# Patient Record
Sex: Female | Born: 1940 | Race: White | Hispanic: No | Smoking: Former smoker
Health system: Southern US, Community
[De-identification: ages and names within clinical notes are randomized; demographics above are authoritative.]

## PROBLEM LIST (undated history)

## (undated) DIAGNOSIS — S82899A Other fracture of unspecified lower leg, initial encounter for closed fracture: Secondary | ICD-10-CM

## (undated) DIAGNOSIS — M1990 Unspecified osteoarthritis, unspecified site: Secondary | ICD-10-CM

## (undated) DIAGNOSIS — F32A Depression, unspecified: Secondary | ICD-10-CM

## (undated) DIAGNOSIS — E079 Disorder of thyroid, unspecified: Secondary | ICD-10-CM

## (undated) DIAGNOSIS — E669 Obesity, unspecified: Secondary | ICD-10-CM

## (undated) DIAGNOSIS — H269 Unspecified cataract: Secondary | ICD-10-CM

## (undated) DIAGNOSIS — I10 Essential (primary) hypertension: Secondary | ICD-10-CM

## (undated) DIAGNOSIS — D509 Iron deficiency anemia, unspecified: Secondary | ICD-10-CM

## (undated) DIAGNOSIS — C50919 Malignant neoplasm of unspecified site of unspecified female breast: Secondary | ICD-10-CM

## (undated) DIAGNOSIS — R7303 Prediabetes: Principal | ICD-10-CM

## (undated) DIAGNOSIS — G4733 Obstructive sleep apnea (adult) (pediatric): Secondary | ICD-10-CM

## (undated) DIAGNOSIS — M810 Age-related osteoporosis without current pathological fracture: Secondary | ICD-10-CM

## (undated) DIAGNOSIS — R0600 Dyspnea, unspecified: Secondary | ICD-10-CM

## (undated) DIAGNOSIS — F419 Anxiety disorder, unspecified: Secondary | ICD-10-CM

## (undated) DIAGNOSIS — R0609 Other forms of dyspnea: Secondary | ICD-10-CM

## (undated) DIAGNOSIS — M8580 Other specified disorders of bone density and structure, unspecified site: Secondary | ICD-10-CM

## (undated) DIAGNOSIS — T7840XA Allergy, unspecified, initial encounter: Secondary | ICD-10-CM

## (undated) DIAGNOSIS — K219 Gastro-esophageal reflux disease without esophagitis: Secondary | ICD-10-CM

## (undated) HISTORY — PX: COLONOSCOPY W/ POLYPECTOMY: SHX1380

## (undated) HISTORY — DX: Obesity, unspecified: E66.9

## (undated) HISTORY — DX: Age-related osteoporosis without current pathological fracture: M81.0

## (undated) HISTORY — PX: BREAST BIOPSY: SHX20

## (undated) HISTORY — DX: Essential (primary) hypertension: I10

## (undated) HISTORY — DX: Allergy, unspecified, initial encounter: T78.40XA

## (undated) HISTORY — DX: Prediabetes: R73.03

## (undated) HISTORY — DX: Depression, unspecified: F32.A

## (undated) HISTORY — DX: Other specified disorders of bone density and structure, unspecified site: M85.80

## (undated) HISTORY — DX: Unspecified cataract: H26.9

## (undated) HISTORY — DX: Anxiety disorder, unspecified: F41.9

## (undated) HISTORY — PX: JOINT REPLACEMENT: SHX530

## (undated) HISTORY — DX: Obstructive sleep apnea (adult) (pediatric): G47.33

## (undated) HISTORY — PX: FRACTURE SURGERY: SHX138

## (undated) HISTORY — DX: Unspecified osteoarthritis, unspecified site: M19.90

## (undated) HISTORY — PX: MASTECTOMY: SHX3

---
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
## 2005-10-02 ENCOUNTER — Encounter: Payer: Self-pay | Admitting: Internal Medicine

## 2006-04-06 ENCOUNTER — Ambulatory Visit (HOSPITAL_COMMUNITY): Admission: RE | Admit: 2006-04-06 | Discharge: 2006-04-06 | Payer: Self-pay | Admitting: Internal Medicine

## 2006-04-06 ENCOUNTER — Ambulatory Visit: Payer: Self-pay | Admitting: Internal Medicine

## 2006-04-09 HISTORY — PX: TOTAL HIP ARTHROPLASTY: SHX124

## 2006-04-13 ENCOUNTER — Ambulatory Visit: Payer: Self-pay | Admitting: Internal Medicine

## 2006-05-03 ENCOUNTER — Inpatient Hospital Stay (HOSPITAL_COMMUNITY): Admission: RE | Admit: 2006-05-03 | Discharge: 2006-05-07 | Payer: Self-pay | Admitting: Orthopedic Surgery

## 2006-05-03 ENCOUNTER — Encounter (INDEPENDENT_AMBULATORY_CARE_PROVIDER_SITE_OTHER): Payer: Self-pay | Admitting: Specialist

## 2006-12-11 ENCOUNTER — Ambulatory Visit: Payer: Self-pay | Admitting: Internal Medicine

## 2006-12-13 ENCOUNTER — Ambulatory Visit: Payer: Self-pay | Admitting: Internal Medicine

## 2006-12-13 LAB — CONVERTED CEMR LAB
ALT: 20 U/L
AST: 22 U/L
Albumin: 3.5 g/dL
Alkaline Phosphatase: 56 U/L
BUN: 19 mg/dL
Basophils Absolute: 0.1 K/uL
Basophils Relative: 0.9 %
Bilirubin, Direct: 0.1 mg/dL
CO2: 28 meq/L
Calcium: 10.3 mg/dL
Chloride: 109 meq/L
Cholesterol: 203 mg/dL
Creatinine, Ser: 1 mg/dL
Direct LDL: 110.4 mg/dL
Eosinophils Absolute: 0.1 K/uL
Eosinophils Relative: 1.6 %
GFR calc Af Amer: 72 mL/min
GFR calc non Af Amer: 60 mL/min
Glucose, Bld: 114 mg/dL — ABNORMAL HIGH
HCT: 35.4 % — ABNORMAL LOW
HDL: 61 mg/dL
Hemoglobin: 12.4 g/dL
Hgb A1c MFr Bld: 5.9 %
Lymphocytes Relative: 30.5 %
MCHC: 35 g/dL
MCV: 94.7 fL
Monocytes Absolute: 0.6 K/uL
Monocytes Relative: 6.1 %
Neutro Abs: 5.5 K/uL
Neutrophils Relative %: 60.9 %
Platelets: 268 K/uL
Potassium: 4 meq/L
RBC: 3.74 M/uL — ABNORMAL LOW
RDW: 11.8 %
Sodium: 142 meq/L
TSH: 1.41 u[IU]/mL
Total Bilirubin: 0.9 mg/dL
Total CHOL/HDL Ratio: 3.3
Total Protein: 6.8 g/dL
Triglycerides: 97 mg/dL
VLDL: 19 mg/dL
WBC: 9.1 10*3/microliter

## 2007-01-22 ENCOUNTER — Ambulatory Visit: Payer: Self-pay | Admitting: Internal Medicine

## 2007-01-22 LAB — CONVERTED CEMR LAB
CO2: 26 meq/L (ref 19–32)
GFR calc Af Amer: 81 mL/min
GFR calc non Af Amer: 67 mL/min
Glucose, Bld: 103 mg/dL — ABNORMAL HIGH (ref 70–99)
Potassium: 4.3 meq/L (ref 3.5–5.1)

## 2007-12-03 ENCOUNTER — Ambulatory Visit: Payer: Self-pay | Admitting: Internal Medicine

## 2007-12-03 LAB — CONVERTED CEMR LAB
AST: 25 units/L (ref 0–37)
Albumin: 3.4 g/dL — ABNORMAL LOW (ref 3.5–5.2)
Alkaline Phosphatase: 70 units/L (ref 39–117)
BUN: 16 mg/dL (ref 6–23)
Basophils Absolute: 0.1 10*3/uL (ref 0.0–0.1)
Chloride: 109 meq/L (ref 96–112)
Creatinine, Ser: 1 mg/dL (ref 0.4–1.2)
Eosinophils Absolute: 0.1 10*3/uL (ref 0.0–0.7)
GFR calc Af Amer: 72 mL/min
GFR calc non Af Amer: 59 mL/min
HCT: 34.8 % — ABNORMAL LOW (ref 36.0–46.0)
Hemoglobin, Urine: NEGATIVE
MCHC: 32.2 g/dL (ref 30.0–36.0)
MCV: 94.1 fL (ref 78.0–100.0)
Monocytes Absolute: 0.5 10*3/uL (ref 0.1–1.0)
Neutrophils Relative %: 64.5 % (ref 43.0–77.0)
Platelets: 337 10*3/uL (ref 150–400)
Potassium: 4.4 meq/L (ref 3.5–5.1)
Specific Gravity, Urine: 1.01 (ref 1.000–1.03)
TSH: 1.34 microintl units/mL (ref 0.35–5.50)
Total Bilirubin: 0.8 mg/dL (ref 0.3–1.2)
Total CHOL/HDL Ratio: 3.5
Urobilinogen, UA: 0.2 (ref 0.0–1.0)
pH: 6 (ref 5.0–8.0)

## 2007-12-13 ENCOUNTER — Telehealth: Payer: Self-pay | Admitting: Internal Medicine

## 2007-12-13 ENCOUNTER — Ambulatory Visit: Payer: Self-pay | Admitting: Internal Medicine

## 2007-12-13 DIAGNOSIS — I10 Essential (primary) hypertension: Secondary | ICD-10-CM | POA: Insufficient documentation

## 2008-01-14 ENCOUNTER — Ambulatory Visit: Payer: Self-pay | Admitting: Internal Medicine

## 2008-01-14 LAB — CONVERTED CEMR LAB
ALT: 22 units/L (ref 0–35)
AST: 20 units/L (ref 0–37)
Total CK: 58 units/L (ref 7–177)

## 2008-01-24 ENCOUNTER — Ambulatory Visit: Payer: Self-pay | Admitting: Internal Medicine

## 2008-01-24 DIAGNOSIS — E785 Hyperlipidemia, unspecified: Secondary | ICD-10-CM | POA: Insufficient documentation

## 2008-01-24 DIAGNOSIS — D649 Anemia, unspecified: Secondary | ICD-10-CM | POA: Insufficient documentation

## 2008-01-24 LAB — CONVERTED CEMR LAB
Basophils Relative: 1.2 % (ref 0.0–3.0)
Eosinophils Absolute: 0.1 10*3/uL (ref 0.0–0.7)
Eosinophils Relative: 1 % (ref 0.0–5.0)
HCT: 36.3 % (ref 36.0–46.0)
HDL: 57.1 mg/dL (ref >39.0)
Hemoglobin: 12.3 g/dL (ref 12.0–15.0)
MCV: 93.3 fL (ref 78.0–100.0)
Monocytes Absolute: 0.8 10*3/uL (ref 0.1–1.0)
Monocytes Relative: 6.8 % (ref 3.0–12.0)
Neutro Abs: 7.4 10*3/uL (ref 1.4–7.7)
Total CHOL/HDL Ratio: 2.8
WBC: 11.1 10*3/uL — ABNORMAL HIGH (ref 4.5–10.5)

## 2008-05-26 ENCOUNTER — Ambulatory Visit: Payer: Self-pay | Admitting: Internal Medicine

## 2008-09-10 ENCOUNTER — Ambulatory Visit: Payer: Self-pay | Admitting: Internal Medicine

## 2008-09-10 LAB — CONVERTED CEMR LAB
AST: 21 units/L (ref 0–37)
BUN: 21 mg/dL (ref 6–23)
Chloride: 107 meq/L (ref 96–112)
Cholesterol: 147 mg/dL (ref 0–200)
GFR calc Af Amer: 81 mL/min
GFR calc non Af Amer: 67 mL/min
LDL Cholesterol: 76 mg/dL (ref 0–99)
Potassium: 4.3 meq/L (ref 3.5–5.1)
Sodium: 140 meq/L (ref 135–145)
TSH: 0.96 microintl units/mL (ref 0.35–5.50)
Total CHOL/HDL Ratio: 2.5
VLDL: 12 mg/dL (ref 0–40)

## 2008-09-24 ENCOUNTER — Ambulatory Visit: Payer: Self-pay | Admitting: Internal Medicine

## 2008-09-24 DIAGNOSIS — J309 Allergic rhinitis, unspecified: Secondary | ICD-10-CM | POA: Insufficient documentation

## 2008-09-24 DIAGNOSIS — Z853 Personal history of malignant neoplasm of breast: Secondary | ICD-10-CM | POA: Insufficient documentation

## 2008-10-02 ENCOUNTER — Ambulatory Visit: Payer: Self-pay | Admitting: Hematology & Oncology

## 2008-12-29 LAB — CONVERTED CEMR LAB: Pap Smear: NORMAL

## 2009-01-15 ENCOUNTER — Ambulatory Visit: Payer: Self-pay | Admitting: Hematology & Oncology

## 2009-01-18 ENCOUNTER — Encounter: Payer: Self-pay | Admitting: Internal Medicine

## 2009-01-18 LAB — CBC WITH DIFFERENTIAL (CANCER CENTER ONLY)
Eosinophils Absolute: 0.1 10*3/uL (ref 0.0–0.5)
LYMPH%: 34.4 % (ref 14.0–48.0)
MCH: 31.8 pg (ref 26.0–34.0)
MCV: 95 fL (ref 81–101)
MONO%: 6.4 % (ref 0.0–13.0)
Platelets: 317 10*3/uL (ref 145–400)
RBC: 3.41 10*6/uL — ABNORMAL LOW (ref 3.70–5.32)

## 2009-01-19 LAB — COMPREHENSIVE METABOLIC PANEL
Albumin: 4.1 g/dL (ref 3.5–5.2)
BUN: 19 mg/dL (ref 6–23)
CO2: 23 mEq/L (ref 19–32)
Calcium: 10.1 mg/dL (ref 8.4–10.5)
Chloride: 106 mEq/L (ref 96–112)
Glucose, Bld: 111 mg/dL — ABNORMAL HIGH (ref 70–99)
Potassium: 3.6 mEq/L (ref 3.5–5.3)
Total Protein: 6.7 g/dL (ref 6.0–8.3)

## 2009-01-19 LAB — VITAMIN D 25 HYDROXY (VIT D DEFICIENCY, FRACTURES): Vit D, 25-Hydroxy: 47 ng/mL (ref 30–89)

## 2009-01-22 ENCOUNTER — Ambulatory Visit (HOSPITAL_COMMUNITY): Admission: RE | Admit: 2009-01-22 | Discharge: 2009-01-22 | Payer: Self-pay | Admitting: Hematology & Oncology

## 2009-02-05 ENCOUNTER — Ambulatory Visit: Payer: Self-pay | Admitting: Internal Medicine

## 2009-03-16 ENCOUNTER — Telehealth: Payer: Self-pay | Admitting: Internal Medicine

## 2009-03-23 ENCOUNTER — Ambulatory Visit: Payer: Self-pay | Admitting: Hematology & Oncology

## 2009-03-24 ENCOUNTER — Encounter: Payer: Self-pay | Admitting: Internal Medicine

## 2009-03-24 LAB — CBC WITH DIFFERENTIAL (CANCER CENTER ONLY)
BASO%: 0.7 % (ref 0.0–2.0)
EOS%: 1.4 % (ref 0.0–7.0)
HCT: 35.3 % (ref 34.8–46.6)
HGB: 11.9 g/dL (ref 11.6–15.9)
LYMPH#: 2.9 10*3/uL (ref 0.9–3.3)
MCHC: 33.6 g/dL (ref 32.0–36.0)
MONO#: 0.6 10*3/uL (ref 0.1–0.9)
NEUT#: 6.6 10*3/uL — ABNORMAL HIGH (ref 1.5–6.5)
NEUT%: 64.3 % (ref 39.6–80.0)
RDW: 11.3 % (ref 10.5–14.6)
WBC: 10.3 10*3/uL — ABNORMAL HIGH (ref 3.9–10.0)

## 2009-03-24 LAB — CHCC SATELLITE - SMEAR

## 2009-03-26 LAB — PROTEIN ELECTROPHORESIS, SERUM
Alpha-1-Globulin: 4.6 % (ref 2.9–4.9)
Alpha-2-Globulin: 12.2 % — ABNORMAL HIGH (ref 7.1–11.8)
Beta 2: 4.7 % (ref 3.2–6.5)
Beta Globulin: 6 % (ref 4.7–7.2)
Gamma Globulin: 15.9 % (ref 11.1–18.8)

## 2009-03-26 LAB — ERYTHROPOIETIN: Erythropoietin: 8.7 m[IU]/mL (ref 2.6–34.0)

## 2009-03-26 LAB — RETICULOCYTES (CHCC): Retic Ct Pct: 1.2 % (ref 0.4–3.1)

## 2009-05-31 ENCOUNTER — Telehealth: Payer: Self-pay | Admitting: Internal Medicine

## 2009-06-29 ENCOUNTER — Ambulatory Visit: Payer: Self-pay | Admitting: Hematology & Oncology

## 2009-07-01 ENCOUNTER — Encounter: Payer: Self-pay | Admitting: Internal Medicine

## 2009-07-01 LAB — CBC WITH DIFFERENTIAL (CANCER CENTER ONLY)
BASO%: 0.5 % (ref 0.0–2.0)
EOS%: 1.6 % (ref 0.0–7.0)
LYMPH#: 2.9 10*3/uL (ref 0.9–3.3)
LYMPH%: 31.3 % (ref 14.0–48.0)
MCHC: 34.4 g/dL (ref 32.0–36.0)
MONO#: 0.6 10*3/uL (ref 0.1–0.9)
Platelets: 304 10*3/uL (ref 145–400)
RDW: 11.6 % (ref 10.5–14.6)
WBC: 9.4 10*3/uL (ref 3.9–10.0)

## 2009-07-01 LAB — COMPREHENSIVE METABOLIC PANEL
AST: 22 U/L (ref 0–37)
Albumin: 4 g/dL (ref 3.5–5.2)
BUN: 25 mg/dL — ABNORMAL HIGH (ref 6–23)
Calcium: 10.5 mg/dL (ref 8.4–10.5)
Chloride: 106 mEq/L (ref 96–112)
Creatinine, Ser: 1.04 mg/dL (ref 0.40–1.20)
Glucose, Bld: 105 mg/dL — ABNORMAL HIGH (ref 70–99)
Potassium: 4.2 mEq/L (ref 3.5–5.3)

## 2009-07-01 LAB — VITAMIN D 25 HYDROXY (VIT D DEFICIENCY, FRACTURES): Vit D, 25-Hydroxy: 44 ng/mL (ref 30–89)

## 2009-07-07 ENCOUNTER — Telehealth: Payer: Self-pay | Admitting: Internal Medicine

## 2009-07-10 HISTORY — PX: EYE SURGERY: SHX253

## 2009-11-26 ENCOUNTER — Ambulatory Visit: Payer: Self-pay | Admitting: Internal Medicine

## 2009-11-26 DIAGNOSIS — M8580 Other specified disorders of bone density and structure, unspecified site: Secondary | ICD-10-CM

## 2009-11-26 HISTORY — DX: Other specified disorders of bone density and structure, unspecified site: M85.80

## 2009-11-26 LAB — CONVERTED CEMR LAB
AST: 21 units/L (ref 0–37)
Alkaline Phosphatase: 56 units/L (ref 39–117)
BUN: 27 mg/dL — ABNORMAL HIGH (ref 6–23)
Calcium: 10.6 mg/dL — ABNORMAL HIGH (ref 8.4–10.5)
Chloride: 105 meq/L (ref 96–112)
Creatinine, Ser: 1.16 mg/dL (ref 0.40–1.20)
Indirect Bilirubin: 0.6 mg/dL (ref 0.0–0.9)
LDL Cholesterol: 73 mg/dL (ref 0–99)
Total Bilirubin: 0.8 mg/dL (ref 0.3–1.2)
Total Protein: 7.4 g/dL (ref 6.0–8.3)
Triglycerides: 69 mg/dL (ref <150)

## 2009-11-29 ENCOUNTER — Encounter: Payer: Self-pay | Admitting: Internal Medicine

## 2009-11-29 ENCOUNTER — Telehealth: Payer: Self-pay | Admitting: Internal Medicine

## 2009-12-03 ENCOUNTER — Encounter: Payer: Self-pay | Admitting: Internal Medicine

## 2009-12-14 ENCOUNTER — Ambulatory Visit: Payer: Self-pay | Admitting: Hematology & Oncology

## 2009-12-17 ENCOUNTER — Encounter: Payer: Self-pay | Admitting: Internal Medicine

## 2009-12-17 LAB — COMPREHENSIVE METABOLIC PANEL
BUN: 17 mg/dL (ref 6–23)
CO2: 21 mEq/L (ref 19–32)
Creatinine, Ser: 0.95 mg/dL (ref 0.40–1.20)
Glucose, Bld: 102 mg/dL — ABNORMAL HIGH (ref 70–99)
Total Bilirubin: 0.6 mg/dL (ref 0.3–1.2)
Total Protein: 7.1 g/dL (ref 6.0–8.3)

## 2009-12-17 LAB — CBC WITH DIFFERENTIAL (CANCER CENTER ONLY)
BASO%: 0.8 % (ref 0.0–2.0)
EOS%: 2.2 % (ref 0.0–7.0)
LYMPH%: 27.4 % (ref 14.0–48.0)
MCH: 31.8 pg (ref 26.0–34.0)
MCV: 94 fL (ref 81–101)
MONO%: 6.4 % (ref 0.0–13.0)
Platelets: 323 10*3/uL (ref 145–400)
RDW: 11.1 % (ref 10.5–14.6)

## 2009-12-17 LAB — FERRITIN: Ferritin: 154 ng/mL (ref 10–291)

## 2009-12-17 LAB — VITAMIN D 25 HYDROXY (VIT D DEFICIENCY, FRACTURES): Vit D, 25-Hydroxy: 62 ng/mL (ref 30–89)

## 2009-12-17 LAB — CHCC SATELLITE - SMEAR

## 2010-02-02 ENCOUNTER — Encounter: Payer: Self-pay | Admitting: Internal Medicine

## 2010-02-02 LAB — CONVERTED CEMR LAB
BUN: 19 mg/dL (ref 6–23)
Chloride: 104 meq/L (ref 96–112)
Creatinine, Ser: 0.93 mg/dL (ref 0.40–1.20)
Glucose, Bld: 110 mg/dL — ABNORMAL HIGH (ref 70–99)
Potassium: 4.3 meq/L (ref 3.5–5.3)

## 2010-02-07 ENCOUNTER — Encounter: Payer: Self-pay | Admitting: Internal Medicine

## 2010-02-09 ENCOUNTER — Encounter: Payer: Self-pay | Admitting: Internal Medicine

## 2010-02-10 ENCOUNTER — Encounter: Payer: Self-pay | Admitting: Internal Medicine

## 2010-06-16 ENCOUNTER — Ambulatory Visit: Payer: Self-pay | Admitting: Hematology & Oncology

## 2010-06-17 ENCOUNTER — Encounter: Payer: Self-pay | Admitting: Internal Medicine

## 2010-06-17 LAB — CBC WITH DIFFERENTIAL (CANCER CENTER ONLY)
BASO#: 0.1 10*3/uL (ref 0.0–0.2)
BASO%: 0.7 % (ref 0.0–2.0)
EOS%: 1.5 % (ref 0.0–7.0)
HGB: 12.1 g/dL (ref 11.6–15.9)
LYMPH#: 2.6 10*3/uL (ref 0.9–3.3)
MCHC: 33.4 g/dL (ref 32.0–36.0)
NEUT#: 5.5 10*3/uL (ref 1.5–6.5)

## 2010-06-18 LAB — COMPREHENSIVE METABOLIC PANEL
ALT: 17 U/L (ref 0–35)
AST: 22 U/L (ref 0–37)
Albumin: 4.1 g/dL (ref 3.5–5.2)
Alkaline Phosphatase: 62 U/L (ref 39–117)
Glucose, Bld: 95 mg/dL (ref 70–99)
Potassium: 3.9 mEq/L (ref 3.5–5.3)
Sodium: 136 mEq/L (ref 135–145)
Total Protein: 6.9 g/dL (ref 6.0–8.3)

## 2010-07-27 ENCOUNTER — Telehealth: Payer: Self-pay | Admitting: Internal Medicine

## 2010-08-02 ENCOUNTER — Encounter: Payer: Self-pay | Admitting: Internal Medicine

## 2010-08-09 NOTE — Progress Notes (Signed)
Summary: Lab Results  Phone Note Outgoing Call   Summary of Call: call pt - calcium level slightly elevated.  I suggest we repeat bmet in 2 months Initial call taken by: D. [name] DO,  Nov 29, 2009 6:10 PM  Follow-up for Phone Call        attempted to contact patient at [phone], no answer, voice message left for patient to return call Follow-up by: Glendell Docker CMA,  Nov 30, 2009 8:37 AM  Additional Follow-up for Phone Call Additional follow up Details #1::        Also, received refill request from [facility] for Benicar HCT.  This was replaced with Losartan-HCTZ on 11/26/09.  Form faxed to [facility] to D/C Benicar HCT. Verify w/pt that they should not take Benicar HCT.  Left message on machine to return my call.  Mervin Kung CMA  Nov 30, 2009 1:33 PM   New Problems: HYPERCALCEMIA (ICD-275.42)   Additional Follow-up for Phone Call Additional follow up Details #2::    attempted to contact patient at  [phone] , no answer, voice message left for patient to return call regarding lab results. Follow-up by: Glendell Docker CMA,  Dec 01, 2009 9:08 AM  Additional Follow-up for Phone Call Additional follow up Details #3:: Details for Additional Follow-up Action Taken: call placed to patient at [phone], no answer, detaied voice message left for patient informing patient per Dr Artist Pais instructions Additional Follow-up by: Glendell Docker CMA,  Dec 03, 2009 10:10 AM  New Problems: HYPERCALCEMIA (ICD-275.42)  Appended Document: Lab Results Pt returned our call and was notified per Dr. Olegario Messier instructions.  Pt verified that Benicar HCT has been changed. Pt aware to come to [facility] office for blood work in 2 months.  Order sent to lab.

## 2010-08-09 NOTE — Letter (Signed)
[facility]   Imported By: Lanelle Bal 07/28/2009 11:04:40  _____________________________________________________________________  External Attachment:    Type:   Image     Comment:   External Document

## 2010-08-09 NOTE — Medication Information (Signed)
Summary: Simvasatin Change/[facility]  Simvasatin Change/[facility]   Imported By: Lanelle Bal 03/02/2010 09:48:40  _____________________________________________________________________  External Attachment:    Type:   Image     Comment:   External Document

## 2010-08-09 NOTE — Miscellaneous (Signed)
Summary: BMP ORDER  Clinical Lists Changes  Orders: Added new Test order of T-Basic Metabolic Panel [phone]) - Signed

## 2010-08-09 NOTE — Medication Information (Signed)
Summary: Drug Interaction Review-Simvastatin with Amlodipine  Drug Interaction Review-Simvastatin with Amlodipine   Imported By: Maryln Gottron 02/15/2010 12:13:33  _____________________________________________________________________  External Attachment:    Type:   Image     Comment:   External Document

## 2010-08-09 NOTE — Letter (Signed)
[facility]   Imported By: Lanelle Bal 01/07/2010 08:11:09  _____________________________________________________________________  External Attachment:    Type:   Image     Comment:   External Document

## 2010-08-09 NOTE — Miscellaneous (Signed)
Summary: Medication Change  Clinical Lists Changes  Medications: Changed medication from SIMVASTATIN 40 MG TABS (SIMVASTATIN) one by mouth qpm to SIMVASTATIN 10 MG TABS (SIMVASTATIN) Take 1 tablet by mouth once a day - Signed Rx of SIMVASTATIN 10 MG TABS (SIMVASTATIN) Take 1 tablet by mouth once a day;  #90 x 3;  Signed;  Entered by: Glendell Docker CMA;  Authorized by: D. [name] DO;  Method used: Telephoned to [facility]*, , ,   , Ph: [phone], Fax: [phone]    Prescriptions: SIMVASTATIN 10 MG TABS (SIMVASTATIN) Take 1 tablet by mouth once a day  #90 x 3   Entered by:   Glendell Docker CMA   Authorized by:   D. [name] DO   Signed by:   Glendell Docker CMA on 02/09/2010   Method used:   Telephoned to ...       [facility]* (retail)             ,          Ph: [phone]       Fax: [phone]   RxID:   [id number]

## 2010-08-09 NOTE — Assessment & Plan Note (Signed)
Summary: Follow up/DT   Vital Signs:  Patient profile:   70 year old female Height:      67 inches Weight:      235 pounds BMI:     36.94 O2 Sat:      99 % on Room air Temp:     97.6 degrees F oral Pulse rate:   62 / minute Pulse rhythm:   regular Resp:     18 per minute BP sitting:   122 / 76  (left arm) Cuff size:   large  Vitals Entered By: Glendell Docker CMA (Nov 26, 2009 8:44 AM)  O2 Flow:  Room air  Contraindications/Deferment of Procedures/Staging:    Test/Procedure: Mammogram    Reason for deferment: bilateral mastectomy  CC: Rm 3- follow up   Primary Care Provider:  Dondra Spry DO  CC:  Rm 3- follow up.  History of Present Illness: 70 y/o white female with hx of obesity, htn, breast cancer for routine follow up no significant interval hx she is followed by Dr. Myna Hidalgo  some weight gain it has been tough year since her husband was diagnosed with lymphoma  htn - stable.  would like to switch to generics if possible  Preventive Screening-Counseling & Management  Alcohol-Tobacco     Smoking Status: quit  Allergies: 1)  ! 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  Had a history of CHF, diabetes and alcoholism.  Mother deceased at age 31 secondary to breast cancer.       Social History: Retired The patient is married and has one child and two grandchildren that live in the area.   She drinks wine with dinner.  She quit smoking in 1976, smoked for approximately 10 years.     Moved to [place] from [place], [place]  Review of Systems       The patient complains of weight gain.  The patient denies chest pain, syncope, dyspnea on exertion, abdominal pain, melena, hematochezia, and severe indigestion/heartburn.     Physical Exam  General:  alert, obese Eyes:  pupils equal, pupils round, and pupils reactive to light.   Ears:  R ear normal and L ear normal.   Mouth:  pharynx pink and moist.   Neck:  supple and no masses.  no carotid bruits.   Lungs:  normal respiratory effort, normal breath sounds, no crackles, and no wheezes.   Heart:  normal rate, regular rhythm, and no gallop.   Abdomen:  soft, non-tender, normal bowel sounds, no masses, no hepatomegaly, and no splenomegaly.   Extremities:  trace left pedal edema and trace right pedal edema.   Neurologic:  cranial nerves II-XII intact and gait normal.   Psych:  normally interactive, good eye contact, not anxious appearing, and not depressed appearing.     Impression & Recommendations:  Problem # 1:  HYPERTENSION (ICD-401.9) well controlled.  switch benicar to losartan.  pt to monitor BP at home  The following medications were removed from the medication list:    Benicar Hct 20-12.5 Mg Tabs (Olmesartan medoxomil-hctz) ..... One by mouth once daily Her updated medication list for this problem includes:    Bisoprolol Fumarate 10 Mg Tabs (Bisoprolol fumarate) .Marland Kitchen... Take 1 tablet by mouth once a day  Amlodipine Besylate 2.5 Mg Tabs (Amlodipine besylate) .Marland Kitchen... 1 tablet  by mouth once daily    Losartan Potassium-hctz 50-12.5 Mg Tabs (Losartan potassium-hctz) ..... One by mouth once daily  Orders: T-Basic Metabolic Panel [id number])  BP today: 122/76 Prior BP: 112/60 (02/05/2009)  Labs Reviewed: K+: 4.3 (09/10/2008) Creat: : 0.9 (09/10/2008)   Chol: 147 (09/10/2008)   HDL: 59.2 (09/10/2008)   LDL: 76 (09/10/2008)   TG: 59 (09/10/2008)  Problem # 2:  ADENOCARCINOMA, BREAST, HX OF (ICD-V10.3) Assessment: Unchanged followed by Dr. Myna Hidalgo  Problem # 3:  OSTEOPOROSIS (ICD-733.00) tolerating boniva.    Her updated medication list for this problem includes:    Boniva 150 Mg Tabs (Ibandronate sodium) ..... One by mouth once a  month  Complete Medication List: 1)  Bisoprolol Fumarate 10 Mg Tabs (Bisoprolol fumarate) .... Take 1 tablet by mouth once a day 2)  Fish Oil 1000 Mg Caps (Omega-3 fatty acids) .... Take 1 tablet by mouth two times a day 3)  Gelatin Capsule Empty Caps (Gelatin capsules (empty)) .... Take 1 tablet by mouth two times a day 4)  Amlodipine Besylate 2.5 Mg Tabs (Amlodipine besylate) .Marland Kitchen.. 1 tablet  by mouth once daily 5)  Simvastatin 40 Mg Tabs (Simvastatin) .... One by mouth qpm 6)  Multivitamins Tabs (Multiple vitamin) .... Take 1 tablet by mouth once a day 7)  Fexofenadine Hcl 180 Mg Tabs (Fexofenadine hcl) .... Take 1 tablet by mouth once a day as needed 8)  Boniva 150 Mg Tabs (Ibandronate sodium) .... One by mouth once a month 9)  Vitamin D3 1000 Unit Caps (Cholecalciferol) .... Take 1 capsule by mouth once a day 10)  Zostavax 82956 Unt/0.24ml Solr (Zoster vaccine live) .... Administer vaccine x 1 11)  Losartan Potassium-hctz 50-12.5 Mg Tabs (Losartan potassium-hctz) .... One by mouth once daily  Other Orders: T-Hepatic Function [id number]) T-Lipid Profile [id number]) T-TSH [phone])  Patient Instructions: 1)  Please schedule a follow-up appointment in 1 year. 2)  [email or web address] Prescriptions: LOSARTAN POTASSIUM-HCTZ 50-12.5 MG TABS (LOSARTAN POTASSIUM-HCTZ) one by mouth once daily  #90 x 3   Entered and Authorized by:   D. [name] DO   Signed by:   D. [name] DO on 11/26/2009   Method used:   Print then Give to Patient   RxID:   [id number] BONIVA 150 MG TABS (IBANDRONATE SODIUM) one by mouth once a month  #3 x 4   Entered and Authorized by:   D. [name] DO   Signed by:   D. [name] DO on 11/26/2009   Method used:   Print then Give to Patient   RxID:   [id number] FEXOFENADINE HCL 180 MG TABS (FEXOFENADINE HCL) Take 1 tablet by mouth once a day as needed  #90 x 3   Entered and Authorized by:   D. [name] DO   Signed by:   D.  [name] DO on 11/26/2009   Method used:   Print then Give to Patient   RxID:   [id number] SIMVASTATIN 40 MG TABS (SIMVASTATIN) one by mouth qpm  #90 x 3   Entered and Authorized by:   D. [name] DO   Signed by:   D. [name] DO on 11/26/2009   Method used:   Print then Give to Patient   RxID:   [id number] AMLODIPINE BESYLATE 2.5 MG  TABS (AMLODIPINE BESYLATE) 1 tablet  by mouth once daily  #90 x 3   Entered and Authorized by:  Dondra Spry DO   Signed by:   D. [name] DO on 11/26/2009   Method used:   Print then Give to Patient   RxID:   [id number] BISOPROLOL FUMARATE 10 MG  TABS (BISOPROLOL FUMARATE) Take 1 tablet by mouth once a day  #90 x 3   Entered and Authorized by:   D. [name] DO   Signed by:   D. [name] DO on 11/26/2009   Method used:   Print then Give to Patient   RxID:   [id number] BONIVA 150 MG TABS (IBANDRONATE SODIUM) one by mouth once a month  #3 x 4   Entered and Authorized by:   D. [name] DO   Signed by:   D. [name] DO on 11/26/2009   Method used:   Electronically to        [facility]* (mail-order)             ,          Ph: [phone]       Fax: [phone]   RxID:   [id number] LOSARTAN POTASSIUM-HCTZ 50-12.5 MG TABS (LOSARTAN POTASSIUM-HCTZ) one by mouth once daily  #90 x 3   Entered and Authorized by:   D. [name] DO   Signed by:   D. [name] DO on 11/26/2009   Method used:   Electronically to        [facility]* (mail-order)             ,          Ph: [phone]       Fax: [phone]   RxID:   [id number] FEXOFENADINE HCL 180 MG TABS (FEXOFENADINE HCL) Take 1 tablet by mouth once a day as needed  #90 x 3   Entered and Authorized by:   D. [name] DO   Signed by:   D. [name] DO on 11/26/2009   Method used:   Electronically to        [facility]* (mail-order)             ,          Ph: [phone]       Fax: [phone]   RxID:   [id number] SIMVASTATIN 40 MG  TABS (SIMVASTATIN) one by mouth qpm  #90 x 3   Entered and Authorized by:   D. [name] DO   Signed by:   D. [name] DO on 11/26/2009   Method used:   Electronically to        [facility]* (mail-order)             ,          Ph: [phone]       Fax: [phone]   RxID:   [id number] AMLODIPINE BESYLATE 2.5 MG  TABS (AMLODIPINE BESYLATE) 1 tablet  by mouth once daily  #90 x 3   Entered and Authorized by:   D. [name] DO   Signed by:   D. [name] DO on 11/26/2009   Method used:   Electronically to        [facility]* (mail-order)             ,          Ph: [phone]       Fax: [phone]   RxID:   [id number] BISOPROLOL FUMARATE 10 MG  TABS (BISOPROLOL FUMARATE) Take 1 tablet by mouth once a  day  #90 x 3   Entered and Authorized by:   D. [name] DO   Signed by:   D. [name] DO on 11/26/2009   Method used:   Electronically to        [facility]* (mail-order)             ,          Ph: [phone]       Fax: [phone]   RxID:   [id number] UNT/0.65ML SOLR (ZOSTER VACCINE LIVE) administer vaccine x 1  #1 x 0   Entered and Authorized by:   D. [name] DO   Signed by:   D. [name] DO on 11/26/2009   Method used:   Print then Give to Patient   RxID:   [id number] FEXOFENADINE HCL 180 MG TABS (FEXOFENADINE HCL) Take 1 tablet by mouth once a day as needed  #90 x 3   Entered by:   Glendell Docker CMA   Authorized by:   D. [name] DO   Signed by:   D. [name] DO on 11/26/2009   Method used:   Electronically to        [facility]* (mail-order)             ,          Ph: [phone]       Fax: [phone]   RxID:   [id number] AMLODIPINE BESYLATE 2.5 MG  TABS (AMLODIPINE BESYLATE) 1 tablet  by mouth once daily  #90 x 3   Entered by:   Glendell Docker CMA   Authorized by:   D. [name] DO   Signed by:   D. [name] DO on 11/26/2009   Method used:   Electronically to        [facility]*  (mail-order)             ,          Ph: [phone]       Fax: [phone]   RxID:   [id number] BENICAR HCT 20-12.5 MG TABS (OLMESARTAN MEDOXOMIL-HCTZ) one by mouth once daily  #90 x 3   Entered by:   Glendell Docker CMA   Authorized by:   D. [name] DO   Signed by:   D. [name] DO on 11/26/2009   Method used:   Electronically to        [facility]* (mail-order)             ,          Ph: [phone]       Fax: [phone]   RxID:   [id number] BISOPROLOL FUMARATE 10 MG  TABS (BISOPROLOL FUMARATE) Take 1 tablet by mouth once a day  #90 x 3   Entered by:   Glendell Docker CMA   Authorized by:   D. [name] DO   Signed by:   D. [name] DO on 11/26/2009   Method used:   Electronically to        [facility]* (mail-order)             ,          Ph: [phone]       Fax: [phone]   RxID:   [id number]    Preventive Care Screening  Last Flu Shot:    Date:  05/04/2009    Results:  given    Current Allergies (reviewed today): !  SULFA

## 2010-08-09 NOTE — Letter (Signed)
   [place] [facility] [address] [place], [place]  [address]  [place] Phone: [phone]      Nov 29, 2009   Brandon Feazell [address] [place], [place] [address]  RE:  LAB RESULTS  Dear  Ms. OBERLE,  The following is an interpretation of your most recent lab tests.  Please take note of any instructions provided or changes to medications that have resulted from your lab work.  ELECTROLYTES:  Good - no changes needed  KIDNEY FUNCTION TESTS:  Good - no changes needed  LIPID PANEL:  Good - no changes needed Triglyceride: 69   Cholesterol: 157   LDL: 73   HDL: 70   Chol/HDL%:  2.2 Ratio  THYROID STUDIES:  Thyroid studies normal TSH: 0.991       Your calcium level is slightly elevated.   I suggest we repeat your calcium level in 2 months.     Sincerely Yours,    Dr. [name]

## 2010-08-11 NOTE — Progress Notes (Signed)
Summary: [facility] Refills  Phone Note Refill Request Message from:  Fax from Pharmacy on July 27, 2010 2:28 PM  Refills Requested: Medication #1:  LOSARTAN POTASSIUM-HCTZ 50-12.5 MG TABS one by mouth once daily.   Dosage confirmed as above?Dosage Confirmed   Brand Name Necessary? No   Supply Requested: 3 months  Medication #2:  AMLODIPINE BESYLATE 2.5 MG  TABS 1 tablet  by mouth once daily   Dosage confirmed as above?Dosage Confirmed   Brand Name Necessary? No   Supply Requested: 3 months  Medication #3:  SIMVASTATIN 10 MG TABS Take 1 tablet by mouth once a day   Dosage confirmed as above?Dosage Confirmed   Brand Name Necessary? No   Supply Requested: 3 months  Method Requested: Fax to [facility] Next Appointment Scheduled: None Initial call taken by: Glendell Docker CMA,  July 27, 2010 2:29 PM  Follow-up for Phone Call        Rx completed in Dr. Tiajuana Amass Follow-up by: Glendell Docker CMA,  July 27, 2010 2:31 PM    Prescriptions: LOSARTAN POTASSIUM-HCTZ 50-12.5 MG TABS (LOSARTAN POTASSIUM-HCTZ) one by mouth once daily  #90 x 1   Entered by:   Glendell Docker CMA   Authorized by:   D. [name] DO   Signed by:   Glendell Docker CMA on 07/27/2010   Method used:   Faxed to ...       [facility]* (retail)       [address] [place], [place]  [address]       Ph: [phone]       Fax: [phone]   RxID:   [id number] SIMVASTATIN 10 MG TABS (SIMVASTATIN) Take 1 tablet by mouth once a day  #90 x 1   Entered by:   Glendell Docker CMA   Authorized by:   D. [name] DO   Signed by:   Glendell Docker CMA on 07/27/2010   Method used:   Faxed to ...       [facility]* (retail)       [address] [place], [place]  [address]       Ph: [phone]       Fax: [phone]   RxID:   [id number] AMLODIPINE BESYLATE 2.5 MG  TABS (AMLODIPINE BESYLATE) 1 tablet  by mouth once daily  #90 x 1   Entered by:   Glendell Docker CMA   Authorized by:   D.  [name] DO   Signed by:   Glendell Docker CMA on 07/27/2010   Method used:   Faxed to ...       [facility]* (retail)       [address] [place], [place]  [address]       Ph: [phone]       Fax: [phone]   RxID:   [id number]

## 2010-08-11 NOTE — Letter (Signed)
Summary: [facility]  [facility]   Imported By: Lanelle Bal 07/21/2010 11:44:33  _____________________________________________________________________  External Attachment:    Type:   Image     Comment:   External Document

## 2010-08-25 NOTE — Medication Information (Signed)
Summary: Clarification for Losartan/[facility]  Clarification for Losartan/[facility]   Imported By: Lanelle Bal 08/16/2010 12:50:06  _____________________________________________________________________  External Attachment:    Type:   Image     Comment:   External Document

## 2010-11-25 NOTE — Op Note (Signed)
[name], [name]           ACCOUNT NO.:  [id number]   MEDICAL RECORD NO.:  [id number]          PATIENT TYPE:  INP   LOCATION:  5012                         FACILITY:  [facility]   PHYSICIAN:  Nadara Mustard, MD     DATE OF BIRTH:  [birth year]   DATE OF PROCEDURE:  05/03/2006  DATE OF DISCHARGE:                                 OPERATIVE REPORT   PREOPERATIVE DIAGNOSIS:  Avascular necrosis, right femoral head.   POSTOPERATIVE DIAGNOSIS:  Avascular necrosis, right femoral head.   PROCEDURE:  Right total hip arthroplasty with DePuy components, a 60 mm cup,  a 53 mm metal head, a size 13 Corail stem.   SURGEON:  Nadara Mustard, MD   ANESTHESIA:  General.   ESTIMATED BLOOD LOSS:  Minimal.   ANTIBIOTICS:  1 gram of Kefzol.   PATHOLOGY:  Femoral head sent to pathology.   DISPOSITION:  To PACU in stable condition.   INDICATIONS FOR PROCEDURE:  The patient is a 70 year old woman with  avascular necrosis of her femoral head.  The patient has failed conservative  care, she has pain with activities of daily living, and presents at this  time for total hip arthroplasty.  Risks and benefits were discussed  including infection, neurovascular injury, persistent pain, dislocation,  DVT, pulmonary embolus, need for additional surgery.  The patient states he  understands and wishes proceed at this time.   DESCRIPTION OF PROCEDURE:  The patient was brought to OR room 4 and  underwent a general anesthetic.  After adequate level of anesthesia,  obtained the patient was placed in the left lateral decubitus position with  the right side up and the right lower extremity was prepped using DuraPrep  and draped into a sterile field and Ioban was used to cover all exposed  skin.  A posterior lateral incision was made.  This was carried down to the  tensor fascia lata which was split, a Charnley retractor was placed.  The  piriformis and short external rotators were tagged, cut and retracted.  The  capsule was transected longitudinally off the femoral neck and retracted  with to retracting  Ethibond sutures.  The femoral head was dislocated and  the neck cut was made 1 cm proximal to the calcar.  Attention was focused on  the acetabulum.  The acetabulum was sequentially reamed to 59 mm and 60 mm  cup was inserted and this was stable with 45 degrees of abduction and 20  degrees of anteversion.  Attention was then focused to the femur.  The  femoral canal was sequentially broached to a size 13 stem and trial 13 with  the trial 53 mm head was tried.  The patient had full flexion to 110  degrees, full abduction, internal rotation of 70 degrees.  The hip was  stable.  The hip was dislocated.  The trial stem and head were removed.  The  wound was irrigated continuously throughout the case with normal saline.  The final Corail stem was inserted size 13 with a 53-mm head.  This was  again reduced, placed through full range of motion and  the hip was stable.  The capsule was reapproximated using the #1 Ethibond, the piriformis and  short external rotators were reapproximated using #1 Ethibond, the tensor  fascia lata  was closed using running #1 Vicryl.  The deep and superficial fascial layers  were closed using 2-0 Vicryl.  The skin was closed using Proximate staples.  The wound was covered with Adaptic orthopedic sponges, ABD dressing, Hypafix  tape and knee immobilizer was applied.  The patient was extubated and taken  to PACU in stable condition.      Nadara Mustard, MD  Electronically Signed     MVD/MEDQ  D:  05/03/2006  T:  05/04/2006  Job:  [id number]

## 2010-11-25 NOTE — Discharge Summary (Signed)
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   PROCEDURE:  Right total hip arthroplasty.   Discharged to home in stable condition with home health physical therapy  with advanced home care with follow-up in the office in two weeks.  The  patient will take Coumadin for DVT prophylaxis for one month.   HISTORY OF PRESENT ILLNESS:  The patient is a 70 year old woman with  avascular necrosis and collapse of the right hip.  The patient has  failed conservative care and has pain with activities of daily living  and presents at this time for a total hip arthroplasty.  The patient's  hospital course was essentially unremarkable.  She underwent a right  total hip arthroplasty with DePuy components, 60 mm cup, 53 mm head with  a size 13 Corail stem.   The patient progressed well with physical therapy.  She was treated with  a Kefzol for infection prophylaxis and Coumadin for DVT prophylaxis.  She progressed well with physical therapy and was discharged to home in  stable condition on 10/29 with followup in the office in two weeks.      Nadara Mustard, MD  Electronically Signed     MVD/MEDQ  D:  06/01/2006  T:  06/01/2006  Job:  [id number]

## 2010-11-25 NOTE — Assessment & Plan Note (Signed)
[facility]                             PRIMARY CARE OFFICE NOTE   NAME:[name], [name]                    MRN:          [id number]  DATE:04/06/2006                            DOB:          [birth year]    CHIEF COMPLAINT:  New patient to practice.   HISTORY OF PRESENT ILLNESS:  The patient is a  70 year old, white female  here to establish primary care.  She and her husband are seeing me as new  patient's today.  They have recently relocated from [place], [place].   PAST MEDICAL HISTORY:  1. Breast cancer discovered in 1993.  She has a family history of breast      cancer and her mother died at age 15.  She elected to undergo bilateral      mastectomy in 1993.  She did not undergo any adjuvant therapy.  The      patient was node negative.  She has had followup with her doctors in      [place] with no report of recurrent disease.  2. Blood pressure that has been somewhat difficult to control and labile.      She takes a combination of benazepril 10 mg once a day and also      bisoprolol/hydrochlorothiazide 10/6.25 mg.  The patient states she has      experienced some dizziness with benazepril and also has had a chronic      cough for 6 months to 1 year.  She has attributed this cough in the      past to her allergy symptoms and has been taking fexofenadine and also      pseudoephedrine.  3. She has been somewhat debilitated by right hip pain.  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  Last x-      ray was performed in April 2007.  The patient states that over the last      several months, right hip pain has increased substantially and Naproxen      does not give her any relief.  The patient has had decreased mobility      and often needs to lean on her husband with ambulation.  4. She has struggled with her weight also, for most of her life, and      currently is at 255 pounds.  She denies any history of  diabetes and      states that her cholesterol has been fairly well-controlled.   SOCIAL HISTORY:  The patient is married and has one child and two  grandchildren that live in the area.  She is retired.   FAMILY HISTORY:  Father deceased at age 22.  Had a history of CHF, diabetes  and alcoholism.  Mother deceased at age 71 secondary to breast cancer.   HABITS:  She drinks wine with dinner.  She quit smoking in 1976, smoked for  approximately 10 years.   PREVENTATIVE CARE HISTORY:  Her last Pap smear was in April 2007.  Her  colonoscopy was completed this year in 2007.  No report of any polyps.   CURRENT MEDICATIONS:  1. Pseudovent 400/120 one a day.  2. Prempro 0.45/1.5 one a day.  3. Bisoprolol/hydrochlorothiazide 10/6.25 mg a day.  4. Flonase nasal spray two sprays each nostril one a day.  5. Naproxen 500 mg one p.o. b.i.d.  6. Fexofenadine 100 mg one a day.  7. Benazepril 10 mg once a day.  8. Multivitamin one a day.  9. Vitamin E 400 IU one a day.  10.Calcium with Vitamin D 1000 mg a day.   ALLERGIES:  SULFA which causes swelling of the face and eyes.   REVIEW OF SYSTEMS:  CONSTITUTIONAL:  No fevers or chills.  HEENT:  No  symptoms.  CARDIOPULMONARY:  The patient denies any chest pain, but does  have some mild dyspnea one exertion.  GASTROINTESTINAL:  Denies any  heartburn, but does have occasional epigastric discomfort.  No report of  diarrhea, dark stools or blood in her stools.  GENITOURINARY:  She does  report having UTIs in the recent past.   PHYSICAL EXAMINATION:  VITAL SIGNS:  Height 5 feet 8 inches, weight 255  pounds, temperature 97.3, pulse 75 and BP 148/78 in the left arm and seated  position.  GENERAL:  The patient is a very pleasant, somewhat obese, 70 year old, white  female in no apparent distress.  HEENT:  Normocephalic, atraumatic.  Pupils equal round and reactive to light  bilaterally.  Extraocular movements intact.  The patient was anicteric.   Conjunctivae within normal limits.  Auditory canals inspected and ears are  clear bilaterally.  Oropharyngeal exam was unremarkable.  NECK:  Supple with no adenopathy, carotid bruit or thyromegaly.  CHEST:  Normal expiratory effort.  Clear to auscultation bilaterally.  Evidence of bilateral mastectomy.  CARDIAC:  Regular rate and rhythm with no significant murmurs, rubs or  gallops appreciated.  ABDOMEN:  Protuberant and nontender, positive bowel sounds.  No  organomegaly.  MUSCULOSKELETAL:  The patient had some lower extremity edema.  The patient  had significant pain to palpation of her right hip by her trochanter and  pain with flexion/extension of her right leg and had severe pain with  internal and external rotation.  SKIN:  Warm and dry.  NEUROLOGIC:  Cranial nerves 2-12 grossly intact.  Nonfocal.   IMPRESSION:  1. Chronic right hip pain with history of osteoarthritis, questionable hip      bursitis.  2. 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  3. Chronic nonsteroidal anti-inflammatory drug use.  High risk for      nonsteroidal anti-inflammatory drug gastritis.  4. History of allergic rhinitis.  5. History of breast cancer in remission.  6. Morbid obesity.  7. Health maintenance.   RECOMMENDATIONS:  The patient will be sent for repeat x-ray of right hip.  The patient likely has worsening right hip osteoarthritis and will need to  be referred to orthopedic surgeon.  We will discontinue her benazepril and  the patient was given samples of Diovan 80 mg once a day.  Hopefully, this  will help resolve her cough.  The patient is to minimize her NSAID use and  was given a prescription for Vicodin 5/500 to be taken every 6-8 hours as  needed for pain.  In addition, she was started on omeprazole 20 mg once a  day to protect her stomach while taking NSAIDs.  Followup time is in approximately 1 week.       Barbette Hair. Artist Pais,  DO      RDY/MedQ  DD:  04/06/2006  DT:  04/09/2006  Job #:  [id number]

## 2010-12-01 ENCOUNTER — Encounter: Payer: Self-pay | Admitting: Internal Medicine

## 2010-12-09 ENCOUNTER — Encounter: Payer: Self-pay | Admitting: Internal Medicine

## 2010-12-16 ENCOUNTER — Other Ambulatory Visit: Payer: Self-pay | Admitting: Hematology & Oncology

## 2010-12-16 ENCOUNTER — Encounter (HOSPITAL_BASED_OUTPATIENT_CLINIC_OR_DEPARTMENT_OTHER): Payer: Medicare Other | Admitting: Hematology & Oncology

## 2010-12-16 ENCOUNTER — Other Ambulatory Visit: Payer: Self-pay | Admitting: Family

## 2010-12-16 DIAGNOSIS — D0590 Unspecified type of carcinoma in situ of unspecified breast: Secondary | ICD-10-CM

## 2010-12-16 DIAGNOSIS — D649 Anemia, unspecified: Secondary | ICD-10-CM

## 2010-12-16 DIAGNOSIS — Z780 Asymptomatic menopausal state: Secondary | ICD-10-CM

## 2010-12-16 DIAGNOSIS — D638 Anemia in other chronic diseases classified elsewhere: Secondary | ICD-10-CM

## 2010-12-16 DIAGNOSIS — Z853 Personal history of malignant neoplasm of breast: Secondary | ICD-10-CM

## 2010-12-16 LAB — CBC WITH DIFFERENTIAL (CANCER CENTER ONLY)
BASO#: 0.1 10*3/uL (ref 0.0–0.2)
Eosinophils Absolute: 0.6 10*3/uL — ABNORMAL HIGH (ref 0.0–0.5)
HCT: 33 % — ABNORMAL LOW (ref 34.8–46.6)
HGB: 11 g/dL — ABNORMAL LOW (ref 11.6–15.9)
LYMPH%: 21.7 % (ref 14.0–48.0)
MCH: 28.7 pg (ref 26.0–34.0)
MCV: 86 fL (ref 81–101)
MONO#: 1.1 10*3/uL — ABNORMAL HIGH (ref 0.1–0.9)
MONO%: 8.1 % (ref 0.0–13.0)
NEUT%: 65.3 % (ref 39.6–80.0)
Platelets: 362 10*3/uL (ref 145–400)
RBC: 3.83 10*6/uL (ref 3.70–5.32)

## 2010-12-28 ENCOUNTER — Encounter: Payer: Self-pay | Admitting: Family

## 2010-12-28 ENCOUNTER — Encounter: Payer: Medicare Other | Admitting: Internal Medicine

## 2010-12-28 ENCOUNTER — Other Ambulatory Visit: Payer: Self-pay | Admitting: Family

## 2010-12-28 ENCOUNTER — Ambulatory Visit (INDEPENDENT_AMBULATORY_CARE_PROVIDER_SITE_OTHER): Payer: Medicare Other | Admitting: Family

## 2010-12-28 DIAGNOSIS — J309 Allergic rhinitis, unspecified: Secondary | ICD-10-CM

## 2010-12-28 DIAGNOSIS — M810 Age-related osteoporosis without current pathological fracture: Secondary | ICD-10-CM

## 2010-12-28 DIAGNOSIS — I10 Essential (primary) hypertension: Secondary | ICD-10-CM

## 2010-12-28 DIAGNOSIS — E785 Hyperlipidemia, unspecified: Secondary | ICD-10-CM

## 2010-12-28 DIAGNOSIS — Z853 Personal history of malignant neoplasm of breast: Secondary | ICD-10-CM

## 2010-12-28 DIAGNOSIS — Z0000 Encounter for general adult medical examination without abnormal findings: Secondary | ICD-10-CM

## 2010-12-28 LAB — BASIC METABOLIC PANEL
BUN: 17 mg/dL (ref 6–23)
Calcium: 10.4 mg/dL (ref 8.4–10.5)
Creat: 1.06 mg/dL (ref 0.50–1.10)
Glucose, Bld: 101 mg/dL — ABNORMAL HIGH (ref 70–99)

## 2010-12-28 LAB — HEPATIC FUNCTION PANEL
Albumin: 4.2 g/dL (ref 3.5–5.2)
Total Bilirubin: 0.8 mg/dL (ref 0.3–1.2)

## 2010-12-28 MED ORDER — FLUTICASONE PROPIONATE 50 MCG/ACT NA SUSP: 2.0000 | Freq: Every day | NASAL | Status: DC

## 2010-12-28 MED ORDER — SIMVASTATIN 10 MG PO TABS: 10.0000 mg | ORAL_TABLET | Freq: Every day | ORAL | Status: DC

## 2010-12-28 MED ORDER — LOSARTAN POTASSIUM-HCTZ 50-12.5 MG PO TABS: 1.0000 | ORAL_TABLET | Freq: Every day | ORAL | Status: DC

## 2010-12-28 MED ORDER — BISOPROLOL FUMARATE 10 MG PO TABS: 10.0000 mg | ORAL_TABLET | Freq: Every day | ORAL | Status: DC

## 2010-12-28 NOTE — Progress Notes (Addendum)
Subjective:    Patient ID: [name], female    DOB: [birth year], 70 y.o.   MRN: [id number]  HPI  Subjective:   Patient here for Medicare annual wellness visit and management of other chronic and acute problems. HTN- stopped amlondipine due to dizziness.  Allergic rhinitis-  Takes allegra,   Still feels stuffy.  Obesity-She has lost 8 pounds since her visit back in May.  Eating healthy, keeping to a 1500 cal diet.  She is in rehab due to hip injury and fall. Had fal several months ago.  This caused increased back pain- ortho did imaging and she was told no fracture. Recommended rehab.  She feels that this is helping with her balance. Doing home exercises recommended by rehab 30 minutes a day.  Breast Cancer-  Reports that she had early detection, had a bilateral mastectomy and follows every 6 months.  Never required chemo or radiation.    ?osteoporosis- has f/u dexa 7/13 which has been ordered by oncology.  They are being managed by oncology.    Hyperlipidemia- on simvastatin, denies myalgia.  Preventative- dexa up to date. Last colo 2005- reports that this was normal.  She is due for pap- sees GYN.  Had zostavax.  Has also had pneumovax.  Last tetanus was 11/09.  Risk factors:   Roster of Physicians Providing Medical Care to Patient: Dr. Levan Hurst Dr. Lajoyce Corners- ortho Dr. Carin Primrose- GYN- [facility].    Activities of Daily Living  In your present state of health, do you have any difficulty performing the following activities? Preparing food and eating?: No  Bathing yourself: No  Getting dressed: No  Using the toilet:No  Moving around from place to place: No  In the past year have you fallen or had a near fall?: Yes Home Safety: Has smoke detector and wears seat belts. No firearms. No excess sun exposure.  Diet and Exercise  Current exercise habits: see above Dietary issues discussed: healthy diet   Depression Screen  (Note: if answer to either of  the following is "Yes", then a more complete depression screening is indicated)  Q1: Over the past two weeks, have you felt down, depressed or hopeless?no  Q2: Over the past two weeks, have you felt little interest or pleasure in doing things? no   The following portions of the patient's history were reviewed and updated as appropriate: allergies, current medications, past family history, past medical history, past social history, past surgical history and problem list.    Review of Systems  Objective:   Vision:see nursing Hearing: able to hear forced whisper at 6 feet.   Body mass index:see vitals Cognitive Impairment Assessment: cognition, memory and judgment appear normal.  Assessment:   Medicare wellness utd on preventive parameters   Hypertension: mildly elevated. Up dosage of meds and request patient return in 2 weeks for Bp check. Diabetes: Type II DM, controlled. A1C reviewed or ordered. Encourage patient to continue exercise and healthy diet.  Plan:    During the course of the visit the patient was educated and counseled about appropriate screening and preventive services including:       Fall prevention   Screening mammography  Bone densitometry screening  Diabetes screening  Nutrition counseling   Vaccines / LABS  Patient Instructions (the written plan) was given to the patient.      Review of Systems  Constitutional: Negative for fever.  HENT: Positive for rhinorrhea.   Eyes: Negative for visual disturbance.  Respiratory: Positive  for cough.        Mild cough- dry, usually associated with allergies.  Cardiovascular: Positive for leg swelling. Negative for chest pain.  Gastrointestinal: Negative for nausea, vomiting and diarrhea.  Genitourinary: Negative for dysuria and frequency.  Musculoskeletal: Positive for back pain. Negative for myalgias.  Skin: Negative for rash.  Neurological: Negative for dizziness and headaches.  Hematological: Negative for  adenopathy.  Psychiatric/Behavioral:       Denies current issues with depression or anxiety       Objective:   Physical Exam  Constitutional: She is oriented to person, place, and time. She appears well-developed and well-nourished.  HENT:  Head: Normocephalic and atraumatic.  Eyes: Conjunctivae are normal. Pupils are equal, round, and reactive to light.  Cardiovascular: Normal rate and regular rhythm.   Pulmonary/Chest: Effort normal and breath sounds normal.  Abdominal: Soft. Bowel sounds are normal.  Musculoskeletal: Normal range of motion. She exhibits no edema and no tenderness.  Neurological: She is alert and oriented to person, place, and time.  Skin: Skin is warm and dry.  Psychiatric: She has a normal mood and affect. Her behavior is normal. Judgment and thought content normal.          Assessment & Plan:

## 2010-12-28 NOTE — Assessment & Plan Note (Signed)
She is followed at the [facility].

## 2010-12-28 NOTE — Patient Instructions (Addendum)
Please follow up in 1 yr- sooner if problems or concerns. Complete your blood work on the first floor.

## 2010-12-28 NOTE — Assessment & Plan Note (Signed)
She is not getting enough relief with Allegra alone, we'll add Flonase.

## 2010-12-28 NOTE — Assessment & Plan Note (Signed)
She is on Fosamax, she has a followup DEXA scan scheduled through the cancer Center. She discontinued her calcium supplement due to hypercalcemia, however she continues her vitamin D supplement.

## 2010-12-28 NOTE — Assessment & Plan Note (Signed)
BP Readings from Last 3 Encounters:  12/28/10 118/76  11/26/09 122/76  02/05/09 112/60  pressure stable continue current regimen.

## 2010-12-28 NOTE — Assessment & Plan Note (Signed)
Continue statin.Denies myalgias.

## 2011-01-02 ENCOUNTER — Encounter: Payer: Self-pay | Admitting: Family

## 2011-01-02 LAB — LIPID PANEL
Cholesterol: 149 mg/dL (ref 0–200)
HDL: 62 mg/dL (ref >39)
Total CHOL/HDL Ratio: 2.4 Ratio
Triglycerides: 61 mg/dL (ref <150)

## 2011-01-09 ENCOUNTER — Telehealth: Payer: Self-pay | Admitting: *Deleted

## 2011-01-09 NOTE — Telephone Encounter (Signed)
Her sugar has been slightly elevated for the past few years the not in a diabetic range.she should try to avoid large servings of carbohydrates, substitute whole-grain whole-wheat versions for the white versions of bread and pasta. Avoid concentrated sweets and things containing high fructose corn syrup. We will continue to keep her sugars and her upcoming appointments

## 2011-01-09 NOTE — Telephone Encounter (Signed)
Pt left message stating she had received her lab results and her glucose was elevated. Pt is wanting to know if she should be following a specific diet or what should she do for this? Please advise.

## 2011-01-10 NOTE — Telephone Encounter (Signed)
Pt.notified

## 2011-01-18 ENCOUNTER — Ambulatory Visit (HOSPITAL_COMMUNITY): Payer: PRIVATE HEALTH INSURANCE

## 2011-01-20 ENCOUNTER — Ambulatory Visit (HOSPITAL_COMMUNITY)
Admission: RE | Admit: 2011-01-20 | Discharge: 2011-01-20 | Disposition: A | Discharge status: Home / Self Care | Payer: Medicare Other | Source: Ambulatory Visit | Attending: Hematology & Oncology | Admitting: Hematology & Oncology

## 2011-01-20 DIAGNOSIS — Z780 Asymptomatic menopausal state: Secondary | ICD-10-CM

## 2011-01-20 DIAGNOSIS — Z13820 Encounter for screening for osteoporosis: Secondary | ICD-10-CM | POA: Insufficient documentation

## 2011-03-03 ENCOUNTER — Ambulatory Visit (INDEPENDENT_AMBULATORY_CARE_PROVIDER_SITE_OTHER): Payer: Medicare Other | Admitting: Family

## 2011-03-03 ENCOUNTER — Encounter: Payer: Self-pay | Admitting: Family

## 2011-03-03 VITALS — BP 110/70 | HR 66 | Temp 97.8°F | Resp 16 | Ht 67.0 in | Wt 215.1 lb

## 2011-03-03 DIAGNOSIS — I951 Orthostatic hypotension: Secondary | ICD-10-CM

## 2011-03-03 DIAGNOSIS — R739 Hyperglycemia, unspecified: Secondary | ICD-10-CM

## 2011-03-03 DIAGNOSIS — R42 Dizziness and giddiness: Secondary | ICD-10-CM

## 2011-03-03 DIAGNOSIS — R7309 Other abnormal glucose: Secondary | ICD-10-CM

## 2011-03-03 LAB — HEMOGLOBIN A1C: Mean Plasma Glucose: 128 mg/dL — ABNORMAL HIGH (ref <117)

## 2011-03-03 NOTE — Patient Instructions (Signed)
Please follow up in 2 weeks, sooner if problems or concerns.

## 2011-03-03 NOTE — Assessment & Plan Note (Addendum)
She is noted to have a 20 point drop in her systolic pressure from laying to standing.  She has purposefully lost weight recently and it seems that her BP therapy needs are changing.  Will have her cut her hyzaar in half and bring her back in 2 weeks.   EKG performed today notes NSR.

## 2011-03-03 NOTE — Progress Notes (Signed)
Subjective:    Patient ID: [name], female    DOB: [birth year], 70 y.o.   MRN: [id number]  HPI  Ms.  [name] is a 70 yr old female who presents today with chief complaint of feeling "lightheaded."  She reports that this has been going on for some time, but has worsened the last few weeks.  Symptoms are described as moderate. Reports that her BP readings at hom 116/70, HR in the 70's. Room does not spin.  She is undergoing rehab for her hip.  Denies associated chest pain except for some infrequent GERD symptoms.  Denies associated SOB.  She does report some intermittent ears "closing." Takes allegra daily.  Denies history of sinus pain/pressure.  She tries to drink 8 glasses of water a day.  Weight loss- exercising and diet.      Review of Systems See HPI  Past Medical History  Diagnosis Date  . Hypertension   . Obesity   . History of breast cancer   . Allergic rhinitis   . Osteoarthritis     severe right hip osteoarthritis-s/p hip replacement    History   Social History  . Marital Status: Married    Spouse Name: N/A    Number of Children: N/A  . Years of Education: N/A   Occupational History  . Not on file.   Social History Main Topics  . Smoking status: Former Games developer  . Smokeless tobacco: Not on file   Comment: quit in 1976 smoked for approx. 10 years  . Alcohol Use: Yes     drinks wine with dinner  . Drug Use: Not on file  . Sexually Active: Not on file   Other Topics Concern  . Not on file   Social History Narrative   RetiredThe patient is married and has one child and two grandchildren that live in the area.  She drinks wine with dinner.  She quit smoking in 1976, smoked forapproximately 10 years.   Moved to [place] from [place]    Past Surgical History  Procedure Date  . Total hip arthroplasty     right hip   . Mastectomy     bilateral mastectomy  . Tonsillectomy 1949  . Eye surgery 2011    cataract removal both eyes    Family History    Problem Relation Age of Onset  . Heart failure Father     deceased age 53  . Diabetes Father   . Alcohol abuse Father   . Breast cancer Mother     deceased at age 56 secondary to breast cancer    Allergies  Allergen Reactions  . Sulfonamide Derivatives     REACTION: Swelling of the face    Current Outpatient Prescriptions on File Prior to Visit  Medication Sig Dispense Refill  . alendronate (FOSAMAX) 70 MG tablet Take 70 mg by mouth every 7 (seven) days. Take with a full glass of water on an empty stomach.       . bisoprolol (ZEBETA) 10 MG tablet Take 1 tablet (10 mg total) by mouth daily.  90 tablet  3  . Ergocalciferol (VITAMIN D2) 2000 UNITS TABS Take 1 tablet by mouth daily.        Marland Kitchen estradiol (VAGIFEM) 25 MCG vaginal tablet Place 25 mcg vaginally 2 (two) times a week.        . fexofenadine (ALLEGRA) 180 MG tablet Take 180 mg by mouth daily as needed.       . fluticasone (FLONASE) 50  MCG/ACT nasal spray Place 2 sprays into the nose daily.  16 g  2  . Gelatin Capsules, Empty, CAPS 2 (two) times daily.        . Omega-3 Fatty Acids (FISH OIL) 1000 MG CAPS Take 1,000 mg by mouth daily.       . simvastatin (ZOCOR) 10 MG tablet Take 1 tablet (10 mg total) by mouth at bedtime.  90 tablet  3    BP 110/70  Pulse 66  Temp(Src) 97.8 F (36.6 C) (Oral)  Resp 16  Ht 5\' 7"  (1.702 m)  Wt 215 lb 1.3 oz (97.56 kg)  BMI 33.69 kg/m2  SpO2 99%  LMP 07/11/1991       Objective:   Physical Exam  Constitutional: She appears well-developed and well-nourished.  HENT:  Right Ear: Tympanic membrane and ear canal normal.  Left Ear: Tympanic membrane and ear canal normal.  Mouth/Throat: Uvula is midline, oropharynx is clear and moist and mucous membranes are normal.  Cardiovascular: Normal rate and regular rhythm.   No murmur heard. Pulmonary/Chest: Effort normal and breath sounds normal. No respiratory distress. She has no wheezes. She has no rales. She exhibits no tenderness.   Musculoskeletal: She exhibits no edema.  Skin: Skin is warm and dry.  Psychiatric: She has a normal mood and affect. Her behavior is normal. Judgment and thought content normal.          Assessment & Plan:

## 2011-03-06 ENCOUNTER — Encounter: Payer: Self-pay | Admitting: Family

## 2011-03-06 ENCOUNTER — Telehealth: Payer: Self-pay | Admitting: Family

## 2011-03-06 DIAGNOSIS — R7303 Prediabetes: Secondary | ICD-10-CM | POA: Insufficient documentation

## 2011-03-06 HISTORY — DX: Prediabetes: R73.03

## 2011-03-06 NOTE — Assessment & Plan Note (Signed)
Pt was counseled today by phone on a diabetic diet, exercise and weight loss.  We discussed new diagnosis of borderline diabetes.

## 2011-03-06 NOTE — Telephone Encounter (Signed)
See problem (borderline diabetes).

## 2011-03-17 ENCOUNTER — Ambulatory Visit (INDEPENDENT_AMBULATORY_CARE_PROVIDER_SITE_OTHER): Payer: Medicare Other | Admitting: Family

## 2011-03-17 VITALS — BP 118/75 | HR 66 | Temp 98.0°F | Resp 16 | Ht 67.0 in

## 2011-03-17 DIAGNOSIS — I951 Orthostatic hypotension: Secondary | ICD-10-CM

## 2011-03-17 NOTE — Patient Instructions (Signed)
Please follow up in 3 months.  

## 2011-03-17 NOTE — Assessment & Plan Note (Signed)
While her SBP drop is still 20 points, her symptoms have nearly resolved.  Will plan to monitor her on her current dose of Hyzaar.

## 2011-03-17 NOTE — Progress Notes (Signed)
Subjective:    Patient ID: [name], female    DOB: 09/23/1940, 70 y.o.   MRN: [id number]  HPI  [name] is a 70 yr old female who presents today for follow up of her orthostatic hypotension.  She notes improvement in her dizziness since cutting her Hyzaar dose in half.  Denies any falls or near falls since last visit.  Has tried to change her diet and has started the [employer].    Review of Systems See HPI  Past Medical History  Diagnosis Date  . Hypertension   . Obesity   . History of breast cancer   . Allergic rhinitis   . Osteoarthritis     severe right hip osteoarthritis-s/p hip replacement  . 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 Marital Status: Married    Spouse Name: N/A    Number of Children: N/A  . Years of Education: N/A   Occupational History  . Not on file.   Social History Main Topics  . Smoking status: Former Games developer  . Smokeless tobacco: Not on file   Comment: quit in 1976 smoked for approx. 10 years  . Alcohol Use: Yes     drinks wine with dinner  . Drug Use: Not on file  . Sexually Active: Not on file   Other Topics Concern  . Not on file   Social History Narrative   RetiredThe patient is married and has one child and two grandchildren that live in the area.  She drinks wine with dinner.  She quit smoking in 1976, smoked forapproximately 10 years.   Moved to [place] from [place]    Past Surgical History  Procedure Date  . Total hip arthroplasty     right hip   . Mastectomy     bilateral mastectomy  . Tonsillectomy 1949  . Eye surgery 2011    cataract removal both eyes    Family History  Problem Relation Age of Onset  . Heart failure Father     deceased age 67  . Diabetes Father   . Alcohol abuse Father   . Breast cancer Mother     deceased at age 75 secondary to breast cancer    Allergies  Allergen Reactions  . Sulfonamide Derivatives     REACTION: Swelling of the  face    Current Outpatient Prescriptions on File Prior to Visit  Medication Sig Dispense Refill  . alendronate (FOSAMAX) 70 MG tablet Take 70 mg by mouth every 7 (seven) days. Take with a full glass of water on an empty stomach.       . bisoprolol (ZEBETA) 10 MG tablet Take 1 tablet (10 mg total) by mouth daily.  90 tablet  3  . Ergocalciferol (VITAMIN D2) 2000 UNITS TABS Take 1 tablet by mouth daily.        Marland Kitchen estradiol (VAGIFEM) 25 MCG vaginal tablet Place 25 mcg vaginally 2 (two) times a week.        . fexofenadine (ALLEGRA) 180 MG tablet Take 180 mg by mouth daily as needed.       . fluticasone (FLONASE) 50 MCG/ACT nasal spray Place 2 sprays into the nose daily.  16 g  2  . Gelatin Capsules, Empty, CAPS 2 (two) times daily.        Marland Kitchen losartan-hydrochlorothiazide (HYZAAR) 50-12.5 MG per tablet Take 0.5 tablets by mouth daily.        . Omega-3 Fatty Acids (  FISH OIL) 1000 MG CAPS Take 1,000 mg by mouth daily.       . simvastatin (ZOCOR) 10 MG tablet Take 1 tablet (10 mg total) by mouth at bedtime.  90 tablet  3    BP 118/75  Pulse 66  Temp 98 F (36.7 C)  Resp 16  Ht 5\' 7"  (1.702 m)  LMP 07/11/1991       Objective:   Physical Exam  Constitutional: She appears well-developed and well-nourished.  Cardiovascular: Normal rate and regular rhythm.   Pulmonary/Chest: Effort normal and breath sounds normal. No respiratory distress. She has no wheezes. She has no rales. She exhibits no tenderness.  Musculoskeletal:       Bilateral LE edema 2-3+ (at baseline)          Assessment & Plan:

## 2011-04-15 ENCOUNTER — Other Ambulatory Visit: Payer: Self-pay | Admitting: Family

## 2011-06-05 ENCOUNTER — Encounter: Payer: Self-pay | Admitting: Family

## 2011-06-05 ENCOUNTER — Ambulatory Visit (INDEPENDENT_AMBULATORY_CARE_PROVIDER_SITE_OTHER): Payer: Medicare Other | Admitting: Family

## 2011-06-05 VITALS — BP 114/84 | HR 66 | Temp 97.8°F | Resp 16 | Ht 67.0 in | Wt 209.1 lb

## 2011-06-05 DIAGNOSIS — M25551 Pain in right hip: Secondary | ICD-10-CM | POA: Insufficient documentation

## 2011-06-05 DIAGNOSIS — M25559 Pain in unspecified hip: Secondary | ICD-10-CM

## 2011-06-05 MED ORDER — TRAMADOL HCL 50 MG PO TABS: 50.0000 mg | ORAL_TABLET | Freq: Three times a day (TID) | ORAL | Status: DC | PRN

## 2011-06-05 NOTE — Patient Instructions (Addendum)
You will be contacted about your referral to Orthopedics. Follow up in 3 months. Happy Holidays!

## 2011-06-05 NOTE — Assessment & Plan Note (Signed)
Pt with right THR and continued pain despite PT. She desires a second opinion re: need to redo her hip.  Will rever to Dr. Wyline Mood at [facility].

## 2011-06-05 NOTE — Progress Notes (Signed)
Subjective:    Patient ID: [name], female    DOB: Jan 22, 1941, 70 y.o.   MRN: [id number]  HPI  Ms.  Nodal is a 70 yr old female with chief complaint of right hip pain.  She is taking chromium and cobalt.  She is following with Dr. Lajoyce Corners. She reports that she has a hard time waling straight.  Notes improved balance with pain.  She reports that her chairs must be a certain height, straight back.   She is only able to sleep on the left side.   She has some pain with weight bearing.  Notes that her hip is stiff.  She had a THR in 2007.  She tells me that her hip replacement has been recalled.  She has been doing therapy with some improvement in her symptoms. She completed PT in the end of august.  As soon as she stopped PT pain worsened.  Overall pain is worsening.  She is requesting a second opinion.        Review of Systems See HPI  Past Medical History  Diagnosis Date  . Hypertension   . Obesity   . History of breast cancer   . Allergic rhinitis   . Osteoarthritis     severe right hip osteoarthritis-s/p hip replacement  . 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 Marital Status: Married    Spouse Name: N/A    Number of Children: N/A  . Years of Education: N/A   Occupational History  . Not on file.   Social History Main Topics  . Smoking status: Former Games developer  . Smokeless tobacco: Not on file   Comment: quit in 1976 smoked for approx. 10 years  . Alcohol Use: Yes     drinks wine with dinner  . Drug Use: Not on file  . Sexually Active: Not on file   Other Topics Concern  . Not on file   Social History Narrative   RetiredThe patient is married and has one child and two grandchildren that live in the area.  She drinks wine with dinner.  She quit smoking in 1976, smoked forapproximately 10 years.   Moved to [place] from [place]    Past Surgical History  Procedure Date  . Total hip arthroplasty     right hip   . Mastectomy       bilateral mastectomy  . Tonsillectomy 1949  . Eye surgery 2011    cataract removal both eyes    Family History  Problem Relation Age of Onset  . Heart failure Father     deceased age 69  . Diabetes Father   . Alcohol abuse Father   . Breast cancer Mother     deceased at age 47 secondary to breast cancer    Allergies  Allergen Reactions  . Sulfonamide Derivatives     REACTION: Swelling of the face    Current Outpatient Prescriptions on File Prior to Visit  Medication Sig Dispense Refill  . alendronate (FOSAMAX) 70 MG tablet Take 70 mg by mouth every 7 (seven) days. Take with a full glass of water on an empty stomach.       . bisoprolol (ZEBETA) 10 MG tablet Take 1 tablet (10 mg total) by mouth daily.  90 tablet  3  . Ergocalciferol (VITAMIN D2) 2000 UNITS TABS Take 1 tablet by mouth daily.        Marland Kitchen estradiol (VAGIFEM) 25 MCG vaginal tablet Place 25  mcg vaginally 2 (two) times a week.        . fexofenadine (ALLEGRA) 180 MG tablet Take 180 mg by mouth daily as needed.       . fluticasone (FLONASE) 50 MCG/ACT nasal spray PLACE 2 SPRAYS INTO THE NOSE DAILY.  16 g  2  . Gelatin Capsules, Empty, CAPS 2 (two) times daily.        Marland Kitchen losartan-hydrochlorothiazide (HYZAAR) 50-12.5 MG per tablet Take 0.5 tablets by mouth daily.        . Omega-3 Fatty Acids (FISH OIL) 1000 MG CAPS Take 1,000 mg by mouth daily.       . simvastatin (ZOCOR) 10 MG tablet Take 1 tablet (10 mg total) by mouth at bedtime.  90 tablet  3    BP 114/84  Pulse 66  Temp(Src) 97.8 F (36.6 C) (Oral)  Resp 16  Ht 5\' 7"  (1.702 m)  Wt 209 lb 1.9 oz (94.856 kg)  BMI 32.75 kg/m2  LMP 07/11/1991       Objective:   Physical Exam  Constitutional: She appears well-developed and well-nourished.  Cardiovascular: Normal rate and regular rhythm.   No murmur heard. Pulmonary/Chest: Effort normal and breath sounds normal. No respiratory distress.  Musculoskeletal:       Increased pain with flexion of right hip.  No  tenderness to palpation overlying right hip.            Assessment & Plan:

## 2011-06-14 ENCOUNTER — Other Ambulatory Visit: Payer: Self-pay | Admitting: Family

## 2011-06-14 ENCOUNTER — Ambulatory Visit (HOSPITAL_BASED_OUTPATIENT_CLINIC_OR_DEPARTMENT_OTHER): Payer: Medicare Other | Admitting: Family

## 2011-06-14 ENCOUNTER — Telehealth: Payer: Self-pay | Admitting: *Deleted

## 2011-06-14 ENCOUNTER — Other Ambulatory Visit (HOSPITAL_BASED_OUTPATIENT_CLINIC_OR_DEPARTMENT_OTHER): Payer: Medicare Other | Admitting: Lab

## 2011-06-14 ENCOUNTER — Encounter: Payer: Self-pay | Admitting: Family

## 2011-06-14 VITALS — BP 139/82 | HR 75 | Temp 97.7°F | Ht 67.0 in | Wt 212.0 lb

## 2011-06-14 DIAGNOSIS — D509 Iron deficiency anemia, unspecified: Secondary | ICD-10-CM

## 2011-06-14 DIAGNOSIS — M899 Disorder of bone, unspecified: Secondary | ICD-10-CM

## 2011-06-14 DIAGNOSIS — Z87898 Personal history of other specified conditions: Secondary | ICD-10-CM

## 2011-06-14 DIAGNOSIS — Z862 Personal history of diseases of the blood and blood-forming organs and certain disorders involving the immune mechanism: Secondary | ICD-10-CM

## 2011-06-14 DIAGNOSIS — D649 Anemia, unspecified: Secondary | ICD-10-CM

## 2011-06-14 DIAGNOSIS — M25559 Pain in unspecified hip: Secondary | ICD-10-CM

## 2011-06-14 DIAGNOSIS — F329 Major depressive disorder, single episode, unspecified: Secondary | ICD-10-CM

## 2011-06-14 DIAGNOSIS — M8580 Other specified disorders of bone density and structure, unspecified site: Secondary | ICD-10-CM

## 2011-06-14 DIAGNOSIS — M949 Disorder of cartilage, unspecified: Secondary | ICD-10-CM

## 2011-06-14 DIAGNOSIS — F32A Depression, unspecified: Secondary | ICD-10-CM

## 2011-06-14 LAB — CBC WITH DIFFERENTIAL (CANCER CENTER ONLY)
BASO%: 0.7 % (ref 0.0–2.0)
EOS%: 4 % (ref 0.0–7.0)
LYMPH%: 25 % (ref 14.0–48.0)
MCH: 27.3 pg (ref 26.0–34.0)
MCHC: 32 g/dL (ref 32.0–36.0)
MCV: 85 fL (ref 81–101)
MONO#: 0.9 10*3/uL (ref 0.1–0.9)
MONO%: 7.2 % (ref 0.0–13.0)
Platelets: 455 10*3/uL — ABNORMAL HIGH (ref 145–400)
RDW: 14.2 % (ref 11.1–15.7)
WBC: 11.8 10*3/uL — ABNORMAL HIGH (ref 3.9–10.0)

## 2011-06-14 LAB — RETICULOCYTES (CHCC)
RBC.: 3.57 MIL/uL — ABNORMAL LOW (ref 3.87–5.11)
Retic Ct Pct: 1.2 % (ref 0.4–2.3)

## 2011-06-14 LAB — IRON AND TIBC: TIBC: 278 ug/dL (ref 250–470)

## 2011-06-14 MED ORDER — ALENDRONATE SODIUM 70 MG PO TABS: 70.0000 mg | ORAL_TABLET | ORAL | Status: DC

## 2011-06-14 MED ORDER — ESCITALOPRAM OXALATE 10 MG PO TABS: 10.0000 mg | ORAL_TABLET | Freq: Every day | ORAL | Status: DC

## 2011-06-14 NOTE — Progress Notes (Signed)
DIAGNOSIS: Patient Active Problem List  Diagnoses Date Noted  . Hip pain, right 06/05/2011  . 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 HYPERCALCEMIA 11/29/2009  . Osteopenia 11/26/2009  . ALLERGIC RHINITIS 09/24/2008  . ADENOCARCINOMA, BREAST, HX OF 09/24/2008  . HYPERLIPIDEMIA 01/24/2008  . ANEMIA 01/24/2008  . HYPERTENSION 12/13/2007     Encounter Diagnoses  Name Primary?  . History of anemia Yes  . Iron deficiency anemia   . Depression   . Osteopenia     CURRENT THERAPY: Surveillance  INTERIM HISTORY: Patient comes in for regularly scheduled visit, followup for anemia. 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 Chief complaint today continues to be right hip pain. Has a right prosthetic hip which has recently been recalled. Has appointment with orthopedics tomorrow for second opinion regarding replacement of this recalled hip joint.  Reports increased fatigue with dyspnea on exertion. Had been on a calorie restricted diet, less restrictive over the last month but has lost 5 pounds. Also reports depression, manifested by loss of interest in things she enjoyed in the past. This also affects her sleep, states she is unable to get to sleep until the early hours of the morning.  No headache or blurred vision. No new bone pain, and continues to have chronic right hip pain. No hormonal pain. Bowel and bladder function are normal, and no blood in the stool. No cough, mild to moderate dyspnea on exertion.   Tearful at times during our discussion. Admits to uncertainty for the future and pending possible hip replacement. Is concerned over her and her husband's health. Is also bothered by chronic hip pain, uses tramadol for pain control.  PHYSICAL EXAM: BP 139/82  Pulse 75  Temp(Src) 97.7 F (36.5 C) (Oral)  Ht 5\' 7"  (1.702 m)  Wt 212 lb (96.163 kg)  BMI 33.20 kg/m2  LMP  07/11/1991 General: Well developed, well nourished, in no acute distress.  EENT: No ocular or oral lesions. No stomatitis.  Respiratory: Lungs are clear to auscultation bilaterally with normal respiratory movement and no accessory muscle use. Cardiac: No murmur, rub or tachycardia. No upper extremity edema, 2+ pitting edema, right lower extremitiy.  GI: Abdomen is soft, no palpable hepatosplenomegaly. No fluid wave. No tenderness. Musculoskeletal: No kyphosis, no tenderness over the spine, ribs or hips. Lymph: No cervical, infraclavicular, axillary or inguinal adenopathy. Neuro: No focal neurological deficits. Psych: Alert and oriented X 3, subdued in mood and affect.   SOCIAL HISTORY:   . Marital Status: Married    Spouse Name: Richard    Number of Children: 1    Social History Main Topics  . Smoking status: Former Smoker -- 10 years    Types: Cigarettes    Quit date: 07/14/1974  . Smokeless tobacco: Not on file   Comment: quit in 1976 smoked for approx. 10 years  . Alcohol Use: Yes     drinks wine with dinner  . Drug Use: No    Social History Narrative   Retired The patient is married and has one child and two grandchildren that live in the area.  She drinks wine with dinner.  She quit smoking in 1976, smoked for approximately 10 years.   Moved to [place] from [place]    LABORATORY STUDIES:   Results for orders placed in visit on 06/14/11  CBC WITH DIFFERENTIAL ([facility])      Component Value Range  WBC 11.8 (*) 3.9 - 10.0 (10e3/uL)   RBC 3.52 (*) 3.70 - 5.32 (10e6/uL)   HGB 9.6 (*) 11.6 - 15.9 (g/dL)   HCT 16.1 (*) 09.6 - 46.6 (%)   MCV 85  81 - 101 (fL)   MCH 27.3  26.0 - 34.0 (pg)   MCHC 32.0  32.0 - 36.0 (g/dL)   RDW 04.5  40.9 - 81.1 (%)   Platelets 455 (*) 145 - 400 (10e3/uL)   NEUT# 7.4 (*) 1.5 - 6.5 (10e3/uL)   LYMPH# 2.9  0.9 - 3.3 (10e3/uL)   MONO# 0.9  0.1 - 0.9 (10e3/uL)   Eosinophils Absolute 0.5  0.0 - 0.5 (10e3/uL)   BASO# 0.1  0.0 -  0.2 (10e3/uL)   NEUT% 63.1  39.6 - 80.0 (%)   LYMPH% 25.0  14.0 - 48.0 (%)   MONO% 7.2  0.0 - 13.0 (%)   EOS% 4.0  0.0 - 7.0 (%)   BASO% 0.7  0.0 - 2.0 (%)    IMPRESSION:  70 year old white female with: #1. 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 Underwent bilateral mastectomy with no reconstruction. #2. Anemic, hemoglobin 9.6, MCV 85. #3. Osteopenia on bone density exam done July, 2012. #4. Chronic right hip pain, and currently has recalled right hip prosthetic. Undergoing evaluation by orthopedics for possible replacement. #5. New onset depression, possibly owing to chronic pain. #6. Iron studies pending.   PLAN:   #1. Refill for 90 day supply of Fosamax per her request. #2. Referral to [name] for workup of possible blood loss anemia. Last colonoscopy 7 years ago. #3. Prescription for Lexapro. #4. Check pending iron studies.If these show iron deficiency, we will call her back for iron infusion. #5. Hemoccult slides x3. She will return these by mail. #6. Return to clinic in 6 months with lab and appointment with Dr. Myna Hidalgo.  DISCUSSION:

## 2011-06-14 NOTE — Telephone Encounter (Signed)
Talked to Robin at [facility] they will call Pt with appointment

## 2011-06-15 LAB — RETICULOCYTES (CHCC)
ABS Retic: 42.8 10*3/uL (ref 19.0–186.0)
ABS Retic: 42.8 10*3/uL (ref 19.0–186.0)
Retic Ct Pct: 1.2 % (ref 0.4–2.3)

## 2011-06-15 LAB — IRON AND TIBC: %SAT: 9 % — ABNORMAL LOW (ref 20–55)

## 2011-06-16 ENCOUNTER — Other Ambulatory Visit (HOSPITAL_COMMUNITY): Payer: Self-pay | Admitting: Orthopedic Surgery

## 2011-06-16 ENCOUNTER — Ambulatory Visit: Payer: Medicare Other | Admitting: Family

## 2011-06-16 ENCOUNTER — Other Ambulatory Visit: Payer: Medicare Other | Admitting: Lab

## 2011-06-16 DIAGNOSIS — M25551 Pain in right hip: Secondary | ICD-10-CM

## 2011-06-19 ENCOUNTER — Encounter: Payer: Self-pay | Admitting: Internal Medicine

## 2011-06-21 ENCOUNTER — Other Ambulatory Visit: Payer: Self-pay | Admitting: Family

## 2011-06-21 ENCOUNTER — Ambulatory Visit (HOSPITAL_BASED_OUTPATIENT_CLINIC_OR_DEPARTMENT_OTHER): Payer: Medicare Other | Admitting: Lab

## 2011-06-21 DIAGNOSIS — D649 Anemia, unspecified: Secondary | ICD-10-CM

## 2011-06-23 ENCOUNTER — Ambulatory Visit (AMBULATORY_SURGERY_CENTER): Payer: Medicare Other

## 2011-06-23 VITALS — Ht 67.0 in | Wt 205.0 lb

## 2011-06-23 DIAGNOSIS — D649 Anemia, unspecified: Secondary | ICD-10-CM

## 2011-06-23 MED ORDER — PEG-KCL-NACL-NASULF-NA ASC-C 100 G PO SOLR: 1.0000 | Freq: Once | ORAL | Status: DC

## 2011-06-24 ENCOUNTER — Other Ambulatory Visit: Payer: Self-pay | Admitting: Family

## 2011-06-24 DIAGNOSIS — M8580 Other specified disorders of bone density and structure, unspecified site: Secondary | ICD-10-CM

## 2011-06-28 ENCOUNTER — Ambulatory Visit (HOSPITAL_COMMUNITY)
Admission: RE | Admit: 2011-06-28 | Discharge: 2011-06-28 | Disposition: A | Discharge status: Home / Self Care | Payer: Medicare Other | Source: Ambulatory Visit | Attending: Orthopedic Surgery | Admitting: Orthopedic Surgery

## 2011-06-28 DIAGNOSIS — M25559 Pain in unspecified hip: Secondary | ICD-10-CM | POA: Insufficient documentation

## 2011-06-28 DIAGNOSIS — M76899 Other specified enthesopathies of unspecified lower limb, excluding foot: Secondary | ICD-10-CM | POA: Insufficient documentation

## 2011-06-28 DIAGNOSIS — R937 Abnormal findings on diagnostic imaging of other parts of musculoskeletal system: Secondary | ICD-10-CM | POA: Insufficient documentation

## 2011-06-28 DIAGNOSIS — M25551 Pain in right hip: Secondary | ICD-10-CM

## 2011-07-07 ENCOUNTER — Encounter: Payer: Self-pay | Admitting: Internal Medicine

## 2011-07-07 ENCOUNTER — Ambulatory Visit (AMBULATORY_SURGERY_CENTER): Payer: Medicare Other | Admitting: Internal Medicine

## 2011-07-07 DIAGNOSIS — K635 Polyp of colon: Secondary | ICD-10-CM

## 2011-07-07 DIAGNOSIS — D649 Anemia, unspecified: Secondary | ICD-10-CM

## 2011-07-07 DIAGNOSIS — Z1211 Encounter for screening for malignant neoplasm of colon: Secondary | ICD-10-CM

## 2011-07-07 DIAGNOSIS — D126 Benign neoplasm of colon, unspecified: Secondary | ICD-10-CM

## 2011-07-07 MED ORDER — SODIUM CHLORIDE 0.9 % IV SOLN: 500.0000 mL | INTRAVENOUS | Status: DC

## 2011-07-07 NOTE — Progress Notes (Signed)
Patient did not experience any of the following events: a burn prior to discharge; a fall within the facility; wrong site/side/patient/procedure/implant event; or a hospital transfer or hospital admission upon discharge from the facility. (G8907) Patient did not have preoperative order for IV antibiotic SSI prophylaxis. (G8918)  

## 2011-07-07 NOTE — Op Note (Signed)
[facility] [address] [place], [place]  [address]  COLONOSCOPY PROCEDURE REPORT  PATIENT:  Courtney, Owens  MR#:  [id number] BIRTHDATE:  [birth year], 70 yrs. old  GENDER:  female ENDOSCOPIST:  Carie Caddy. Niel Peretti, MD REF. BY:  Alma DownsLendell Caprice, N.P., Colman Cater PROCEDURE DATE:  07/07/2011 PROCEDURE:  Colonoscopy with snare polypectomy, Colon with cold biopsy polypectomy ASA CLASS:  Class II INDICATIONS:  Anemia, Screening MEDICATIONS:   These medications were titrated to patient response per physician's verbal order, Fentanyl 100 mcg IV, Versed 10 mg IV  DESCRIPTION OF PROCEDURE:   After the risks benefits and alternatives of the procedure were thoroughly explained, informed consent was obtained.  Digital rectal exam was performed and revealed small external hemorrhoids.   The LB [id number] endoscope was introduced through the anus and advanced to the cecum, which was identified by both the appendix and ileocecal valve, without limitations.  The quality of the prep was good, using MoviPrep.  The instrument was then slowly withdrawn as the colon was fully examined. <<PROCEDUREIMAGES>>  FINDINGS:  Two sessile polyps measuring 4 - 5 mm were found cecum and  sigmoid colon. Polyps were snared without cautery. Retrieval was successful.  A 2 mm sessile polyp was found in the ascending colon. The polyp was removed using cold biopsy forceps.  Mild diverticulosis was found in the sigmoid colon.  Small internal hemorrhoids were found.   Retroflexed views in the rectum revealed no other findings other than those already described.    The scope was then withdrawn from the cecum and the procedure completed.  COMPLICATIONS:  None ENDOSCOPIC IMPRESSION: 1) Two polyps in cecum and  sigmoid colon.  Removed and sent to pathology. 2) Sessile polyp in the ascending colon.  Removed and sent to pathology. 3) Mild diverticulosis in the sigmoid colon 4) Internal  hemorrhoids  RECOMMENDATIONS: 1) Await pathology results 2) High fiber diet. 3) If the polyp(s) removed today are proven to be adenomatous (pre-cancerous) polyps, you will need a colonoscopy in 3 years. Otherwise you should continue to follow colorectal cancer screening guidelines for "routine risk" patients with a colonoscopy in 10 years. 4) You will receive a letter within 1-2 weeks with the results of your biopsy as well as final recommendations. Please call my office if you have not received a letter after 3 weeks.  [name], MD  CC:  [name] [name] [name], DO  n. eSIGNED:   [name] at 07/07/2011 09:53 AM  [name], [name], [phone]

## 2011-07-07 NOTE — Patient Instructions (Signed)
Please refer to blue and green discharge instuction sheets.

## 2011-07-10 ENCOUNTER — Telehealth: Payer: Self-pay | Admitting: *Deleted

## 2011-07-10 NOTE — Telephone Encounter (Signed)

## 2011-07-14 ENCOUNTER — Encounter: Payer: Self-pay | Admitting: Internal Medicine

## 2011-08-15 DIAGNOSIS — M25559 Pain in unspecified hip: Secondary | ICD-10-CM | POA: Diagnosis not present

## 2011-08-15 DIAGNOSIS — T8489XA Other specified complication of internal orthopedic prosthetic devices, implants and grafts, initial encounter: Secondary | ICD-10-CM | POA: Diagnosis not present

## 2011-09-06 ENCOUNTER — Encounter: Payer: Self-pay | Admitting: Family

## 2011-09-06 ENCOUNTER — Ambulatory Visit (INDEPENDENT_AMBULATORY_CARE_PROVIDER_SITE_OTHER): Payer: Medicare Other | Admitting: Family

## 2011-09-06 DIAGNOSIS — N318 Other neuromuscular dysfunction of bladder: Secondary | ICD-10-CM

## 2011-09-06 DIAGNOSIS — R7303 Prediabetes: Secondary | ICD-10-CM

## 2011-09-06 DIAGNOSIS — N3281 Overactive bladder: Secondary | ICD-10-CM

## 2011-09-06 DIAGNOSIS — E119 Type 2 diabetes mellitus without complications: Secondary | ICD-10-CM

## 2011-09-06 DIAGNOSIS — M25559 Pain in unspecified hip: Secondary | ICD-10-CM

## 2011-09-06 DIAGNOSIS — I10 Essential (primary) hypertension: Secondary | ICD-10-CM

## 2011-09-06 DIAGNOSIS — R7309 Other abnormal glucose: Secondary | ICD-10-CM

## 2011-09-06 DIAGNOSIS — D649 Anemia, unspecified: Secondary | ICD-10-CM

## 2011-09-06 DIAGNOSIS — E785 Hyperlipidemia, unspecified: Secondary | ICD-10-CM

## 2011-09-06 DIAGNOSIS — M25551 Pain in right hip: Secondary | ICD-10-CM

## 2011-09-06 LAB — HEPATIC FUNCTION PANEL
ALT: 11 U/L (ref 0–35)
AST: 23 U/L (ref 0–37)
Albumin: 3.9 g/dL (ref 3.5–5.2)
Alkaline Phosphatase: 76 U/L (ref 39–117)
Indirect Bilirubin: 0.4 mg/dL (ref 0.0–0.9)
Total Protein: 6.8 g/dL (ref 6.0–8.3)

## 2011-09-06 LAB — BASIC METABOLIC PANEL WITH GFR
BUN: 16 mg/dL (ref 6–23)
CO2: 24 mEq/L (ref 19–32)
Calcium: 10.3 mg/dL (ref 8.4–10.5)
Creat: 0.9 mg/dL (ref 0.50–1.10)
GFR, Est African American: 76 mL/min
Glucose, Bld: 94 mg/dL (ref 70–99)

## 2011-09-06 MED ORDER — FLUTICASONE PROPIONATE 50 MCG/ACT NA SUSP: 2.0000 | Freq: Every day | NASAL | Status: DC

## 2011-09-06 MED ORDER — OXYBUTYNIN CHLORIDE 5 MG PO TABS: 5.0000 mg | ORAL_TABLET | Freq: Two times a day (BID) | ORAL | Status: DC

## 2011-09-06 NOTE — Assessment & Plan Note (Signed)
Obtain CBC 

## 2011-09-06 NOTE — Progress Notes (Signed)
Subjective:    Patient ID: [name], female    DOB: 1941-02-17, 71 y.o.   MRN: [id number]  HPI  Ms. Raisanen is a 71 yr old female who presents today for follow up.   HTN- She continues to take 1/2 tab of hyzaar and remains off of norvasc.   Hip pain- She reports that she saw Dr. Turner Daniels (ortho).  She is scheduled for follow up this summer and will likely have her Dupuy hip replaced.  She noted that her hip pain was worse when she was on the fosamax so she stopped this.  Pain is now improved.   Urinary frequency- notes that she used to have problems with urinary frequency prior to starting the vaginal estrogen which was prescribed by Colman Cater (gyn).  This has improved.    Review of Systems    see HPI  Past Medical History  Diagnosis Date  . Hypertension   . Obesity   . History of breast cancer   . Allergic rhinitis   . Osteoarthritis     severe right hip osteoarthritis-s/p hip replacement  . 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 Osteopenia 11/26/2009    History   Social History  . Marital Status: Married    Spouse Name: N/A    Number of Children: N/A  . Years of Education: N/A   Occupational History  . Not on file.   Social History Main Topics  . Smoking status: Former Smoker -- 10 years    Types: Cigarettes    Quit date: 07/14/1974  . Smokeless tobacco: Never Used   Comment: quit in 1976 smoked for approx. 10 years  . Alcohol Use: Yes     drinks wine with dinner  . Drug Use: No  . Sexually Active: Not on file   Other Topics Concern  . Not on file   Social History Narrative   RetiredThe patient is married and has one child and two grandchildren that live in the area.  She drinks wine with dinner.  She quit smoking in 1976, smoked forapproximately 10 years.   Moved to [place] from [place]    Past Surgical History  Procedure Date  . Total hip arthroplasty     right hip   . Mastectomy     bilateral mastectomy  . Tonsillectomy 1949  .  Eye surgery 2011    cataract removal both eyes    Family History  Problem Relation Age of Onset  . Heart failure Father     deceased age 69  . Diabetes Father   . Alcohol abuse Father   . Breast cancer Mother     deceased at age 72 secondary to breast cancer    Allergies  Allergen Reactions  . Sulfonamide Derivatives     REACTION: Swelling of the face    Current Outpatient Prescriptions on File Prior to Visit  Medication Sig Dispense Refill  . bisoprolol (ZEBETA) 10 MG tablet Take 1 tablet (10 mg total) by mouth daily.  90 tablet  3  . Cholecalciferol (VITAMIN D3) 2000 UNITS TABS Take 2 capsules by mouth.        . estradiol (VAGIFEM) 25 MCG vaginal tablet Place 25 mcg vaginally 2 (two) times a week.        . fexofenadine (ALLEGRA) 180 MG tablet Take 180 mg by mouth daily as needed.       . Gelatin Capsules, Empty, CAPS 2 (two) times daily.        Marland Kitchen losartan-hydrochlorothiazide (  HYZAAR) 50-12.5 MG per tablet Take 0.5 tablets by mouth daily. One tablet by mouth daily      . Omega-3 Fatty Acids (FISH OIL) 1000 MG CAPS Take 1,000 mg by mouth daily.       . simvastatin (ZOCOR) 10 MG tablet Take 1 tablet (10 mg total) by mouth at bedtime.  90 tablet  3   Current Facility-Administered Medications on File Prior to Visit  Medication Dose Route Frequency Provider Last Rate Last Dose  . 0.9 %  sodium chloride infusion  500 mL Intravenous Continuous Erick Blinks, MD        BP 150/96  Pulse 68  Temp(Src) 97.7 F (36.5 C) (Oral)  Resp 18  Wt 211 lb 1.3 oz (95.745 kg)  SpO2 99%  LMP 07/11/1991    Objective:   Physical Exam  Constitutional: She appears well-developed and well-nourished. No distress.  Cardiovascular: Normal rate and regular rhythm.   No murmur heard. Pulmonary/Chest: Effort normal and breath sounds normal. No respiratory distress. She has no wheezes. She has no rales.  Musculoskeletal:       2+ bilateral LE edema.   Psychiatric: She has a normal mood and affect.  Her behavior is normal. Judgment and thought content normal.          Assessment & Plan:   BP Readings from Last 3 Encounters:  09/06/11 150/96  07/07/11 141/77  06/14/11 139/82

## 2011-09-06 NOTE — Assessment & Plan Note (Signed)
With her hx of Breast cancer, I am uncomfortable with her being on an estrogen product.  Will stop estrogen and add trial of ditropan.

## 2011-09-06 NOTE — Assessment & Plan Note (Addendum)
Obtain A1C.  Currently diet controlled.

## 2011-09-06 NOTE — Assessment & Plan Note (Signed)
Defer management to orthopedics.  

## 2011-09-06 NOTE — Assessment & Plan Note (Signed)
Deteriorated.  Increase Hyzaar to a full tab once daily, continue bisoprolol.

## 2011-09-06 NOTE — Patient Instructions (Signed)
Please complete your blood work prior to leaving today. Follow up in 1 month. 

## 2011-09-07 LAB — CBC WITH DIFFERENTIAL/PLATELET
Basophils Relative: 1 % (ref 0–1)
Eosinophils Absolute: 0.7 10*3/uL (ref 0.0–0.7)
Eosinophils Relative: 6 % — ABNORMAL HIGH (ref 0–5)
HCT: 33.6 % — ABNORMAL LOW (ref 36.0–46.0)
Lymphs Abs: 2.6 10*3/uL (ref 0.7–4.0)
Monocytes Relative: 8 % (ref 3–12)
Neutrophils Relative %: 65 % (ref 43–77)
RDW: 15.7 % — ABNORMAL HIGH (ref 11.5–15.5)
WBC: 12.3 10*3/uL — ABNORMAL HIGH (ref 4.0–10.5)

## 2011-09-11 ENCOUNTER — Telehealth: Payer: Self-pay | Admitting: Family

## 2011-09-11 NOTE — Telephone Encounter (Signed)
Message copied by [name] on Mon Sep 11, 2011  1:30 PM ------      Message from: [name]      Created: Fri Sep 08, 2011  6:00 PM      Regarding: hip      Contact: [phone]       [name] has a recalled metal on metal ASR hip from [employer]. This hip was implanted in 2007 by Dr. Jason Nest referred her to me because of right hip pain with an elevated cobalt level of 23 and August of 2012. Her artifact reduction MRI scan from December of 2012 shows a fluid collection consistent with a metal ion reaction from her total hip. 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 On her last visit 15 August 2011, her pain had diminished and she deferred on any revision surgery.  If her pain increasesor she runs a fever she's been instructed to see me at any time, otherwise her next scheduled appointment is in August of 2012. By itself her white count of 12.5 is probably not associated with her recalled hip, unless she also has markedly increasing pain, fever, and/or difficulty ambulating.      ----- Message -----         From: Courtney Blazing. [name], NP         Sent: 09/08/2011  10:57 AM           To: [name], MD            Dr. Turner Daniels,            I believe that you saw [name] in consultation for her R hip pain (She is s/p R THR).  MRI noted fluid collection on hip.  I performed routine lab work yesterday and note a mild elevation in her white count- 12.3.  Her white count was also elevated over the summer for unknown reason. Do you think that her hip may be infected?  I can be reached at [phone].              Thanks,            Courtney Craze, NP

## 2011-09-12 ENCOUNTER — Telehealth: Payer: Self-pay | Admitting: *Deleted

## 2011-09-12 DIAGNOSIS — D72829 Elevated white blood cell count, unspecified: Secondary | ICD-10-CM

## 2011-09-12 DIAGNOSIS — D509 Iron deficiency anemia, unspecified: Secondary | ICD-10-CM

## 2011-09-12 NOTE — Telephone Encounter (Signed)
Message copied by Kathi Simpers on Tue Sep 12, 2011  3:04 PM ------      Message from: O'SULLIVAN, [name]      Created: Mon Sep 11, 2011 12:58 PM       Pls call pt and let her know that her anemia looks improved. Sugar stable, liver function tests normal.  Her white blood cell count is up slightly.  I would like to repeat CBC with diff (diagnosis leukocytosis) please in 2 weeks. She should let us know if she has fever in the meantime.

## 2011-09-12 NOTE — Telephone Encounter (Signed)
Pt notified and will return to the lab the week of 09/26/11 (future lab order placed) and f/u with [name] on 10/03/11 as scheduled.

## 2011-09-27 DIAGNOSIS — D72829 Elevated white blood cell count, unspecified: Secondary | ICD-10-CM | POA: Diagnosis not present

## 2011-09-27 LAB — CBC WITH DIFFERENTIAL/PLATELET
Basophils Relative: 1 % (ref 0–1)
Eosinophils Absolute: 0.6 10*3/uL (ref 0.0–0.7)
Lymphs Abs: 2.9 10*3/uL (ref 0.7–4.0)
MCH: 28 pg (ref 26.0–34.0)
Neutro Abs: 6.6 10*3/uL (ref 1.7–7.7)
Neutrophils Relative %: 60 % (ref 43–77)
Platelets: 382 10*3/uL (ref 150–400)
RBC: 4.03 MIL/uL (ref 3.87–5.11)

## 2011-09-27 NOTE — Telephone Encounter (Signed)
Addended by: Mervin Kung A on: 09/27/2011 12:09 PM   Modules accepted: Orders

## 2011-09-27 NOTE — Telephone Encounter (Signed)
Pt presented to the lab, order released and forwarded to the lab.

## 2011-09-29 ENCOUNTER — Encounter: Payer: Self-pay | Admitting: Family

## 2011-10-04 ENCOUNTER — Ambulatory Visit (INDEPENDENT_AMBULATORY_CARE_PROVIDER_SITE_OTHER): Payer: Medicare Other | Admitting: Family

## 2011-10-04 ENCOUNTER — Encounter: Payer: Self-pay | Admitting: Family

## 2011-10-04 VITALS — BP 144/82 | HR 61 | Temp 97.8°F | Resp 18 | Ht 67.0 in | Wt 207.0 lb

## 2011-10-04 DIAGNOSIS — N318 Other neuromuscular dysfunction of bladder: Secondary | ICD-10-CM

## 2011-10-04 DIAGNOSIS — R3129 Other microscopic hematuria: Secondary | ICD-10-CM | POA: Diagnosis not present

## 2011-10-04 DIAGNOSIS — N3281 Overactive bladder: Secondary | ICD-10-CM

## 2011-10-04 DIAGNOSIS — I10 Essential (primary) hypertension: Secondary | ICD-10-CM

## 2011-10-04 LAB — POCT URINALYSIS DIPSTICK
Bilirubin, UA: NEGATIVE
Glucose, UA: NEGATIVE
Nitrite, UA: NEGATIVE

## 2011-10-04 LAB — BASIC METABOLIC PANEL
CO2: 24 mEq/L (ref 19–32)
Calcium: 10.8 mg/dL — ABNORMAL HIGH (ref 8.4–10.5)
Sodium: 136 mEq/L (ref 135–145)

## 2011-10-04 MED ORDER — CIPROFLOXACIN HCL 250 MG PO TABS: 250.0000 mg | ORAL_TABLET | Freq: Two times a day (BID) | ORAL | Status: AC

## 2011-10-04 NOTE — Patient Instructions (Signed)
Please follow up in 3 months.  

## 2011-10-04 NOTE — Progress Notes (Signed)
Subjective:    Patient ID: [name], female    DOB: 1941-06-25, 71 y.o.   MRN: [id number]  HPI  [name] is a 71 yr old female who presents today for follow up.   1) Overactive bladder- had gi side effects with ditropan.  She stopped due to the GI side effects.  Still having urinary frequency, some lower abdominal cramping and occasional dysuria. Reports + stress incontinence. She is now off of the vaginal estrogen.  She tells me that that she is not sexually active recently due to her hip.  BP Readings from Last 3 Encounters:  10/04/11 144/82  09/06/11 150/96  07/07/11 141/77   2) HTN- Pt reports tolerating the increased dose of Hyzaar.  Feels better.   Review of Systems See HPI  Past Medical History  Diagnosis Date  . Hypertension   . Obesity   . History of breast cancer   . Allergic rhinitis   . Osteoarthritis     severe right hip osteoarthritis-s/p hip replacement  . 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 Osteopenia 11/26/2009    History   Social History  . Marital Status: Married    Spouse Name: N/A    Number of Children: N/A  . Years of Education: N/A   Occupational History  . Not on file.   Social History Main Topics  . Smoking status: Former Smoker -- 10 years    Types: Cigarettes    Quit date: 07/14/1974  . Smokeless tobacco: Never Used   Comment: quit in 1976 smoked for approx. 10 years  . Alcohol Use: Yes     drinks wine with dinner  . Drug Use: No  . Sexually Active: Not on file   Other Topics Concern  . Not on file   Social History Narrative   RetiredThe patient is married and has one child and two grandchildren that live in the area.  She drinks wine with dinner.  She quit smoking in 1976, smoked forapproximately 10 years.   Moved to [place] from [place]    Past Surgical History  Procedure Date  . Total hip arthroplasty     right hip   . Mastectomy     bilateral mastectomy  . Tonsillectomy 1949  . Eye surgery 2011      cataract removal both eyes    Family History  Problem Relation Age of Onset  . Heart failure Father     deceased age 75  . Diabetes Father   . Alcohol abuse Father   . Breast cancer Mother     deceased at age 13 secondary to breast cancer    Allergies  Allergen Reactions  . Sulfonamide Derivatives     REACTION: Swelling of the face    Current Outpatient Prescriptions on File Prior to Visit  Medication Sig Dispense Refill  . bisoprolol (ZEBETA) 10 MG tablet Take 1 tablet (10 mg total) by mouth daily.  90 tablet  3  . Cholecalciferol (VITAMIN D3) 2000 UNITS TABS Take 2 capsules by mouth.        . fexofenadine (ALLEGRA) 180 MG tablet Take 180 mg by mouth daily as needed.       . fluticasone (FLONASE) 50 MCG/ACT nasal spray Place 2 sprays into the nose daily.  48 g  1  . Gelatin Capsules, Empty, CAPS 2 (two) times daily.        Marland Kitchen losartan-hydrochlorothiazide (HYZAAR) 50-12.5 MG per tablet Take 0.5 tablets by mouth daily. One tablet by mouth  daily      . Omega-3 Fatty Acids (FISH OIL) 1000 MG CAPS Take 1,000 mg by mouth daily.       . simvastatin (ZOCOR) 10 MG tablet Take 1 tablet (10 mg total) by mouth at bedtime.  90 tablet  3   Current Facility-Administered Medications on File Prior to Visit  Medication Dose Route Frequency Provider Last Rate Last Dose  . 0.9 %  sodium chloride infusion  500 mL Intravenous Continuous Beverley Fiedler, MD        BP 144/82  Pulse 61  Temp(Src) 97.8 F (36.6 C) (Oral)  Resp 18  Ht 5\' 7"  (1.702 m)  Wt 207 lb 0.6 oz (93.913 kg)  BMI 32.43 kg/m2  SpO2 99%  LMP 07/11/1991       Objective:   Physical Exam  Constitutional: She appears well-developed and well-nourished. No distress.  Cardiovascular: Normal rate and regular rhythm.   No murmur heard. Pulmonary/Chest: Effort normal and breath sounds normal. No respiratory distress. She has no wheezes. She has no rales. She exhibits no tenderness.  Psychiatric: She has a normal mood and  affect. Her behavior is normal. Judgment and thought content normal.          Assessment & Plan:

## 2011-10-04 NOTE — Assessment & Plan Note (Addendum)
She is now on a full dose of Hyzaar and reports that she is tolerating this dose with out difficulty. BP is improved.  Continue same. Obtain BMET.

## 2011-10-04 NOTE — Assessment & Plan Note (Signed)
Not tolerating ditropan.  Will refer to Urology for further evaluation.

## 2011-10-05 LAB — URINE CULTURE: Organism ID, Bacteria: NO GROWTH

## 2011-10-10 ENCOUNTER — Encounter: Payer: Self-pay | Admitting: Family

## 2011-10-10 DIAGNOSIS — E213 Hyperparathyroidism, unspecified: Secondary | ICD-10-CM | POA: Insufficient documentation

## 2011-10-11 ENCOUNTER — Telehealth: Payer: Self-pay | Admitting: *Deleted

## 2011-10-11 NOTE — Telephone Encounter (Signed)
Pt presented to the lab for PTH level. Order released and forwarded to the lab.

## 2011-10-11 NOTE — Telephone Encounter (Signed)
Pt notified and will return to the lab one day this week. Future lab order placed and forwarded to the lab.

## 2011-10-11 NOTE — Telephone Encounter (Signed)
Message copied by Kathi Simpers on Wed Oct 11, 2011 10:12 AM ------      Message from: O'SULLIVAN, [name]      Created: Tue Oct 10, 2011 10:34 AM       Please call pt and let her know that her calcium is slightly elevated.  I would like for her to return to the lab for PTH (hypercalcium) to further evalute.  Urine culture is negative.

## 2011-10-11 NOTE — Telephone Encounter (Signed)
Addended by: Mervin Kung A on: 10/11/2011 01:27 PM   Modules accepted: Orders

## 2011-10-12 LAB — PTH, INTACT AND CALCIUM: PTH: 91.8 pg/mL — ABNORMAL HIGH (ref 14.0–72.0)

## 2011-10-18 ENCOUNTER — Telehealth: Payer: Self-pay | Admitting: Family

## 2011-10-18 NOTE — Telephone Encounter (Signed)
Pt returned call and left message to call her back. Attempted to reach pt and left message to return my call.

## 2011-10-18 NOTE — Assessment & Plan Note (Signed)
PTH is elevated.  Plan referral to Endo for further evaluation/treatment.

## 2011-10-18 NOTE — Telephone Encounter (Signed)
Pt notified and agreeable to proceed with referral.

## 2011-10-18 NOTE — Telephone Encounter (Signed)
Left message requesting that pt return our call.  When pt calls back, pls let her know that parathyroid hormone was elevated slightly.  Could indicated that her parathyroid glands are overactive which could be causing the mild elevation of her calcium.  I would like to refer her to endocrinology.  Referral pended below.

## 2011-10-25 ENCOUNTER — Encounter: Payer: Self-pay | Admitting: Family

## 2011-10-31 ENCOUNTER — Other Ambulatory Visit: Payer: Self-pay | Admitting: Family

## 2011-10-31 DIAGNOSIS — N318 Other neuromuscular dysfunction of bladder: Secondary | ICD-10-CM | POA: Diagnosis not present

## 2011-10-31 DIAGNOSIS — R300 Dysuria: Secondary | ICD-10-CM | POA: Diagnosis not present

## 2011-10-31 NOTE — Telephone Encounter (Signed)
OK to send #45 with no refills.

## 2011-10-31 NOTE — Telephone Encounter (Signed)
Please advise if ok to refill Tramadol and # of refills. Pt last got rx in November.

## 2011-10-31 NOTE — Telephone Encounter (Signed)
Refill sent to pharmacy as below. Pt had called and left voice message that she has been taking these for her "bladder" pain that she has been having and it seems to be helping.

## 2011-11-07 ENCOUNTER — Other Ambulatory Visit: Payer: Self-pay | Admitting: Family

## 2011-11-15 DIAGNOSIS — I10 Essential (primary) hypertension: Secondary | ICD-10-CM | POA: Diagnosis not present

## 2011-11-15 DIAGNOSIS — E210 Primary hyperparathyroidism: Secondary | ICD-10-CM | POA: Diagnosis not present

## 2011-12-05 ENCOUNTER — Ambulatory Visit: Payer: Medicare Other | Admitting: Family

## 2011-12-06 ENCOUNTER — Encounter: Payer: Self-pay | Admitting: Family

## 2011-12-06 ENCOUNTER — Ambulatory Visit (INDEPENDENT_AMBULATORY_CARE_PROVIDER_SITE_OTHER): Payer: Medicare Other | Admitting: Family

## 2011-12-06 VITALS — BP 150/90 | HR 69 | Temp 98.1°F | Resp 16 | Wt 211.0 lb

## 2011-12-06 DIAGNOSIS — I10 Essential (primary) hypertension: Secondary | ICD-10-CM | POA: Diagnosis not present

## 2011-12-06 DIAGNOSIS — G8929 Other chronic pain: Secondary | ICD-10-CM

## 2011-12-06 DIAGNOSIS — R109 Unspecified abdominal pain: Secondary | ICD-10-CM

## 2011-12-06 LAB — BASIC METABOLIC PANEL
BUN: 14 mg/dL (ref 6–23)
Creat: 0.81 mg/dL (ref 0.50–1.10)

## 2011-12-06 MED ORDER — LOSARTAN POTASSIUM 100 MG PO TABS: 100.0000 mg | ORAL_TABLET | Freq: Every day | ORAL | Status: DC

## 2011-12-06 NOTE — Assessment & Plan Note (Signed)
Will obtain CT abd/pelvis to further evaluate.  Given hx of breast CA, I would like to exclude underlying malignancy. Obtain baseline creatinine for contrast purposes.

## 2011-12-06 NOTE — Assessment & Plan Note (Signed)
Deteriorated.  Continue off of low dose Hctz duet to improvement urinary symptoms.  Increase losartan from 50 to 100mg .

## 2011-12-06 NOTE — Patient Instructions (Signed)
Complete your blood work prior to leaving.  CT will be scheduled on the first floor.  Follow up in 2 weeks.

## 2011-12-06 NOTE — Progress Notes (Signed)
Subjective:    Patient ID: [name], female    DOB: Mar 23, 1941, 71 y.o.   MRN: [id number]  HPI  [name] is a 71 yr old female with hxof breast cancer who presents today with chief complaint of lower abdominal cramping- intermittent. "like menstrual cramps."  She reports that if she gets too hungry, symptoms are worse.  She has not had a hysterectomy.  Last pap was 3 yrs ago.  BM's intermittent   Sometimes loose.  Denies black/bloody stools.  Had colo this year which noted tubular adenoma.    Overactive bladder- She tried enablex which helped some, but was too expensive.    HTN- she tells me that Dr. Allena Katz stopped her HCTZ 1 month ago.  BP is up today.  She reports no significant worsening of her LE edema with this change but does reports that her urinary frequency has improved.  Review of Systems See HPI  Past Medical History  Diagnosis Date  . Hypertension   . Obesity   . History of breast cancer   . Allergic rhinitis   . Osteoarthritis     severe right hip osteoarthritis-s/p hip replacement  . 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 Osteopenia 11/26/2009    History   Social History  . Marital Status: Married    Spouse Name: N/A    Number of Children: N/A  . Years of Education: N/A   Occupational History  . Not on file.   Social History Main Topics  . Smoking status: Former Smoker -- 10 years    Types: Cigarettes    Quit date: 07/14/1974  . Smokeless tobacco: Never Used   Comment: quit in 1976 smoked for approx. 10 years  . Alcohol Use: Yes     drinks wine with dinner  . Drug Use: No  . Sexually Active: Not on file   Other Topics Concern  . Not on file   Social History Narrative   RetiredThe patient is married and has one child and two grandchildren that live in the area.  She drinks wine with dinner.  She quit smoking in 1976, smoked forapproximately 10 years.   Moved to [place] from [place]    Past Surgical History  Procedure Date  .  Total hip arthroplasty     right hip   . Mastectomy     bilateral mastectomy  . Tonsillectomy 1949  . Eye surgery 2011    cataract removal both eyes    Family History  Problem Relation Age of Onset  . Heart failure Father     deceased age 46  . Diabetes Father   . Alcohol abuse Father   . Breast cancer Mother     deceased at age 65 secondary to breast cancer    Allergies  Allergen Reactions  . Sulfonamide Derivatives     REACTION: Swelling of the face    Current Outpatient Prescriptions on File Prior to Visit  Medication Sig Dispense Refill  . bisoprolol (ZEBETA) 10 MG tablet TAKE 1 TABLET DAILY  90 tablet  1  . Cholecalciferol (VITAMIN D3) 2000 UNITS TABS Take 2 capsules by mouth.        . fexofenadine (ALLEGRA) 180 MG tablet Take 180 mg by mouth daily as needed.       . fluticasone (FLONASE) 50 MCG/ACT nasal spray Place 2 sprays into the nose daily.  48 g  1  . Gelatin Capsules, Empty, CAPS 2 (two) times daily.        Marland Kitchen  Omega-3 Fatty Acids (FISH OIL) 1000 MG CAPS Take 1,000 mg by mouth daily.       . simvastatin (ZOCOR) 10 MG tablet Take 1 tablet (10 mg total) by mouth at bedtime.  90 tablet  3  . traMADol (ULTRAM) 50 MG tablet TAKE 1 TABLET BY MOUTH EVERY 8 HOURS AS NEEDED FOR PAIN. MAXIMUM DOSE=8 TABLETS PER DAY  45 tablet  0  . losartan (COZAAR) 100 MG tablet Take 1 tablet (100 mg total) by mouth daily.  30 tablet  2  . DISCONTD: oxybutynin (DITROPAN) 5 MG tablet Take 1 tablet (5 mg total) by mouth 2 (two) times daily.  60 tablet  2   Current Facility-Administered Medications on File Prior to Visit  Medication Dose Route Frequency Provider Last Rate Last Dose  . 0.9 %  sodium chloride infusion  500 mL Intravenous Continuous Beverley Fiedler, MD        BP 150/90  Pulse 69  Temp(Src) 98.1 F (36.7 C) (Oral)  Resp 16  Wt 211 lb 0.6 oz (95.727 kg)  SpO2 99%  LMP 07/11/1991       Objective:   Physical Exam  Constitutional: She appears well-developed and  well-nourished. No distress.  Cardiovascular: Normal rate and regular rhythm.   No murmur heard. Pulmonary/Chest: Effort normal and breath sounds normal. No respiratory distress. She has no wheezes. She has no rales. She exhibits no tenderness.  Musculoskeletal:       2-3+ bilateral LE edema.   Psychiatric: She has a normal mood and affect. Her speech is normal and behavior is normal. Judgment and thought content normal. Cognition and memory are normal.          Assessment & Plan:

## 2011-12-08 ENCOUNTER — Encounter: Payer: Self-pay | Admitting: Family

## 2011-12-08 ENCOUNTER — Telehealth: Payer: Self-pay | Admitting: Family

## 2011-12-08 ENCOUNTER — Ambulatory Visit (HOSPITAL_BASED_OUTPATIENT_CLINIC_OR_DEPARTMENT_OTHER)
Admission: RE | Admit: 2011-12-08 | Discharge: 2011-12-08 | Disposition: A | Discharge status: Home / Self Care | Payer: Medicare Other | Source: Ambulatory Visit | Attending: Family | Admitting: Family

## 2011-12-08 DIAGNOSIS — G8929 Other chronic pain: Secondary | ICD-10-CM | POA: Diagnosis not present

## 2011-12-08 DIAGNOSIS — M4310 Spondylolisthesis, site unspecified: Secondary | ICD-10-CM | POA: Insufficient documentation

## 2011-12-08 DIAGNOSIS — R1031 Right lower quadrant pain: Secondary | ICD-10-CM | POA: Diagnosis not present

## 2011-12-08 MED ORDER — IOHEXOL 300 MG/ML  SOLN: 100.0000 mL | Freq: Once | INTRAMUSCULAR | Status: AC | PRN

## 2011-12-08 NOTE — Telephone Encounter (Signed)
Spoke with pt. She reports that her abdominal pain is feeling better last few days. She will hold off on GI referral and let me know if her symptoms worsen and she decides to proceed with referral.

## 2011-12-08 NOTE — Telephone Encounter (Signed)
Left message for pt to return call. Reviewed abdominal CT- no abnormalities to explain her abdominal pain.  Could be irritable bowel causing discomfort.   If symptoms persist or worsen, we can consider referral to GI.  She can let me know if she wishes to proceed with referral.

## 2011-12-08 NOTE — Telephone Encounter (Signed)
Left message on home # to return my call. 

## 2011-12-11 DIAGNOSIS — E210 Primary hyperparathyroidism: Secondary | ICD-10-CM | POA: Diagnosis not present

## 2011-12-13 ENCOUNTER — Ambulatory Visit: Payer: PRIVATE HEALTH INSURANCE | Admitting: Internal Medicine

## 2011-12-13 ENCOUNTER — Other Ambulatory Visit (HOSPITAL_BASED_OUTPATIENT_CLINIC_OR_DEPARTMENT_OTHER): Payer: Medicare Other | Admitting: Lab

## 2011-12-13 ENCOUNTER — Encounter: Payer: Medicare Other | Admitting: Internal Medicine

## 2011-12-13 ENCOUNTER — Ambulatory Visit (HOSPITAL_BASED_OUTPATIENT_CLINIC_OR_DEPARTMENT_OTHER): Payer: Medicare Other | Admitting: Hematology & Oncology

## 2011-12-13 VITALS — BP 148/81 | HR 66 | Temp 97.1°F | Ht 67.0 in | Wt 208.0 lb

## 2011-12-13 DIAGNOSIS — Z853 Personal history of malignant neoplasm of breast: Secondary | ICD-10-CM

## 2011-12-13 DIAGNOSIS — D638 Anemia in other chronic diseases classified elsewhere: Secondary | ICD-10-CM

## 2011-12-13 DIAGNOSIS — D509 Iron deficiency anemia, unspecified: Secondary | ICD-10-CM

## 2011-12-13 DIAGNOSIS — D0511 Intraductal carcinoma in situ of right breast: Secondary | ICD-10-CM

## 2011-12-13 LAB — IRON AND TIBC: Iron: 53 ug/dL (ref 42–145)

## 2011-12-13 LAB — CBC WITH DIFFERENTIAL (CANCER CENTER ONLY)
BASO#: 0.1 10*3/uL (ref 0.0–0.2)
BASO%: 0.6 % (ref 0.0–2.0)
EOS%: 3.7 % (ref 0.0–7.0)
HGB: 11.3 g/dL — ABNORMAL LOW (ref 11.6–15.9)
MCH: 29.7 pg (ref 26.0–34.0)
MCHC: 33 g/dL (ref 32.0–36.0)
MONO%: 7.7 % (ref 0.0–13.0)
NEUT#: 6.8 10*3/uL — ABNORMAL HIGH (ref 1.5–6.5)
Platelets: 340 10*3/uL (ref 145–400)
RDW: 13.7 % (ref 11.1–15.7)

## 2011-12-13 LAB — FERRITIN: Ferritin: 91 ng/mL (ref 10–291)

## 2011-12-13 LAB — RETICULOCYTES (CHCC): Retic Ct Pct: 1.2 % (ref 0.4–2.3)

## 2011-12-13 NOTE — Progress Notes (Signed)
This office note has been dictated.

## 2011-12-14 ENCOUNTER — Telehealth: Payer: Self-pay | Admitting: Hematology & Oncology

## 2011-12-14 NOTE — Telephone Encounter (Signed)
Mailed 06-14-12 schedule

## 2011-12-14 NOTE — Progress Notes (Signed)
CC:   [name]. Artist Pais, DO  DIAGNOSES: 1. Anemia of chronic disease. 2. Remote history of ductal carcinoma in situ of the right breast.  CURRENT THERAPY:  Observation.  INTERVAL HISTORY:  [name] comes in for followup.  She is doing okay. She has had no specific problems since we last saw her.  She has had no problems with any kind of bleeding or bruising.  She has had no cough. She has had no nausea or vomiting.  She is still watching her weight.  She was seen over at the [facility] by the nutritionist.  She has had no fevers, sweats or chills.  She was having some problems with abdominal pain.  She actually did have a CT scan done back in I think May.  CT scan really was unremarkable. She did have some inflammatory changes associated with the right hip prosthetic.  Currently, she has no abdominal discomfort.  PHYSICAL EXAM:  This is a well-developed, well-nourished, white female, somewhat obese.  Vital signs:  Temperature 97.7, pulse 66, respiratory rate 20, blood pressure 148/81, weight is 208.  Head/Neck:  A normocephalic, atraumatic skull.  There are no ocular or oral lesions. There are no palpable cervical or supraclavicular lymph nodes.  Lungs: Clear to percussion and auscultation bilaterally.  Cardiac:  Regular rate and rhythm with a normal S1, S2.  There are no murmurs, rubs or bruits.  Abdomen:  Soft with good bowel sounds.  There is no palpable abdominal mass.  There is no palpable hepatosplenomegaly.  Back:  No tenderness over the spine, ribs, or hips.  Extremities:  No clubbing, cyanosis or edema.  LABORATORY STUDIES:  White cell count is 10.8, hemoglobin 11.3, hematocrit 34.2, platelet count 340.  Ferritin is 91.  Iron saturation 17%.  IMPRESSION:  [name] is a 71 year old white female with a history of anemia of chronic disease.  Everything looks fine from my point of view right now.  She certainly has mild anemia.  Her iron studies might  be borderline, but I do not think we really need to do anything with this. She is asymptomatic.  We will plan to get her back in another 6 months for followup.  I do not see that we need any blood work in between visits.    ______________________________ [name], M.D. PRE/MEDQ  D:  12/13/2011  T:  12/14/2011  Job:  [id number]

## 2011-12-20 ENCOUNTER — Ambulatory Visit: Payer: Medicare Other | Admitting: Family

## 2011-12-27 ENCOUNTER — Ambulatory Visit (INDEPENDENT_AMBULATORY_CARE_PROVIDER_SITE_OTHER): Payer: Medicare Other | Admitting: Family

## 2011-12-27 ENCOUNTER — Ambulatory Visit: Payer: Medicare Other | Admitting: Family

## 2011-12-27 ENCOUNTER — Encounter: Payer: Self-pay | Admitting: Family

## 2011-12-27 VITALS — BP 130/80 | HR 64 | Temp 97.9°F | Resp 16 | Ht 67.0 in | Wt 207.0 lb

## 2011-12-27 DIAGNOSIS — I10 Essential (primary) hypertension: Secondary | ICD-10-CM

## 2011-12-27 LAB — BASIC METABOLIC PANEL
Glucose, Bld: 103 mg/dL — ABNORMAL HIGH (ref 70–99)
Potassium: 4.3 mEq/L (ref 3.5–5.3)
Sodium: 138 mEq/L (ref 135–145)

## 2011-12-27 NOTE — Assessment & Plan Note (Signed)
BP Readings from Last 3 Encounters:  12/27/11 130/80  12/13/11 148/81  12/06/11 150/90   BP looks great today.  Continue bisoprolol and increased dose of losartan. Obtain BMET.

## 2011-12-27 NOTE — Progress Notes (Signed)
  Subjective:    Patient ID: [name], female    DOB: [birth year], 71 y.o.   MRN: [id number]  HPI  [name] is a 71 yr old female who presents today for follow up of her blood pressure.  Last visit her losartan was increased from 50mg  to 100mg  and HCTZ was discontinued. Since that time, she reports feeling much better. She denies chest pain, or shortness of breath.  She does have some mild edema since discontinuing the HCTZ, but she is appreciating the decrease in her overactive bladder symptoms since this medication was discontinued and has been elevating her legs prn with good relief of the swelling.    Review of Systems    see HPI  Objective:   Physical Exam  Constitutional: She appears well-developed and well-nourished. No distress.  Cardiovascular: Normal rate and regular rhythm.   No murmur heard. Pulmonary/Chest: Effort normal and breath sounds normal. No respiratory distress. She has no wheezes. She has no rales. She exhibits no tenderness.  Musculoskeletal:       2+ bilateral LE edema.           Assessment & Plan:

## 2011-12-27 NOTE — Patient Instructions (Addendum)
Please schedule a follow up appointment in 3 months.

## 2011-12-28 ENCOUNTER — Encounter: Payer: Self-pay | Admitting: Family

## 2011-12-28 DIAGNOSIS — R739 Hyperglycemia, unspecified: Secondary | ICD-10-CM | POA: Insufficient documentation

## 2012-01-19 DIAGNOSIS — Z961 Presence of intraocular lens: Secondary | ICD-10-CM | POA: Diagnosis not present

## 2012-01-19 DIAGNOSIS — H43819 Vitreous degeneration, unspecified eye: Secondary | ICD-10-CM | POA: Diagnosis not present

## 2012-01-19 DIAGNOSIS — H04129 Dry eye syndrome of unspecified lacrimal gland: Secondary | ICD-10-CM | POA: Diagnosis not present

## 2012-01-19 DIAGNOSIS — H52209 Unspecified astigmatism, unspecified eye: Secondary | ICD-10-CM | POA: Diagnosis not present

## 2012-02-15 ENCOUNTER — Other Ambulatory Visit: Payer: Self-pay | Admitting: *Deleted

## 2012-02-15 MED ORDER — LOSARTAN POTASSIUM 100 MG PO TABS: 100.0000 mg | ORAL_TABLET | Freq: Every day | ORAL | Status: DC

## 2012-02-15 NOTE — Telephone Encounter (Signed)
Received fax from [facility] for refill of pt's losartan. Form completed (#90 x 1 refill) and faxed to [phone].

## 2012-03-05 ENCOUNTER — Other Ambulatory Visit: Payer: Self-pay | Admitting: Family

## 2012-03-05 NOTE — Telephone Encounter (Signed)
Tramadol refill sent to [facility] #[facility] x no refills. Rx last filled 10/31/11. Notified pt of completion.

## 2012-03-14 DIAGNOSIS — M25559 Pain in unspecified hip: Secondary | ICD-10-CM | POA: Diagnosis not present

## 2012-03-19 ENCOUNTER — Other Ambulatory Visit (HOSPITAL_COMMUNITY): Payer: Self-pay | Admitting: Orthopedic Surgery

## 2012-03-19 DIAGNOSIS — M25551 Pain in right hip: Secondary | ICD-10-CM

## 2012-03-20 ENCOUNTER — Ambulatory Visit: Payer: Medicare Other | Admitting: Family

## 2012-03-22 ENCOUNTER — Ambulatory Visit (INDEPENDENT_AMBULATORY_CARE_PROVIDER_SITE_OTHER): Payer: Medicare Other | Admitting: Family

## 2012-03-22 ENCOUNTER — Telehealth: Payer: Self-pay | Admitting: Family

## 2012-03-22 ENCOUNTER — Encounter: Payer: Self-pay | Admitting: Family

## 2012-03-22 VITALS — BP 126/78 | HR 63 | Temp 98.3°F | Resp 16 | Ht 67.0 in | Wt 210.0 lb

## 2012-03-22 DIAGNOSIS — N318 Other neuromuscular dysfunction of bladder: Secondary | ICD-10-CM | POA: Diagnosis not present

## 2012-03-22 DIAGNOSIS — M25551 Pain in right hip: Secondary | ICD-10-CM

## 2012-03-22 DIAGNOSIS — N3281 Overactive bladder: Secondary | ICD-10-CM

## 2012-03-22 DIAGNOSIS — M25559 Pain in unspecified hip: Secondary | ICD-10-CM

## 2012-03-22 DIAGNOSIS — R7303 Prediabetes: Secondary | ICD-10-CM

## 2012-03-22 DIAGNOSIS — Z23 Encounter for immunization: Secondary | ICD-10-CM | POA: Diagnosis not present

## 2012-03-22 DIAGNOSIS — R7309 Other abnormal glucose: Secondary | ICD-10-CM

## 2012-03-22 NOTE — Assessment & Plan Note (Signed)
Stable, she is following with orthopedics and has mri scheduled.

## 2012-03-22 NOTE — Assessment & Plan Note (Signed)
Obtain a1c.  

## 2012-03-22 NOTE — Assessment & Plan Note (Signed)
Follow up calcium was normal.

## 2012-03-22 NOTE — Progress Notes (Signed)
Subjective:    Patient ID: [name], female    DOB: Feb 11, 1941, 71 y.o.   MRN: [id number]  HPI  [name] is a 71 yr old female who presents for follow up.  1)HTN-  Pt is maintained on losartan. Using support hose to knee.  Legs feel better, swelling is improved.    2) Borderline DM-urinary frequency is at baseline.   3) Hyperlipidemia- on simvastatin- tolerating without difficulty.   4) Hip pain- following with Dr. Turner Owens re: fluid in the right hip.  No pain.  She reports that she uses tramadol prn which helps a lot.     Review of Systems See HPI  Past Medical History  Diagnosis Date  . Hypertension   . Obesity   . History of breast cancer   . Allergic rhinitis   . Osteoarthritis     severe right hip osteoarthritis-s/p hip replacement  . 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 Osteopenia 11/26/2009    History   Social History  . Marital Status: Married    Spouse Name: N/A    Number of Children: N/A  . Years of Education: N/A   Occupational History  . Not on file.   Social History Main Topics  . Smoking status: Former Smoker -- 10 years    Types: Cigarettes    Quit date: 07/14/1974  . Smokeless tobacco: Never Used   Comment: quit in 1976 smoked for approx. 10 years  . Alcohol Use: Yes     drinks wine with dinner  . Drug Use: No  . Sexually Active: Not on file   Other Topics Concern  . Not on file   Social History Narrative   RetiredThe patient is married and has one child and two grandchildren that live in the area.  She drinks wine with dinner.  She quit smoking in 1976, smoked forapproximately 10 years.   Moved to [place] from [place]    Past Surgical History  Procedure Date  . Total hip arthroplasty     right hip   . Mastectomy     bilateral mastectomy  . Tonsillectomy 1949  . Eye surgery 2011    cataract removal both eyes    Family History  Problem Relation Age of Onset  . Heart failure Father     deceased age 62  .  Diabetes Father   . Alcohol abuse Father   . Breast cancer Mother     deceased at age 38 secondary to breast cancer    Allergies  Allergen Reactions  . Sulfonamide Derivatives     REACTION: Swelling of the face    Current Outpatient Prescriptions on File Prior to Visit  Medication Sig Dispense Refill  . amoxicillin (AMOXIL) 500 MG capsule 500 mg. Pre dental work 4 tabs po before dental work.      . bisoprolol (ZEBETA) 10 MG tablet TAKE 1 TABLET DAILY  90 tablet  1  . Cholecalciferol (VITAMIN D3) 2000 UNITS TABS Take 2 capsules by mouth.        . fexofenadine (ALLEGRA) 180 MG tablet Take 180 mg by mouth daily as needed.       . fluticasone (FLONASE) 50 MCG/ACT nasal spray Place 2 sprays into the nose daily.  48 g  1  . Gelatin Capsules, Empty, CAPS 2 (two) times daily.        Marland Kitchen losartan (COZAAR) 100 MG tablet Take 1 tablet (100 mg total) by mouth daily.  90 tablet  1  .  Omega-3 Fatty Acids (FISH OIL) 1000 MG CAPS Take 1,000 mg by mouth daily.       . simvastatin (ZOCOR) 10 MG tablet Take 1 tablet (10 mg total) by mouth at bedtime.  90 tablet  3  . traMADol (ULTRAM) 50 MG tablet TAKE 1 TABLET BY MOUTH EVERY 8 HOURS AS NEEDED FOR PAIN. MAXIMUM DOSE=8 TABLETS PER DAY  45 tablet  0  . DISCONTD: oxybutynin (DITROPAN) 5 MG tablet Take 1 tablet (5 mg total) by mouth 2 (two) times daily.  60 tablet  2    BP 126/78  Pulse 63  Temp 98.3 F (36.8 C) (Oral)  Resp 16  Ht 5\' 7"  (1.702 m)  Wt 210 lb (95.255 kg)  BMI 32.89 kg/m2  SpO2 99%  LMP 07/11/1991       Objective:   Physical Exam  Constitutional: She is oriented to person, place, and time. She appears well-developed and well-nourished. No distress.  Cardiovascular: Normal rate and regular rhythm.   No murmur heard. Pulmonary/Chest: Effort normal and breath sounds normal. No respiratory distress. She has no wheezes. She has no rales. She exhibits no tenderness.  Musculoskeletal:       2+ bilateral LE edema  Neurological: She is  alert and oriented to person, place, and time.  Skin: Skin is warm and dry.  Psychiatric: She has a normal mood and affect. Her behavior is normal. Judgment and thought content normal.          Assessment & Plan:

## 2012-03-22 NOTE — Patient Instructions (Addendum)
Please return to the lab fasting next week for blood work. Please schedule a follow up appointment in 3 months.

## 2012-03-22 NOTE — Assessment & Plan Note (Signed)
Stable and unchanged

## 2012-03-26 ENCOUNTER — Ambulatory Visit (HOSPITAL_COMMUNITY)
Admission: RE | Admit: 2012-03-26 | Discharge: 2012-03-26 | Disposition: A | Discharge status: Home / Self Care | Payer: Medicare Other | Source: Ambulatory Visit | Attending: Orthopedic Surgery | Admitting: Orthopedic Surgery

## 2012-03-26 ENCOUNTER — Telehealth: Payer: Self-pay | Admitting: *Deleted

## 2012-03-26 ENCOUNTER — Encounter: Payer: Self-pay | Admitting: Family

## 2012-03-26 ENCOUNTER — Other Ambulatory Visit (INDEPENDENT_AMBULATORY_CARE_PROVIDER_SITE_OTHER): Payer: Medicare Other

## 2012-03-26 DIAGNOSIS — R7303 Prediabetes: Secondary | ICD-10-CM

## 2012-03-26 DIAGNOSIS — R937 Abnormal findings on diagnostic imaging of other parts of musculoskeletal system: Secondary | ICD-10-CM | POA: Insufficient documentation

## 2012-03-26 DIAGNOSIS — R7309 Other abnormal glucose: Secondary | ICD-10-CM

## 2012-03-26 DIAGNOSIS — M25459 Effusion, unspecified hip: Secondary | ICD-10-CM | POA: Diagnosis not present

## 2012-03-26 DIAGNOSIS — M25559 Pain in unspecified hip: Secondary | ICD-10-CM | POA: Diagnosis not present

## 2012-03-26 DIAGNOSIS — E785 Hyperlipidemia, unspecified: Secondary | ICD-10-CM | POA: Diagnosis not present

## 2012-03-26 DIAGNOSIS — IMO0002 Reserved for concepts with insufficient information to code with codable children: Secondary | ICD-10-CM | POA: Diagnosis not present

## 2012-03-26 DIAGNOSIS — Z96649 Presence of unspecified artificial hip joint: Secondary | ICD-10-CM | POA: Diagnosis not present

## 2012-03-26 DIAGNOSIS — M25551 Pain in right hip: Secondary | ICD-10-CM

## 2012-03-26 LAB — HEPATIC FUNCTION PANEL
Albumin: 3.9 g/dL (ref 3.5–5.2)
Alkaline Phosphatase: 66 U/L (ref 39–117)
Bilirubin, Direct: 0.2 mg/dL (ref 0.0–0.3)
Total Protein: 7.1 g/dL (ref 6.0–8.3)

## 2012-03-26 LAB — LIPID PANEL
HDL: 66 mg/dL (ref >39.00)
Total CHOL/HDL Ratio: 2
Triglycerides: 41 mg/dL (ref 0.0–149.0)
VLDL: 8.2 mg/dL (ref 0.0–40.0)

## 2012-03-26 LAB — HEMOGLOBIN A1C: Hgb A1c MFr Bld: 5.6 % (ref 4.6–6.5)

## 2012-03-26 NOTE — Telephone Encounter (Signed)
See orders encounter for 03/26/12.

## 2012-03-26 NOTE — Telephone Encounter (Signed)
Message copied by Kathi Simpers on Tue Mar 26, 2012 10:46 AM ------      Message from: O'SULLIVAN, [name]      Created: Fri Mar 22, 2012  2:52 PM       Pt will return for A1C (borderline dm), LFT/FLP (hyperlipidemia) she will come next week

## 2012-03-26 NOTE — Telephone Encounter (Signed)
Lab orders entered

## 2012-03-27 ENCOUNTER — Ambulatory Visit: Payer: Medicare Other | Admitting: Family

## 2012-03-28 DIAGNOSIS — M25559 Pain in unspecified hip: Secondary | ICD-10-CM | POA: Diagnosis not present

## 2012-04-09 ENCOUNTER — Telehealth: Payer: Self-pay | Admitting: Family

## 2012-04-09 ENCOUNTER — Other Ambulatory Visit: Payer: Self-pay | Admitting: Family

## 2012-04-09 MED ORDER — TRAMADOL HCL 50 MG PO TABS: 50.0000 mg | ORAL_TABLET | Freq: Three times a day (TID) | ORAL | Status: DC | PRN

## 2012-04-09 NOTE — Telephone Encounter (Signed)
Rx sent 

## 2012-04-09 NOTE — Telephone Encounter (Signed)
New prescription request  tramadol

## 2012-04-11 ENCOUNTER — Encounter: Payer: Self-pay | Admitting: Family

## 2012-04-11 ENCOUNTER — Telehealth: Payer: Self-pay | Admitting: Family

## 2012-04-11 NOTE — Telephone Encounter (Signed)
Opened in error

## 2012-05-06 ENCOUNTER — Telehealth: Payer: Self-pay | Admitting: Family

## 2012-05-06 MED ORDER — FLUTICASONE PROPIONATE 50 MCG/ACT NA SUSP: 2.0000 | Freq: Every day | NASAL | Status: DC

## 2012-05-06 NOTE — Telephone Encounter (Signed)
Refill- fluticasone spr. Use two sprays nasally daily. Qty 48

## 2012-05-06 NOTE — Telephone Encounter (Signed)
Refill sent to pharmacy, #48g x 1 refill.

## 2012-06-03 ENCOUNTER — Other Ambulatory Visit: Payer: Self-pay | Admitting: Orthopedic Surgery

## 2012-06-04 ENCOUNTER — Encounter (HOSPITAL_COMMUNITY): Payer: Self-pay | Admitting: Pharmacy Technician

## 2012-06-10 ENCOUNTER — Encounter (HOSPITAL_COMMUNITY): Payer: Self-pay

## 2012-06-10 ENCOUNTER — Encounter (HOSPITAL_COMMUNITY)
Admission: RE | Admit: 2012-06-10 | Discharge: 2012-06-10 | Disposition: A | Discharge status: Home / Self Care | Payer: Medicare Other | Source: Ambulatory Visit | Attending: Anesthesiology | Admitting: Anesthesiology

## 2012-06-10 ENCOUNTER — Encounter (HOSPITAL_COMMUNITY)
Admission: RE | Admit: 2012-06-10 | Discharge: 2012-06-10 | Disposition: A | Discharge status: Home / Self Care | Payer: Medicare Other | Source: Ambulatory Visit | Attending: Orthopedic Surgery | Admitting: Orthopedic Surgery

## 2012-06-10 DIAGNOSIS — Z01818 Encounter for other preprocedural examination: Secondary | ICD-10-CM | POA: Diagnosis not present

## 2012-06-10 LAB — TYPE AND SCREEN
ABO/RH(D): A POS
Antibody Screen: NEGATIVE

## 2012-06-10 LAB — BASIC METABOLIC PANEL
CO2: 25 mEq/L (ref 19–32)
Chloride: 104 mEq/L (ref 96–112)
Creatinine, Ser: 0.81 mg/dL (ref 0.50–1.10)
GFR calc Af Amer: 84 mL/min — ABNORMAL LOW (ref >90)
Sodium: 139 mEq/L (ref 135–145)

## 2012-06-10 LAB — CBC WITH DIFFERENTIAL/PLATELET
Basophils Absolute: 0.1 10*3/uL (ref 0.0–0.1)
Eosinophils Relative: 1 % (ref 0–5)
HCT: 37.7 % (ref 36.0–46.0)
Lymphocytes Relative: 24 % (ref 12–46)
Lymphs Abs: 2.6 10*3/uL (ref 0.7–4.0)
MCV: 91.5 fL (ref 78.0–100.0)
Monocytes Absolute: 0.6 10*3/uL (ref 0.1–1.0)
Neutro Abs: 7.3 10*3/uL (ref 1.7–7.7)
Platelets: 311 10*3/uL (ref 150–400)
RBC: 4.12 MIL/uL (ref 3.87–5.11)
RDW: 13.7 % (ref 11.5–15.5)
WBC: 10.6 10*3/uL — ABNORMAL HIGH (ref 4.0–10.5)

## 2012-06-10 LAB — URINALYSIS, ROUTINE W REFLEX MICROSCOPIC
Bilirubin Urine: NEGATIVE
Hgb urine dipstick: NEGATIVE
Nitrite: NEGATIVE
Protein, ur: NEGATIVE mg/dL
Urobilinogen, UA: 0.2 mg/dL (ref 0.0–1.0)

## 2012-06-10 LAB — PROTIME-INR: Prothrombin Time: 13 seconds (ref 11.6–15.2)

## 2012-06-10 LAB — APTT: aPTT: 35 seconds (ref 24–37)

## 2012-06-10 NOTE — Pre-Procedure Instructions (Addendum)
[name]  06/10/2012   Your procedure is scheduled on:  06/17/12  Report to [facility] at 530 AM.  Call this number if you have problems the morning of surgery: [phone]   Remember:   Do not eat foodor drink:After Midnight.    Take these medicines the morning of surgery with A SIP OF WATER:bisoprolol,pain med  STOP omega 3   Do not wear jewelry, make-up or nail polish.  Do not wear lotions, powders, or perfumes. You may wear deodorant.  Do not shave 48 hours prior to surgery. Men may shave face and neck.  Do not bring valuables to the hospital.  Contacts, dentures or bridgework may not be worn into surgery.  Leave suitcase in the car. After surgery it may be brought to your room.  For patients admitted to the hospital, checkout time is 11:00 AM the day of discharge.   Patients discharged the day of surgery will not be allowed to drive home.  Name and phone number of your driver:richard spouse [phone]  Special Instructions: Incentive Spirometry - Practice and bring it with you on the day of surgery. Shower using CHG 2 nights before surgery and the night before surgery.  If you shower the day of surgery use CHG.  Use special wash - you have one bottle of CHG for all showers.  You should use approximately 1/3 of the bottle for each shower.   Please read over the following fact sheets that you were given: Pain Booklet, Coughing and Deep Breathing, Blood Transfusion Information and MRSA Information

## 2012-06-13 NOTE — H&P (Signed)
TOTAL HIP REVISION ADMISSION H&P  Patient is admitted for right revision total hip arthroplasty.  Subjective:  Chief Complaint: right hip pain  HPI: [name], 71 y.o. female, has a history of pain and functional disability in the right hip due to arthritis and patient has failed non-surgical conservative treatments for greater than 12 weeks to include NSAID's and/or analgesics, use of assistive devices and activity modification. The indications for the revision total hip arthroplasty are fluid collection on [facility] MRI scan, plasma cobalt level of 23..  Onset of symptoms was gradual starting 2 years ago with gradually worsening course since that time.  Prior procedures on the right hip include arthroplasty.  Patient currently rates pain in the right hip at 7 out of 10 with activity.  There is night pain, worsening of pain with activity and weight bearing and pain that interfers with activities of daily living. Patient has evidence of fluid collection by imaging studies.  This condition presents safety issues increasing the risk of falls. There is no current active infection.  Patient Active Problem List   Diagnosis Date Noted  . Hyperglycemia 12/28/2011  . Abdominal pain 12/06/2011  . Hypercalcemia 10/10/2011  . Overactive bladder 09/06/2011  . Hip pain, right 06/05/2011  . 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 HYPERCALCEMIA 11/29/2009  . Osteopenia 11/26/2009  . ALLERGIC RHINITIS 09/24/2008  . ADENOCARCINOMA, BREAST, HX OF 09/24/2008  . HYPERLIPIDEMIA 01/24/2008  . ANEMIA 01/24/2008  . HYPERTENSION 12/13/2007   Past Medical History  Diagnosis Date  . Hypertension   . Obesity   . History of breast cancer   . Allergic rhinitis   . Osteoarthritis     severe right hip osteoarthritis-s/p hip replacement  . 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 Osteopenia 11/26/2009  . Shortness of breath     with exersion  . Cancer     bil breast ca  . Anemia     hx    Past  Surgical History  Procedure Date  . Total hip arthroplasty     right hip   . Mastectomy     bilateral mastectomy  . Tonsillectomy 1949  . Eye surgery 2011    cataract removal both eyes    No prescriptions prior to admission   Allergies  Allergen Reactions  . Sulfonamide Derivatives Swelling    REACTION: Swelling of the face  . Chocolate Other (See Comments)    Sinus problems    History  Substance Use Topics  . Smoking status: Former Smoker -- 0.5 packs/day for 10 years    Types: Cigarettes    Quit date: 07/14/1974  . Smokeless tobacco: Never Used     Comment: quit in 1976 smoked for approx. 10 years  . Alcohol Use: Yes     Comment: drinks wine with dinner    Family History  Problem Relation Age of Onset  . Heart failure Father     deceased age 53  . Diabetes Father   . Alcohol abuse Father   . Breast cancer Mother     deceased at age 95 secondary to breast cancer      Review of Systems  Constitutional: Negative.   HENT: Negative.   Eyes: Negative.   Respiratory: Negative.   Cardiovascular: Negative.   Gastrointestinal: Negative.   Genitourinary: Negative.   Musculoskeletal: Positive for joint pain.  Skin: Negative.   Neurological: Negative.   Endo/Heme/Allergies: Negative.   Psychiatric/Behavioral: Negative.     Objective:  Physical Exam  Constitutional: She is  oriented to person, place, and time. She appears well-developed and well-nourished.  HENT:  Head: Normocephalic.  Eyes: Pupils are equal, round, and reactive to light.  Cardiovascular: Normal heart sounds.   Respiratory: Breath sounds normal.  GI: Bowel sounds are normal.  Musculoskeletal:       Right hip: She exhibits tenderness.  Neurological: She is alert and oriented to person, place, and time.  Psychiatric: She has a normal mood and affect.    Vital signs in last 24 hours:     Labs:   Estimated Body mass index is 32.89 kg/(m^2) as calculated from the following:   Height as of  03/22/12: 5\' 7" (1.702 m).   Weight as of 03/22/12: 210 lb(95.255 kg).  Imaging Review:  Plain radiographs demonstrate  degenerative joint disease of the right hip(s). There is evidence of loosening of the acetabular cup.The bone quality appears to be adequate for age and reported activity level. There is debris in the fluid collection on MRI consistent with a metal reaction.  Assessment/Plan:  End stage arthritis, right hip(s) with failed previous arthroplasty.  The patient history, physical examination, clinical judgement of the provider and imaging studies are consistent with end stage degenerative joint disease of the right hip(s), previous total hip arthroplasty. Revision total hip arthroplasty is deemed medically necessary. The treatment options including medical management, injection therapy, arthroscopy and arthroplasty were discussed at length. The risks and benefits of total hip arthroplasty were presented and reviewed. The risks due to aseptic loosening, infection, stiffness, dislocation/subluxation,  thromboembolic complications and other imponderables were discussed.  The patient acknowledged the explanation, agreed to proceed with the plan and consent was signed. Patient is being admitted for inpatient treatment for surgery, pain control, PT, OT, prophylactic antibiotics, VTE prophylaxis, progressive ambulation and ADL's and discharge planning. The patient is planning to be discharged home with home health services

## 2012-06-14 ENCOUNTER — Other Ambulatory Visit: Payer: Medicare Other | Admitting: Lab

## 2012-06-14 ENCOUNTER — Ambulatory Visit: Payer: Medicare Other | Admitting: Hematology & Oncology

## 2012-06-16 MED ORDER — CEFAZOLIN SODIUM-DEXTROSE 2-3 GM-% IV SOLR
2.0000 g | INTRAVENOUS | Status: AC
  Administered 2012-06-17: 2 g via INTRAVENOUS
  Filled 2012-06-16: qty 50

## 2012-06-17 ENCOUNTER — Inpatient Hospital Stay (HOSPITAL_COMMUNITY)
Admission: RE | Admit: 2012-06-17 | Discharge: 2012-06-20 | DRG: 468 | Disposition: A | Discharge status: Skilled Nursing Facility | Payer: Medicare Other | Source: Ambulatory Visit | Attending: Orthopedic Surgery | Admitting: Orthopedic Surgery

## 2012-06-17 ENCOUNTER — Inpatient Hospital Stay (HOSPITAL_COMMUNITY): Payer: Medicare Other

## 2012-06-17 ENCOUNTER — Encounter (HOSPITAL_COMMUNITY): Payer: Self-pay | Admitting: General Practice

## 2012-06-17 ENCOUNTER — Encounter (HOSPITAL_COMMUNITY)
Admission: RE | Disposition: A | Discharge status: Skilled Nursing Facility | Payer: Self-pay | Source: Ambulatory Visit | Attending: Orthopedic Surgery

## 2012-06-17 ENCOUNTER — Encounter (HOSPITAL_COMMUNITY): Payer: Self-pay | Admitting: Certified Registered"

## 2012-06-17 ENCOUNTER — Inpatient Hospital Stay (HOSPITAL_COMMUNITY): Payer: Medicare Other | Admitting: Certified Registered"

## 2012-06-17 DIAGNOSIS — Z01818 Encounter for other preprocedural examination: Secondary | ICD-10-CM

## 2012-06-17 DIAGNOSIS — E785 Hyperlipidemia, unspecified: Secondary | ICD-10-CM | POA: Diagnosis not present

## 2012-06-17 DIAGNOSIS — Z96649 Presence of unspecified artificial hip joint: Secondary | ICD-10-CM

## 2012-06-17 DIAGNOSIS — M25551 Pain in right hip: Secondary | ICD-10-CM

## 2012-06-17 DIAGNOSIS — Z01810 Encounter for preprocedural cardiovascular examination: Secondary | ICD-10-CM | POA: Diagnosis not present

## 2012-06-17 DIAGNOSIS — T84099A Other mechanical complication of unspecified internal joint prosthesis, initial encounter: Secondary | ICD-10-CM | POA: Diagnosis not present

## 2012-06-17 DIAGNOSIS — R279 Unspecified lack of coordination: Secondary | ICD-10-CM | POA: Diagnosis not present

## 2012-06-17 DIAGNOSIS — M899 Disorder of bone, unspecified: Secondary | ICD-10-CM | POA: Diagnosis not present

## 2012-06-17 DIAGNOSIS — Y831 Surgical operation with implant of artificial internal device as the cause of abnormal reaction of the patient, or of later complication, without mention of misadventure at the time of the procedure: Secondary | ICD-10-CM | POA: Diagnosis present

## 2012-06-17 DIAGNOSIS — T8489XA Other specified complication of internal orthopedic prosthetic devices, implants and grafts, initial encounter: Secondary | ICD-10-CM | POA: Diagnosis not present

## 2012-06-17 DIAGNOSIS — M6281 Muscle weakness (generalized): Secondary | ICD-10-CM | POA: Diagnosis not present

## 2012-06-17 DIAGNOSIS — C50919 Malignant neoplasm of unspecified site of unspecified female breast: Secondary | ICD-10-CM | POA: Diagnosis not present

## 2012-06-17 DIAGNOSIS — M25559 Pain in unspecified hip: Secondary | ICD-10-CM | POA: Diagnosis present

## 2012-06-17 DIAGNOSIS — Z01812 Encounter for preprocedural laboratory examination: Secondary | ICD-10-CM

## 2012-06-17 DIAGNOSIS — M949 Disorder of cartilage, unspecified: Secondary | ICD-10-CM | POA: Diagnosis not present

## 2012-06-17 DIAGNOSIS — E669 Obesity, unspecified: Secondary | ICD-10-CM | POA: Diagnosis present

## 2012-06-17 DIAGNOSIS — Z471 Aftercare following joint replacement surgery: Secondary | ICD-10-CM | POA: Diagnosis not present

## 2012-06-17 DIAGNOSIS — T84018A Broken internal joint prosthesis, other site, initial encounter: Secondary | ICD-10-CM

## 2012-06-17 DIAGNOSIS — I10 Essential (primary) hypertension: Secondary | ICD-10-CM | POA: Diagnosis not present

## 2012-06-17 DIAGNOSIS — Z87891 Personal history of nicotine dependence: Secondary | ICD-10-CM

## 2012-06-17 DIAGNOSIS — Z853 Personal history of malignant neoplasm of breast: Secondary | ICD-10-CM

## 2012-06-17 DIAGNOSIS — Z4801 Encounter for change or removal of surgical wound dressing: Secondary | ICD-10-CM | POA: Diagnosis not present

## 2012-06-17 DIAGNOSIS — Z5189 Encounter for other specified aftercare: Secondary | ICD-10-CM | POA: Diagnosis not present

## 2012-06-17 DIAGNOSIS — T84498A Other mechanical complication of other internal orthopedic devices, implants and grafts, initial encounter: Secondary | ICD-10-CM | POA: Diagnosis not present

## 2012-06-17 DIAGNOSIS — D649 Anemia, unspecified: Secondary | ICD-10-CM | POA: Diagnosis present

## 2012-06-17 DIAGNOSIS — R7309 Other abnormal glucose: Secondary | ICD-10-CM | POA: Diagnosis not present

## 2012-06-17 HISTORY — DX: Iron deficiency anemia, unspecified: D50.9

## 2012-06-17 HISTORY — DX: Dyspnea, unspecified: R06.00

## 2012-06-17 HISTORY — DX: Disorder of thyroid, unspecified: E07.9

## 2012-06-17 HISTORY — DX: Other forms of dyspnea: R06.09

## 2012-06-17 HISTORY — PX: TOTAL HIP REVISION: SHX763

## 2012-06-17 HISTORY — DX: Unspecified osteoarthritis, unspecified site: M19.90

## 2012-06-17 HISTORY — DX: Malignant neoplasm of unspecified site of unspecified female breast: C50.919

## 2012-06-17 LAB — GLUCOSE, CAPILLARY: Glucose-Capillary: 116 mg/dL — ABNORMAL HIGH (ref 70–99)

## 2012-06-17 SURGERY — TOTAL HIP REVISION
Anesthesia: General | Site: Hip | Laterality: Right | Wound class: Clean

## 2012-06-17 MED ORDER — BISACODYL 5 MG PO TBEC
5.0000 mg | DELAYED_RELEASE_TABLET | Freq: Every day | ORAL | Status: DC | PRN
  Administered 2012-06-18 – 2012-06-19 (×2): 5 mg via ORAL
  Filled 2012-06-17 (×2): qty 1

## 2012-06-17 MED ORDER — PROPOFOL 10 MG/ML IV BOLUS
INTRAVENOUS | Status: DC | PRN
  Administered 2012-06-17: 150 mg via INTRAVENOUS

## 2012-06-17 MED ORDER — LIDOCAINE HCL (CARDIAC) 20 MG/ML IV SOLN
INTRAVENOUS | Status: DC | PRN
  Administered 2012-06-17: 50 mg via INTRAVENOUS

## 2012-06-17 MED ORDER — BISOPROLOL FUMARATE 10 MG PO TABS
10.0000 mg | ORAL_TABLET | Freq: Every day | ORAL | Status: DC
  Administered 2012-06-18 – 2012-06-20 (×3): 10 mg via ORAL
  Filled 2012-06-17 (×4): qty 1

## 2012-06-17 MED ORDER — VITAMIN D3 25 MCG (1000 UNIT) PO TABS
4000.0000 [IU] | ORAL_TABLET | Freq: Every day | ORAL | Status: DC
  Administered 2012-06-18 – 2012-06-20 (×3): 4000 [IU] via ORAL
  Filled 2012-06-17 (×4): qty 4

## 2012-06-17 MED ORDER — ACETAMINOPHEN 10 MG/ML IV SOLN
INTRAVENOUS | Status: DC | PRN
  Administered 2012-06-17: 1000 mg via INTRAVENOUS

## 2012-06-17 MED ORDER — CHLORHEXIDINE GLUCONATE 4 % EX LIQD: 60.0000 mL | Freq: Once | CUTANEOUS | Status: DC

## 2012-06-17 MED ORDER — ACETAMINOPHEN 10 MG/ML IV SOLN
1000.0000 mg | Freq: Four times a day (QID) | INTRAVENOUS | Status: AC
  Administered 2012-06-17 (×3): 1000 mg via INTRAVENOUS
  Filled 2012-06-17 (×4): qty 100

## 2012-06-17 MED ORDER — ARTIFICIAL TEARS OP OINT
TOPICAL_OINTMENT | OPHTHALMIC | Status: DC | PRN
  Administered 2012-06-17: 1 via OPHTHALMIC

## 2012-06-17 MED ORDER — MAGNESIUM HYDROXIDE 400 MG/5ML PO SUSP
30.0000 mL | Freq: Every day | ORAL | Status: DC | PRN
  Administered 2012-06-19: 30 mL via ORAL
  Filled 2012-06-17: qty 30

## 2012-06-17 MED ORDER — HYDROMORPHONE HCL PF 1 MG/ML IJ SOLN
0.2500 mg | INTRAMUSCULAR | Status: DC | PRN
  Administered 2012-06-17 (×2): 0.5 mg via INTRAVENOUS

## 2012-06-17 MED ORDER — PHENOL 1.4 % MT LIQD: 1.0000 | OROMUCOSAL | Status: DC | PRN

## 2012-06-17 MED ORDER — ONDANSETRON HCL 4 MG/2ML IJ SOLN: 4.0000 mg | Freq: Four times a day (QID) | INTRAMUSCULAR | Status: DC | PRN

## 2012-06-17 MED ORDER — GELATIN 600 MG PO CAPS: 600.0000 mg | ORAL_CAPSULE | Freq: Two times a day (BID) | ORAL | Status: DC

## 2012-06-17 MED ORDER — BUPIVACAINE-EPINEPHRINE 0.5% -1:200000 IJ SOLN
INTRAMUSCULAR | Status: DC | PRN
  Administered 2012-06-17: 20 mL

## 2012-06-17 MED ORDER — OXYCODONE HCL 5 MG PO TABS
5.0000 mg | ORAL_TABLET | ORAL | Status: DC | PRN
  Administered 2012-06-17 – 2012-06-18 (×4): 10 mg via ORAL
  Administered 2012-06-18: 5 mg via ORAL
  Administered 2012-06-18 – 2012-06-19 (×2): 10 mg via ORAL
  Administered 2012-06-20: 5 mg via ORAL
  Filled 2012-06-17 (×3): qty 2
  Filled 2012-06-17: qty 1
  Filled 2012-06-17 (×3): qty 2
  Filled 2012-06-17: qty 1
  Filled 2012-06-17 (×2): qty 2
  Filled 2012-06-17: qty 1

## 2012-06-17 MED ORDER — ZOLPIDEM TARTRATE 5 MG PO TABS: 5.0000 mg | ORAL_TABLET | Freq: Every evening | ORAL | Status: DC | PRN

## 2012-06-17 MED ORDER — FENTANYL CITRATE 0.05 MG/ML IJ SOLN
INTRAMUSCULAR | Status: DC | PRN
  Administered 2012-06-17: 25 ug via INTRAVENOUS
  Administered 2012-06-17: 100 ug via INTRAVENOUS

## 2012-06-17 MED ORDER — ACETAMINOPHEN 10 MG/ML IV SOLN
INTRAVENOUS | Status: AC
  Filled 2012-06-17: qty 100

## 2012-06-17 MED ORDER — MIDAZOLAM HCL 5 MG/5ML IJ SOLN
INTRAMUSCULAR | Status: DC | PRN
  Administered 2012-06-17: 2 mg via INTRAVENOUS

## 2012-06-17 MED ORDER — MENTHOL 3 MG MT LOZG: 1.0000 | LOZENGE | OROMUCOSAL | Status: DC | PRN

## 2012-06-17 MED ORDER — METOCLOPRAMIDE HCL 10 MG PO TABS: 5.0000 mg | ORAL_TABLET | Freq: Three times a day (TID) | ORAL | Status: DC | PRN

## 2012-06-17 MED ORDER — SIMVASTATIN 10 MG PO TABS
10.0000 mg | ORAL_TABLET | Freq: Every day | ORAL | Status: DC
  Administered 2012-06-17 – 2012-06-19 (×3): 10 mg via ORAL
  Filled 2012-06-17 (×4): qty 1

## 2012-06-17 MED ORDER — ACETAMINOPHEN 650 MG RE SUPP: 650.0000 mg | Freq: Four times a day (QID) | RECTAL | Status: DC | PRN

## 2012-06-17 MED ORDER — METHOCARBAMOL 100 MG/ML IJ SOLN
500.0000 mg | Freq: Four times a day (QID) | INTRAVENOUS | Status: DC | PRN
  Administered 2012-06-17: 500 mg via INTRAVENOUS
  Filled 2012-06-17: qty 5

## 2012-06-17 MED ORDER — LORATADINE 10 MG PO TABS
10.0000 mg | ORAL_TABLET | Freq: Every day | ORAL | Status: DC
  Administered 2012-06-18 – 2012-06-20 (×3): 10 mg via ORAL
  Filled 2012-06-17 (×4): qty 1

## 2012-06-17 MED ORDER — ACETAMINOPHEN 325 MG PO TABS
650.0000 mg | ORAL_TABLET | Freq: Four times a day (QID) | ORAL | Status: DC | PRN
  Administered 2012-06-18: 650 mg via ORAL
  Filled 2012-06-17: qty 2

## 2012-06-17 MED ORDER — METHOCARBAMOL 500 MG PO TABS
500.0000 mg | ORAL_TABLET | Freq: Four times a day (QID) | ORAL | Status: DC | PRN
  Administered 2012-06-17 – 2012-06-20 (×5): 500 mg via ORAL
  Filled 2012-06-17 (×6): qty 1

## 2012-06-17 MED ORDER — ONDANSETRON HCL 4 MG PO TABS: 4.0000 mg | ORAL_TABLET | Freq: Four times a day (QID) | ORAL | Status: DC | PRN

## 2012-06-17 MED ORDER — LOSARTAN POTASSIUM 50 MG PO TABS
100.0000 mg | ORAL_TABLET | Freq: Every day | ORAL | Status: DC
  Administered 2012-06-18 – 2012-06-20 (×3): 100 mg via ORAL
  Filled 2012-06-17 (×4): qty 2

## 2012-06-17 MED ORDER — VECURONIUM BROMIDE 10 MG IV SOLR
INTRAVENOUS | Status: DC | PRN
  Administered 2012-06-17: 6 mg via INTRAVENOUS

## 2012-06-17 MED ORDER — PNEUMOCOCCAL VAC POLYVALENT 25 MCG/0.5ML IJ INJ
0.5000 mL | INJECTION | INTRAMUSCULAR | Status: AC
  Administered 2012-06-18: 0.5 mL via INTRAMUSCULAR
  Filled 2012-06-17: qty 0.5

## 2012-06-17 MED ORDER — ASPIRIN EC 325 MG PO TBEC
325.0000 mg | DELAYED_RELEASE_TABLET | Freq: Two times a day (BID) | ORAL | Status: DC
  Administered 2012-06-17 – 2012-06-20 (×7): 325 mg via ORAL
  Filled 2012-06-17 (×8): qty 1

## 2012-06-17 MED ORDER — HYDROMORPHONE HCL PF 1 MG/ML IJ SOLN
INTRAMUSCULAR | Status: AC
Start: 2012-06-17 — End: 2012-06-17
  Filled 2012-06-17: qty 1

## 2012-06-17 MED ORDER — METOCLOPRAMIDE HCL 5 MG/ML IJ SOLN: 5.0000 mg | Freq: Three times a day (TID) | INTRAMUSCULAR | Status: DC | PRN

## 2012-06-17 MED ORDER — OXYCODONE HCL 5 MG/5ML PO SOLN: 5.0000 mg | Freq: Once | ORAL | Status: DC | PRN

## 2012-06-17 MED ORDER — ONDANSETRON HCL 4 MG/2ML IJ SOLN
INTRAMUSCULAR | Status: DC | PRN
  Administered 2012-06-17: 4 mg via INTRAVENOUS

## 2012-06-17 MED ORDER — DEXTROSE-NACL 5-0.45 % IV SOLN
INTRAVENOUS | Status: DC
Start: 2012-06-17 — End: 2012-06-17

## 2012-06-17 MED ORDER — LACTATED RINGERS IV SOLN
INTRAVENOUS | Status: DC | PRN
  Administered 2012-06-17 (×2): via INTRAVENOUS

## 2012-06-17 MED ORDER — EPHEDRINE SULFATE 50 MG/ML IJ SOLN
INTRAMUSCULAR | Status: DC | PRN
  Administered 2012-06-17: 10 mg via INTRAVENOUS
  Administered 2012-06-17: 15 mg via INTRAVENOUS

## 2012-06-17 MED ORDER — BUPIVACAINE-EPINEPHRINE PF 0.25-1:200000 % IJ SOLN
INTRAMUSCULAR | Status: AC
  Filled 2012-06-17: qty 30

## 2012-06-17 MED ORDER — FLUTICASONE PROPIONATE 50 MCG/ACT NA SUSP
2.0000 | Freq: Every day | NASAL | Status: DC
  Administered 2012-06-18 – 2012-06-20 (×3): 2 via NASAL
  Filled 2012-06-17: qty 16

## 2012-06-17 MED ORDER — HYDROMORPHONE HCL PF 1 MG/ML IJ SOLN
1.0000 mg | INTRAMUSCULAR | Status: DC | PRN
  Administered 2012-06-17 (×2): 1 mg via INTRAVENOUS
  Filled 2012-06-17 (×2): qty 1

## 2012-06-17 MED ORDER — FLEET ENEMA 7-19 GM/118ML RE ENEM: 1.0000 | ENEMA | Freq: Once | RECTAL | Status: AC | PRN

## 2012-06-17 MED ORDER — NEOSTIGMINE METHYLSULFATE 1 MG/ML IJ SOLN
INTRAMUSCULAR | Status: DC | PRN
  Administered 2012-06-17: 4 mg via INTRAVENOUS

## 2012-06-17 MED ORDER — ALUM & MAG HYDROXIDE-SIMETH 200-200-20 MG/5ML PO SUSP: 30.0000 mL | ORAL | Status: DC | PRN

## 2012-06-17 MED ORDER — OXYCODONE HCL 5 MG PO TABS: 5.0000 mg | ORAL_TABLET | Freq: Once | ORAL | Status: DC | PRN

## 2012-06-17 MED ORDER — SODIUM CHLORIDE 0.9 % IR SOLN
Status: DC | PRN
  Administered 2012-06-17: 1000 mL

## 2012-06-17 MED ORDER — KCL IN DEXTROSE-NACL 20-5-0.45 MEQ/L-%-% IV SOLN
INTRAVENOUS | Status: DC
  Administered 2012-06-17: 125 mL/h via INTRAVENOUS
  Administered 2012-06-17 – 2012-06-18 (×2): 125 mL via INTRAVENOUS
  Filled 2012-06-17 (×8): qty 1000

## 2012-06-17 MED ORDER — GLYCOPYRROLATE 0.2 MG/ML IJ SOLN
INTRAMUSCULAR | Status: DC | PRN
  Administered 2012-06-17: .8 mg via INTRAVENOUS

## 2012-06-17 MED ORDER — DIPHENHYDRAMINE HCL 12.5 MG/5ML PO ELIX: 12.5000 mg | ORAL_SOLUTION | ORAL | Status: DC | PRN

## 2012-06-17 SURGICAL SUPPLY — 69 items
ACETABULAR CUP ×1 IMPLANT
BOWL SMART MIX CTS (DISPOSABLE) IMPLANT
BRUSH FEMORAL CANAL (MISCELLANEOUS) IMPLANT
CLOTH BEACON ORANGE TIMEOUT ST (SAFETY) ×2 IMPLANT
COVER SURGICAL LIGHT HANDLE (MISCELLANEOUS) ×2 IMPLANT
DRAPE C-ARM 42X72 X-RAY (DRAPES) IMPLANT
DRAPE ORTHO SPLIT 77X108 STRL (DRAPES) ×2
DRAPE PROXIMA HALF (DRAPES) ×3 IMPLANT
DRAPE SURG ORHT 6 SPLT 77X108 (DRAPES) ×1 IMPLANT
DRAPE U-SHAPE 47X51 STRL (DRAPES) ×2 IMPLANT
DRILL BIT 7/64X5 (BIT) ×2 IMPLANT
DRSG MEPILEX BORDER 4X12 (GAUZE/BANDAGES/DRESSINGS) ×1 IMPLANT
DRSG MEPILEX BORDER 4X8 (GAUZE/BANDAGES/DRESSINGS) ×2 IMPLANT
DURAPREP 26ML APPLICATOR (WOUND CARE) ×3 IMPLANT
ELECT BLADE 4.0 EZ CLEAN MEGAD (MISCELLANEOUS)
ELECT BLADE 6.5 EXT (BLADE) IMPLANT
ELECT REM PT RETURN 9FT ADLT (ELECTROSURGICAL) ×2
ELECTRODE BLDE 4.0 EZ CLN MEGD (MISCELLANEOUS) IMPLANT
ELECTRODE REM PT RTRN 9FT ADLT (ELECTROSURGICAL) ×1 IMPLANT
ELIMINATOR HOLE APEX DEPUY (Hips) ×1 IMPLANT
EVACUATOR 1/8 PVC DRAIN (DRAIN) IMPLANT
GAUZE XEROFORM 5X9 LF (GAUZE/BANDAGES/DRESSINGS) ×2 IMPLANT
GLOVE BIO SURGEON STRL SZ7 (GLOVE) ×2 IMPLANT
GLOVE BIO SURGEON STRL SZ7.5 (GLOVE) ×2 IMPLANT
GLOVE BIOGEL PI IND STRL 7.0 (GLOVE) ×1 IMPLANT
GLOVE BIOGEL PI IND STRL 8 (GLOVE) ×1 IMPLANT
GLOVE BIOGEL PI INDICATOR 7.0 (GLOVE) ×1
GLOVE BIOGEL PI INDICATOR 8 (GLOVE) ×1
GLOVE ECLIPSE 7.0 STRL STRAW (GLOVE) ×1 IMPLANT
GOWN PREVENTION PLUS XLARGE (GOWN DISPOSABLE) ×6 IMPLANT
GOWN STRL NON-REIN LRG LVL3 (GOWN DISPOSABLE) ×4 IMPLANT
HANDPIECE INTERPULSE COAX TIP (DISPOSABLE)
HEAD CERAMIC 36 PLUS5 (Hips) ×1 IMPLANT
HEEL PROTECTOR  874200 (MISCELLANEOUS)
HEEL PROTECTOR 874200 (MISCELLANEOUS) IMPLANT
HOOD PEEL AWAY FACE SHEILD DIS (HOOD) ×4 IMPLANT
KIT BASIN OR (CUSTOM PROCEDURE TRAY) ×2 IMPLANT
KIT ROOM TURNOVER OR (KITS) ×2 IMPLANT
LINER MARA 4 10D 36X64 PL4 IMPLANT
LINER MARA 4MM 10D 36X64MM PL4 ×1 IMPLANT
MANIFOLD NEPTUNE II (INSTRUMENTS) ×2 IMPLANT
NEEDLE 22X1 1/2 (OR ONLY) (NEEDLE) ×2 IMPLANT
NOZZLE PRISM 8.5MM (MISCELLANEOUS) IMPLANT
NS IRRIG 1000ML POUR BTL (IV SOLUTION) ×3 IMPLANT
PACK TOTAL JOINT (CUSTOM PROCEDURE TRAY) ×2 IMPLANT
PAD ARMBOARD 7.5X6 YLW CONV (MISCELLANEOUS) ×4 IMPLANT
PASSER SUT SWANSON 36MM LOOP (INSTRUMENTS) IMPLANT
PRESSURIZER FEMORAL UNIV (MISCELLANEOUS) IMPLANT
SCREW PINN CAN BONE 6.5X50MM (Screw) ×1 IMPLANT
SCREW PINNACLE CAN 6.5X40MM (Screw) ×1 IMPLANT
SET HNDPC FAN SPRY TIP SCT (DISPOSABLE) IMPLANT
SLEEVE SURGEON STRL (DRAPES) IMPLANT
SPONGE GAUZE 4X4 12PLY (GAUZE/BANDAGES/DRESSINGS) ×2 IMPLANT
SPONGE LAP 18X18 X RAY DECT (DISPOSABLE) IMPLANT
STAPLER VISISTAT 35W (STAPLE) ×2 IMPLANT
SUT ETHIBOND 2 V 37 (SUTURE) ×2 IMPLANT
SUT ETHILON 3 0 FSL (SUTURE) ×2 IMPLANT
SUT VIC AB 0 CTB1 27 (SUTURE) ×2 IMPLANT
SUT VIC AB 1 CTX 36 (SUTURE) ×2
SUT VIC AB 1 CTX36XBRD ANBCTR (SUTURE) ×1 IMPLANT
SUT VIC AB 2-0 CTB1 (SUTURE) ×2 IMPLANT
SYR 20ML ECCENTRIC (SYRINGE) ×2 IMPLANT
SYR CONTROL 10ML LL (SYRINGE) ×2 IMPLANT
TOWEL OR 17X24 6PK STRL BLUE (TOWEL DISPOSABLE) ×2 IMPLANT
TOWEL OR 17X26 10 PK STRL BLUE (TOWEL DISPOSABLE) ×2 IMPLANT
TOWER CARTRIDGE SMART MIX (DISPOSABLE) IMPLANT
TRAY FOLEY CATH 14FR (SET/KITS/TRAYS/PACK) IMPLANT
TUBE ANAEROBIC SPECIMEN COL (MISCELLANEOUS) ×2 IMPLANT
WATER STERILE IRR 1000ML POUR (IV SOLUTION) ×4 IMPLANT

## 2012-06-17 NOTE — Anesthesia Preprocedure Evaluation (Addendum)
Anesthesia Evaluation  Patient identified by MRN, date of birth, ID band Patient awake    Reviewed: Allergy & Precautions, H&P , NPO status , Patient's Chart, lab work & pertinent test results  Airway Mallampati: II      Dental  (+) Dental Advidsory Given   Pulmonary shortness of breath and with exertion, former smoker,          Cardiovascular hypertension, On Medications and On Home Beta Blockers     Neuro/Psych    GI/Hepatic   Endo/Other  obese  Renal/GU      Musculoskeletal  (+) Arthritis -,   Abdominal   Peds  Hematology  (+) anemia ,   Anesthesia Other Findings   Reproductive/Obstetrics                          Anesthesia Physical Anesthesia Plan  ASA: II  Anesthesia Plan: General   Post-op Pain Management:    Induction: Intravenous  Airway Management Planned: Oral ETT  Additional Equipment:   Intra-op Plan:   Post-operative Plan: Extubation in OR  Informed Consent: I have reviewed the patients History and Physical, chart, labs and discussed the procedure including the risks, benefits and alternatives for the proposed anesthesia with the patient or authorized representative who has indicated his/her understanding and acceptance.   Dental Advisory Given  Plan Discussed with: CRNA, Anesthesiologist and Surgeon  Anesthesia Plan Comments:        Anesthesia Quick Evaluation

## 2012-06-17 NOTE — Anesthesia Procedure Notes (Signed)
Procedure Name: Intubation Date/Time: 06/17/2012 7:36 AM Performed by: Carmela Rima Pre-anesthesia Checklist: Patient identified, Timeout performed, Emergency Drugs available, Suction available and Patient being monitored Patient Re-evaluated:Patient Re-evaluated prior to inductionOxygen Delivery Method: Circle system utilized Preoxygenation: Pre-oxygenation with 100% oxygen Intubation Type: IV induction Ventilation: Mask ventilation without difficulty LMA: LMA inserted Laryngoscope Size: Mac and 3 Grade View: Grade I Tube type: Oral Tube size: 7.5 mm Number of attempts: 1 Placement Confirmation: ETT inserted through vocal cords under direct vision,  breath sounds checked- equal and bilateral and positive ETCO2 Secured at: 22 cm Tube secured with: Tape Dental Injury: Teeth and Oropharynx as per pre-operative assessment

## 2012-06-17 NOTE — Evaluation (Signed)
Physical Therapy Evaluation Patient Details Name: [name] MRN: [id number] DOB: [birth year] Today's Date: 06/17/2012 Time: 4098-1191 PT Time Calculation (min): 49 min  PT Assessment / Plan / Recommendation Clinical Impression  pt presents with R THRevision.  pt with posterior precautions AND no active Abduction.  pt ntoes plan is to D/C to SNF prior to return home with husband.  pt very motivated and follows all precautions well.      PT Assessment  Patient needs continued PT services    Follow Up Recommendations  SNF    Does the patient have the potential to tolerate intense rehabilitation      Barriers to Discharge None      Equipment Recommendations  Rolling walker with 5" wheels (3-in-1)    Recommendations for Other Services OT consult   Frequency Min 6X/week    Precautions / Restrictions Precautions Precautions: Posterior Hip;Fall Precaution Booklet Issued: Yes (comment) Precaution Comments: *No Active Abduction. Restrictions Weight Bearing Restrictions: Yes RLE Weight Bearing: Weight bearing as tolerated   Pertinent Vitals/Pain Pt indicates soreness, but pre medicated prior to PT.        Mobility  Bed Mobility Bed Mobility: Supine to Sit;Sitting - Scoot to Edge of Bed Supine to Sit: 3: Mod assist;With rails;HOB elevated Sitting - Scoot to Edge of Bed: 4: Min assist Details for Bed Mobility Assistance: cues for safe technique, hip precautions.  pt got OOB towards L side to avoid active abduction.   Transfers Transfers: Sit to Stand;Stand to Sit Sit to Stand: 4: Min assist;With upper extremity assist;From bed Stand to Sit: 4: Min assist;With upper extremity assist;To chair/3-in-1;With armrests Details for Transfer Assistance: cues for use of UEs, positioning LEs and controlling descent to chair.   Ambulation/Gait Ambulation/Gait Assistance: 4: Min guard Ambulation Distance (Feet): 150 Feet Assistive device: Rolling walker Ambulation/Gait Assistance  Details: cues for gait sequencing, upright posture, use of RW.   Gait Pattern: Step-through pattern;Decreased step length - left;Decreased stance time - right;Trunk flexed Stairs: No Wheelchair Mobility Wheelchair Mobility: No    Shoulder Instructions     Exercises Total Joint Exercises Ankle Circles/Pumps: AROM;Both;10 reps Quad Sets: AROM;Both;10 reps Gluteal Sets: AROM;Both;10 reps   PT Diagnosis: Difficulty walking;Acute pain  PT Problem List: Decreased strength;Decreased activity tolerance;Decreased balance;Decreased mobility;Decreased knowledge of use of DME;Decreased knowledge of precautions;Pain PT Treatment Interventions: DME instruction;Gait training;Stair training;Functional mobility training;Therapeutic activities;Therapeutic exercise;Balance training;Patient/family education   PT Goals Acute Rehab PT Goals PT Goal Formulation: With patient Time For Goal Achievement: 06/24/12 Potential to Achieve Goals: Good Pt will go Supine/Side to Sit: with modified independence PT Goal: Supine/Side to Sit - Progress: Goal set today Pt will go Sit to Supine/Side: with modified independence PT Goal: Sit to Supine/Side - Progress: Goal set today Pt will go Sit to Stand: with modified independence PT Goal: Sit to Stand - Progress: Goal set today Pt will go Stand to Sit: with modified independence PT Goal: Stand to Sit - Progress: Goal set today Pt will Ambulate: >150 feet;with modified independence;with rolling walker PT Goal: Ambulate - Progress: Goal set today Pt will Go Up / Down Stairs: 3-5 stairs;with min assist;with least restrictive assistive device PT Goal: Up/Down Stairs - Progress: Goal set today  Visit Information  Last PT Received On: 06/17/12 Assistance Needed: +1    Subjective Data  Subjective: I want to do really good with this hip.   Patient Stated Goal: Walk.     Prior Functioning  Home Living Lives With: Spouse Available Help at Discharge:  Family;Available  PRN/intermittently Type of Home: House Home Access: Stairs to enter [employer] of Steps: 2 Entrance Stairs-Rails: Right Home Layout: One level Additional Comments: pt plans on D/C to SNF for rehab prior to home with husband.   Prior Function Level of Independence: Needs assistance Needs Assistance: Light Housekeeping Light Housekeeping: Moderate Able to Take Stairs?: Yes Vocation: Retired Musician: No difficulties    Cognition  Overall Cognitive Status: Appears within functional limits for tasks assessed/performed Arousal/Alertness: Awake/alert Orientation Level: Appears intact for tasks assessed Behavior During Session: [facility] for tasks performed    Extremity/Trunk Assessment Right Lower Extremity Assessment RLE ROM/Strength/Tone: Deficits RLE ROM/Strength/Tone Deficits: AROM limited by pain and weakness post-op.   RLE Sensation: WFL - Light Touch Left Lower Extremity Assessment LLE ROM/Strength/Tone: WFL for tasks assessed LLE Sensation: WFL - Light Touch Trunk Assessment Trunk Assessment: Normal   Balance Balance Balance Assessed: No  End of Session PT - End of Session Equipment Utilized During Treatment: Gait belt Activity Tolerance: Patient tolerated treatment well Patient left: in chair;with call bell/phone within reach;with family/visitor present Nurse Communication: Mobility status;Precautions  GP     Sunny Schlein, [place] [phone] 06/17/2012, 2:43 PM

## 2012-06-17 NOTE — Preoperative (Signed)
Beta Blockers   Reason not to administer Beta Blockers:Not Applicable 

## 2012-06-17 NOTE — Progress Notes (Signed)
PHARMACIST - PHYSICIAN ORDER COMMUNICATION  CONCERNING: P&T Medication Policy on Herbal Medications  DESCRIPTION:  This patient's order for:  Gelatin 600mg   has been noted.  This product(s) is classified as an "herbal" or natural product. Due to a lack of definitive safety studies or FDA approval, nonstandard manufacturing practices, plus the potential risk of unknown drug-drug interactions while on inpatient medications, the Pharmacy and Therapeutics Committee does not permit the use of "herbal" or natural products of this type within [facility].   ACTION TAKEN: The pharmacy department is unable to verify this order at this time and your patient has been informed of this safety policy. Please reevaluate patient's clinical condition at discharge and address if the herbal or natural product(s) should be resumed at that time.

## 2012-06-17 NOTE — Transfer of Care (Signed)
Immediate Anesthesia Transfer of Care Note  Patient: [name]  Procedure(s) Performed: Procedure(s) (LRB) with comments: TOTAL HIP REVISION (Right)  Patient Location: PACU  Anesthesia Type:General  Level of Consciousness: awake, alert  and oriented  Airway & Oxygen Therapy: Patient Spontanous Breathing and Patient connected to nasal cannula oxygen  Post-op Assessment: Report given to PACU RN, Post -op Vital signs reviewed and stable and Patient moving all extremities X 4  Post vital signs: Reviewed and stable  Complications: No apparent anesthesia complications

## 2012-06-17 NOTE — Anesthesia Postprocedure Evaluation (Signed)
  Anesthesia Post-op Note  Patient: [name]  Procedure(s) Performed: Procedure(s) (LRB) with comments: TOTAL HIP REVISION (Right)  Patient Location: PACU  Anesthesia Type:General  Level of Consciousness: awake, alert  and oriented  Airway and Oxygen Therapy: Patient Spontanous Breathing and Patient connected to nasal cannula oxygen  Post-op Pain: mild  Post-op Assessment: Post-op Vital signs reviewed, Patient's Cardiovascular Status Stable, Respiratory Function Stable and Pain level controlled  Post-op Vital Signs: stable  Complications: No apparent anesthesia complications

## 2012-06-17 NOTE — Interval H&P Note (Signed)
History and Physical Interval Note:  06/17/2012 7:16 AM  [name]  has presented today for surgery, with the diagnosis of PAINFUL RIGHT ASR HIP  The various methods of treatment have been discussed with the patient and family. After consideration of risks, benefits and other options for treatment, the patient has consented to  Procedure(s) (LRB) with comments: TOTAL HIP REVISION (Right) as a surgical intervention .  The patient's history has been reviewed, patient examined, no change in status, stable for surgery.  I have reviewed the patient's chart and labs.  Questions were answered to the patient's satisfaction.     [name]

## 2012-06-17 NOTE — Op Note (Signed)
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  Surgeon: Feliberto Gottron. Turner Daniels M.D.  Assistant: Shirl Harris PA-C  Estimated blood loss: 400 cc  Fluid replacement: 1800 cc of crystalloid  Complications: None  Indications: Patient with an ASR on S-ROM total hip that did very well until a 6months ago when he had increasing groin pain. The pain wakes her up at night and recently got to the point where he could no longer go walking on a regular basis. MRI scan showed fluid collection, but no bony destruction, cobalt level 32. Plain x-rays show no change in the position of the components and the stem appears to be well ingrown. There is a small halo round acetabular component indicative of poor and growth Risks and benefits of revision surgery have been discussed and questions answered.  Procedure: Patient was identified by arm band receive preoperative IV antibiotics in the holding area at, and hospital. She was then taken to the operating room where the appropriate anesthetic monitors were attached and general endotracheal anesthesia induced with the patient in the supine position. He was then rolled into the left lateral decubitus position and fixed there with a mark 2 pelvic clamp. A Foley catheter was inserted and the limb prepped and draped in usual sterile fashion from the ankle to the hemipelvis. Time out procedure performed. Skin along the lateral hip and thigh infiltrated with 20 cc of 1/2% Marcaine and epinephrine solution. We began the operation by recreating the old posterior lateral incision 20 cm in line through the skin and subcutaneous tissue down to the level of the  IT band which was cut in line with the skin incision. This exposed the sac of fluid, and the pseudotumor beginning at the anterior aspect of the greater trochanter and are around posteriorly. The sac was excised, revealing that the posterior one half of the abductor insertion have been damaged by the pseudotumor this was later repaired. We then remove scar tissue from around to the ASR cup and femoral stem trunnion, dislocated a total hip and removed the ASR head with a mallet and metal cylinder. The trunnion was then tucked anterior and superior to the acetabulum and we continued to remove scar tissue from around the acetabular component. A posterior inferior wing retractor was hammered into place. And we began loosening the cup by placing a 1/4 inch osteotome between the edge of the acetabular component and the bone. We then used the short Innomed curved osteotome around the edge of the cup, and followed up with a longer curved 60 mm osteotome. and at that point it came out with mild to moderate ingrowth of bone into the cup. Fibrous tissue is then stripped from the acetabulum, revealing a mild to moderate damage to the anterior and superior edge of the acetabulum.. We reamed up to a 63 mm basket reamer obtaining good coverage in all quadrants irrigated with normal saline solution. We then hammered into place a 64 mm Gryption cup in 45 of abduction and 20 of anteversion. Because of the mild to moderate damage to the edge of the acetabulum to superior dome screws were placed. A central occluder was screwed into place  followed by a 10 polyethylene liner index posterior and superior. A trial reduction was then performed with a +0 and a +5 36 mm femoral head. instability was noted to 90 of flexion 70 of internal rotation with the +5. In full extension the hip could not be dislocated with external rotation. At this point a real + 5 36 mm ceramic head was hammered into place, the hip reduced and irrigated with  normal saline solution. The capsular flap, and posterior abductor tendon was repaired back to the intertrochanteric crest through drill holes with #2 Ethibond suture. We then closed the IT band with running #1 Vicryl suture, using large bites tight knot the IT band. The subcutaneous tissue with 0 and 2-0 undyed Vicryl suture, the skin was closed with running interlocking 3-0 nylon suture. A dressing of Mepilex was then applied, the patient was unclamped a rolled supine awakened extubated and taken to the recovery without difficulty.

## 2012-06-17 NOTE — Progress Notes (Signed)
UR COMPLETED  

## 2012-06-17 NOTE — Plan of Care (Signed)
Problem: Consults Goal: Diagnosis- Total Joint Replacement Revision Total Hip Right     

## 2012-06-18 ENCOUNTER — Encounter (HOSPITAL_COMMUNITY): Payer: Self-pay | Admitting: Orthopedic Surgery

## 2012-06-18 LAB — BASIC METABOLIC PANEL
Chloride: 103 mEq/L (ref 96–112)
Creatinine, Ser: 0.78 mg/dL (ref 0.50–1.10)
GFR calc Af Amer: >90 mL/min (ref >90)
Sodium: 137 mEq/L (ref 135–145)

## 2012-06-18 LAB — CBC
MCV: 92.7 fL (ref 78.0–100.0)
Platelets: 214 10*3/uL (ref 150–400)
RDW: 13.9 % (ref 11.5–15.5)
WBC: 8.9 10*3/uL (ref 4.0–10.5)

## 2012-06-18 NOTE — Progress Notes (Signed)
Physical Therapy Treatment Patient Details Name: [name] MRN: [id number] DOB: 07/13/1940 Today's Date: 06/18/2012 Time: 1109-1140 PT Time Calculation (min): 31 min  PT Assessment / Plan / Recommendation Comments on Treatment Session  pt rpesents with R THRevision.  pt making good progress and still would benefit from ST-SNF at D/C secondary to pt's husband being unable to provide much A for pt at D/C.      Follow Up Recommendations  SNF     Does the patient have the potential to tolerate intense rehabilitation     Barriers to Discharge        Equipment Recommendations  Rolling walker with 5" wheels (3-in-1)    Recommendations for Other Services    Frequency Min 6X/week   Plan Discharge plan remains appropriate;Frequency remains appropriate    Precautions / Restrictions Precautions Precautions: Posterior Hip;Fall Precaution Booklet Issued: Yes (comment) Precaution Comments: *No Active Abduction. Restrictions Weight Bearing Restrictions: Yes RLE Weight Bearing: Weight bearing as tolerated   Pertinent Vitals/Pain Indicates stiffness and a little more sore today than yesterday.      Mobility  Bed Mobility Bed Mobility: Not assessed Supine to Sit: 3: Mod assist Sit to Supine: 3: Mod assist Details for Bed Mobility Assistance: cues for hand placement and hip precautions. pt attempts to cross bil LE to (A) operated Rt LE. Transfers Transfers: Sit to Stand;Stand to Sit Sit to Stand: 4: Min guard;With upper extremity assist;From chair/3-in-1;With armrests Stand to Sit: 4: Min guard;With upper extremity assist;To chair/3-in-1;With armrests Details for Transfer Assistance: good carry over of hand placement this session Ambulation/Gait Ambulation/Gait Assistance: 4: Min guard Ambulation Distance (Feet): 250 Feet Assistive device: Rolling walker Ambulation/Gait Assistance Details: cues for gait sequencing, upright posture, increased heel strike.   Gait Pattern:  Step-through pattern;Decreased step length - left;Decreased stance time - right;Trunk flexed Stairs: No Wheelchair Mobility Wheelchair Mobility: No    Exercises Total Joint Exercises Ankle Circles/Pumps: AROM;Both;10 reps Quad Sets: AROM;Both;10 reps Long Arc Quad: AROM;Right;10 reps   PT Diagnosis:    PT Problem List:   PT Treatment Interventions:     PT Goals Acute Rehab PT Goals PT Goal Formulation: With patient Time For Goal Achievement: 06/24/12 Potential to Achieve Goals: Good PT Goal: Sit to Stand - Progress: Progressing toward goal PT Goal: Stand to Sit - Progress: Progressing toward goal PT Goal: Ambulate - Progress: Progressing toward goal  Visit Information  Last PT Received On: 06/18/12 Assistance Needed: +1    Subjective Data  Subjective: Oh, good.  I'm starting to get stiff sitting here.     Cognition  Overall Cognitive Status: Appears within functional limits for tasks assessed/performed Arousal/Alertness: Awake/alert Orientation Level: Appears intact for tasks assessed Behavior During Session: [facility] for tasks performed    Balance  Balance Balance Assessed: No  End of Session PT - End of Session Equipment Utilized During Treatment: Gait belt Activity Tolerance: Patient tolerated treatment well Patient left: in chair;with call bell/phone within reach;with family/visitor present Nurse Communication: Mobility status;Precautions   GP     Sunny Schlein, [employer] [phone] 06/18/2012, 12:41 PM

## 2012-06-18 NOTE — Progress Notes (Signed)
CARE MANAGEMENT NOTE 06/18/2012  Patient:  [facility]   Account Number:  [id number]  Date Initiated:  06/18/2012  Documentation initiated by:  Vance Peper  Subjective/Objective Assessment:   71 yr old female s/p right hip revision     Action/Plan:   Patient is for shortterm rehab at [facility].Social worker aware.   Anticipated DC Date:  06/19/2012   Anticipated DC Plan:  HOME W HOME HEALTH SERVICES      DC Planning Services  CM consult      Choice offered to / List presented to:             Status of service:  Completed, signed off Medicare Important Message given?   (If response is "NO", the following Medicare IM given date fields will be blank) Date Medicare IM given:   Date Additional Medicare IM given:    Discharge Disposition:  SKILLED NURSING FACILITY  Per UR Regulation:    If discussed at Long Length of Stay Meetings, dates discussed:    Comments:

## 2012-06-18 NOTE — Progress Notes (Signed)
Patient ID: [name], female   DOB: [birth year], 71 y.o.   MRN: [id number] PATIENT ID: [name]  MRN: [id number]  DOB/AGE:  1941-01-14 / 71 y.o.  1 Day Post-Op Procedure(s) (LRB): TOTAL HIP REVISION (Right)    PROGRESS NOTE Subjective: Patient is alert, oriented,no Nausea, no Vomiting, yes passing gas, no Bowel Movement. Taking PO well. Denies SOB, Chest or Calf Pain. Using Incentive Spirometer, PAS in place. Ambulate WBAT yesterday Patient reports pain as 2 on 0-10 scale  .    Objective: Vital signs in last 24 hours: Filed Vitals:   06/17/12 2334 06/18/12 0200 06/18/12 0400 06/18/12 0600  BP:  123/47  146/61  Pulse:  64  66  Temp:  97.3 F (36.3 C)  97.6 F (36.4 C)  TempSrc:      Resp: 16 18 16 18   SpO2: 98% 100% 97% 100%      Intake/Output from previous day: I/O last 3 completed shifts: In: 4700 [I.V.:2800; Other:1600; IV Piggyback:300] Out: 400 [Urine:200; Blood:200]   Intake/Output this shift:     LABORATORY DATA:  Basename 06/18/12 0640 06/17/12 1013  WBC 8.9 --  HGB 11.5* --  HCT 34.5* --  PLT 214 --  NA -- --  K -- --  CL -- --  CO2 -- --  BUN -- --  CREATININE -- --  GLUCOSE -- --  GLUCAP -- 116*  INR -- --  CALCIUM -- --    Examination: Neurologically intact ABD soft Neurovascular intact Sensation intact distally Intact pulses distally Dorsiflexion/Plantar flexion intact Incision: moderate drainage No cellulitis present Compartment soft} XR AP&Lat of hip shows well placed\fixed THA  Assessment:   1 Day Post-Op Procedure(s) (LRB): TOTAL HIP REVISION (Right) ADDITIONAL DIAGNOSIS:    Plan: PT/OT WBAT, THA  posterior precautions  DVT Prophylaxis: SCDx72 hrs, ASA 325 mg BID x 2 weeks  DISCHARGE PLAN: Skilled Nursing Facility/Rehab, [facility] tomorrow  DISCHARGE NEEDS: HHPT, HHRN, CPM, Walker and 3-in-1 comode seat

## 2012-06-18 NOTE — Evaluation (Signed)
Occupational Therapy Evaluation Patient Details Name: [name] MRN: [id number] DOB: November 02, 1940 Today's Date: 06/18/2012 Time: 4696-2952 OT Time Calculation (min): 17 min  OT Assessment / Plan / Recommendation Clinical Impression  71 yo female s/p Rt THA revision with posterior hip precautions and no abduction that could benefit from skilled OT acute. Recommend SNf for d/c planning    OT Assessment  Patient needs continued OT Services    Follow Up Recommendations  SNF    Barriers to Discharge      Equipment Recommendations  3 in 1 bedside comode (RW)    Recommendations for Other Services    Frequency  Min 2X/week    Precautions / Restrictions Precautions Precautions: Posterior Hip;Fall Precaution Booklet Issued: Yes (comment) Precaution Comments: *No Active Abduction. Restrictions RLE Weight Bearing: Weight bearing as tolerated   Pertinent Vitals/Pain Pt currently with discomfort s/p mobility. Pt declined pain medication from RN ~1 hour prior. Pt educated on pain management and edema management. RN Bev notified of patient >7 out 10 pain.     ADL  Grooming: Wash/dry hands;Wash/dry face;Set up Where Assessed - Grooming: Unsupported sitting Lower Body Bathing: +1 Total assistance (needs AE education will defer SNF) Toilet Transfer: Supervision/safety Toilet Transfer Method: Sit to stand Toilet Transfer Equipment: Raised toilet seat with arms (or 3-in-1 over toilet) Equipment Used: Rolling walker Transfers/Ambulation Related to ADLs: stand pivot to chair min guard (A) ADL Comments: pt will completed bed mobility with cues to provide crossing BIL LE and abduction. Pt states "oh we need to practice this more"- Pt requires (A) for bed mobilityl    OT Diagnosis: Generalized weakness;Acute pain  OT Problem List: Decreased strength;Decreased range of motion;Decreased activity tolerance;Impaired balance (sitting and/or standing);Decreased safety awareness;Decreased knowledge  of use of DME or AE;Decreased knowledge of precautions;Pain OT Treatment Interventions: Self-care/ADL training;DME and/or AE instruction;Therapeutic activities;Patient/family education;Balance training   OT Goals Acute Rehab OT Goals OT Goal Formulation: With patient Time For Goal Achievement: 07/02/12 Potential to Achieve Goals: Good ADL Goals Pt Will Perform Lower Body Dressing: with supervision;Sit to stand from chair;with adaptive equipment ADL Goal: Lower Body Dressing - Progress: Goal set today Miscellaneous OT Goals Miscellaneous OT Goal #1: Pt will complete bed mobility Min (A) as precursor to adls OT Goal: Miscellaneous Goal #1 - Progress: Goal set today  Visit Information  Last OT Received On: 06/18/12 Assistance Needed: +1    Subjective Data  Subjective: "My husband just had cardiac surgery Patient Stated Goal: to go to [facility]   Prior Functioning     Home Living Lives With: Spouse Available Help at Discharge: Family;Available PRN/intermittently Type of Home: House Home Access: Stairs to enter [employer] of Steps: 2 Entrance Stairs-Rails: Right Home Layout: One level Additional Comments: pt plans on D/C to SNF for rehab prior to home with husband.   Prior Function Level of Independence: Needs assistance Needs Assistance: Light Housekeeping Light Housekeeping: Moderate Able to Take Stairs?: Yes Vocation: Retired Musician: No difficulties Dominant Hand: Right         Vision/Perception     Cognition  Overall Cognitive Status: Appears within functional limits for tasks assessed/performed Arousal/Alertness: Awake/alert Orientation Level: Appears intact for tasks assessed Behavior During Session: [facility] for tasks performed    Extremity/Trunk Assessment Right Upper Extremity Assessment RUE ROM/Strength/Tone: Within functional levels Left Upper Extremity Assessment LUE ROM/Strength/Tone: Within functional levels      Mobility Bed Mobility Supine to Sit: 3: Mod assist Sit to Supine: 3: Mod assist Details for Bed  Mobility Assistance: cues for hand placement and hip precautions. pt attempts to cross bil LE to (A) operated Rt LE. Transfers Transfers: Sit to Stand;Stand to Sit Sit to Stand: 4: Min guard;With upper extremity assist;From bed Stand to Sit: 4: Min guard;With upper extremity assist;To chair/3-in-1 Details for Transfer Assistance: good carry over of hand placement this session     Shoulder Instructions     Exercise     Balance     End of Session OT - End of Session Activity Tolerance: Patient tolerated treatment well Patient left: in chair;with call bell/phone within reach;with family/visitor present Nurse Communication: Mobility status;Precautions  GO     Lucile Shutters 06/18/2012, 12:13 PM Pager: [phone]

## 2012-06-19 DIAGNOSIS — T84018A Broken internal joint prosthesis, other site, initial encounter: Secondary | ICD-10-CM

## 2012-06-19 DIAGNOSIS — Z96649 Presence of unspecified artificial hip joint: Secondary | ICD-10-CM

## 2012-06-19 LAB — CBC
HCT: 30.7 % — ABNORMAL LOW (ref 36.0–46.0)
MCHC: 33.9 g/dL (ref 30.0–36.0)
WBC: 10.4 10*3/uL (ref 4.0–10.5)

## 2012-06-19 MED ORDER — BISACODYL 10 MG RE SUPP
10.0000 mg | Freq: Every day | RECTAL | Status: DC | PRN
  Administered 2012-06-19: 10 mg via RECTAL
  Filled 2012-06-19: qty 1

## 2012-06-19 MED ORDER — METHOCARBAMOL 500 MG PO TABS: 500.0000 mg | ORAL_TABLET | Freq: Four times a day (QID) | ORAL | Status: DC | PRN

## 2012-06-19 MED ORDER — BISACODYL 5 MG PO TBEC: 5.0000 mg | DELAYED_RELEASE_TABLET | Freq: Every day | ORAL | Status: DC | PRN

## 2012-06-19 MED ORDER — OXYCODONE-ACETAMINOPHEN 5-325 MG PO TABS: 1.0000 | ORAL_TABLET | ORAL | Status: DC | PRN

## 2012-06-19 MED ORDER — ASPIRIN 325 MG PO TBEC: 325.0000 mg | DELAYED_RELEASE_TABLET | Freq: Two times a day (BID) | ORAL | Status: DC

## 2012-06-19 NOTE — Progress Notes (Signed)
PATIENT ID: [name]  MRN: [id number]  DOB/AGE:  [birth year] / 69 y.o.  2 Days Post-Op Procedure(s) (LRB): TOTAL HIP REVISION (Right)    PROGRESS NOTE Subjective: Patient is alert, oriented,no Nausea, no Vomiting, yes passing gas, no Bowel Movement. Taking PO well. Denies SOB, Chest or Calf Pain. Using Incentive Spirometer, PAS in place. Ambulating slowly with PT. Patient reports pain as moderate  .    Objective: Vital signs in last 24 hours: Filed Vitals:   06/18/12 1337 06/18/12 1920 06/18/12 2134 06/19/12 0546  BP: 150/62  155/51 168/79  Pulse: 71  76 86  Temp: 98 F (36.7 C)  98.5 F (36.9 C) 97.8 F (36.6 C)  TempSrc:      Resp: 18  18 18   Height:  5\' 7"  (1.702 m)    Weight:  94.4 kg (208 lb 1.8 oz)    SpO2: 100%  100% 98%      Intake/Output from previous day: I/O last 3 completed shifts: In: 3795 [P.O.:720; I.V.:1375; Other:1600; IV Piggyback:100] Out: -    Intake/Output this shift:     LABORATORY DATA:  Basename 06/19/12 0533 06/18/12 0640 06/17/12 1013  WBC 10.4 8.9 --  HGB 10.4* 11.5* --  HCT 30.7* 34.5* --  PLT 227 214 --  NA -- 137 --  K -- 5.0 --  CL -- 103 --  CO2 -- 26 --  BUN -- 12 --  CREATININE -- 0.78 --  GLUCOSE -- 98 --  GLUCAP -- -- 116*  INR -- -- --  CALCIUM -- 10.4 --    Examination: Neurologically intact ABD soft Neurovascular intact Sensation intact distally Intact pulses distally Dorsiflexion/Plantar flexion intact Incision: dressing C/D/I} XR AP&Lat of hip shows well placed\fixed THA  Assessment:   2 Days Post-Op Procedure(s) (LRB): TOTAL HIP REVISION (Right) ADDITIONAL DIAGNOSIS:  none  Plan: PT/OT WBAT, THA  posterior precautions  DVT Prophylaxis: SCDx72 hrs, ASA 325 mg BID x 2 weeks  DISCHARGE PLAN: Skilled Nursing Facility/Rehab Thursday  DISCHARGE NEEDS: HHPT, HHRN, Walker and 3-in-1 comode seat

## 2012-06-19 NOTE — Progress Notes (Signed)
Physical Therapy Treatment Patient Details Name: [name] MRN: [id number] DOB: [birth year] Today's Date: 06/19/2012 Time: 7829-5621 PT Time Calculation (min): 23 min  PT Assessment / Plan / Recommendation Comments on Treatment Session  pt rpesents with R THRevision and No active Abduction.  pt and husband concerned about bed mobility with hip precautions and no active abduction.  Anticipate D/C to SNF tomorrow.      Follow Up Recommendations  SNF     Does the patient have the potential to tolerate intense rehabilitation     Barriers to Discharge        Equipment Recommendations  Rolling walker with 5" wheels (3-in-1)    Recommendations for Other Services    Frequency Min 6X/week   Plan Discharge plan remains appropriate;Frequency remains appropriate    Precautions / Restrictions Precautions Precautions: Posterior Hip;Fall Precaution Booklet Issued: Yes (comment) Precaution Comments: *No Active Abduction. Restrictions Weight Bearing Restrictions: Yes RLE Weight Bearing: Weight bearing as tolerated   Pertinent Vitals/Pain Indicates stiffness, but premedicated.    Mobility  Bed Mobility Bed Mobility: Sit to Sidelying Left Sit to Sidelying Left: 3: Mod assist;HOB flat Details for Bed Mobility Assistance: Cues for log roll technique to avoid R hip abduction.   Transfers Transfers: Sit to Stand;Stand to Sit Sit to Stand: 4: Min guard;With upper extremity assist;From chair/3-in-1;With armrests Stand to Sit: 4: Min guard;With upper extremity assist;To bed Details for Transfer Assistance: good carry over of hand placement this session Ambulation/Gait Ambulation/Gait Assistance: 4: Min guard Ambulation Distance (Feet): 250 Feet Assistive device: Rolling walker Ambulation/Gait Assistance Details: cues for more fluid gait pattern, upright posture, decreasing WBing on UEs.   Gait Pattern: Step-through pattern;Decreased step length - left;Decreased stance time -  right;Trunk flexed Stairs: No Wheelchair Mobility Wheelchair Mobility: No    Exercises     PT Diagnosis:    PT Problem List:   PT Treatment Interventions:     PT Goals Acute Rehab PT Goals Time For Goal Achievement: 06/24/12 PT Goal: Sit to Supine/Side - Progress: Progressing toward goal PT Goal: Sit to Stand - Progress: Progressing toward goal PT Goal: Stand to Sit - Progress: Progressing toward goal PT Goal: Ambulate - Progress: Progressing toward goal  Visit Information  Last PT Received On: 06/19/12 Assistance Needed: +1    Subjective Data  Subjective: I walked earlier this morning.     Cognition  Overall Cognitive Status: Appears within functional limits for tasks assessed/performed Arousal/Alertness: Awake/alert Orientation Level: Appears intact for tasks assessed Behavior During Session: [facility] for tasks performed    Balance  Balance Balance Assessed: No  End of Session PT - End of Session Equipment Utilized During Treatment: Gait belt Activity Tolerance: Patient tolerated treatment well Patient left: in bed;with call bell/phone within reach;with family/visitor present Nurse Communication: Mobility status;Precautions   GP     Sunny Schlein, [place] [phone] 06/19/2012, 11:52 AM

## 2012-06-19 NOTE — Clinical Social Work Placement (Signed)
Clinical Social Work Department  CLINICAL SOCIAL WORK PLACEMENT NOTE  06/16/2012  Patient: [name]  Account Number: [id number] Admit date:  06/17/12 Clinical Social Worker: Sabino Niemann MSW Date/time: 06/19/2012 1:30 AM  Clinical Social Work is seeking post-discharge placement for this patient at the following level of care: SKILLED NURSING (*CSW will update this form in Epic as items are completed)  06/19/2012 Patient/family provided with [facility] Department of Clinical Social Work's list of facilities offering this level of care within the geographic area requested by the patient (or if unable, by the patient's family).  06/19/2012 Patient/family informed of their freedom to choose among providers that offer the needed level of care, that participate in Medicare, Medicaid or managed care program needed by the patient, have an available bed and are willing to accept the patient.  06/19/2012 Patient/family informed of [facility]' ownership interest in [facility], as well as of the fact that they are under no obligation to receive care at this facility.  PASARR submitted to EDS on  PASARR number received from EDS on  FL2 transmitted to all facilities in geographic area requested by pt/family on 06/19/2012  FL2 transmitted to all facilities within larger geographic area on  Patient informed that his/her managed care company has contracts with or will negotiate with certain facilities, including the following:  Patient/family informed of bed offers received:  Patient chooses bed at  Physician recommends and patient chooses bed at  Patient to be transferred to on  Patient to be transferred to facility by  The following physician request were entered in Epic:  Additional Comments:  Sabino Niemann, MSW  [phone]

## 2012-06-19 NOTE — Discharge Summary (Addendum)
Patient ID: [name] MRN: [id number] DOB/AGE: 02-12-1941 71 y.o.  Admit date: 06/17/2012 Discharge date: 06/20/12  Admission Diagnoses:  Principal Problem:  *Failed total hip arthroplasty - right   Discharge Diagnoses:  Same  Past Medical History  Diagnosis Date  . Hypertension   . Obesity   . Allergic rhinitis   . 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 Osteopenia 11/26/2009  . Breast cancer     "right" (06/17/2012)  . Exertional dyspnea   . Thyroid disorder     "sees endocrinologist yearly" (06/17/2012)  . Iron deficiency anemia     "hematologist watches it" (06/17/2012)  . Osteoarthritis     severe right hip osteoarthritis-s/p hip replacement  . Arthritis     "knuckles" (06/17/2012)    Surgeries: Procedure(s): TOTAL HIP REVISION on 06/17/2012   Consultants:    Discharged Condition: Improved  Hospital Course: [name] is an 71 y.o. female who was admitted 06/17/2012 for operative treatment ofFailed total hip arthroplasty. Patient has severe unremitting pain that affects sleep, daily activities, and work/hobbies. After pre-op clearance the patient was taken to the operating room on 06/17/2012 and underwent  Procedure(s): TOTAL HIP REVISION.    Patient was given perioperative antibiotics: Anti-infectives     Start     Dose/Rate Route Frequency Ordered Stop   06/16/12 0901   ceFAZolin (ANCEF) IVPB 2 g/50 mL premix        2 g 100 mL/hr over 30 Minutes Intravenous 60 min pre-op 06/16/12 0901 06/17/12 0739           Patient was given sequential compression devices, early ambulation, and chemoprophylaxis to prevent DVT.  Patient benefited maximally from hospital stay and there were no complications.    Recent vital signs: Patient Vitals for the past 24 hrs:  BP Temp Pulse Resp SpO2 Height Weight  06/19/12 1345 129/54 mmHg 98.1 F (36.7 C) 74  18  100 % - -  06/19/12 0546 168/79 mmHg 97.8 F (36.6 C) 86  18  98 % - -  07-13-2012 2134 155/51 mmHg  98.5 F (36.9 C) 76  18  100 % - -  07-13-2012 1920 - - - - - 5\' 7"  (1.702 m) 94.4 kg (208 lb 1.8 oz)     Recent laboratory studies:  [facility] 06/19/12 0533 07-13-2012 0640  WBC 10.4 8.9  HGB 10.4* 11.5*  HCT 30.7* 34.5*  PLT 227 214  NA -- 137  K -- 5.0  CL -- 103  CO2 -- 26  BUN -- 12  CREATININE -- 0.78  GLUCOSE -- 98  INR -- --  CALCIUM -- 10.4     Discharge Medications:     Medication List     As of 06/19/2012  3:26 PM    STOP taking these medications         traMADol 50 MG tablet   Commonly known as: ULTRAM      TAKE these medications         amoxicillin 500 MG capsule   Commonly known as: AMOXIL   Take 2,000 mg by mouth as needed. before dental work.      aspirin 325 MG EC tablet   Take 1 tablet (325 mg total) by mouth 2 (two) times daily.      bisacodyl 5 MG EC tablet   Commonly known as: DULCOLAX   Take 1 tablet (5 mg total) by mouth daily as needed.      bisoprolol 10 MG tablet   Commonly known  as: ZEBETA   Take 10 mg by mouth daily.      cholecalciferol 1000 UNITS tablet   Commonly known as: VITAMIN D   Take 4,000 Units by mouth daily.      fexofenadine 180 MG tablet   Commonly known as: ALLEGRA   Take 180 mg by mouth daily as needed. For seasonal allergies      Fish Oil 1000 MG Caps   Take 1,000 mg by mouth daily.      fluticasone 50 MCG/ACT nasal spray   Commonly known as: FLONASE   Place 2 sprays into the nose daily.      Gelatin 600 MG Caps   Take 600 mg by mouth 2 (two) times daily.      losartan 100 MG tablet   Commonly known as: COZAAR   Take 100 mg by mouth daily.      methocarbamol 500 MG tablet   Commonly known as: ROBAXIN   Take 1 tablet (500 mg total) by mouth every 6 (six) hours as needed.      oxyCODONE-acetaminophen 5-325 MG per tablet   Commonly known as: PERCOCET/ROXICET   Take 1-2 tablets by mouth every 4 (four) hours as needed for pain.      simvastatin 10 MG tablet   Commonly known as: ZOCOR   Take 1 tablet  (10 mg total) by mouth at bedtime.        Diagnostic Studies: Dg Chest 2 View  06/10/2012  *RADIOLOGY REPORT*  Clinical Data: Presurgical evaluation prior to total hip arthroplasty  CHEST - 2 VIEW  Comparison: Prior chest x-ray 12/11/2006  Findings: The lungs are well-aerated and free from pulmonary edema, focal airspace consolidation or pulmonary nodule.  Cardiac and mediastinal contours are within normal limits.  No pneumothorax, or pleural effusion. No acute osseous findings. Surgical clips in the right axilla, unchanged.    IMPRESSION:  No acute cardiopulmonary disease.   Original Report Authenticated By: Malachy Moan, M.D.    Dg Pelvis Portable  06/17/2012  *RADIOLOGY REPORT*  Clinical Data: Status post right hip arthroplasty.  PORTABLE PELVIS  Comparison: None.  Findings: Status post right total hip arthroplasty.  The femoral and acetabular components appear to be well situated.  No fracture or dislocation is noted.  Left hip joint appears normal.  IMPRESSION: Status post right total hip arthroplasty.  No acute abnormality seen.   Original Report Authenticated By: Lupita Raider.,  M.D.    Dg Hip Portable 1 View Right  06/17/2012  *RADIOLOGY REPORT*  Clinical Data: Postop from right hip arthroplasty.  PORTABLE RIGHT HIP - 1 VIEW  Comparison: None.  Findings: Cross-table lateral view of the right hip shows a bipolar right hip prosthesis in expected position.  No evidence of prosthetic dislocation.  No definite fracture identified although bony acetabulum not well visualized on this radiograph.  IMPRESSION: Bipolar right hip prosthesis in appropriate location.  No evidence of dislocation.   Original Report Authenticated By: Myles Rosenthal, M.D.     Disposition:       Discharge Orders    Future Appointments: Provider: Department: Dept Phone: Center:   06/26/2012 10:30 AM Sandford Craze, NP [facility] [phone] [facility]     Future Orders Please Complete By  Expires   Increase activity slowly      [facility]       May shower / Bathe      Driving Restrictions      Comments:   No driving  for 2 weeks.   Change dressing (specify)      Comments:   Dressing change as needed.   Call MD for:  temperature >100.4      Call MD for:  severe uncontrolled pain      Call MD for:  redness, tenderness, or signs of infection (pain, swelling, redness, odor or green/yellow discharge around incision site)      Discharge instructions      Comments:   F/U with Dr. Turner Daniels as scheduled (POD #14)         Signed: Hazle Nordmann. 06/19/2012, 3:26 PM    ADDENDUM: Strict hip precautions with physical therapy, limit active abduction.

## 2012-06-19 NOTE — Progress Notes (Addendum)
Clinical Social Work Department  BRIEF PSYCHOSOCIAL ASSESSMENT  Patient: [name] Account Number: [id number]  Admit date:  Clinical Social Worker Date/Time: 06/19/12  Referred by: Physician Date Referred: 06/18/12  Referred for   SNF Placement   Other Referral:  Interview type: Patient  Other interview type:  PSYCHOSOCIAL DATA  Living Status: Lives with husband Admitted from facility:  Level of care:  Primary support name: Richard Dyke Primary support relationship to patient: husband Degree of support available:  Strong and Vested   CURRENT CONCERNS  Current Concerns   Post-Acute Placement   Other Concerns:  SOCIAL WORK ASSESSMENT / PLAN  CSW met with pt re: PT recommendation for SNF.   Pt lives with husband in [place]  CSW explained placement process and answered questions.   Pt reports that she would like a bed at [facility] in [place]. [facility] in [place] is her second choice  CSW completed FL2 and initiated [facility] search.   CSW to f/u with offers.   Assessment/plan status: Information/Referral to [facility]  Other assessment/ plan:  Information/referral to community resources:  SNF Choice list   PTAR   PATIENT'S/FAMILY'S RESPONSE TO PLAN OF CARE:  Pt reports agreeable to ST SNF in order to increase strength and independence with mobility prior to return home with her grandson. Pt verbalized understanding of placement process and appreciation for CSW assist.   Sabino Niemann MSW  [phone]

## 2012-06-20 DIAGNOSIS — D649 Anemia, unspecified: Secondary | ICD-10-CM | POA: Diagnosis not present

## 2012-06-20 DIAGNOSIS — Z5189 Encounter for other specified aftercare: Secondary | ICD-10-CM | POA: Diagnosis not present

## 2012-06-20 DIAGNOSIS — E785 Hyperlipidemia, unspecified: Secondary | ICD-10-CM | POA: Diagnosis not present

## 2012-06-20 DIAGNOSIS — D509 Iron deficiency anemia, unspecified: Secondary | ICD-10-CM | POA: Diagnosis not present

## 2012-06-20 DIAGNOSIS — Z471 Aftercare following joint replacement surgery: Secondary | ICD-10-CM | POA: Diagnosis not present

## 2012-06-20 DIAGNOSIS — M1610 Unilateral primary osteoarthritis, unspecified hip: Secondary | ICD-10-CM | POA: Diagnosis not present

## 2012-06-20 DIAGNOSIS — M25559 Pain in unspecified hip: Secondary | ICD-10-CM | POA: Diagnosis not present

## 2012-06-20 DIAGNOSIS — Z96649 Presence of unspecified artificial hip joint: Secondary | ICD-10-CM | POA: Diagnosis not present

## 2012-06-20 DIAGNOSIS — R279 Unspecified lack of coordination: Secondary | ICD-10-CM | POA: Diagnosis not present

## 2012-06-20 DIAGNOSIS — Z4801 Encounter for change or removal of surgical wound dressing: Secondary | ICD-10-CM | POA: Diagnosis not present

## 2012-06-20 DIAGNOSIS — M6281 Muscle weakness (generalized): Secondary | ICD-10-CM | POA: Diagnosis not present

## 2012-06-20 DIAGNOSIS — J301 Allergic rhinitis due to pollen: Secondary | ICD-10-CM | POA: Diagnosis not present

## 2012-06-20 DIAGNOSIS — I10 Essential (primary) hypertension: Secondary | ICD-10-CM | POA: Diagnosis not present

## 2012-06-20 DIAGNOSIS — R7309 Other abnormal glucose: Secondary | ICD-10-CM | POA: Diagnosis not present

## 2012-06-20 LAB — CBC
HCT: 31.6 % — ABNORMAL LOW (ref 36.0–46.0)
MCHC: 32.9 g/dL (ref 30.0–36.0)
MCV: 92.1 fL (ref 78.0–100.0)
Platelets: 246 10*3/uL (ref 150–400)
RDW: 13.9 % (ref 11.5–15.5)
WBC: 10.7 10*3/uL — ABNORMAL HIGH (ref 4.0–10.5)

## 2012-06-20 LAB — TISSUE CULTURE

## 2012-06-20 NOTE — Progress Notes (Signed)
Physical Therapy Treatment Patient Details Name: [name] MRN: [id number] DOB: 1941-04-27 Today's Date: 06/20/2012 Time: 0272-5366 PT Time Calculation (min): 16 min  PT Assessment / Plan / Recommendation Comments on Treatment Session  pt presents with R THRevision.  pt moving well today, though notes she had a rough start to the morning and had increased pain.      Follow Up Recommendations  SNF     Does the patient have the potential to tolerate intense rehabilitation     Barriers to Discharge        Equipment Recommendations  Rolling walker with 5" wheels (3-in-1)    Recommendations for Other Services    Frequency Min 6X/week   Plan Discharge plan remains appropriate;Frequency remains appropriate    Precautions / Restrictions Precautions Precautions: Posterior Hip;Fall Precaution Booklet Issued: Yes (comment) Precaution Comments: *No Active Abduction. Restrictions Weight Bearing Restrictions: Yes RLE Weight Bearing: Weight bearing as tolerated   Pertinent Vitals/Pain Indicates increased stiffness and muscle tightness.    Mobility  Bed Mobility Bed Mobility: Not assessed Transfers Transfers: Sit to Stand;Stand to Sit Sit to Stand: 5: Supervision;With upper extremity assist;From chair/3-in-1;With armrests Stand to Sit: 5: Supervision;With upper extremity assist;To chair/3-in-1;With armrests Details for Transfer Assistance: demos good technique.   Ambulation/Gait Ambulation/Gait Assistance: 4: Min guard Ambulation Distance (Feet): 300 Feet Assistive device: Rolling walker Ambulation/Gait Assistance Details: cues for more fluid gait pattern, upright posture Gait Pattern: Step-through pattern;Decreased step length - left;Decreased stance time - right;Trunk flexed Stairs: No Wheelchair Mobility Wheelchair Mobility: No    Exercises     PT Diagnosis:    PT Problem List:   PT Treatment Interventions:     PT Goals Acute Rehab PT Goals Time For Goal  Achievement: 06/24/12 Potential to Achieve Goals: Good PT Goal: Sit to Stand - Progress: Progressing toward goal PT Goal: Stand to Sit - Progress: Progressing toward goal PT Goal: Ambulate - Progress: Progressing toward goal  Visit Information  Last PT Received On: 06/20/12 Assistance Needed: +1    Subjective Data  Subjective: I'm ready to go to rehab today.     Cognition  Overall Cognitive Status: Appears within functional limits for tasks assessed/performed Arousal/Alertness: Awake/alert Orientation Level: Appears intact for tasks assessed Behavior During Session: [facility] for tasks performed    Balance  Balance Balance Assessed: No  End of Session PT - End of Session Equipment Utilized During Treatment: Gait belt Activity Tolerance: Patient tolerated treatment well Patient left: in chair;with call bell/phone within reach Nurse Communication: Mobility status;Precautions   GP     Sunny Schlein, [place] [phone] 06/20/2012, 12:33 PM

## 2012-06-20 NOTE — Progress Notes (Signed)
PATIENT ID: [name]  MRN: [id number]  DOB/AGE:  08-04-1940 / 71 y.o.  3 Days Post-Op Procedure(s) (LRB): TOTAL HIP REVISION (Right)    PROGRESS NOTE Subjective: Patient is alert, oriented,no Nausea, no Vomiting, yes passing gas, no Bowel Movement. Taking PO well. Denies SOB, Chest or Calf Pain. Using Incentive Spirometer, PAS in place. Ambulating slowly with PT. Patient reports pain as moderate  .    Objective: Vital signs in last 24 hours: Filed Vitals:   06/19/12 0546 06/19/12 1345 06/19/12 2132 06/20/12 0521  BP: 168/79 129/54 131/60 142/60  Pulse: 86 74 83 76  Temp: 97.8 F (36.6 C) 98.1 F (36.7 C) 98.9 F (37.2 C) 98 F (36.7 C)  TempSrc:      Resp: 18 18 18 18   Height:      Weight:      SpO2: 98% 100% 100% 100%      Intake/Output from previous day: I/O last 3 completed shifts: In: 1320 [P.O.:1320] Out: -    Intake/Output this shift: Total I/O In: 360 [P.O.:360] Out: -    LABORATORY DATA:  Basename 06/20/12 0655 06/19/12 0533 06/18/12 0640 06/17/12 1013  WBC 10.7* 10.4 -- --  HGB 10.4* 10.4* -- --  HCT 31.6* 30.7* -- --  PLT 246 227 -- --  NA -- -- 137 --  K -- -- 5.0 --  CL -- -- 103 --  CO2 -- -- 26 --  BUN -- -- 12 --  CREATININE -- -- 0.78 --  GLUCOSE -- -- 98 --  GLUCAP -- -- -- 116*  INR -- -- -- --  CALCIUM -- -- 10.4 --    Examination: Neurologically intact ABD soft Neurovascular intact Sensation intact distally Intact pulses distally Dorsiflexion/Plantar flexion intact Incision: scant drainage} XR AP&Lat of hip shows well placed\fixed THA  Assessment:   3 Days Post-Op Procedure(s) (LRB): TOTAL HIP REVISION (Right) ADDITIONAL DIAGNOSIS:  none  Plan: PT/OT WBAT, THA  posterior precautions  DVT Prophylaxis: SCDx72 hrs, ASA 325 mg BID x 2 weeks  DISCHARGE PLAN: Skilled Nursing Facility/Rehab today if bed available.  Needs to be transported by ambulance.  Patient is at high risk for dislocation secondary to metal reaction  and its effect on abductor muscle integrity.    DISCHARGE NEEDS: HHPT, HHRN, Walker and 3-in-1 comode seat

## 2012-06-20 NOTE — Progress Notes (Signed)
Discharged to home with family office visits in place teaching done  

## 2012-06-22 LAB — ANAEROBIC CULTURE

## 2012-06-25 DIAGNOSIS — M1610 Unilateral primary osteoarthritis, unspecified hip: Secondary | ICD-10-CM | POA: Diagnosis not present

## 2012-06-25 DIAGNOSIS — M25559 Pain in unspecified hip: Secondary | ICD-10-CM | POA: Diagnosis not present

## 2012-06-25 DIAGNOSIS — J301 Allergic rhinitis due to pollen: Secondary | ICD-10-CM | POA: Diagnosis not present

## 2012-06-25 DIAGNOSIS — I10 Essential (primary) hypertension: Secondary | ICD-10-CM | POA: Diagnosis not present

## 2012-06-25 DIAGNOSIS — D509 Iron deficiency anemia, unspecified: Secondary | ICD-10-CM | POA: Diagnosis not present

## 2012-06-26 ENCOUNTER — Ambulatory Visit: Payer: Medicare Other | Admitting: Family

## 2012-06-27 DIAGNOSIS — M6281 Muscle weakness (generalized): Secondary | ICD-10-CM | POA: Diagnosis not present

## 2012-06-27 DIAGNOSIS — R279 Unspecified lack of coordination: Secondary | ICD-10-CM | POA: Diagnosis not present

## 2012-06-27 DIAGNOSIS — J301 Allergic rhinitis due to pollen: Secondary | ICD-10-CM | POA: Diagnosis not present

## 2012-06-27 DIAGNOSIS — Z471 Aftercare following joint replacement surgery: Secondary | ICD-10-CM | POA: Diagnosis not present

## 2012-06-27 DIAGNOSIS — E785 Hyperlipidemia, unspecified: Secondary | ICD-10-CM | POA: Diagnosis not present

## 2012-06-27 DIAGNOSIS — I10 Essential (primary) hypertension: Secondary | ICD-10-CM | POA: Diagnosis not present

## 2012-07-04 DIAGNOSIS — T84099A Other mechanical complication of unspecified internal joint prosthesis, initial encounter: Secondary | ICD-10-CM | POA: Diagnosis not present

## 2012-07-04 DIAGNOSIS — I10 Essential (primary) hypertension: Secondary | ICD-10-CM | POA: Diagnosis not present

## 2012-07-04 DIAGNOSIS — M6281 Muscle weakness (generalized): Secondary | ICD-10-CM | POA: Diagnosis not present

## 2012-07-04 DIAGNOSIS — Z96649 Presence of unspecified artificial hip joint: Secondary | ICD-10-CM | POA: Diagnosis not present

## 2012-07-04 DIAGNOSIS — R269 Unspecified abnormalities of gait and mobility: Secondary | ICD-10-CM | POA: Diagnosis not present

## 2012-07-04 DIAGNOSIS — Z471 Aftercare following joint replacement surgery: Secondary | ICD-10-CM | POA: Diagnosis not present

## 2012-07-05 DIAGNOSIS — I10 Essential (primary) hypertension: Secondary | ICD-10-CM | POA: Diagnosis not present

## 2012-07-05 DIAGNOSIS — M6281 Muscle weakness (generalized): Secondary | ICD-10-CM | POA: Diagnosis not present

## 2012-07-05 DIAGNOSIS — Z96649 Presence of unspecified artificial hip joint: Secondary | ICD-10-CM | POA: Diagnosis not present

## 2012-07-05 DIAGNOSIS — Z471 Aftercare following joint replacement surgery: Secondary | ICD-10-CM | POA: Diagnosis not present

## 2012-07-05 DIAGNOSIS — R269 Unspecified abnormalities of gait and mobility: Secondary | ICD-10-CM | POA: Diagnosis not present

## 2012-07-08 ENCOUNTER — Telehealth: Payer: Self-pay | Admitting: Family

## 2012-07-08 DIAGNOSIS — I10 Essential (primary) hypertension: Secondary | ICD-10-CM | POA: Diagnosis not present

## 2012-07-08 DIAGNOSIS — Z471 Aftercare following joint replacement surgery: Secondary | ICD-10-CM | POA: Diagnosis not present

## 2012-07-08 DIAGNOSIS — M6281 Muscle weakness (generalized): Secondary | ICD-10-CM | POA: Diagnosis not present

## 2012-07-08 DIAGNOSIS — Z96649 Presence of unspecified artificial hip joint: Secondary | ICD-10-CM | POA: Diagnosis not present

## 2012-07-08 DIAGNOSIS — R269 Unspecified abnormalities of gait and mobility: Secondary | ICD-10-CM | POA: Diagnosis not present

## 2012-07-08 MED ORDER — LOSARTAN POTASSIUM 100 MG PO TABS: 100.0000 mg | ORAL_TABLET | Freq: Every day | ORAL | Status: DC

## 2012-07-08 NOTE — Telephone Encounter (Signed)
Refill sent; #90 x 1 refill.

## 2012-07-08 NOTE — Telephone Encounter (Signed)
Refill- losartan 100mg  tab. Take one tablet every day. Qty 90

## 2012-07-09 DIAGNOSIS — I10 Essential (primary) hypertension: Secondary | ICD-10-CM | POA: Diagnosis not present

## 2012-07-09 DIAGNOSIS — Z471 Aftercare following joint replacement surgery: Secondary | ICD-10-CM | POA: Diagnosis not present

## 2012-07-09 DIAGNOSIS — Z96649 Presence of unspecified artificial hip joint: Secondary | ICD-10-CM | POA: Diagnosis not present

## 2012-07-09 DIAGNOSIS — R269 Unspecified abnormalities of gait and mobility: Secondary | ICD-10-CM | POA: Diagnosis not present

## 2012-07-09 DIAGNOSIS — M6281 Muscle weakness (generalized): Secondary | ICD-10-CM | POA: Diagnosis not present

## 2012-07-11 DIAGNOSIS — Z96649 Presence of unspecified artificial hip joint: Secondary | ICD-10-CM | POA: Diagnosis not present

## 2012-07-11 DIAGNOSIS — Z471 Aftercare following joint replacement surgery: Secondary | ICD-10-CM | POA: Diagnosis not present

## 2012-07-11 DIAGNOSIS — R269 Unspecified abnormalities of gait and mobility: Secondary | ICD-10-CM | POA: Diagnosis not present

## 2012-07-11 DIAGNOSIS — I10 Essential (primary) hypertension: Secondary | ICD-10-CM | POA: Diagnosis not present

## 2012-07-11 DIAGNOSIS — M6281 Muscle weakness (generalized): Secondary | ICD-10-CM | POA: Diagnosis not present

## 2012-07-15 DIAGNOSIS — R269 Unspecified abnormalities of gait and mobility: Secondary | ICD-10-CM | POA: Diagnosis not present

## 2012-07-15 DIAGNOSIS — I10 Essential (primary) hypertension: Secondary | ICD-10-CM | POA: Diagnosis not present

## 2012-07-15 DIAGNOSIS — Z96649 Presence of unspecified artificial hip joint: Secondary | ICD-10-CM | POA: Diagnosis not present

## 2012-07-15 DIAGNOSIS — M6281 Muscle weakness (generalized): Secondary | ICD-10-CM | POA: Diagnosis not present

## 2012-07-15 DIAGNOSIS — Z471 Aftercare following joint replacement surgery: Secondary | ICD-10-CM | POA: Diagnosis not present

## 2012-07-16 ENCOUNTER — Encounter: Payer: Self-pay | Admitting: Family

## 2012-07-16 ENCOUNTER — Ambulatory Visit: Payer: Medicare Other | Admitting: Family

## 2012-07-16 ENCOUNTER — Ambulatory Visit (INDEPENDENT_AMBULATORY_CARE_PROVIDER_SITE_OTHER): Payer: Medicare Other | Admitting: Family

## 2012-07-16 VITALS — BP 120/88 | HR 86 | Temp 97.6°F | Resp 16 | Ht 67.0 in | Wt 203.1 lb

## 2012-07-16 DIAGNOSIS — I10 Essential (primary) hypertension: Secondary | ICD-10-CM | POA: Diagnosis not present

## 2012-07-16 DIAGNOSIS — Z96649 Presence of unspecified artificial hip joint: Secondary | ICD-10-CM

## 2012-07-16 DIAGNOSIS — E785 Hyperlipidemia, unspecified: Secondary | ICD-10-CM

## 2012-07-16 DIAGNOSIS — T84018A Broken internal joint prosthesis, other site, initial encounter: Secondary | ICD-10-CM

## 2012-07-16 DIAGNOSIS — R739 Hyperglycemia, unspecified: Secondary | ICD-10-CM

## 2012-07-16 DIAGNOSIS — R7309 Other abnormal glucose: Secondary | ICD-10-CM

## 2012-07-16 DIAGNOSIS — T84099A Other mechanical complication of unspecified internal joint prosthesis, initial encounter: Secondary | ICD-10-CM

## 2012-07-16 LAB — HEMOGLOBIN A1C
Hgb A1c MFr Bld: 5.9 % — ABNORMAL HIGH (ref <5.7)
Mean Plasma Glucose: 123 mg/dL — ABNORMAL HIGH (ref <117)

## 2012-07-16 NOTE — Progress Notes (Signed)
Subjective:    Patient ID: [name], female    DOB: Nov 29, 1940, 72 y.o.   MRN: [id number]  HPI  [name] is a 72 yr old female who presents today for follow up.  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  Now using [facility]. She sees Dr. Turner Daniels on 1/30. [facility] nurse thought incision was warm on Wednesday.  2) HTN-She continues losartan, bisoprolol.    3) Borderline DM-Last A1C on 9/17 was 6.0.    Review of Systems See HPI  Past Medical History  Diagnosis Date  . Hypertension   . Obesity   . Allergic rhinitis   . 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 Osteopenia 11/26/2009  . Breast cancer     "right" (06/17/2012)  . Exertional dyspnea   . Thyroid disorder     "sees endocrinologist yearly" (06/17/2012)  . Iron deficiency anemia     "hematologist watches it" (06/17/2012)  . Osteoarthritis     severe right hip osteoarthritis-s/p hip replacement  . Arthritis     "knuckles" (06/17/2012)    History   Social History  . Marital Status: Married    Spouse Name: N/A    Number of Children: N/A  . Years of Education: N/A   Occupational History  . Not on file.   Social History Main Topics  . Smoking status: Former Smoker -- 0.5 packs/day for 10 years    Types: Cigarettes    Quit date: 07/14/1974  . Smokeless tobacco: Never Used     Comment: quit in 1976 smoked for approx. 10 years  . Alcohol Use: 4.2 oz/week    7 Glasses of wine per week     Comment: drinks wine with dinner  . Drug Use: No  . Sexually Active: Not Currently   Other Topics Concern  . Not on file   Social History Narrative   RetiredThe patient is married and has one child and two grandchildren that live in the area.  She drinks wine with dinner.  She quit smoking in 1976, smoked forapproximately 10 years.   Moved to [place] from [place]    Past Surgical History  Procedure Date  . Total hip arthroplasty 04/2006    right hip   . Mastectomy 1993?      bilateral mastectomy  . Tonsillectomy 1949  . Eye surgery 2011    cataract removal both eyes  . Total hip revision 06/17/2012    "right" (06/17/2012)  . Breast biopsy ` 1992    "bilaterlly" (06/17/2012)  . Total hip revision 06/17/2012    Procedure: TOTAL HIP REVISION;  Surgeon: [name], MD;  Location: [facility] OR;  Service: Orthopedics;  Laterality: Right;    Family History  Problem Relation Age of Onset  . Heart failure Father     deceased age 70  . Diabetes Father   . Alcohol abuse Father   . Breast cancer Mother     deceased at age 30 secondary to breast cancer    Allergies  Allergen Reactions  . Sulfonamide Derivatives Swelling    REACTION: Swelling of the face; "eyes swelled shut"  . Chocolate Other (See Comments)    Sinus problems    Current Outpatient Prescriptions on File Prior to Visit  Medication Sig Dispense Refill  . bisoprolol (ZEBETA) 10 MG tablet Take 10 mg by mouth daily.      . cholecalciferol (VITAMIN D) 1000 UNITS tablet Take 4,000 Units by mouth  daily.      . fexofenadine (ALLEGRA) 180 MG tablet Take 180 mg by mouth daily as needed. For seasonal allergies      . fluticasone (FLONASE) 50 MCG/ACT nasal spray Place 2 sprays into the nose daily.      . Gelatin 600 MG CAPS Take 600 mg by mouth 2 (two) times daily.      Marland Kitchen losartan (COZAAR) 100 MG tablet Take 1 tablet (100 mg total) by mouth daily.  90 tablet  1  . methocarbamol (ROBAXIN) 500 MG tablet Take 1 tablet (500 mg total) by mouth every 6 (six) hours as needed.  60 tablet  0  . Omega-3 Fatty Acids (FISH OIL) 1000 MG CAPS Take 1,000 mg by mouth daily.       Marland Kitchen oxyCODONE-acetaminophen (ROXICET) 5-325 MG per tablet Take 1-2 tablets by mouth every 4 (four) hours as needed for pain.  60 tablet  0  . simvastatin (ZOCOR) 10 MG tablet Take 1 tablet (10 mg total) by mouth at bedtime.  90 tablet  3  . amoxicillin (AMOXIL) 500 MG capsule Take 2,000 mg by mouth as needed. before dental work.      Marland Kitchen aspirin EC 325  MG EC tablet Take 1 tablet (325 mg total) by mouth 2 (two) times daily.  30 tablet  0  . [DISCONTINUED] oxybutynin (DITROPAN) 5 MG tablet Take 1 tablet (5 mg total) by mouth 2 (two) times daily.  60 tablet  2    BP 120/88  Pulse 86  Temp 97.6 F (36.4 C) (Oral)  Resp 16  Ht 5\' 7"  (1.702 m)  Wt 203 lb 1.3 oz (92.116 kg)  BMI 31.81 kg/m2  SpO2 98%  LMP 07/11/1991       Objective:   Physical Exam  Constitutional: She is oriented to person, place, and time. She appears well-developed and well-nourished. No distress.  Cardiovascular: Normal rate and regular rhythm.   No murmur heard. Pulmonary/Chest: Effort normal and breath sounds normal. No respiratory distress. She has no wheezes. She has no rales. She exhibits no tenderness.  Musculoskeletal:       2+ bilateral LE edema is noted.  Neurological: She is alert and oriented to person, place, and time.  Skin: Skin is warm and dry.       Right hip incision is clean, dry, intact, no redness or oozing.    Psychiatric: She has a normal mood and affect. Her behavior is normal. Judgment and thought content normal.          Assessment & Plan:

## 2012-07-16 NOTE — Patient Instructions (Addendum)
Please schedule a follow up appointment in 3 months.

## 2012-07-16 NOTE — Assessment & Plan Note (Signed)
Will obtain A1C.   

## 2012-07-16 NOTE — Assessment & Plan Note (Signed)
BP stable on current meds.  Continue same.   BP Readings from Last 3 Encounters:  07/16/12 120/88  06/20/12 142/60  06/20/12 142/60

## 2012-07-16 NOTE — Assessment & Plan Note (Signed)
LDL 69.  Continue simvastatin.  She asked me about need to take aspirin for blood clot prevention.  She was taking 325mg  bid at the facility, now not taking.  I advised her to contact orthopedics for further recommendations on this.  Long term, I recommended a baby aspirin 81mg  for heart attack and stroke prevention.

## 2012-07-16 NOTE — Assessment & Plan Note (Signed)
S/p revision, no clinical sign of infection at incision site.  Rehab is going well.  Pt to follow up with Dr. Turner Daniels as scheduled.

## 2012-07-17 ENCOUNTER — Encounter: Payer: Self-pay | Admitting: Family

## 2012-07-17 DIAGNOSIS — I10 Essential (primary) hypertension: Secondary | ICD-10-CM | POA: Diagnosis not present

## 2012-07-17 DIAGNOSIS — Z471 Aftercare following joint replacement surgery: Secondary | ICD-10-CM | POA: Diagnosis not present

## 2012-07-17 DIAGNOSIS — R269 Unspecified abnormalities of gait and mobility: Secondary | ICD-10-CM | POA: Diagnosis not present

## 2012-07-17 DIAGNOSIS — M6281 Muscle weakness (generalized): Secondary | ICD-10-CM | POA: Diagnosis not present

## 2012-07-17 DIAGNOSIS — Z96649 Presence of unspecified artificial hip joint: Secondary | ICD-10-CM | POA: Diagnosis not present

## 2012-07-22 DIAGNOSIS — Z96649 Presence of unspecified artificial hip joint: Secondary | ICD-10-CM | POA: Diagnosis not present

## 2012-07-22 DIAGNOSIS — Z471 Aftercare following joint replacement surgery: Secondary | ICD-10-CM | POA: Diagnosis not present

## 2012-07-22 DIAGNOSIS — I10 Essential (primary) hypertension: Secondary | ICD-10-CM | POA: Diagnosis not present

## 2012-07-22 DIAGNOSIS — M6281 Muscle weakness (generalized): Secondary | ICD-10-CM | POA: Diagnosis not present

## 2012-07-22 DIAGNOSIS — R269 Unspecified abnormalities of gait and mobility: Secondary | ICD-10-CM | POA: Diagnosis not present

## 2012-07-23 ENCOUNTER — Encounter (HOSPITAL_COMMUNITY): Payer: Self-pay | Admitting: *Deleted

## 2012-07-23 ENCOUNTER — Emergency Department (HOSPITAL_COMMUNITY): Payer: Medicare Other

## 2012-07-23 ENCOUNTER — Emergency Department (HOSPITAL_COMMUNITY)
Admission: EM | Admit: 2012-07-23 | Discharge: 2012-07-24 | Disposition: A | Discharge status: Home / Self Care | Payer: Medicare Other | Attending: Emergency Medicine | Admitting: Emergency Medicine

## 2012-07-23 DIAGNOSIS — Z853 Personal history of malignant neoplasm of breast: Secondary | ICD-10-CM | POA: Diagnosis not present

## 2012-07-23 DIAGNOSIS — Y929 Unspecified place or not applicable: Secondary | ICD-10-CM | POA: Insufficient documentation

## 2012-07-23 DIAGNOSIS — E119 Type 2 diabetes mellitus without complications: Secondary | ICD-10-CM | POA: Diagnosis not present

## 2012-07-23 DIAGNOSIS — Z87891 Personal history of nicotine dependence: Secondary | ICD-10-CM | POA: Diagnosis not present

## 2012-07-23 DIAGNOSIS — Z8739 Personal history of other diseases of the musculoskeletal system and connective tissue: Secondary | ICD-10-CM | POA: Diagnosis not present

## 2012-07-23 DIAGNOSIS — Z862 Personal history of diseases of the blood and blood-forming organs and certain disorders involving the immune mechanism: Secondary | ICD-10-CM | POA: Insufficient documentation

## 2012-07-23 DIAGNOSIS — Z79899 Other long term (current) drug therapy: Secondary | ICD-10-CM | POA: Diagnosis not present

## 2012-07-23 DIAGNOSIS — T84029A Dislocation of unspecified internal joint prosthesis, initial encounter: Secondary | ICD-10-CM

## 2012-07-23 DIAGNOSIS — Z471 Aftercare following joint replacement surgery: Secondary | ICD-10-CM | POA: Diagnosis not present

## 2012-07-23 DIAGNOSIS — E669 Obesity, unspecified: Secondary | ICD-10-CM | POA: Insufficient documentation

## 2012-07-23 DIAGNOSIS — Y939 Activity, unspecified: Secondary | ICD-10-CM | POA: Insufficient documentation

## 2012-07-23 DIAGNOSIS — M25559 Pain in unspecified hip: Secondary | ICD-10-CM | POA: Diagnosis not present

## 2012-07-23 DIAGNOSIS — Z7982 Long term (current) use of aspirin: Secondary | ICD-10-CM | POA: Insufficient documentation

## 2012-07-23 DIAGNOSIS — X58XXXA Exposure to other specified factors, initial encounter: Secondary | ICD-10-CM | POA: Insufficient documentation

## 2012-07-23 DIAGNOSIS — I10 Essential (primary) hypertension: Secondary | ICD-10-CM | POA: Insufficient documentation

## 2012-07-23 DIAGNOSIS — R6889 Other general symptoms and signs: Secondary | ICD-10-CM | POA: Diagnosis not present

## 2012-07-23 DIAGNOSIS — Z96649 Presence of unspecified artificial hip joint: Secondary | ICD-10-CM | POA: Diagnosis not present

## 2012-07-23 LAB — BASIC METABOLIC PANEL
CO2: 23 mEq/L (ref 19–32)
GFR calc non Af Amer: 69 mL/min — ABNORMAL LOW (ref >90)
Glucose, Bld: 125 mg/dL — ABNORMAL HIGH (ref 70–99)
Potassium: 3.2 mEq/L — ABNORMAL LOW (ref 3.5–5.1)
Sodium: 135 mEq/L (ref 135–145)

## 2012-07-23 LAB — CBC WITH DIFFERENTIAL/PLATELET
Eosinophils Absolute: 0.1 10*3/uL (ref 0.0–0.7)
Lymphocytes Relative: 17 % (ref 12–46)
Lymphs Abs: 2.4 10*3/uL (ref 0.7–4.0)
Neutrophils Relative %: 75 % (ref 43–77)
Platelets: 278 10*3/uL (ref 150–400)
RBC: 3.99 MIL/uL (ref 3.87–5.11)
WBC: 13.8 10*3/uL — ABNORMAL HIGH (ref 4.0–10.5)

## 2012-07-23 MED ORDER — FENTANYL CITRATE 0.05 MG/ML IJ SOLN
50.0000 ug | Freq: Once | INTRAMUSCULAR | Status: AC
  Administered 2012-07-23: 50 ug via INTRAVENOUS
  Filled 2012-07-23: qty 2

## 2012-07-23 MED ORDER — PROPOFOL 10 MG/ML IV BOLUS
0.5000 mg/kg | Freq: Once | INTRAVENOUS | Status: AC
  Administered 2012-07-24: 46.1 mg via INTRAVENOUS
  Filled 2012-07-23: qty 20

## 2012-07-23 NOTE — ED Notes (Signed)
Pulses equal bilaterally in the lower ext.  Patient rates pain 4/10 to right hip

## 2012-07-23 NOTE — ED Provider Notes (Signed)
History     CSN: [id number]  Arrival date & time 07/23/12  2201   First MD Initiated Contact with Patient 07/23/12 2202      Chief Complaint  Patient presents with  . Hip Pain    (Consider location/radiation/quality/duration/timing/severity/associated sxs/prior treatment) Patient is a 72 y.o. female presenting with hip pain. The history is provided by the patient.  Hip Pain  She had a revision of a right total hip replacement done 1 month ago. She was doing exercises at home when she felt it pop out of place. She was unable to stand following that. She did take a pain medicine at home and as she is resting reasonably comfortably at this time. She denies any numbness or tingling. She last ate about 4 hours ago.  Past Medical History  Diagnosis Date  . Hypertension   . Obesity   . Allergic rhinitis   . 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 Osteopenia 11/26/2009  . Breast cancer     "right" (06/17/2012)  . Exertional dyspnea   . Thyroid disorder     "sees endocrinologist yearly" (06/17/2012)  . Iron deficiency anemia     "hematologist watches it" (06/17/2012)  . Osteoarthritis     severe right hip osteoarthritis-s/p hip replacement  . Arthritis     "knuckles" (06/17/2012)    Past Surgical History  Procedure Date  . Total hip arthroplasty 04/2006    right hip   . Mastectomy 1993?    bilateral mastectomy  . Tonsillectomy 1949  . Eye surgery 2011    cataract removal both eyes  . Total hip revision 06/17/2012    "right" (06/17/2012)  . Breast biopsy ` 1992    "bilaterlly" (06/17/2012)  . Total hip revision 06/17/2012    Procedure: TOTAL HIP REVISION;  Surgeon: [name], MD;  Location: [facility] OR;  Service: Orthopedics;  Laterality: Right;    Family History  Problem Relation Age of Onset  . Heart failure Father     deceased age 46  . Diabetes Father   . Alcohol abuse Father   . Breast cancer Mother     deceased at age 71 secondary to breast cancer    History    Substance Use Topics  . Smoking status: Former Smoker -- 0.5 packs/day for 10 years    Types: Cigarettes    Quit date: 07/14/1974  . Smokeless tobacco: Never Used     Comment: quit in 1976 smoked for approx. 10 years  . Alcohol Use: 4.2 oz/week    7 Glasses of wine per week     Comment: drinks wine with dinner    OB History    Grav Para Term Preterm Abortions TAB SAB Ect Mult Living                  Review of Systems  All other systems reviewed and are negative.    Allergies  Sulfonamide derivatives and Chocolate  Home Medications   Current Outpatient Rx  Name  Route  Sig  Dispense  Refill  . AMOXICILLIN 500 MG PO CAPS   Oral   Take 2,000 mg by mouth as needed. before dental work.         . ASPIRIN 325 MG PO TBEC   Oral   Take 1 tablet (325 mg total) by mouth 2 (two) times daily.   30 tablet   0   . BISOPROLOL FUMARATE 10 MG PO TABS   Oral   Take 10 mg  by mouth daily.         Marland Kitchen VITAMIN D 1000 UNITS PO TABS   Oral   Take 4,000 Units by mouth daily.         Marland Kitchen FEXOFENADINE HCL 180 MG PO TABS   Oral   Take 180 mg by mouth daily as needed. For seasonal allergies         . FLUTICASONE PROPIONATE 50 MCG/ACT NA SUSP   Nasal   Place 2 sprays into the nose daily.         Marland Kitchen GELATIN 600 MG PO CAPS   Oral   Take 600 mg by mouth 2 (two) times daily.         Marland Kitchen LOSARTAN POTASSIUM 100 MG PO TABS   Oral   Take 1 tablet (100 mg total) by mouth daily.   90 tablet   1   . METHOCARBAMOL 500 MG PO TABS   Oral   Take 1 tablet (500 mg total) by mouth every 6 (six) hours as needed.   60 tablet   0   . FISH OIL 1000 MG PO CAPS   Oral   Take 1,000 mg by mouth daily.          . OXYCODONE-ACETAMINOPHEN 5-325 MG PO TABS   Oral   Take 1-2 tablets by mouth every 4 (four) hours as needed for pain.   60 tablet   0   . SIMVASTATIN 10 MG PO TABS   Oral   Take 1 tablet (10 mg total) by mouth at bedtime.   90 tablet   3     BP 171/74  Pulse 71  Temp  98.5 F (36.9 C) (Oral)  Resp 19  Ht 5\' 7"  (1.702 m)  Wt 203 lb (92.08 kg)  BMI 31.79 kg/m2  SpO2 100%  LMP 07/11/1991  Physical Exam  Nursing note and vitals reviewed.  72 year old female, resting comfortably and in no acute distress. Vital signs are significant for hypertension with blood pressure 171/74. Oxygen saturation is 100%, which is normal. Head is normocephalic and atraumatic. PERRLA, EOMI. Oropharynx is clear. Neck is nontender and supple without adenopathy or JVD. Back is nontender and there is no CVA tenderness. Lungs are clear without rales, wheezes, or rhonchi. Chest is nontender. Heart has regular rate and rhythm without murmur. Abdomen is soft, flat, nontender without masses or hepatosplenomegaly and peristalsis is normoactive. Extremities: Right leg is shortened but not rotated. There is pain with passive range of motion of the right leg. Neurovascular exam is intact. There is 1+ edema bilaterally. Skin is warm and dry without rash. Neurologic: Mental status is normal, cranial nerves are intact, there are no motor or sensory deficits.  ED Course  Reduction of dislocation Date/Time: 07/24/2012 12:37 AM Performed by: Dione Booze Authorized by: Preston Fleeting, Josha Weekley Consent: Verbal consent obtained. Written consent obtained. Risks and benefits: risks, benefits and alternatives were discussed Consent given by: patient Patient understanding: patient states understanding of the procedure being performed Patient consent: the patient's understanding of the procedure matches consent given Procedure consent: procedure consent matches procedure scheduled Relevant documents: relevant documents present and verified Test results: test results available and properly labeled Site marked: the operative site was marked Imaging studies: imaging studies available Required items: required blood products, implants, devices, and special equipment available Patient identity confirmed:  verbally with patient and arm band Time out: Immediately prior to procedure a "time out" was called to verify the correct patient, procedure, equipment,  support staff and site/side marked as required. Local anesthesia used: no Patient sedated: yes Sedation type: moderate (conscious) sedation Sedatives: propofol Analgesia: fentanyl Sedation start date/time: 07/24/2012 12:15 AM Sedation end date/time: 07/24/2012 12:38 AM Vitals: Vital signs were monitored during sedation. Patient tolerance: Patient tolerated the procedure well with no immediate complications. Comments: Successful reduction by manipulation. Post reduction x-ray has been ordered.  Procedural sedation Performed by: JYNWG,NFAOZ Consent: Verbal consent obtained. Risks and benefits: risks, benefits and alternatives were discussed Required items: required blood products, implants, devices, and special equipment available Patient identity confirmed: arm band and provided demographic data Time out: Immediately prior to procedure a "time out" was called to verify the correct patient, procedure, equipment, support staff and site/side marked as required.  Sedation type: moderate (conscious) sedation NPO time confirmed and considedered  Sedatives: PROPOFOL  Physician Time at Bedside: 25 minutes  Vitals: Vital signs were monitored during sedation. Cardiac Monitor, pulse oximeter Patient tolerance: Patient tolerated the procedure well with no immediate complications. Comments: Pt with uneventful recovered. Returned to pre-procedural sedation baseline  Results for orders placed during the hospital encounter of 07/23/12  CBC WITH DIFFERENTIAL      Component Value Range   WBC 13.8 (*) 4.0 - 10.5 K/uL   RBC 3.99  3.87 - 5.11 MIL/uL   Hemoglobin 12.3  12.0 - 15.0 g/dL   HCT 30.8  65.7 - 84.6 %   MCV 91.5  78.0 - 100.0 fL   MCH 30.8  26.0 - 34.0 pg   MCHC 33.7  30.0 - 36.0 g/dL   RDW 96.2  95.2 - 84.1 %   Platelets 278  150 - 400  K/uL   Neutrophils Relative 75  43 - 77 %   Neutro Abs 10.3 (*) 1.7 - 7.7 K/uL   Lymphocytes Relative 17  12 - 46 %   Lymphs Abs 2.4  0.7 - 4.0 K/uL   Monocytes Relative 7  3 - 12 %   Monocytes Absolute 0.9  0.1 - 1.0 K/uL   Eosinophils Relative 1  0 - 5 %   Eosinophils Absolute 0.1  0.0 - 0.7 K/uL   Basophils Relative 0  0 - 1 %   Basophils Absolute 0.0  0.0 - 0.1 K/uL  BASIC METABOLIC PANEL      Component Value Range   Sodium 135  135 - 145 mEq/L   Potassium 3.2 (*) 3.5 - 5.1 mEq/L   Chloride 101  96 - 112 mEq/L   CO2 23  19 - 32 mEq/L   Glucose, Bld 125 (*) 70 - 99 mg/dL   BUN 15  6 - 23 mg/dL   Creatinine, Ser 3.24  0.50 - 1.10 mg/dL   Calcium 40.1  8.4 - 02.7 mg/dL   GFR calc non Af Amer 69 (*) >90 mL/min   GFR calc Af Amer 80 (*) >90 mL/min   Dg Hip Complete Right  07/23/2012  *RADIOLOGY REPORT*  Clinical Data: Pain  RIGHT HIP - COMPLETE 2+ VIEW  Comparison: 06/17/2012  Findings: Posterolateral dislocation of the femoral head prosthesis from the acetabular prosthesis.  Negative for fracture.  IMPRESSION:  1.  Posterolateral dislocation right hip prosthesis   Original Report Authenticated By: D. Andria Rhein, MD    Dg Hip Portable 1 View Right  07/24/2012  *RADIOLOGY REPORT*  Clinical Data: Postreduction  PORTABLE RIGHT HIP - 1 VIEW  Comparison: Earlier films of the same day  Findings: The femoral head component of hip arthroplasty projects in  the acetabular component on this single projection.  No fracture or other complication.  IMPRESSION:  1.  Reduction of the right hip arthroplasty.   Original Report Authenticated By: D. Andria Rhein, MD       1. Dislocation of hip prosthesis       MDM  Probable dislocation of the year right hip prosthesis. X-ray be obtained to confirm and she will need procedural sedation to try and reduce the dislocation.  X-ray confirms dislocation. Dislocated hip was reduced with the patient under moderate sedation.  Postreduction x-ray shows  successful reduction. Case is discussed with Dr. Yevette Edwards  Who requests that she be placed in knee immobilizer and followup in the office in the next several days.     Dione Booze, MD 07/24/12 [phone]

## 2012-07-23 NOTE — ED Notes (Signed)
Patient was doing her prescribed exercises for her hip replacement.  She was doing her sitting in the chair and marching and felt her right hip pop

## 2012-07-24 ENCOUNTER — Emergency Department (HOSPITAL_COMMUNITY): Payer: Medicare Other

## 2012-07-24 DIAGNOSIS — Z96649 Presence of unspecified artificial hip joint: Secondary | ICD-10-CM | POA: Diagnosis not present

## 2012-07-24 DIAGNOSIS — Z471 Aftercare following joint replacement surgery: Secondary | ICD-10-CM | POA: Diagnosis not present

## 2012-07-24 MED ORDER — PROPOFOL 10 MG/ML IV BOLUS
INTRAVENOUS | Status: AC | PRN
  Administered 2012-07-24: 46.05 mg via INTRAVENOUS

## 2012-07-24 NOTE — ED Notes (Signed)
MD Preston Fleeting reduce hip at this time. Pt tolerated procedure well. Plan of care is updated with verbal understanding. Pt has no more shortening in right leg with palpable pedal pulse palpated. Pt has iv infusing with no s/s of any infiltration. Will continue to closely monitor pt.

## 2012-07-24 NOTE — ED Notes (Signed)
Pt is alert and oriented x 4, vital signs are stable. MD remains at bedside for pt evaluation. Spouse at bedside, plan of care is updated and pt continues to verbalize understanding and will continue to monitor pt. Pt is still receiving fluids via IV at this time.

## 2012-07-24 NOTE — ED Notes (Signed)
Ortho at bedside for immobilizer placement.

## 2012-07-24 NOTE — Progress Notes (Signed)
Orthopedic Tech Progress Note Patient Details:  [name] October 13, 1940 [id number]  Ortho Devices Type of Ortho Device: Knee Immobilizer   Haskell Flirt 07/24/2012, 12:44 AM

## 2012-07-24 NOTE — ED Notes (Signed)
Radiology at bedside for post procedure testing. Ortho paged for immobilizer

## 2012-07-24 NOTE — ED Notes (Signed)
Pt denies any questions and reduced pain upon discharge

## 2012-07-25 ENCOUNTER — Emergency Department (HOSPITAL_COMMUNITY): Payer: Medicare Other

## 2012-07-25 ENCOUNTER — Observation Stay (HOSPITAL_COMMUNITY)
Admission: EM | Admit: 2012-07-25 | Discharge: 2012-07-26 | Disposition: A | Discharge status: Home / Self Care | Payer: Medicare Other | Attending: Orthopedic Surgery | Admitting: Orthopedic Surgery

## 2012-07-25 ENCOUNTER — Encounter (HOSPITAL_COMMUNITY)
Admission: EM | Disposition: A | Discharge status: Home / Self Care | Payer: Self-pay | Source: Home / Self Care | Attending: Orthopedic Surgery

## 2012-07-25 DIAGNOSIS — T84029A Dislocation of unspecified internal joint prosthesis, initial encounter: Principal | ICD-10-CM | POA: Insufficient documentation

## 2012-07-25 DIAGNOSIS — I10 Essential (primary) hypertension: Secondary | ICD-10-CM | POA: Insufficient documentation

## 2012-07-25 DIAGNOSIS — R269 Unspecified abnormalities of gait and mobility: Secondary | ICD-10-CM | POA: Diagnosis not present

## 2012-07-25 DIAGNOSIS — M25559 Pain in unspecified hip: Secondary | ICD-10-CM | POA: Diagnosis not present

## 2012-07-25 DIAGNOSIS — E669 Obesity, unspecified: Secondary | ICD-10-CM | POA: Insufficient documentation

## 2012-07-25 DIAGNOSIS — M25869 Other specified joint disorders, unspecified knee: Secondary | ICD-10-CM | POA: Diagnosis not present

## 2012-07-25 DIAGNOSIS — X500XXA Overexertion from strenuous movement or load, initial encounter: Secondary | ICD-10-CM | POA: Insufficient documentation

## 2012-07-25 DIAGNOSIS — R6889 Other general symptoms and signs: Secondary | ICD-10-CM | POA: Diagnosis not present

## 2012-07-25 DIAGNOSIS — M949 Disorder of cartilage, unspecified: Secondary | ICD-10-CM | POA: Diagnosis not present

## 2012-07-25 DIAGNOSIS — M899 Disorder of bone, unspecified: Secondary | ICD-10-CM | POA: Diagnosis not present

## 2012-07-25 DIAGNOSIS — M6281 Muscle weakness (generalized): Secondary | ICD-10-CM | POA: Diagnosis not present

## 2012-07-25 DIAGNOSIS — Z9010 Acquired absence of unspecified breast and nipple: Secondary | ICD-10-CM | POA: Diagnosis not present

## 2012-07-25 DIAGNOSIS — Z471 Aftercare following joint replacement surgery: Secondary | ICD-10-CM | POA: Diagnosis not present

## 2012-07-25 DIAGNOSIS — Z96649 Presence of unspecified artificial hip joint: Secondary | ICD-10-CM | POA: Diagnosis not present

## 2012-07-25 DIAGNOSIS — S73006A Unspecified dislocation of unspecified hip, initial encounter: Secondary | ICD-10-CM | POA: Diagnosis not present

## 2012-07-25 DIAGNOSIS — Z853 Personal history of malignant neoplasm of breast: Secondary | ICD-10-CM | POA: Diagnosis not present

## 2012-07-25 SURGERY — CLOSED MANIPULATION, JOINT, HIP
Anesthesia: General | Wound class: Clean

## 2012-07-25 MED ORDER — FENTANYL CITRATE 0.05 MG/ML IJ SOLN
INTRAMUSCULAR | Status: AC
  Filled 2012-07-25: qty 2

## 2012-07-25 MED ORDER — PROPOFOL 10 MG/ML IV BOLUS
INTRAVENOUS | Status: AC | PRN
  Administered 2012-07-25: 25 mg via INTRAVENOUS

## 2012-07-25 MED ORDER — FENTANYL CITRATE 0.05 MG/ML IJ SOLN
50.0000 ug | Freq: Once | INTRAMUSCULAR | Status: AC
  Administered 2012-07-25: 50 ug via INTRAVENOUS
  Filled 2012-07-25: qty 2

## 2012-07-25 MED ORDER — FENTANYL CITRATE 0.05 MG/ML IJ SOLN
INTRAMUSCULAR | Status: AC | PRN
  Administered 2012-07-25: 50 ug via INTRAVENOUS

## 2012-07-25 MED ORDER — PROPOFOL 10 MG/ML IV BOLUS
0.5000 mg/kg | Freq: Once | INTRAVENOUS | Status: AC
  Administered 2012-07-25: 46 mg via INTRAVENOUS
  Filled 2012-07-25: qty 1

## 2012-07-25 NOTE — ED Notes (Signed)
Spoke with Dr Ave Filter, informed him OR will not accept patient until he has seen her to sign consent, Dr Ave Filter states he will come see her upon his arrival. Updated Beth, RN in the OR.

## 2012-07-25 NOTE — ED Notes (Signed)
Pt BIB EMS. Pt from home. Pt told EMS she was sitting in her chair and turned wrong and possibly dislocated R hip. Pt had R hip replaced in Dec. 2013. Pt states she dislocated the same hip last week. Pt has swelling to R hip per EMS. No deformity noted. Pt was given Fentanyl per EMS PTA.

## 2012-07-25 NOTE — ED Notes (Signed)
MD at bedside to discuss disposition with the patient and family

## 2012-07-25 NOTE — ED Notes (Signed)
Pt preparing for R hip reduction with conscious sedation. Consent signed by pt.

## 2012-07-25 NOTE — ED Provider Notes (Signed)
History     CSN: [id number]  Arrival date & time 07/25/12  2059   First MD Initiated Contact with Patient 07/25/12 2135      Chief Complaint  Patient presents with  . Hip Injury    (Consider location/radiation/quality/duration/timing/severity/associated sxs/prior treatment) The history is provided by the patient.   72 year old female with a right hip prosthesis had dislocated her hip 2 days ago and had been sent home in a knee immobilizer after reduction. She saw her orthopedic Dr. today who had talked with her about getting a molded brace to help keep the prosthesis in place. Tonight, and tender, she was reaching for something at the dinner table and when the right hip popped out of place again. She is complaining of moderate pain which she rates at 6/10.  Past Medical History  Diagnosis Date  . Hypertension   . Obesity   . Allergic rhinitis   . 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 Osteopenia 11/26/2009  . Breast cancer     "right" (06/17/2012)  . Exertional dyspnea   . Thyroid disorder     "sees endocrinologist yearly" (06/17/2012)  . Iron deficiency anemia     "hematologist watches it" (06/17/2012)  . Osteoarthritis     severe right hip osteoarthritis-s/p hip replacement  . Arthritis     "knuckles" (06/17/2012)    Past Surgical History  Procedure Date  . Total hip arthroplasty 04/2006    right hip   . Mastectomy 1993?    bilateral mastectomy  . Tonsillectomy 1949  . Eye surgery 2011    cataract removal both eyes  . Total hip revision 06/17/2012    "right" (06/17/2012)  . Breast biopsy ` 1992    "bilaterlly" (06/17/2012)  . Total hip revision 06/17/2012    Procedure: TOTAL HIP REVISION;  Surgeon: [name], MD;  Location: [facility] OR;  Service: Orthopedics;  Laterality: Right;    Family History  Problem Relation Age of Onset  . Heart failure Father     deceased age 77  . Diabetes Father   . Alcohol abuse Father   . Breast cancer Mother     deceased at age 69  secondary to breast cancer    History  Substance Use Topics  . Smoking status: Former Smoker -- 0.5 packs/day for 10 years    Types: Cigarettes    Quit date: 07/14/1974  . Smokeless tobacco: Never Used     Comment: quit in 1976 smoked for approx. 10 years  . Alcohol Use: 4.2 oz/week    7 Glasses of wine per week     Comment: drinks wine with dinner    OB History    Grav Para Term Preterm Abortions TAB SAB Ect Mult Living                  Review of Systems  All other systems reviewed and are negative.    Allergies  Sulfonamide derivatives and Chocolate  Home Medications   Current Outpatient Rx  Name  Route  Sig  Dispense  Refill  . BISOPROLOL FUMARATE 10 MG PO TABS   Oral   Take 10 mg by mouth daily.         Marland Kitchen VITAMIN D 2000 UNITS PO CAPS   Oral   Take 4,000 Units by mouth daily.         Marland Kitchen FEXOFENADINE HCL 180 MG PO TABS   Oral   Take 180 mg by mouth daily as needed. For seasonal  allergies         . FLUTICASONE PROPIONATE 50 MCG/ACT NA SUSP   Nasal   Place 1 spray into the nose daily.          Marland Kitchen LOSARTAN POTASSIUM 100 MG PO TABS   Oral   Take 1 tablet (100 mg total) by mouth daily.   90 tablet   1   . METHOCARBAMOL 500 MG PO TABS   Oral   Take 500 mg by mouth every 6 (six) hours as needed. For muscle spasms         . FISH OIL 1000 MG PO CAPS   Oral   Take 1,000 mg by mouth daily.         . OXYCODONE-ACETAMINOPHEN 5-325 MG PO TABS   Oral   Take 1-2 tablets by mouth daily as needed. For pain         . SIMVASTATIN 10 MG PO TABS   Oral   Take 1 tablet (10 mg total) by mouth at bedtime.   90 tablet   3   . AMOXICILLIN 500 MG PO CAPS   Oral   Take 2,000 mg by mouth as needed. before dental work.         Marland Kitchen GELATIN PO   Oral   Take 2,160 mg by mouth daily.           BP 124/83  Pulse 72  Temp 98.6 F (37 C) (Oral)  Resp 18  SpO2 100%  LMP 07/11/1991  Physical Exam  Nursing note and vitals reviewed.  72 year old  female, resting comfortably and in no acute distress. Vital signs are normal. Oxygen saturation is 100%, which is normal. Head is normocephalic and atraumatic. PERRLA, EOMI. Oropharynx is clear. Neck is nontender and supple without adenopathy or JVD. Back is nontender and there is no CVA tenderness. Lungs are clear without rales, wheezes, or rhonchi. Chest is nontender. Heart has regular rate and rhythm without murmur. Abdomen is soft, flat, nontender without masses or hepatosplenomegaly and peristalsis is normoactive. Extremities: Right leg is slightly shortened and externally rotated. There is pain with passive range of motion. Knee immobilizer is in place. No other extremity injuries seen. Skin is warm and dry without rash. Neurologic: Mental status is normal, cranial nerves are intact, there are no motor or sensory deficits.  ED Course  Reduction of dislocation Date/Time: 07/25/2012 11:13 PM Performed by: Dione Booze Authorized by: Preston Fleeting, Curry Seefeldt Consent: Verbal consent obtained. Written consent obtained. Risks and benefits: risks, benefits and alternatives were discussed Consent given by: patient Patient understanding: patient states understanding of the procedure being performed Patient consent: the patient's understanding of the procedure matches consent given Procedure consent: procedure consent matches procedure scheduled Relevant documents: relevant documents present and verified Test results: test results available and properly labeled Site marked: the operative site was marked Imaging studies: imaging studies available Required items: required blood products, implants, devices, and special equipment available Patient identity confirmed: verbally with patient and arm band Time out: Immediately prior to procedure a "time out" was called to verify the correct patient, procedure, equipment, support staff and site/side marked as required. Local anesthesia used: no Patient sedated:  yes Sedatives: propofol Analgesia: fentanyl Sedation start date/time: 07/25/2012 11:45 PM Sedation end date/time: 07/25/2012 11:13 PM Vitals: Vital signs were monitored during sedation. Patient tolerance: Patient tolerated the procedure well with no immediate complications. Comments: Attempt to reduce right hip prosthesis dislocation by manipulation was unsuccessful.  Procedural sedation Performed by:  Raynisha Avilla Consent: Verbal consent obtained. Risks and benefits: risks, benefits and alternatives were discussed Required items: required blood products, implants, devices, and special equipment available Patient identity confirmed: arm band and provided demographic data Time out: Immediately prior to procedure a "time out" was called to verify the correct patient, procedure, equipment, support staff and site/side marked as required.  Sedation type: moderate (conscious) sedation NPO time confirmed and considedered  Sedatives: PROPOFOL  Physician Time at Bedside: 25 minutes  Vitals: Vital signs were monitored during sedation. Cardiac Monitor, pulse oximeter Patient tolerance: Patient tolerated the procedure well with no immediate complications. Comments: Pt with uneventful recovered. Returned to pre-procedural sedation baseline     (including critical care time) Dg Hip Complete Right  07/25/2012  *RADIOLOGY REPORT*  Clinical Data: Recent hip revision, popped out.  RIGHT HIP - COMPLETE 2+ VIEW  Comparison:   the previous day's study  Findings: New   superior dislocation of the femoral component from the acetabular component.  Negative for fracture or other acute bony abnormality.  IMPRESSION:  1.  Dislocation of the right hip arthroplasty   Original Report Authenticated By: D. Andria Rhein, MD    Images viewed by me.   Date: 07/25/2012  Rate: 72  Rhythm: normal sinus rhythm  QRS Axis: normal  Intervals: normal  ST/T Wave abnormalities: nonspecific ST changes  Conduction  Disutrbances:none  Narrative Interpretation: Minor nonspecific ST-T changes. When compared with ECG of 06/10/2012, no significant changes are seen.  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 Attempts to reduce in the ED were unsuccessful. Case was discussed with Dr. Ave Filter who is on call for her orthopedic surgeon Dr. Turner Daniels And she will be taken to the operating room for reduction. Laboratory workup had been done at her previous ED visit 2 days ago so is not repeated. ECG is obtained in preparation for her OR.        Dione Booze, MD 07/26/12 [phone]

## 2012-07-25 NOTE — ED Notes (Signed)
[id number]<ZH> Expected date:<BR> Expected time:<BR> Means of arrival:<BR> Comments:<BR> [facility]-recent hip surgery-possible dislocation-Fentanyl IV

## 2012-07-25 NOTE — ED Notes (Signed)
Patient transported to X-ray 

## 2012-07-26 ENCOUNTER — Encounter (HOSPITAL_COMMUNITY): Payer: Self-pay | Admitting: *Deleted

## 2012-07-26 ENCOUNTER — Encounter (HOSPITAL_COMMUNITY): Payer: Self-pay | Admitting: Anesthesiology

## 2012-07-26 ENCOUNTER — Emergency Department (HOSPITAL_COMMUNITY): Payer: Medicare Other

## 2012-07-26 ENCOUNTER — Encounter (HOSPITAL_COMMUNITY)
Admission: EM | Disposition: A | Discharge status: Home / Self Care | Payer: Self-pay | Source: Home / Self Care | Attending: Orthopedic Surgery

## 2012-07-26 ENCOUNTER — Emergency Department (HOSPITAL_COMMUNITY): Payer: Medicare Other | Admitting: Anesthesiology

## 2012-07-26 DIAGNOSIS — E669 Obesity, unspecified: Secondary | ICD-10-CM | POA: Diagnosis not present

## 2012-07-26 DIAGNOSIS — I10 Essential (primary) hypertension: Secondary | ICD-10-CM | POA: Diagnosis not present

## 2012-07-26 DIAGNOSIS — Z96649 Presence of unspecified artificial hip joint: Secondary | ICD-10-CM | POA: Diagnosis not present

## 2012-07-26 DIAGNOSIS — M25869 Other specified joint disorders, unspecified knee: Secondary | ICD-10-CM | POA: Diagnosis not present

## 2012-07-26 DIAGNOSIS — T84029A Dislocation of unspecified internal joint prosthesis, initial encounter: Secondary | ICD-10-CM | POA: Diagnosis not present

## 2012-07-26 DIAGNOSIS — C50919 Malignant neoplasm of unspecified site of unspecified female breast: Secondary | ICD-10-CM | POA: Diagnosis not present

## 2012-07-26 HISTORY — PX: HIP CLOSED REDUCTION: SHX983

## 2012-07-26 SURGERY — CLOSED MANIPULATION, JOINT, HIP
Anesthesia: General | Site: Hip | Laterality: Right | Wound class: Clean

## 2012-07-26 MED ORDER — LACTATED RINGERS IV SOLN
INTRAVENOUS | Status: DC | PRN
  Administered 2012-07-26: 01:00:00 via INTRAVENOUS

## 2012-07-26 MED ORDER — LACTATED RINGERS IV SOLN: INTRAVENOUS | Status: DC

## 2012-07-26 MED ORDER — FENTANYL CITRATE 0.05 MG/ML IJ SOLN
INTRAMUSCULAR | Status: DC | PRN
  Administered 2012-07-26 (×2): 50 ug via INTRAVENOUS

## 2012-07-26 MED ORDER — PROPOFOL 10 MG/ML IV BOLUS
INTRAVENOUS | Status: DC | PRN
  Administered 2012-07-26: 180 mg via INTRAVENOUS

## 2012-07-26 MED ORDER — ONDANSETRON HCL 4 MG PO TABS: 4.0000 mg | ORAL_TABLET | Freq: Four times a day (QID) | ORAL | Status: DC | PRN

## 2012-07-26 MED ORDER — METOCLOPRAMIDE HCL 10 MG PO TABS: 5.0000 mg | ORAL_TABLET | Freq: Three times a day (TID) | ORAL | Status: DC | PRN

## 2012-07-26 MED ORDER — HYDROCODONE-ACETAMINOPHEN 5-325 MG PO TABS
1.0000 | ORAL_TABLET | ORAL | Status: DC | PRN
  Administered 2012-07-26: 1 via ORAL
  Filled 2012-07-26: qty 1

## 2012-07-26 MED ORDER — FENTANYL CITRATE 0.05 MG/ML IJ SOLN: 25.0000 ug | INTRAMUSCULAR | Status: DC | PRN

## 2012-07-26 MED ORDER — DEXTROSE 5 % IV SOLN
500.0000 mg | Freq: Four times a day (QID) | INTRAVENOUS | Status: DC | PRN
  Filled 2012-07-26: qty 5

## 2012-07-26 MED ORDER — METHOCARBAMOL 500 MG PO TABS
500.0000 mg | ORAL_TABLET | Freq: Four times a day (QID) | ORAL | Status: DC | PRN
  Administered 2012-07-26: 500 mg via ORAL

## 2012-07-26 MED ORDER — DIPHENHYDRAMINE HCL 12.5 MG/5ML PO ELIX: 12.5000 mg | ORAL_SOLUTION | ORAL | Status: DC | PRN

## 2012-07-26 MED ORDER — ONDANSETRON HCL 4 MG/2ML IJ SOLN: 4.0000 mg | Freq: Four times a day (QID) | INTRAMUSCULAR | Status: DC | PRN

## 2012-07-26 MED ORDER — MIDAZOLAM HCL 5 MG/5ML IJ SOLN
INTRAMUSCULAR | Status: DC | PRN
  Administered 2012-07-26: 2 mg via INTRAVENOUS

## 2012-07-26 MED ORDER — METOCLOPRAMIDE HCL 5 MG/ML IJ SOLN: 5.0000 mg | Freq: Three times a day (TID) | INTRAMUSCULAR | Status: DC | PRN

## 2012-07-26 SURGICAL SUPPLY — 1 items: PILLOW ABDUCTION HIP (SOFTGOODS) ×1 IMPLANT

## 2012-07-26 NOTE — Evaluation (Signed)
Physical Therapy Evaluation Patient Details Name: [name] MRN: [id number] DOB: 08/09/1940 Today's Date: 07/26/2012 Time: 4696-2952 PT Time Calculation (min): 22 min  PT Assessment / Plan / Recommendation Clinical Impression  pt s/p closed reduction right hip and will benefit from HHPT at D/C    PT Assessment  All further PT needs can be met in the next venue of care    Follow Up Recommendations  Home health PT, assist/supervision prn     Does the patient have the potential to tolerate intense rehabilitation      Barriers to Discharge        Equipment Recommendations  None recommended by PT    Recommendations for Other Services     Frequency      Precautions / Restrictions Precautions Precautions: Posterior Hip Precaution Comments: reviewed THP with husb and pt Required Braces or Orthoses: Other Brace/Splint (at all times) Other Brace/Splint: hip abduction brace   Pertinent Vitals/Pain       Mobility  Bed Mobility Bed Mobility: Supine to Sit Supine to Sit: 5: Supervision Details for Bed Mobility Assistance: cues for technique and hip precautions, also reviewed bed ht, safety etc Transfers Transfers: Sit to Stand;Stand to Sit Sit to Stand: 5: Supervision;6: Modified independent (Device/Increase time) Stand to Sit: 5: Supervision;6: Modified independent (Device/Increase time) Details for Transfer Assistance: occasional cues for RLE positon, husband I with cues for pt Ambulation/Gait Ambulation/Gait Assistance: 6: Modified independent (Device/Increase time) Ambulation Distance (Feet): 120 Feet Assistive device: Rolling walker Ambulation/Gait Assistance Details: pt feels comfortable amb with brace,  Gait Pattern: Step-through pattern    Shoulder Instructions     Exercises     PT Diagnosis:    PT Problem List:   PT Treatment Interventions:     PT Goals    Visit Information  Last PT Received On: 07/26/12 Assistance Needed: +1    Subjective  Data  Subjective: i want towork with you Patient Stated Goal: home   Prior Functioning  Home Living Lives With: Spouse Available Help at Discharge: Family Type of Home: House Home Access: Stairs to enter Secretary/administrator of Steps: 2 Entrance Stairs-Rails: Right Home Layout: One level Home Adaptive Equipment: Walker - rolling;Bedside commode/3-in-1 Prior Function Level of Independence: Independent with assistive device(s) Comments: was onlast HHPT visit prior to dislocation Communication Communication: No difficulties    Cognition  Overall Cognitive Status: Appears within functional limits for tasks assessed/performed Arousal/Alertness: Awake/alert Orientation Level: Appears intact for tasks assessed Behavior During Session: [facility] for tasks performed    Extremity/Trunk Assessment Right Upper Extremity Assessment RUE ROM/Strength/Tone: [employer] for tasks assessed Left Upper Extremity Assessment LUE ROM/Strength/Tone: WFL for tasks assessed Right Lower Extremity Assessment RLE ROM/Strength/Tone: Deficits;Due to precautions RLE ROM/Strength/Tone Deficits: hip abduction brace Left Lower Extremity Assessment LLE ROM/Strength/Tone: WFL for tasks assessed   Balance Static Standing Balance Static Standing - Balance Support: No upper extremity supported;During functional activity (dofing and donning brace in standing, husb educated) Static Standing - Level of Assistance: 5: Stand by assistance  End of Session PT - End of Session Activity Tolerance: Patient tolerated treatment well Patient left: in chair;with call bell/phone within reach;with nursing in room;with family/visitor present  GP Functional Assessment Tool Used: clinical judgement Functional Limitation: Mobility: Walking and moving around Mobility: Walking and Moving Around Current Status ([id number]): At least 1 percent but less than 20 percent impaired, limited or restricted Mobility: Walking and Moving Around Goal Status  [phone]): At least 1 percent but less than 20 percent impaired, limited or  restricted Mobility: Walking and Moving Around Discharge Status [phone]): At least 1 percent but less than 20 percent impaired, limited or restricted   [facility] 07/26/2012, 1:06 PM

## 2012-07-26 NOTE — Transfer of Care (Signed)
Immediate Anesthesia Transfer of Care Note  Patient: [name]  Procedure(s) Performed: Procedure(s) (LRB) with comments: CLOSED MANIPULATION HIP (Right)  Patient Location: PACU  Anesthesia Type:General  Level of Consciousness: awake, alert  and oriented  Airway & Oxygen Therapy: Patient Spontanous Breathing and Patient connected to nasal cannula oxygen  Post-op Assessment: Report given to PACU RN and Post -op Vital signs reviewed and stable  Post vital signs: Reviewed and stable  Complications: No apparent anesthesia complications

## 2012-07-26 NOTE — Anesthesia Postprocedure Evaluation (Signed)
  Anesthesia Post-op Note  Patient: [name]  Procedure(s) Performed: Procedure(s) (LRB): CLOSED MANIPULATION HIP (Right)  Patient Location: PACU  Anesthesia Type: General  Level of Consciousness: awake and alert   Airway and Oxygen Therapy: Patient Spontanous Breathing  Post-op Pain: mild  Post-op Assessment: Post-op Vital signs reviewed, Patient's Cardiovascular Status Stable, Respiratory Function Stable, Patent Airway and No signs of Nausea or vomiting  Last Vitals:  Filed Vitals:   07/26/12 0025  BP: 177/80  Pulse: 80  Temp:   Resp: 18    Post-op Vital Signs: stable   Complications: No apparent anesthesia complications

## 2012-07-26 NOTE — Progress Notes (Signed)
PATIENT ID: [name]  MRN: [id number]  DOB/AGE:  Sep 06, 1940 / 72 y.o.  Day of Surgery Procedure(s) (LRB): CLOSED MANIPULATION HIP (Right)    PROGRESS NOTE Subjective: Patient is alert, oriented,no Nausea, no Vomiting.  She dislocated her hip last night at the dinner table.  Dr. Ave Filter did a closed reduction last night.  Patient reports pain as mild  .    Objective: Vital signs in last 24 hours: Filed Vitals:   07/26/12 0210 07/26/12 0310 07/26/12 0410 07/26/12 0534  BP: 159/79 136/75 126/70 123/69  Pulse: 69 66 64 63  Temp: 97.8 F (36.6 C) 97.9 F (36.6 C) 97.9 F (36.6 C) 97.8 F (36.6 C)  TempSrc:  Oral Oral Oral  Resp: 16 16 16 16   Height:      Weight:      SpO2: 100% 100% 100% 99%      Intake/Output from previous day: I/O last 3 completed shifts: In: 810 [P.O.:360; I.V.:450] Out: 350 [Urine:350]   Intake/Output this shift:     LABORATORY DATA:  Basename 07/23/12 2303  WBC 13.8*  HGB 12.3  HCT 36.5  PLT 278  NA 135  K 3.2*  CL 101  CO2 23  BUN 15  CREATININE 0.84  GLUCOSE 125*  GLUCAP --  INR --  CALCIUM 10.4    Examination: Neurologically intact ABD soft Neurovascular intact Sensation intact distally Intact pulses distally Dorsiflexion/Plantar flexion intact Incision: dressing C/D/I and no drainage} XR AP&Lat of hip shows well placed\fixed THA  Assessment:   Day of Surgery Procedure(s) (LRB): CLOSED MANIPULATION HIP (Right) ADDITIONAL DIAGNOSIS:  none  Plan: PT/OT WBAT, strict THA  posterior precautions  DISCHARGE PLAN: Home today after 1 visit with PT.  DISCHARGE NEEDS: [employer] abduction brace

## 2012-07-26 NOTE — Discharge Summary (Signed)
Patient ID: [name] MRN: [id number] DOB/AGE: [birth year] 72 y.o.  Admit date: 07/25/2012 Discharge date: 07/26/2012  Admission Diagnoses:  Principal Problem:  *Dislocation of hip prosthesis (right hip)  Discharge Diagnoses:  Same  Past Medical History  Diagnosis Date  . Hypertension   . Obesity   . Allergic rhinitis   . 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 Osteopenia 11/26/2009  . Breast cancer     "right" (06/17/2012)  . Exertional dyspnea   . Thyroid disorder     "sees endocrinologist yearly" (06/17/2012)  . Iron deficiency anemia     "hematologist watches it" (06/17/2012)  . Osteoarthritis     severe right hip osteoarthritis-s/p hip replacement  . Arthritis     "knuckles" (06/17/2012)    Surgeries: Procedure(s): CLOSED MANIPULATION HIP on 07/25/2012 - 07/26/2012   Consultants:    Discharged Condition: Improved  Hospital Course: [name] is an 72 y.o. female who was admitted 07/25/2012 for operative treatment ofDislocation of hip prosthesis. Patient has severe unremitting pain that affects sleep, daily activities, and work/hobbies. After pre-op clearance the patient was taken to the operating room on 07/25/2012 - 07/26/2012 and underwent  Procedure(s): CLOSED MANIPULATION HIP.    Patient was given perioperative antibiotics: Anti-infectives    None       Patient was given sequential compression devices, early ambulation, and chemoprophylaxis to prevent DVT.  Patient benefited maximally from hospital stay and there were no complications.    Recent vital signs: Patient Vitals for the past 24 hrs:  BP Temp Temp src Pulse Resp SpO2 Height Weight  07/26/12 0534 123/69 mmHg 97.8 F (36.6 C) Oral 63  16  99 % - -  07/26/12 0410 126/70 mmHg 97.9 F (36.6 C) Oral 64  16  100 % - -  07/26/12 0310 136/75 mmHg 97.9 F (36.6 C) Oral 66  16  100 % - -  07/26/12 0210 159/79 mmHg 97.8 F (36.6 C) - 69  16  100 % - -  07/26/12 0145 147/81 mmHg 97.9 F (36.6  C) - 67  14  100 % - -  07/26/12 0130 154/74 mmHg - - 72  15  100 % - -  07/26/12 0117 - 98 F (36.7 C) - - - - - -  07/26/12 0025 177/80 mmHg - - 80  18  100 % - -  07/26/12 0020 - - - 78  20  100 % - -  07/25/12 2345 144/65 mmHg - - 67  16  100 % - -  07/25/12 2330 152/69 mmHg - - 70  17  100 % - -  07/25/12 2315 158/78 mmHg - - 71  17  100 % - -  07/25/12 2310 157/76 mmHg - - 72  20  100 % - -  07/25/12 2306 154/67 mmHg - - 74  20  100 % - -  07/25/12 2305 154/67 mmHg - - 71  17  98 % - -  07/25/12 2301 152/68 mmHg - - 73  28  100 % - -  07/25/12 2300 152/68 mmHg - - 75  21  100 % - -  07/25/12 2258 158/99 mmHg - - 76  24  100 % - -  07/25/12 2251 165/70 mmHg - - 74  20  100 % - -  07/25/12 2247 143/74 mmHg - - 71  16  100 % - -  07/25/12 2212 - - - - - - 5\' 7"  (1.702 m) 92.08 kg (203  lb)  07/25/12 2106 124/83 mmHg 98.6 F (37 C) Oral 72  18  100 % - -     Recent laboratory studies:  [facility] 07/23/12 2303  WBC 13.8*  HGB 12.3  HCT 36.5  PLT 278  NA 135  K 3.2*  CL 101  CO2 23  BUN 15  CREATININE 0.84  GLUCOSE 125*  INR --  CALCIUM 10.4     Discharge Medications:     Medication List     As of 07/26/2012  7:38 AM    STOP taking these medications         methocarbamol 500 MG tablet   Commonly known as: ROBAXIN      TAKE these medications         amoxicillin 500 MG capsule   Commonly known as: AMOXIL   Take 2,000 mg by mouth as needed. before dental work.      bisoprolol 10 MG tablet   Commonly known as: ZEBETA   Take 10 mg by mouth daily.      fexofenadine 180 MG tablet   Commonly known as: ALLEGRA   Take 180 mg by mouth daily as needed. For seasonal allergies      Fish Oil 1000 MG Caps   Take 1,000 mg by mouth daily.      fluticasone 50 MCG/ACT nasal spray   Commonly known as: FLONASE   Place 1 spray into the nose daily.      GELATIN PO   Take 2,160 mg by mouth daily.      losartan 100 MG tablet   Commonly known as: COZAAR   Take 1  tablet (100 mg total) by mouth daily.      oxyCODONE-acetaminophen 5-325 MG per tablet   Commonly known as: PERCOCET/ROXICET   Take 1-2 tablets by mouth daily as needed. For pain      simvastatin 10 MG tablet   Commonly known as: ZOCOR   Take 1 tablet (10 mg total) by mouth at bedtime.      Vitamin D 2000 UNITS Caps   Take 4,000 Units by mouth daily.        Diagnostic Studies: Dg Hip 1 View Right  07/26/2012  *RADIOLOGY REPORT*  Clinical Data: Intraoperative closed reduction of right hip.  RIGHT HIP - 1 VIEW,DG C-ARM 1-60 MIN - NRPT [facility]  Comparison: Right hip 07/25/2012.  Findings: A single spot fluoroscopic view of the right hip is obtained intraoperatively for surgical control purposes.  The image demonstrates relocation of the right hip prosthesis since the previous study.  No fractures identified within the field of view.  IMPRESSION: Intraoperative fluoroscopy of the right hip is obtained for surgical control purposes demonstrating reduction of previous dislocation of the right hip prosthesis.   Original Report Authenticated By: Burman Nieves, M.D.    Dg Hip Complete Right  07/25/2012  *RADIOLOGY REPORT*  Clinical Data: Recent hip revision, popped out.  RIGHT HIP - COMPLETE 2+ VIEW  Comparison:   the previous day's study  Findings: New   superior dislocation of the femoral component from the acetabular component.  Negative for fracture or other acute bony abnormality.  IMPRESSION:  1.  Dislocation of the right hip arthroplasty   Original Report Authenticated By: D. Andria Rhein, MD    Dg Hip Complete Right  07/23/2012  *RADIOLOGY REPORT*  Clinical Data: Pain  RIGHT HIP - COMPLETE 2+ VIEW  Comparison: 06/17/2012  Findings: Posterolateral dislocation of the femoral head prosthesis from the acetabular  prosthesis.  Negative for fracture.  IMPRESSION:  1.  Posterolateral dislocation right hip prosthesis   Original Report Authenticated By: D. Andria Rhein, MD    Dg Hip Portable 1 View  Right  07/24/2012  *RADIOLOGY REPORT*  Clinical Data: Postreduction  PORTABLE RIGHT HIP - 1 VIEW  Comparison: Earlier films of the same day  Findings: The femoral head component of hip arthroplasty projects in the acetabular component on this single projection.  No fracture or other complication.  IMPRESSION:  1.  Reduction of the right hip arthroplasty.   Original Report Authenticated By: D. Andria Rhein, MD    Dg C-arm 1-60 Min-no Report  07/26/2012  *RADIOLOGY REPORT*  Clinical Data: Intraoperative closed reduction of right hip.  RIGHT HIP - 1 VIEW,DG C-ARM 1-60 MIN - NRPT [facility]  Comparison: Right hip 07/25/2012.  Findings: A single spot fluoroscopic view of the right hip is obtained intraoperatively for surgical control purposes.  The image demonstrates relocation of the right hip prosthesis since the previous study.  No fractures identified within the field of view.  IMPRESSION: Intraoperative fluoroscopy of the right hip is obtained for surgical control purposes demonstrating reduction of previous dislocation of the right hip prosthesis.   Original Report Authenticated By: Burman Nieves, M.D.     Disposition: 01-Home or Self Care      Discharge Orders    Future Appointments: Provider: Department: Dept Phone: Center:   10/14/2012 10:30 AM Sandford Craze, NP [facility] at  [facility] [phone] [facility]     Future Orders Please Complete By Expires   Increase activity slowly      [facility]       May shower / Bathe      Change dressing (specify)      Comments:   Dressing change as needed.   Call MD for:  temperature >100.4      Call MD for:  severe uncontrolled pain      Call MD for:  redness, tenderness, or signs of infection (pain, swelling, redness, odor or green/yellow discharge around incision site)      Discharge instructions      Comments:   Abduction brace at all times.         SignedHazle Nordmann. 07/26/2012, 7:38 AM

## 2012-07-26 NOTE — H&P (Signed)
[name] is an 72 y.o. female.   Chief Complaint: Right hip dislocation HPI: The patient is a 72 year old female who had a revision right total hip replacement approximately one month ago. 2 nights ago she had a dislocation that was reduced in the emergency department. She was placed in the immobilizer. Unfortunately this evening she was sitting at the dinner table and turned and felt her hip dislocation. She was wearing a knee immobilizer at the time. An attempt was made a reduction in the emergency department with conscious sedation. It was unsuccessful.  Past Medical History  Diagnosis Date  . Hypertension   . Obesity   . Allergic rhinitis   . 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 Osteopenia 11/26/2009  . Breast cancer     "right" (06/17/2012)  . Exertional dyspnea   . Thyroid disorder     "sees endocrinologist yearly" (06/17/2012)  . Iron deficiency anemia     "hematologist watches it" (06/17/2012)  . Osteoarthritis     severe right hip osteoarthritis-s/p hip replacement  . Arthritis     "knuckles" (06/17/2012)    Past Surgical History  Procedure Date  . Total hip arthroplasty 04/2006    right hip   . Mastectomy 1993?    bilateral mastectomy  . Tonsillectomy 1949  . Eye surgery 2011    cataract removal both eyes  . Total hip revision 06/17/2012    "right" (06/17/2012)  . Breast biopsy ` 1992    "bilaterlly" (06/17/2012)  . Total hip revision 06/17/2012    Procedure: TOTAL HIP REVISION;  Surgeon: [name], MD;  Location: [facility];  Service: Orthopedics;  Laterality: Right;    Family History  Problem Relation Age of Onset  . Heart failure Father     deceased age 4  . Diabetes Father   . Alcohol abuse Father   . Breast cancer Mother     deceased at age 55 secondary to breast cancer   Social History:  reports that she quit smoking about 38 years ago. Her smoking use included Cigarettes. She has a 5 pack-year smoking history. She has never used smokeless  tobacco. She reports that she drinks about 4.2 ounces of alcohol per week. She reports that she does not use illicit drugs.  Allergies:  Allergies  Allergen Reactions  . Sulfonamide Derivatives Swelling    REACTION: Swelling of the face; "eyes swelled shut"  . Chocolate Other (See Comments)    Sinus problems     (Not in a hospital admission)  No results found for this or any previous visit (from the past 48 hour(s)). Dg Hip Complete Right  07/25/2012  *RADIOLOGY REPORT*  Clinical Data: Recent hip revision, popped out.  RIGHT HIP - COMPLETE 2+ VIEW  Comparison:   the previous day's study  Findings: New   superior dislocation of the femoral component from the acetabular component.  Negative for fracture or other acute bony abnormality.  IMPRESSION:  1.  Dislocation of the right hip arthroplasty   Original Report Authenticated By: D. Andria Rhein, MD    Dg Hip Portable 1 View Right  07/24/2012  *RADIOLOGY REPORT*  Clinical Data: Postreduction  PORTABLE RIGHT HIP - 1 VIEW  Comparison: Earlier films of the same day  Findings: The femoral head component of hip arthroplasty projects in the acetabular component on this single projection.  No fracture or other complication.  IMPRESSION:  1.  Reduction of the right hip arthroplasty.   Original Report Authenticated By: D. Deanne Coffer  III, MD     Review of Systems  All other systems reviewed and are negative.    Blood pressure 144/65, pulse 67, temperature 98.6 F (37 C), temperature source Oral, resp. rate 16, height 5\' 7"  (1.702 m), weight 92.08 kg (203 lb), last menstrual period 07/11/1991, SpO2 100.00%. Physical Exam  Constitutional: She is oriented to person, place, and time. She appears well-developed and well-nourished.  HENT:  Head: Atraumatic.  Eyes: EOM are normal.  Cardiovascular: Intact distal pulses.   Respiratory: Effort normal.  Musculoskeletal:       Right lower extremity shortened. Distally neurovascularly intact Right hip  incision well healed, no erythema or warmth.  Neurological: She is alert and oriented to person, place, and time.  Skin: Skin is warm and dry.  Psychiatric: She has a normal mood and affect.     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 Risks discussed. She will go ahead with that. We will then get her admitted to the hospital tonight and fit for an abduction brace tomorrow.  Mable Paris 07/26/2012, 12:18 AM

## 2012-07-26 NOTE — Op Note (Signed)
Preoperative diagnosis: Right hip prosthetic hip dislocation Postoperative diagnosis: Same Procedure performed right hip closed reduction under anesthesia Surgeon: Berline Lopes M.D. Anesthesia: LMA Complications: None EBL: None  Indication for procedure: The patient is a 72 year old female who underwent a revision right hip total hip replacement approximately one month ago and had a subsequent dislocation 2 days ago. She was reduced in the emergency department and placed in a knee immobilizer. She had a second dislocation tonight at the dinner table. She had an attempted closed reduction in the emergency department which was not successful. She was indicated for a trip to the operating room for closed reduction under anesthesia.  Procedure: The patient was taken to the operating room where general anesthesia was induced without complication. She was kept in supine position. With longitudinal traction on the leg was easily able to reduce the dislocation. Fluoroscopy was used to show reduction of the joint. I then brought through a range of motion. There is no tendency towards dislocation with external rotation. With flexion of about 45 and internal rotation to about 50 she dislocated. I was easily able to rereduce her. She was placed in a abduction pillow allowed to awaken from general anesthesia, transferred to stretcher, and taken to the recovery room in stable condition.  Postoperative plan: She will be kept overnight for observation and in the morning will have a custom abduction brace fit.

## 2012-07-26 NOTE — Anesthesia Preprocedure Evaluation (Signed)
Anesthesia Evaluation  Patient identified by MRN, date of birth, ID band Patient awake    Reviewed: Allergy & Precautions, H&P , NPO status , Patient's Chart, lab work & pertinent test results  Airway Mallampati: II TM Distance: >3 FB Neck ROM: full    Dental No notable dental hx. (+) Teeth Intact and Dental Advisory Given   Pulmonary neg pulmonary ROS, shortness of breath and with exertion,  breath sounds clear to auscultation  Pulmonary exam normal       Cardiovascular Exercise Tolerance: Good hypertension, Pt. on medications negative cardio ROS  Rhythm:regular Rate:Normal     Neuro/Psych negative neurological ROS  negative psych ROS   GI/Hepatic negative GI ROS, Neg liver ROS,   Endo/Other  negative endocrine ROSBorderline DM  Renal/GU negative Renal ROS  negative genitourinary   Musculoskeletal   Abdominal   Peds  Hematology negative hematology ROS (+)   Anesthesia Other Findings   Reproductive/Obstetrics negative OB ROS                           Anesthesia Physical Anesthesia Plan  ASA: II  Anesthesia Plan: General   Post-op Pain Management:    Induction: Intravenous  Airway Management Planned: LMA  Additional Equipment:   Intra-op Plan:   Post-operative Plan:   Informed Consent: I have reviewed the patients History and Physical, chart, labs and discussed the procedure including the risks, benefits and alternatives for the proposed anesthesia with the patient or authorized representative who has indicated his/her understanding and acceptance.   Dental Advisory Given  Plan Discussed with: CRNA and Surgeon  Anesthesia Plan Comments:         Anesthesia Quick Evaluation

## 2012-07-27 NOTE — Care Management Note (Signed)
    Page 1 of 1   07/27/2012     4:09:10 PM   CARE MANAGEMENT NOTE 07/27/2012  Patient:  [facility]   Account Number:  [id number]  Date Initiated:  07/27/2015  Documentation initiated by:  Colleen Can  Subjective/Objective Assessment:   dx dislocation of hip; closed reduction of hip under anesthesia     Action/Plan:   CM spoke with patient. Plans are for her to return to her home in [place] where spouse will be caregiver. Seh already has RW and is active with [facility] with hhpt services.   Anticipated DC Date:  07/26/2012   Anticipated DC Plan:  HOME W HOME HEALTH SERVICES      DC Planning Services  CM consult      [facility] Choice  HOME HEALTH   Choice offered to / List presented to:          [facility] arranged  HH-2 PT      [facility] agency  [facility] Care   Status of service:  Completed, signed off Medicare Important Message given?  NA - LOS <3 / Initial given by admissions (If response is "NO", the following Medicare IM given date fields will be blank) Date Medicare IM given:   Date Additional Medicare IM given:    Discharge Disposition:  HOME W HOME HEALTH SERVICES  Per UR Regulation:    If discussed at Long Length of Stay Meetings, dates discussed:    Comments:  07/26/2012 Colleen Can BSN RN CCM [phone] [facility] intake-they will resum HHpt services Monday 07/29/2012. Resumption of HHpt order faxed to [facility] as requested, confirmation received. Pt advised of the above and voices understanding.

## 2012-07-29 ENCOUNTER — Encounter (HOSPITAL_COMMUNITY): Payer: Self-pay | Admitting: Orthopedic Surgery

## 2012-07-29 DIAGNOSIS — I10 Essential (primary) hypertension: Secondary | ICD-10-CM | POA: Diagnosis not present

## 2012-07-29 DIAGNOSIS — M6281 Muscle weakness (generalized): Secondary | ICD-10-CM | POA: Diagnosis not present

## 2012-07-29 DIAGNOSIS — R269 Unspecified abnormalities of gait and mobility: Secondary | ICD-10-CM | POA: Diagnosis not present

## 2012-07-29 DIAGNOSIS — Z96649 Presence of unspecified artificial hip joint: Secondary | ICD-10-CM | POA: Diagnosis not present

## 2012-07-29 DIAGNOSIS — Z471 Aftercare following joint replacement surgery: Secondary | ICD-10-CM | POA: Diagnosis not present

## 2012-08-02 DIAGNOSIS — Z471 Aftercare following joint replacement surgery: Secondary | ICD-10-CM | POA: Diagnosis not present

## 2012-08-02 DIAGNOSIS — M6281 Muscle weakness (generalized): Secondary | ICD-10-CM | POA: Diagnosis not present

## 2012-08-02 DIAGNOSIS — R269 Unspecified abnormalities of gait and mobility: Secondary | ICD-10-CM | POA: Diagnosis not present

## 2012-08-02 DIAGNOSIS — Z96649 Presence of unspecified artificial hip joint: Secondary | ICD-10-CM | POA: Diagnosis not present

## 2012-08-02 DIAGNOSIS — I10 Essential (primary) hypertension: Secondary | ICD-10-CM | POA: Diagnosis not present

## 2012-08-06 DIAGNOSIS — M6281 Muscle weakness (generalized): Secondary | ICD-10-CM | POA: Diagnosis not present

## 2012-08-06 DIAGNOSIS — Z96649 Presence of unspecified artificial hip joint: Secondary | ICD-10-CM | POA: Diagnosis not present

## 2012-08-06 DIAGNOSIS — R269 Unspecified abnormalities of gait and mobility: Secondary | ICD-10-CM | POA: Diagnosis not present

## 2012-08-06 DIAGNOSIS — I10 Essential (primary) hypertension: Secondary | ICD-10-CM | POA: Diagnosis not present

## 2012-08-06 DIAGNOSIS — Z471 Aftercare following joint replacement surgery: Secondary | ICD-10-CM | POA: Diagnosis not present

## 2012-08-14 DIAGNOSIS — Z96649 Presence of unspecified artificial hip joint: Secondary | ICD-10-CM | POA: Diagnosis not present

## 2012-08-14 DIAGNOSIS — I10 Essential (primary) hypertension: Secondary | ICD-10-CM | POA: Diagnosis not present

## 2012-08-14 DIAGNOSIS — Z471 Aftercare following joint replacement surgery: Secondary | ICD-10-CM | POA: Diagnosis not present

## 2012-08-14 DIAGNOSIS — M6281 Muscle weakness (generalized): Secondary | ICD-10-CM | POA: Diagnosis not present

## 2012-08-14 DIAGNOSIS — R269 Unspecified abnormalities of gait and mobility: Secondary | ICD-10-CM | POA: Diagnosis not present

## 2012-08-26 ENCOUNTER — Telehealth: Payer: Self-pay | Admitting: Family

## 2012-08-26 MED ORDER — BISOPROLOL FUMARATE 10 MG PO TABS: 10.0000 mg | ORAL_TABLET | Freq: Every day | ORAL | Status: DC

## 2012-08-26 NOTE — Telephone Encounter (Signed)
Refill- bisoprolol 10mg  tab. Take one tablet daily. Qty 90

## 2012-08-26 NOTE — Telephone Encounter (Signed)
Refill sent.

## 2012-09-04 DIAGNOSIS — M25559 Pain in unspecified hip: Secondary | ICD-10-CM | POA: Diagnosis not present

## 2012-10-07 ENCOUNTER — Telehealth: Payer: Self-pay | Admitting: *Deleted

## 2012-10-07 NOTE — Telephone Encounter (Signed)
Received fax from [facility] requesting refill on simvastatin. Form forwarded to Provider for signature.

## 2012-10-08 NOTE — Telephone Encounter (Signed)
Form faxed to [facility] at [phone].

## 2012-10-14 ENCOUNTER — Ambulatory Visit: Payer: Medicare Other | Admitting: Family

## 2012-10-16 ENCOUNTER — Ambulatory Visit: Payer: Medicare Other | Admitting: Family

## 2012-10-18 ENCOUNTER — Ambulatory Visit: Payer: Medicare Other | Admitting: Family

## 2012-10-22 DIAGNOSIS — E785 Hyperlipidemia, unspecified: Secondary | ICD-10-CM | POA: Diagnosis not present

## 2012-10-22 DIAGNOSIS — R7309 Other abnormal glucose: Secondary | ICD-10-CM | POA: Diagnosis not present

## 2012-10-22 DIAGNOSIS — I10 Essential (primary) hypertension: Secondary | ICD-10-CM | POA: Diagnosis not present

## 2012-10-23 ENCOUNTER — Ambulatory Visit (INDEPENDENT_AMBULATORY_CARE_PROVIDER_SITE_OTHER): Payer: Medicare Other | Admitting: Family

## 2012-10-23 ENCOUNTER — Telehealth: Payer: Self-pay | Admitting: *Deleted

## 2012-10-23 ENCOUNTER — Encounter: Payer: Self-pay | Admitting: Family

## 2012-10-23 VITALS — BP 150/90 | HR 58 | Temp 97.4°F | Resp 14 | Ht 67.0 in | Wt 204.0 lb

## 2012-10-23 DIAGNOSIS — R7309 Other abnormal glucose: Secondary | ICD-10-CM

## 2012-10-23 DIAGNOSIS — R7303 Prediabetes: Secondary | ICD-10-CM

## 2012-10-23 DIAGNOSIS — E785 Hyperlipidemia, unspecified: Secondary | ICD-10-CM

## 2012-10-23 DIAGNOSIS — R739 Hyperglycemia, unspecified: Secondary | ICD-10-CM

## 2012-10-23 DIAGNOSIS — I10 Essential (primary) hypertension: Secondary | ICD-10-CM | POA: Diagnosis not present

## 2012-10-23 LAB — HEMOGLOBIN A1C: Mean Plasma Glucose: 111 mg/dL (ref <117)

## 2012-10-23 MED ORDER — LOSARTAN POTASSIUM 100 MG PO TABS: 100.0000 mg | ORAL_TABLET | Freq: Every day | ORAL | Status: DC

## 2012-10-23 MED ORDER — BISOPROLOL FUMARATE 10 MG PO TABS: 10.0000 mg | ORAL_TABLET | Freq: Every day | ORAL | Status: DC

## 2012-10-23 MED ORDER — SIMVASTATIN 10 MG PO TABS: 10.0000 mg | ORAL_TABLET | Freq: Every day | ORAL | Status: DC

## 2012-10-23 MED ORDER — HYDROCHLOROTHIAZIDE 25 MG PO TABS: 25.0000 mg | ORAL_TABLET | Freq: Every day | ORAL | Status: DC

## 2012-10-23 NOTE — Progress Notes (Signed)
Subjective:    Patient ID: [name], female    DOB: 12-Aug-1940, 72 y.o.   MRN: [id number]  HPI  [name] is a 72 yr old female who presents today for follow up.  1) HTN- current meds include losartan 100mg  and bisoprolol.  Denies CP or SOB.  2) Hyperlipidemia-currently on simvastatin and fish oil. Denies myalgias.  3) borderline DM-last A1C in January was 5.9.  Review of Systems See HPI  Past Medical History  Diagnosis Date  . Hypertension   . Obesity   . Allergic rhinitis   . 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 Osteopenia 11/26/2009  . Breast cancer     "right" (06/17/2012)  . Exertional dyspnea   . Thyroid disorder     "sees endocrinologist yearly" (06/17/2012)  . Iron deficiency anemia     "hematologist watches it" (06/17/2012)  . Osteoarthritis     severe right hip osteoarthritis-s/p hip replacement  . Arthritis     "knuckles" (06/17/2012)    History   Social History  . Marital Status: Married    Spouse Name: N/A    Number of Children: N/A  . Years of Education: N/A   Occupational History  . Not on file.   Social History Main Topics  . Smoking status: Former Smoker -- 0.50 packs/day for 10 years    Types: Cigarettes    Quit date: 07/14/1974  . Smokeless tobacco: Never Used     Comment: quit in 1976 smoked for approx. 10 years  . Alcohol Use: 4.2 oz/week    7 Glasses of wine per week     Comment: drinks wine with dinner  . Drug Use: No  . Sexually Active: Not Currently   Other Topics Concern  . Not on file   Social History Narrative   Retired   The patient is married and has one child and two grandchildren that live in the area.     She drinks wine with dinner.  She quit smoking in 1976, smoked for   approximately 10 years.      Moved to [place] from [place]          Past Surgical History  Procedure Laterality Date  . Total hip arthroplasty  04/2006    right hip   . Mastectomy  1993?    bilateral mastectomy  .  Tonsillectomy  1949  . Eye surgery  2011    cataract removal both eyes  . Total hip revision  06/17/2012    "right" (06/17/2012)  . Breast biopsy  ` 1992    "bilaterlly" (06/17/2012)  . Total hip revision  06/17/2012    Procedure: TOTAL HIP REVISION;  Surgeon: [name], MD;  Location: [facility] OR;  Service: Orthopedics;  Laterality: Right;  . Hip closed reduction  07/26/2012    Procedure: CLOSED MANIPULATION HIP;  Surgeon: Mable Paris, MD;  Location: [facility] ORS;  Service: Orthopedics;  Laterality: Right;    Family History  Problem Relation Age of Onset  . Heart failure Father     deceased age 22  . Diabetes Father   . Alcohol abuse Father   . Breast cancer Mother     deceased at age 53 secondary to breast cancer    Allergies  Allergen Reactions  . Sulfonamide Derivatives Swelling    REACTION: Swelling of the face; "eyes swelled shut"  . Chocolate Other (See Comments)    Sinus problems    Current Outpatient Prescriptions on File Prior to Visit  Medication Sig Dispense Refill  . bisoprolol (ZEBETA) 10 MG tablet Take 1 tablet (10 mg total) by mouth daily.  90 tablet  1  . Cholecalciferol (VITAMIN D) 2000 UNITS CAPS Take 4,000 Units by mouth daily.      . fexofenadine (ALLEGRA) 180 MG tablet Take 180 mg by mouth daily as needed. For seasonal allergies      . fluticasone (FLONASE) 50 MCG/ACT nasal spray Place 1 spray into the nose daily.       Marland Kitchen GELATIN PO Take 2,160 mg by mouth daily.      Marland Kitchen losartan (COZAAR) 100 MG tablet Take 1 tablet (100 mg total) by mouth daily.  90 tablet  1  . Omega-3 Fatty Acids (FISH OIL) 1000 MG CAPS Take 1,000 mg by mouth daily.      . simvastatin (ZOCOR) 10 MG tablet Take 1 tablet (10 mg total) by mouth at bedtime.  90 tablet  3  . [DISCONTINUED] oxybutynin (DITROPAN) 5 MG tablet Take 1 tablet (5 mg total) by mouth 2 (two) times daily.  60 tablet  2   No current facility-administered medications on file prior to visit.    BP 150/90  Pulse 58   Temp(Src) 97.4 F (36.3 C) (Oral)  Resp 14  Ht 5\' 7"  (1.702 m)  Wt 204 lb (92.534 kg)  BMI 31.94 kg/m2  SpO2 98%  LMP 07/11/1991       Objective:   Physical Exam  Constitutional: She is oriented to person, place, and time. She appears well-developed and well-nourished. No distress.  HENT:  Head: Normocephalic and atraumatic.  Cardiovascular: Normal rate and regular rhythm.   No murmur heard. Pulmonary/Chest: Effort normal and breath sounds normal. No respiratory distress. She has no wheezes. She has no rales. She exhibits no tenderness.  Musculoskeletal: She exhibits no edema.  Neurological: She is alert and oriented to person, place, and time.  Psychiatric: She has a normal mood and affect. Her behavior is normal. Judgment and thought content normal.          Assessment & Plan:

## 2012-10-23 NOTE — Telephone Encounter (Signed)
Per verbal from Provider, cancelled 90 day scripts at [facility] for losartan, bisoprolol and simvastatin and re-sent them to mail order. Pt is aware to pick up HCTZ from [facility].

## 2012-10-23 NOTE — Patient Instructions (Addendum)
Please complete lab work prior to leaving. Follow up in 2 weeks so we can repeat your blood pressure and potassium level.

## 2012-10-23 NOTE — Assessment & Plan Note (Signed)
Tolerating statin.  Check FLP/LFT 

## 2012-10-23 NOTE — Assessment & Plan Note (Signed)
Check A1C 

## 2012-10-23 NOTE — Assessment & Plan Note (Addendum)
Pt reports- BP 145/70 generally at home.  Will add hctz, plan follow up in 2 weeks for bmet and BP recheck.  BP Readings from Last 3 Encounters:  10/23/12 150/90  07/26/12 123/69  07/26/12 123/69

## 2012-10-24 LAB — BASIC METABOLIC PANEL
Chloride: 106 mEq/L (ref 96–112)
Potassium: 4.5 mEq/L (ref 3.5–5.3)
Sodium: 140 mEq/L (ref 135–145)

## 2012-10-24 LAB — HEPATIC FUNCTION PANEL
Alkaline Phosphatase: 64 U/L (ref 39–117)
Indirect Bilirubin: 0.8 mg/dL (ref 0.0–0.9)
Total Bilirubin: 1 mg/dL (ref 0.3–1.2)

## 2012-10-27 ENCOUNTER — Telehealth: Payer: Self-pay | Admitting: Family

## 2012-10-27 NOTE — Telephone Encounter (Signed)
Please call pt and let her know that her calcium is mildly elevated.  I would like her to return to the lab in 2 weeks for ionized calcium level. Pended below.

## 2012-10-28 NOTE — Telephone Encounter (Signed)
Left message on home # to return my call. 

## 2012-10-30 NOTE — Telephone Encounter (Signed)
Notified pt. She has appointment on 11/06/12 with [place] and will do labs at that time.

## 2012-10-30 NOTE — Telephone Encounter (Signed)
Left message on home # to return my call. 

## 2012-11-06 ENCOUNTER — Encounter: Payer: Self-pay | Admitting: Family

## 2012-11-06 ENCOUNTER — Ambulatory Visit (INDEPENDENT_AMBULATORY_CARE_PROVIDER_SITE_OTHER): Payer: Medicare Other | Admitting: Family

## 2012-11-06 DIAGNOSIS — I10 Essential (primary) hypertension: Secondary | ICD-10-CM

## 2012-11-06 LAB — BASIC METABOLIC PANEL WITH GFR
BUN: 23 mg/dL (ref 6–23)
CO2: 25 meq/L (ref 19–32)
Calcium: 10.3 mg/dL (ref 8.4–10.5)
Chloride: 103 meq/L (ref 96–112)
Creat: 0.87 mg/dL (ref 0.50–1.10)
Glucose, Bld: 91 mg/dL (ref 70–99)
Potassium: 4.1 meq/L (ref 3.5–5.3)
Sodium: 135 meq/L (ref 135–145)

## 2012-11-06 NOTE — Progress Notes (Signed)
Subjective:    Patient ID: [name], female    DOB: 1940/08/16, 72 y.o.   MRN: [id number]  HPI  [name] is a 72 yr old female who presents today for follow up.  1) HTN- last visit BP was elevated. She was started hctz in additionh to her bisoprolol ans losartan. Reports that one night she had a BP 70/50's another night BP was 93/60. Reports improvement in her LE edema since starting hctz.  2) Hypercalciemia- last visit calcium level was noted to be mildly elevated.     Review of Systems See HPI  Past Medical History  Diagnosis Date  . Hypertension   . Obesity   . Allergic rhinitis   . 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 Osteopenia 11/26/2009  . Breast cancer     "right" (06/17/2012)  . Exertional dyspnea   . Thyroid disorder     "sees endocrinologist yearly" (06/17/2012)  . Iron deficiency anemia     "hematologist watches it" (06/17/2012)  . Osteoarthritis     severe right hip osteoarthritis-s/p hip replacement  . Arthritis     "knuckles" (06/17/2012)    History   Social History  . Marital Status: Married    Spouse Name: N/A    Number of Children: N/A  . Years of Education: N/A   Occupational History  . Not on file.   Social History Main Topics  . Smoking status: Former Smoker -- 0.50 packs/day for 10 years    Types: Cigarettes    Quit date: 07/14/1974  . Smokeless tobacco: Never Used     Comment: quit in 1976 smoked for approx. 10 years  . Alcohol Use: 4.2 oz/week    7 Glasses of wine per week     Comment: drinks wine with dinner  . Drug Use: No  . Sexually Active: Not Currently   Other Topics Concern  . Not on file   Social History Narrative   Retired   The patient is married and has one child and two grandchildren that live in the area.     She drinks wine with dinner.  She quit smoking in 1976, smoked for   approximately 10 years.      Moved to [place] from [place]          Past Surgical History  Procedure Laterality  Date  . Total hip arthroplasty  04/2006    right hip   . Mastectomy  1993?    bilateral mastectomy  . Tonsillectomy  1949  . Eye surgery  2011    cataract removal both eyes  . Total hip revision  06/17/2012    "right" (06/17/2012)  . Breast biopsy  ` 1992    "bilaterlly" (06/17/2012)  . Total hip revision  06/17/2012    Procedure: TOTAL HIP REVISION;  Surgeon: [name], MD;  Location: [facility] OR;  Service: Orthopedics;  Laterality: Right;  . Hip closed reduction  07/26/2012    Procedure: CLOSED MANIPULATION HIP;  Surgeon: Mable Paris, MD;  Location: [facility] OR[facility];  Service: Orthopedics;  Laterality: Right;    Family History  Problem Relation Age of Onset  . Heart failure Father     deceased age 44  . Diabetes Father   . Alcohol abuse Father   . Breast cancer Mother     deceased at age 8 secondary to breast cancer    Allergies  Allergen Reactions  . Sulfonamide Derivatives Swelling    REACTION: Swelling of the face; "eyes  swelled shut"  . Chocolate Other (See Comments)    Sinus problems    Current Outpatient Prescriptions on File Prior to Visit  Medication Sig Dispense Refill  . Cholecalciferol (VITAMIN D) 2000 UNITS CAPS Take 4,000 Units by mouth daily.      . fexofenadine (ALLEGRA) 180 MG tablet Take 180 mg by mouth daily as needed. For seasonal allergies      . fluticasone (FLONASE) 50 MCG/ACT nasal spray Place 1 spray into the nose daily.       Marland Kitchen GELATIN PO Take 2,160 mg by mouth daily.      . hydrochlorothiazide (HYDRODIURIL) 25 MG tablet Take 1 tablet (25 mg total) by mouth daily.  30 tablet  0  . losartan (COZAAR) 100 MG tablet Take 1 tablet (100 mg total) by mouth daily.  90 tablet  1  . Omega-3 Fatty Acids (FISH OIL) 1000 MG CAPS Take 1,000 mg by mouth daily.      . simvastatin (ZOCOR) 10 MG tablet Take 1 tablet (10 mg total) by mouth at bedtime.  90 tablet  1  . [DISCONTINUED] oxybutynin (DITROPAN) 5 MG tablet Take 1 tablet (5 mg total) by mouth 2 (two) times  daily.  60 tablet  2   No current facility-administered medications on file prior to visit.    BP 120/80  Pulse 66  Temp(Src) 97.8 F (36.6 C) (Oral)  Resp 16  Wt 203 lb 0.6 oz (92.098 kg)  BMI 31.79 kg/m2  SpO2 99%  LMP 07/11/1991       Objective:   Physical Exam  Constitutional: She appears well-developed and well-nourished. No distress.  Cardiovascular: Normal rate and regular rhythm.   No murmur heard. Pulmonary/Chest: Effort normal and breath sounds normal. No respiratory distress. She has no wheezes. She has no rales. She exhibits no tenderness.          Assessment & Plan:

## 2012-11-06 NOTE — Assessment & Plan Note (Signed)
Now seems overtreated.  She is enjoying the improvement in her LE edema with the hctz.  Will plan to continue, but will have her cut back on bisoprolol dose from 10mg  to 5mg .  Check BMET.

## 2012-11-06 NOTE — Assessment & Plan Note (Signed)
Check ionized calcium and PTH

## 2012-11-06 NOTE — Patient Instructions (Addendum)
Please complete lab work prior to leaving. Follow up in 1 month.  

## 2012-11-11 ENCOUNTER — Encounter: Payer: Self-pay | Admitting: Family

## 2012-11-11 ENCOUNTER — Telehealth: Payer: Self-pay | Admitting: *Deleted

## 2012-11-11 NOTE — Telephone Encounter (Signed)
Received message from pt stating she has been having anal itching and notes itching at the back of her vagina. She has been applying preparation H with little relief. Pt wonders if she should try something for yeast? Or different anal cream.  Please advise.

## 2012-11-12 DIAGNOSIS — M25559 Pain in unspecified hip: Secondary | ICD-10-CM | POA: Diagnosis not present

## 2012-11-12 MED ORDER — HYDROCHLOROTHIAZIDE 25 MG PO TABS: 25.0000 mg | ORAL_TABLET | Freq: Every day | ORAL | Status: DC

## 2012-11-12 NOTE — Telephone Encounter (Signed)
Left message on home # to return my call. 

## 2012-11-12 NOTE — Telephone Encounter (Signed)
I would recommend monistat 7. She can apply some cream externally to affected areas as well. Follow up in office if not improving.

## 2012-11-12 NOTE — Telephone Encounter (Signed)
Notified pt and she voices understanding. She reports that she did not follow up with Dr Allena Katz because of her hip problem but will contact him for follow up and further work up.

## 2012-11-12 NOTE — Telephone Encounter (Signed)
Also see lab note:  Pls call pt and see when she last saw Dr. Allena Katz. I see initial consult 5/13 for hypercalcemia/hyperparathyroid. He wanted her back in 6 months. Calcium looks ok, but lab work still showing overactive parathyroid. I would recommend that she arrange follow up with Dr. Allena Katz please.

## 2012-12-04 ENCOUNTER — Encounter: Payer: Self-pay | Admitting: Family

## 2012-12-04 ENCOUNTER — Ambulatory Visit (INDEPENDENT_AMBULATORY_CARE_PROVIDER_SITE_OTHER): Payer: Medicare Other | Admitting: Family

## 2012-12-04 VITALS — BP 110/70 | HR 60 | Temp 97.8°F | Resp 16 | Ht 67.0 in | Wt 206.1 lb

## 2012-12-04 DIAGNOSIS — I10 Essential (primary) hypertension: Secondary | ICD-10-CM

## 2012-12-04 NOTE — Progress Notes (Signed)
Subjective:    Patient ID: [name], female    DOB: 08/07/1940, 72 y.o.   MRN: [id number]  HPI  [name] is a 72 yr old female who presents today for follow up.  1) HTN- last visit bisoprolol was decreased from 10mg  to 5mg . She reports that her DBP was in the 90's.  She increased the bisoprolol back up to 10mg .  This AM she has reading 153/78.  She denies CP/SOB. She reports that she did have some pedal edema after sitting and watching soccer tournament. Elevated feet and swelling resolved.   2) Hypercalcemia- she sees endo tomorrow.      Review of Systems See HPI  Past Medical History  Diagnosis Date  . Hypertension   . Obesity   . Allergic rhinitis   . 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 Osteopenia 11/26/2009  . Breast cancer     "right" (06/17/2012)  . Exertional dyspnea   . Thyroid disorder     "sees endocrinologist yearly" (06/17/2012)  . Iron deficiency anemia     "hematologist watches it" (06/17/2012)  . Osteoarthritis     severe right hip osteoarthritis-s/p hip replacement  . Arthritis     "knuckles" (06/17/2012)    History   Social History  . Marital Status: Married    Spouse Name: N/A    Number of Children: N/A  . Years of Education: N/A   Occupational History  . Not on file.   Social History Main Topics  . Smoking status: Former Smoker -- 0.50 packs/day for 10 years    Types: Cigarettes    Quit date: 07/14/1974  . Smokeless tobacco: Never Used     Comment: quit in 1976 smoked for approx. 10 years  . Alcohol Use: 4.2 oz/week    7 Glasses of wine per week     Comment: drinks wine with dinner  . Drug Use: No  . Sexually Active: Not Currently   Other Topics Concern  . Not on file   Social History Narrative   Retired   The patient is married and has one child and two grandchildren that live in the area.     She drinks wine with dinner.  She quit smoking in 1976, smoked for   approximately 10 years.      Moved to [place] from [place]          Past Surgical History  Procedure Laterality Date  . Total hip arthroplasty  04/2006    right hip   . Mastectomy  1993?    bilateral mastectomy  . Tonsillectomy  1949  . Eye surgery  2011    cataract removal both eyes  . Total hip revision  06/17/2012    "right" (06/17/2012)  . Breast biopsy  ` 1992    "bilaterlly" (06/17/2012)  . Total hip revision  06/17/2012    Procedure: TOTAL HIP REVISION;  Surgeon: [name], MD;  Location: [facility];  Service: Orthopedics;  Laterality: Right;  . Hip closed reduction  07/26/2012    Procedure: CLOSED MANIPULATION HIP;  Surgeon: Mable Paris, MD;  Location: [facility];  Service: Orthopedics;  Laterality: Right;    Family History  Problem Relation Age of Onset  . Heart failure Father     deceased age 100  . Diabetes Father   . Alcohol abuse Father   . Breast cancer Mother     deceased at age 81 secondary to breast cancer    Allergies  Allergen  Reactions  . Sulfonamide Derivatives Swelling    REACTION: Swelling of the face; "eyes swelled shut"  . Chocolate Other (See Comments)    Sinus problems    Current Outpatient Prescriptions on File Prior to Visit  Medication Sig Dispense Refill  . bisoprolol (ZEBETA) 10 MG tablet Take 5 mg by mouth daily.      . Cholecalciferol (VITAMIN D) 2000 UNITS CAPS Take 4,000 Units by mouth daily.      . fexofenadine (ALLEGRA) 180 MG tablet Take 180 mg by mouth daily as needed. For seasonal allergies      . fluticasone (FLONASE) 50 MCG/ACT nasal spray Place 1 spray into the nose daily.       Marland Kitchen GELATIN PO Take 2,160 mg by mouth daily.      . hydrochlorothiazide (HYDRODIURIL) 25 MG tablet Take 1 tablet (25 mg total) by mouth daily.  90 tablet  0  . losartan (COZAAR) 100 MG tablet Take 1 tablet (100 mg total) by mouth daily.  90 tablet  1  . Omega-3 Fatty Acids (FISH OIL) 1000 MG CAPS Take 1,000 mg by mouth daily.      . simvastatin (ZOCOR) 10 MG tablet Take 1 tablet (10 mg total) by  mouth at bedtime.  90 tablet  1  . [DISCONTINUED] oxybutynin (DITROPAN) 5 MG tablet Take 1 tablet (5 mg total) by mouth 2 (two) times daily.  60 tablet  2   No current facility-administered medications on file prior to visit.    BP 110/70  Pulse 60  Temp(Src) 97.8 F (36.6 C) (Oral)  Resp 16  Ht 5\' 7"  (1.702 m)  Wt 206 lb 1.3 oz (93.477 kg)  BMI 32.27 kg/m2  SpO2 99%  LMP 07/11/1991       Objective:   Physical Exam  Constitutional: She is oriented to person, place, and time. She appears well-developed and well-nourished. No distress.  Cardiovascular: Normal rate and regular rhythm.   No murmur heard. Pulmonary/Chest: Effort normal and breath sounds normal. No respiratory distress. She has no wheezes. She has no rales. She exhibits no tenderness.  Musculoskeletal: She exhibits no edema.  Neurological: She is alert and oriented to person, place, and time.  Psychiatric: She has a normal mood and affect. Her behavior is normal. Judgment and thought content normal.          Assessment & Plan:

## 2012-12-04 NOTE — Patient Instructions (Addendum)
Please follow up in 3 months.  

## 2012-12-04 NOTE — Assessment & Plan Note (Signed)
BP Readings from Last 3 Encounters:  12/04/12 110/70  11/06/12 120/80  10/23/12 150/90   BP looks good today. Continue bisoprolol 10 and HCTZ.  I suspect that her recent episode of edema was related to prolonged sitting.

## 2012-12-04 NOTE — Assessment & Plan Note (Signed)
Management per endo.  Has apt tomorrow.

## 2012-12-05 DIAGNOSIS — E210 Primary hyperparathyroidism: Secondary | ICD-10-CM | POA: Diagnosis not present

## 2012-12-05 DIAGNOSIS — I10 Essential (primary) hypertension: Secondary | ICD-10-CM | POA: Diagnosis not present

## 2013-01-20 DIAGNOSIS — H01009 Unspecified blepharitis unspecified eye, unspecified eyelid: Secondary | ICD-10-CM | POA: Diagnosis not present

## 2013-01-20 DIAGNOSIS — H52209 Unspecified astigmatism, unspecified eye: Secondary | ICD-10-CM | POA: Diagnosis not present

## 2013-01-20 DIAGNOSIS — H04129 Dry eye syndrome of unspecified lacrimal gland: Secondary | ICD-10-CM | POA: Diagnosis not present

## 2013-01-20 DIAGNOSIS — H43819 Vitreous degeneration, unspecified eye: Secondary | ICD-10-CM | POA: Diagnosis not present

## 2013-03-05 ENCOUNTER — Inpatient Hospital Stay: Admission: RE | Admit: 2013-03-05 | Payer: Medicare Other | Source: Ambulatory Visit

## 2013-03-05 ENCOUNTER — Ambulatory Visit (INDEPENDENT_AMBULATORY_CARE_PROVIDER_SITE_OTHER): Payer: Medicare Other | Admitting: Family

## 2013-03-05 ENCOUNTER — Encounter: Payer: Self-pay | Admitting: Family

## 2013-03-05 VITALS — BP 122/80 | HR 60 | Temp 98.0°F | Resp 16 | Ht 67.0 in | Wt 216.0 lb

## 2013-03-05 DIAGNOSIS — R7303 Prediabetes: Secondary | ICD-10-CM

## 2013-03-05 DIAGNOSIS — E213 Hyperparathyroidism, unspecified: Secondary | ICD-10-CM

## 2013-03-05 DIAGNOSIS — I10 Essential (primary) hypertension: Secondary | ICD-10-CM | POA: Diagnosis not present

## 2013-03-05 DIAGNOSIS — E669 Obesity, unspecified: Secondary | ICD-10-CM | POA: Diagnosis not present

## 2013-03-05 DIAGNOSIS — R7309 Other abnormal glucose: Secondary | ICD-10-CM

## 2013-03-05 DIAGNOSIS — Z13820 Encounter for screening for osteoporosis: Secondary | ICD-10-CM | POA: Diagnosis not present

## 2013-03-05 DIAGNOSIS — R739 Hyperglycemia, unspecified: Secondary | ICD-10-CM

## 2013-03-05 LAB — BASIC METABOLIC PANEL WITH GFR
Chloride: 105 mEq/L (ref 96–112)
GFR, Est African American: 59 mL/min — ABNORMAL LOW
GFR, Est Non African American: 51 mL/min — ABNORMAL LOW
Potassium: 4.4 mEq/L (ref 3.5–5.3)
Sodium: 137 mEq/L (ref 135–145)

## 2013-03-05 LAB — HEMOGLOBIN A1C: Mean Plasma Glucose: 111 mg/dL (ref <117)

## 2013-03-05 MED ORDER — HYDROCHLOROTHIAZIDE 25 MG PO TABS: 25.0000 mg | ORAL_TABLET | Freq: Every day | ORAL | Status: DC

## 2013-03-05 MED ORDER — FLUTICASONE PROPIONATE 50 MCG/ACT NA SUSP: 1.0000 | Freq: Every day | NASAL | Status: DC

## 2013-03-05 NOTE — Progress Notes (Signed)
Subjective:    Patient ID: [name], female    DOB: Aug 30, 1940, 72 y.o.   MRN: [id number]  HPI  [name] isa 72 yr old female who presents today for follow up.  1) HTN- the pt has decreased her hctz to half due to urinary frequency. She is maintained on losartan and zebeta 2.5.  She denies sob/ or chest pain. She does not mild increase in her LE edema since she started the decreased dose of hctz.    2) Hyperparathyroidism- the pt met with Dr. Allena Katz in July. He is monitoring the patient conservatively. He added calcium 300mg  as she was on hctz.    3) Hx of hyperglycemia- has had recent weight gain, due for follow up A1C.  Review of Systems    see HPI  Past Medical History  Diagnosis Date  . Hypertension   . Obesity   . Allergic rhinitis   . 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 Osteopenia 11/26/2009  . Breast cancer     "right" (06/17/2012)  . Exertional dyspnea   . Thyroid disorder     "sees endocrinologist yearly" (06/17/2012)  . Iron deficiency anemia     "hematologist watches it" (06/17/2012)  . Osteoarthritis     severe right hip osteoarthritis-s/p hip replacement  . Arthritis     "knuckles" (06/17/2012)    History   Social History  . Marital Status: Married    Spouse Name: N/A    Number of Children: N/A  . Years of Education: N/A   Occupational History  . Not on file.   Social History Main Topics  . Smoking status: Former Smoker -- 0.50 packs/day for 10 years    Types: Cigarettes    Quit date: 07/14/1974  . Smokeless tobacco: Never Used     Comment: quit in 1976 smoked for approx. 10 years  . Alcohol Use: 4.2 oz/week    7 Glasses of wine per week     Comment: drinks wine with dinner  . Drug Use: No  . Sexual Activity: Not Currently   Other Topics Concern  . Not on file   Social History Narrative   Retired   The patient is married and has one child and two grandchildren that live in the area.     She drinks wine with dinner.  She quit  smoking in 1976, smoked for   approximately 10 years.      Moved to [place] from [place]          Past Surgical History  Procedure Laterality Date  . Total hip arthroplasty  04/2006    right hip   . Mastectomy  1993?    bilateral mastectomy  . Tonsillectomy  1949  . Eye surgery  2011    cataract removal both eyes  . Total hip revision  06/17/2012    "right" (06/17/2012)  . Breast biopsy  ` 1992    "bilaterlly" (06/17/2012)  . Total hip revision  06/17/2012    Procedure: TOTAL HIP REVISION;  Surgeon: [name], MD;  Location: [facility];  Service: Orthopedics;  Laterality: Right;  . Hip closed reduction  07/26/2012    Procedure: CLOSED MANIPULATION HIP;  Surgeon: Mable Paris, MD;  Location: [facility];  Service: Orthopedics;  Laterality: Right;    Family History  Problem Relation Age of Onset  . Heart failure Father     deceased age 88  . Diabetes Father   . Alcohol abuse Father   .  Breast cancer Mother     deceased at age 55 secondary to breast cancer    Allergies  Allergen Reactions  . Sulfonamide Derivatives Swelling    REACTION: Swelling of the face; "eyes swelled shut"  . Chocolate Other (See Comments)    Sinus problems    Current Outpatient Prescriptions on File Prior to Visit  Medication Sig Dispense Refill  . bisoprolol (ZEBETA) 10 MG tablet Take 5 mg by mouth daily.      . Cholecalciferol (VITAMIN D) 2000 UNITS CAPS Take 4,000 Units by mouth daily.      . fexofenadine (ALLEGRA) 180 MG tablet Take 180 mg by mouth daily as needed. For seasonal allergies      . GELATIN PO Take 2,160 mg by mouth daily.      Marland Kitchen losartan (COZAAR) 100 MG tablet Take 1 tablet (100 mg total) by mouth daily.  90 tablet  1  . Omega-3 Fatty Acids (FISH OIL) 1000 MG CAPS Take 1,000 mg by mouth daily.      . simvastatin (ZOCOR) 10 MG tablet Take 1 tablet (10 mg total) by mouth at bedtime.  90 tablet  1  . [DISCONTINUED] oxybutynin (DITROPAN) 5 MG tablet Take 1 tablet (5 mg  total) by mouth 2 (two) times daily.  60 tablet  2   No current facility-administered medications on file prior to visit.    BP 122/80  Pulse 60  Temp(Src) 98 F (36.7 C) (Oral)  Resp 16  Ht 5\' 7"  (1.702 m)  Wt 216 lb (97.977 kg)  BMI 33.82 kg/m2  SpO2 99%  LMP 07/11/1991    Objective:   Physical Exam  Constitutional: She is oriented to person, place, and time. She appears well-developed and well-nourished. No distress.  HENT:  Head: Normocephalic and atraumatic.  Cardiovascular: Normal rate and regular rhythm.   No murmur heard. Pulmonary/Chest: Effort normal and breath sounds normal. No respiratory distress. She has no wheezes. She has no rales. She exhibits no tenderness.  Musculoskeletal:  2+ bilateral LE edema.   Neurological: She is alert and oriented to person, place, and time.  Psychiatric: She has a normal mood and affect. Her behavior is normal. Judgment and thought content normal.          Assessment & Plan:

## 2013-03-05 NOTE — Assessment & Plan Note (Signed)
Check A1C 

## 2013-03-05 NOTE — Assessment & Plan Note (Signed)
Clinically stable, management per endo.  Will repeat dexa as last dexa was in 2012.

## 2013-03-05 NOTE — Patient Instructions (Addendum)
Please schedule bone density at the front desk. Complete lab work prior to leaving. Please schedule a follow up appointment in 3 months.

## 2013-03-05 NOTE — Assessment & Plan Note (Signed)
BP stable on current meds continue same. Advised pt ok to take full tab hctz as needed for swelling.

## 2013-03-06 ENCOUNTER — Encounter: Payer: Self-pay | Admitting: Family

## 2013-03-12 ENCOUNTER — Ambulatory Visit (INDEPENDENT_AMBULATORY_CARE_PROVIDER_SITE_OTHER)
Admission: RE | Admit: 2013-03-12 | Discharge: 2013-03-12 | Disposition: A | Discharge status: Home / Self Care | Payer: Medicare Other | Source: Ambulatory Visit | Attending: Family | Admitting: Family

## 2013-03-12 DIAGNOSIS — Z13820 Encounter for screening for osteoporosis: Secondary | ICD-10-CM

## 2013-03-19 ENCOUNTER — Ambulatory Visit (INDEPENDENT_AMBULATORY_CARE_PROVIDER_SITE_OTHER): Payer: Medicare Other

## 2013-03-19 DIAGNOSIS — Z23 Encounter for immunization: Secondary | ICD-10-CM | POA: Diagnosis not present

## 2013-03-20 ENCOUNTER — Encounter: Payer: Self-pay | Admitting: Family

## 2013-03-20 NOTE — Telephone Encounter (Signed)
Judeth Cornfield,  Please remove 06/18/13 appt for this pt. It was scheduled in error.  Please put Richard Pratt in this same time slot as he is who should return on 06/18/13 for follow up. Please let me know if you have any questions.

## 2013-04-22 ENCOUNTER — Encounter: Payer: Self-pay | Admitting: Family

## 2013-05-01 DIAGNOSIS — M25559 Pain in unspecified hip: Secondary | ICD-10-CM | POA: Diagnosis not present

## 2013-05-28 ENCOUNTER — Other Ambulatory Visit: Payer: Self-pay | Admitting: Family

## 2013-05-28 ENCOUNTER — Encounter: Payer: Self-pay | Admitting: Family

## 2013-05-28 ENCOUNTER — Ambulatory Visit (INDEPENDENT_AMBULATORY_CARE_PROVIDER_SITE_OTHER): Payer: Medicare Other | Admitting: Family

## 2013-05-28 VITALS — BP 170/100 | HR 73 | Temp 97.6°F | Ht 67.0 in | Wt 226.4 lb

## 2013-05-28 DIAGNOSIS — R059 Cough, unspecified: Secondary | ICD-10-CM | POA: Insufficient documentation

## 2013-05-28 DIAGNOSIS — I10 Essential (primary) hypertension: Secondary | ICD-10-CM

## 2013-05-28 DIAGNOSIS — R7309 Other abnormal glucose: Secondary | ICD-10-CM

## 2013-05-28 DIAGNOSIS — R110 Nausea: Secondary | ICD-10-CM

## 2013-05-28 DIAGNOSIS — R7303 Prediabetes: Secondary | ICD-10-CM

## 2013-05-28 DIAGNOSIS — R05 Cough: Secondary | ICD-10-CM

## 2013-05-28 DIAGNOSIS — E785 Hyperlipidemia, unspecified: Secondary | ICD-10-CM | POA: Diagnosis not present

## 2013-05-28 DIAGNOSIS — E213 Hyperparathyroidism, unspecified: Secondary | ICD-10-CM

## 2013-05-28 LAB — LIPID PANEL
Cholesterol: 143 mg/dL (ref 0–200)
HDL: 61 mg/dL (ref >39)
Total CHOL/HDL Ratio: 2.3 Ratio

## 2013-05-28 LAB — HEPATIC FUNCTION PANEL
ALT: 11 U/L (ref 0–35)
AST: 18 U/L (ref 0–37)
Albumin: 4.1 g/dL (ref 3.5–5.2)
Alkaline Phosphatase: 69 U/L (ref 39–117)
Bilirubin, Direct: 0.2 mg/dL (ref 0.0–0.3)
Indirect Bilirubin: 0.8 mg/dL (ref 0.0–0.9)
Total Bilirubin: 1 mg/dL (ref 0.3–1.2)
Total Protein: 6.9 g/dL (ref 6.0–8.3)

## 2013-05-28 LAB — LIPASE: Lipase: 100 U/L — ABNORMAL HIGH (ref 0–75)

## 2013-05-28 MED ORDER — OMEPRAZOLE 40 MG PO CPDR: 40.0000 mg | DELAYED_RELEASE_CAPSULE | Freq: Every day | ORAL | Status: DC

## 2013-05-28 NOTE — Assessment & Plan Note (Signed)
Clinically stable- management per endocrinology.

## 2013-05-28 NOTE — Assessment & Plan Note (Signed)
Clinically stable.  Last A1C 5.5.  Monitor.

## 2013-05-28 NOTE — Assessment & Plan Note (Signed)
On statin.  Tolerating. Obtain flp.

## 2013-05-28 NOTE — Progress Notes (Signed)
Subjective:    Patient ID: [name], female    DOB: 10/01/1940, 72 y.o.   MRN: [id number]  HPI  [name] is a 72 yr old female who presents today for follow up.  1) Hyperlipidemia- fasting today. Denies myalgia on simvastatin.  2) Borderline DM- Lab Results  Component Value Date   HGBA1C 5.5 03/05/2013    3) Hyperparathyroid- continues to follow with endo.  He recommended that she take a low dose calcium as long as she remains on diuretic.   4) HTN- reports BP is high at home. She stopped the diuretic due to nausea.  Reports that nausea is intermittent.  Swelling has worsened.  She has gained 10 pounds. Nausea is independent of eating. Stool pattern is unchanged. Reports that she is not using NSAIDS.  Reports that she is using an OTC med for GERD.  5) Cough- Reports very short of breath. SOB is worse with exertion.  Has some HS coughing at night.  Thinks that the cough has been going on for several months. She does report some HS post nasal drip- she is taking allegra.    Review of Systems    see HPI  Past Medical History  Diagnosis Date  . Hypertension   . Obesity   . Allergic rhinitis   . 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 Osteopenia 11/26/2009  . Breast cancer     "right" (06/17/2012)  . Exertional dyspnea   . Thyroid disorder     "sees endocrinologist yearly" (06/17/2012)  . Iron deficiency anemia     "hematologist watches it" (06/17/2012)  . Osteoarthritis     severe right hip osteoarthritis-s/p hip replacement  . Arthritis     "knuckles" (06/17/2012)    History   Social History  . Marital Status: Married    Spouse Name: N/A    Number of Children: N/A  . Years of Education: N/A   Occupational History  . Not on file.   Social History Main Topics  . Smoking status: Former Smoker -- 0.50 packs/day for 10 years    Types: Cigarettes    Quit date: 07/14/1974  . Smokeless tobacco: Never Used     Comment: quit in 1976 smoked for approx. 10 years   . Alcohol Use: 4.2 oz/week    7 Glasses of wine per week     Comment: drinks wine with dinner  . Drug Use: No  . Sexual Activity: Not Currently   Other Topics Concern  . Not on file   Social History Narrative   Retired   The patient is married and has one child and two grandchildren that live in the area.     She drinks wine with dinner.  She quit smoking in 1976, smoked for   approximately 10 years.      Moved to [place] from [place]          Past Surgical History  Procedure Laterality Date  . Total hip arthroplasty  04/2006    right hip   . Mastectomy  1993?    bilateral mastectomy  . Tonsillectomy  1949  . Eye surgery  2011    cataract removal both eyes  . Total hip revision  06/17/2012    "right" (06/17/2012)  . Breast biopsy  ` 1992    "bilaterlly" (06/17/2012)  . Total hip revision  06/17/2012    Procedure: TOTAL HIP REVISION;  Surgeon: [name], MD;  Location: [facility];  Service: Orthopedics;  Laterality: Right;  .  Hip closed reduction  07/26/2012    Procedure: CLOSED MANIPULATION HIP;  Surgeon: Mable Paris, MD;  Location: [facility];  Service: Orthopedics;  Laterality: Right;    Family History  Problem Relation Age of Onset  . Heart failure Father     deceased age 11  . Diabetes Father   . Alcohol abuse Father   . Breast cancer Mother     deceased at age 2 secondary to breast cancer    Allergies  Allergen Reactions  . Sulfonamide Derivatives Swelling    REACTION: Swelling of the face; "eyes swelled shut"  . Chocolate Other (See Comments)    Sinus problems    Current Outpatient Prescriptions on File Prior to Visit  Medication Sig Dispense Refill  . bisoprolol (ZEBETA) 10 MG tablet Take 10 mg by mouth daily.       . Cholecalciferol (VITAMIN D) 2000 UNITS CAPS Take 4,000 Units by mouth daily.      . fexofenadine (ALLEGRA) 180 MG tablet Take 180 mg by mouth daily as needed. For seasonal allergies      . fluticasone (FLONASE) 50 MCG/ACT  nasal spray Place 1 spray into the nose daily.  48 g  1  . GELATIN PO Take 2,160 mg by mouth daily.      . hydrochlorothiazide (HYDRODIURIL) 25 MG tablet Take 1 tablet (25 mg total) by mouth daily.  90 tablet  1  . losartan (COZAAR) 100 MG tablet Take 1 tablet (100 mg total) by mouth daily.  90 tablet  1  . Omega-3 Fatty Acids (FISH OIL) 1000 MG CAPS Take 1,000 mg by mouth daily.      . simvastatin (ZOCOR) 10 MG tablet Take 1 tablet (10 mg total) by mouth at bedtime.  90 tablet  1  . [DISCONTINUED] oxybutynin (DITROPAN) 5 MG tablet Take 1 tablet (5 mg total) by mouth 2 (two) times daily.  60 tablet  2   No current facility-administered medications on file prior to visit.    BP 170/100  Pulse 73  Temp(Src) 97.6 F (36.4 C) (Oral)  Ht 5\' 7"  (1.702 m)  Wt 226 lb 6.4 oz (102.694 kg)  BMI 35.45 kg/m2  SpO2 98%  LMP 07/11/1991    Objective:   Physical Exam  Constitutional: She is oriented to person, place, and time. She appears well-developed and well-nourished. No distress.  HENT:  Head: Normocephalic and atraumatic.  Cardiovascular: Normal rate and regular rhythm.   No murmur heard. Pulmonary/Chest: Effort normal and breath sounds normal. No respiratory distress. She has no wheezes. She has no rales. She exhibits no tenderness.  Musculoskeletal:  1-2+ bilateral LE edema.  Neurological: She is alert and oriented to person, place, and time.  Psychiatric: She has a normal mood and affect. Her behavior is normal. Judgment and thought content normal.          Assessment & Plan:

## 2013-05-28 NOTE — Assessment & Plan Note (Signed)
Deteriorated.  Suspect mild volume overload contributing to SOB since she discontinued her diuretic. Increase bystolic to 20mg  once daily, resume HCTZ.

## 2013-05-28 NOTE — Patient Instructions (Addendum)
Schedule ultrasound on the first floor.  Please increase bisoprolol from 10 mg to 20mg .   Restart HCTZ. Complete lab work prior to leaving. Follow up in 1 month.

## 2013-05-28 NOTE — Assessment & Plan Note (Signed)
Trial of PPI for GERD.  She is on ARB which could be contributing to cough, but she has been on this med for a long time.  Continue allegra for allergies.

## 2013-05-28 NOTE — Progress Notes (Signed)
Pre-visit discussion using our clinic review tool. No additional management support is needed unless otherwise documented below in the visit note.  

## 2013-05-29 ENCOUNTER — Telehealth: Payer: Self-pay | Admitting: Family

## 2013-05-29 DIAGNOSIS — R110 Nausea: Secondary | ICD-10-CM

## 2013-05-29 NOTE — Telephone Encounter (Signed)
Reviewed labs with pt and mildly elevated lipase.  She will complete ultrasound tomorrow.

## 2013-05-30 ENCOUNTER — Ambulatory Visit (HOSPITAL_BASED_OUTPATIENT_CLINIC_OR_DEPARTMENT_OTHER)
Admission: RE | Admit: 2013-05-30 | Discharge: 2013-05-30 | Disposition: A | Discharge status: Home / Self Care | Payer: Medicare Other | Source: Ambulatory Visit | Attending: Family | Admitting: Family

## 2013-05-30 ENCOUNTER — Other Ambulatory Visit (HOSPITAL_BASED_OUTPATIENT_CLINIC_OR_DEPARTMENT_OTHER): Payer: Medicare Other

## 2013-05-30 DIAGNOSIS — Z853 Personal history of malignant neoplasm of breast: Secondary | ICD-10-CM | POA: Diagnosis not present

## 2013-05-30 DIAGNOSIS — R110 Nausea: Secondary | ICD-10-CM | POA: Diagnosis not present

## 2013-05-30 DIAGNOSIS — R109 Unspecified abdominal pain: Secondary | ICD-10-CM | POA: Diagnosis not present

## 2013-06-02 ENCOUNTER — Telehealth: Payer: Self-pay | Admitting: Family

## 2013-06-02 DIAGNOSIS — K859 Acute pancreatitis without necrosis or infection, unspecified: Secondary | ICD-10-CM

## 2013-06-02 LAB — BASIC METABOLIC PANEL
BUN: 18 mg/dL (ref 6–23)
CO2: 24 mEq/L (ref 19–32)
Calcium: 10.5 mg/dL (ref 8.4–10.5)
Chloride: 106 mEq/L (ref 96–112)
Creat: 0.9 mg/dL (ref 0.50–1.10)

## 2013-06-02 NOTE — Telephone Encounter (Signed)
Spoke with CSR at [facility] and added BMET.  Notified pt and she is agreeable to proceed with CT.

## 2013-06-02 NOTE — Telephone Encounter (Signed)
Please call pt and let her know that her US shows normal gallbladder.  Due to bowel gas, we did not get a clear look at her pancreas.  I would like for her to complete a CT abd with contrast.  We need BMET first.  Please call lab and see if they can add on bmet to the 11/19 labs?  Dx nausea.

## 2013-06-03 ENCOUNTER — Encounter: Payer: Self-pay | Admitting: Family

## 2013-06-03 ENCOUNTER — Other Ambulatory Visit (HOSPITAL_BASED_OUTPATIENT_CLINIC_OR_DEPARTMENT_OTHER): Payer: Medicare Other

## 2013-06-06 ENCOUNTER — Ambulatory Visit (HOSPITAL_BASED_OUTPATIENT_CLINIC_OR_DEPARTMENT_OTHER)
Admission: RE | Admit: 2013-06-06 | Discharge: 2013-06-06 | Disposition: A | Discharge status: Home / Self Care | Payer: Medicare Other | Source: Ambulatory Visit | Attending: Family | Admitting: Family

## 2013-06-06 DIAGNOSIS — R748 Abnormal levels of other serum enzymes: Secondary | ICD-10-CM | POA: Insufficient documentation

## 2013-06-06 DIAGNOSIS — R110 Nausea: Secondary | ICD-10-CM | POA: Insufficient documentation

## 2013-06-06 DIAGNOSIS — K859 Acute pancreatitis without necrosis or infection, unspecified: Secondary | ICD-10-CM

## 2013-06-06 DIAGNOSIS — M5137 Other intervertebral disc degeneration, lumbosacral region: Secondary | ICD-10-CM | POA: Diagnosis not present

## 2013-06-06 DIAGNOSIS — M51379 Other intervertebral disc degeneration, lumbosacral region without mention of lumbar back pain or lower extremity pain: Secondary | ICD-10-CM | POA: Insufficient documentation

## 2013-06-06 DIAGNOSIS — Q762 Congenital spondylolisthesis: Secondary | ICD-10-CM | POA: Insufficient documentation

## 2013-06-06 DIAGNOSIS — R7989 Other specified abnormal findings of blood chemistry: Secondary | ICD-10-CM | POA: Diagnosis not present

## 2013-06-06 DIAGNOSIS — R109 Unspecified abdominal pain: Secondary | ICD-10-CM | POA: Diagnosis not present

## 2013-06-06 MED ORDER — IOHEXOL 300 MG/ML  SOLN: 100.0000 mL | Freq: Once | INTRAMUSCULAR | Status: AC | PRN

## 2013-06-13 ENCOUNTER — Encounter: Payer: Self-pay | Admitting: Family

## 2013-06-18 ENCOUNTER — Ambulatory Visit: Payer: Medicare Other | Admitting: Family

## 2013-06-25 ENCOUNTER — Encounter: Payer: Self-pay | Admitting: Family

## 2013-06-25 ENCOUNTER — Ambulatory Visit (INDEPENDENT_AMBULATORY_CARE_PROVIDER_SITE_OTHER): Payer: Medicare Other | Admitting: Family

## 2013-06-25 VITALS — BP 150/90 | HR 66 | Temp 98.4°F | Resp 16 | Ht 67.0 in | Wt 221.1 lb

## 2013-06-25 DIAGNOSIS — R748 Abnormal levels of other serum enzymes: Secondary | ICD-10-CM | POA: Diagnosis not present

## 2013-06-25 DIAGNOSIS — I10 Essential (primary) hypertension: Secondary | ICD-10-CM | POA: Diagnosis not present

## 2013-06-25 DIAGNOSIS — R05 Cough: Secondary | ICD-10-CM

## 2013-06-25 DIAGNOSIS — R059 Cough, unspecified: Secondary | ICD-10-CM | POA: Diagnosis not present

## 2013-06-25 DIAGNOSIS — Z23 Encounter for immunization: Secondary | ICD-10-CM

## 2013-06-25 LAB — BASIC METABOLIC PANEL
BUN: 22 mg/dL (ref 6–23)
CO2: 24 mEq/L (ref 19–32)
Chloride: 104 mEq/L (ref 96–112)
Glucose, Bld: 96 mg/dL (ref 70–99)
Sodium: 136 mEq/L (ref 135–145)

## 2013-06-25 LAB — LIPASE: Lipase: 24 U/L (ref 0–75)

## 2013-06-25 MED ORDER — BISOPROLOL FUMARATE 10 MG PO TABS: 20.0000 mg | ORAL_TABLET | Freq: Every day | ORAL | Status: DC

## 2013-06-25 MED ORDER — OMEPRAZOLE 40 MG PO CPDR: 40.0000 mg | DELAYED_RELEASE_CAPSULE | Freq: Every day | ORAL | Status: DC

## 2013-06-25 NOTE — Progress Notes (Signed)
Pre visit review using our clinic review tool, if applicable. No additional management support is needed unless otherwise documented below in the visit note. 

## 2013-06-25 NOTE — Patient Instructions (Signed)
Please complete lab work prior to leaving. Follow up in 3 months.  

## 2013-06-25 NOTE — Progress Notes (Signed)
Subjective:    Patient ID: [name], female    DOB: 12-Oct-1940, 72 y.o.   MRN: [id number]  HPI  [name] is a 72 yr old female who presents today for follow up of multiple medical problems.  1) HTN-reports readings 130/70 at home.  Last visit her bystolic was increased to 20mg  and her hctz was resumed. She reports feeling much better with this change. Notes resolution of shortness of breath.   BP Readings from Last 3 Encounters:  06/25/13 150/90  05/28/13 170/100  03/05/13 122/80   Wt Readings from Last 3 Encounters:  06/25/13 221 lb 1.3 oz (100.281 kg)  05/28/13 226 lb 6.4 oz (102.694 kg)  03/05/13 216 lb (97.977 kg)   2) Cough- PPI was added to her regimen last visit and pt notes much improvement in cough.   3) Elevated lipase- last visit lipase was elevated. CT abdomen was normal.  Review of Systems See HPI  Past Medical History  Diagnosis Date  . Hypertension   . Obesity   . Allergic rhinitis   . 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 Osteopenia 11/26/2009  . Breast cancer     "right" (06/17/2012)  . Exertional dyspnea   . Thyroid disorder     "sees endocrinologist yearly" (06/17/2012)  . Iron deficiency anemia     "hematologist watches it" (06/17/2012)  . Osteoarthritis     severe right hip osteoarthritis-s/p hip replacement  . Arthritis     "knuckles" (06/17/2012)    History   Social History  . Marital Status: Married    Spouse Name: N/A    Number of Children: N/A  . Years of Education: N/A   Occupational History  . Not on file.   Social History Main Topics  . Smoking status: Former Smoker -- 0.50 packs/day for 10 years    Types: Cigarettes    Quit date: 07/14/1974  . Smokeless tobacco: Never Used     Comment: quit in 1976 smoked for approx. 10 years  . Alcohol Use: 4.2 oz/week    7 Glasses of wine per week     Comment: drinks wine with dinner  . Drug Use: No  . Sexual Activity: Not Currently   Other Topics Concern  . Not on file     Social History Narrative   Retired   The patient is married and has one child and two grandchildren that live in the area.     She drinks wine with dinner.  She quit smoking in 1976, smoked for   approximately 10 years.      Moved to [place] from [place]          Past Surgical History  Procedure Laterality Date  . Total hip arthroplasty  04/2006    right hip   . Mastectomy  1993?    bilateral mastectomy  . Tonsillectomy  1949  . Eye surgery  2011    cataract removal both eyes  . Total hip revision  06/17/2012    "right" (06/17/2012)  . Breast biopsy  ` 1992    "bilaterlly" (06/17/2012)  . Total hip revision  06/17/2012    Procedure: TOTAL HIP REVISION;  Surgeon: [name], MD;  Location: [facility];  Service: Orthopedics;  Laterality: Right;  . Hip closed reduction  07/26/2012    Procedure: CLOSED MANIPULATION HIP;  Surgeon: Mable Paris, MD;  Location: [facility];  Service: Orthopedics;  Laterality: Right;    Family History  Problem Relation Age  of Onset  . Heart failure Father     deceased age 64  . Diabetes Father   . Alcohol abuse Father   . Breast cancer Mother     deceased at age 70 secondary to breast cancer    Allergies  Allergen Reactions  . Sulfonamide Derivatives Swelling    REACTION: Swelling of the face; "eyes swelled shut"  . Chocolate Other (See Comments)    Sinus problems    Current Outpatient Prescriptions on File Prior to Visit  Medication Sig Dispense Refill  . Cholecalciferol (VITAMIN D) 2000 UNITS CAPS Take 4,000 Units by mouth daily.      . fexofenadine (ALLEGRA) 180 MG tablet Take 180 mg by mouth daily as needed. For seasonal allergies      . fluticasone (FLONASE) 50 MCG/ACT nasal spray Place 1 spray into the nose daily.  48 g  1  . GELATIN PO Take 2,160 mg by mouth daily.      . hydrochlorothiazide (HYDRODIURIL) 25 MG tablet Take 1 tablet (25 mg total) by mouth daily.  90 tablet  1  . losartan (COZAAR) 100 MG tablet Take 1  tablet (100 mg total) by mouth daily.  90 tablet  1  . Omega-3 Fatty Acids (FISH OIL) 1000 MG CAPS Take 1,000 mg by mouth daily.      . simvastatin (ZOCOR) 10 MG tablet Take 1 tablet (10 mg total) by mouth at bedtime.  90 tablet  1  . [DISCONTINUED] oxybutynin (DITROPAN) 5 MG tablet Take 1 tablet (5 mg total) by mouth 2 (two) times daily.  60 tablet  2   No current facility-administered medications on file prior to visit.    BP 150/90  Pulse 66  Temp(Src) 98.4 F (36.9 C) (Oral)  Resp 16  Ht 5\' 7"  (1.702 m)  Wt 221 lb 1.3 oz (100.281 kg)  BMI 34.62 kg/m2  SpO2 99%  LMP 07/11/1991       Objective:   Physical Exam  Constitutional: She appears well-developed and well-nourished. No distress.  Cardiovascular: Normal rate and regular rhythm.   No murmur heard. Pulmonary/Chest: Effort normal and breath sounds normal. No respiratory distress. She has no wheezes. She has no rales. She exhibits no tenderness.  Musculoskeletal: She exhibits no edema.  Psychiatric: She has a normal mood and affect. Her behavior is normal. Judgment and thought content normal.          Assessment & Plan:

## 2013-06-26 DIAGNOSIS — R748 Abnormal levels of other serum enzymes: Secondary | ICD-10-CM | POA: Insufficient documentation

## 2013-06-26 NOTE — Assessment & Plan Note (Signed)
BP is improved, edema improved, SOB improved. Continue current meds.

## 2013-06-26 NOTE — Assessment & Plan Note (Signed)
Resolved with addition of PPI. Continue same.

## 2013-06-26 NOTE — Assessment & Plan Note (Addendum)
Follow up lipase is normal.

## 2013-08-18 IMAGING — CR DG HIP 1V PORT*R*
1 series · 1 of 1 positions shown · non-contrast
Comparison: Earlier films of the same day

CLINICAL DATA: Postreduction

PORTABLE RIGHT HIP - 1 VIEW

[AP]
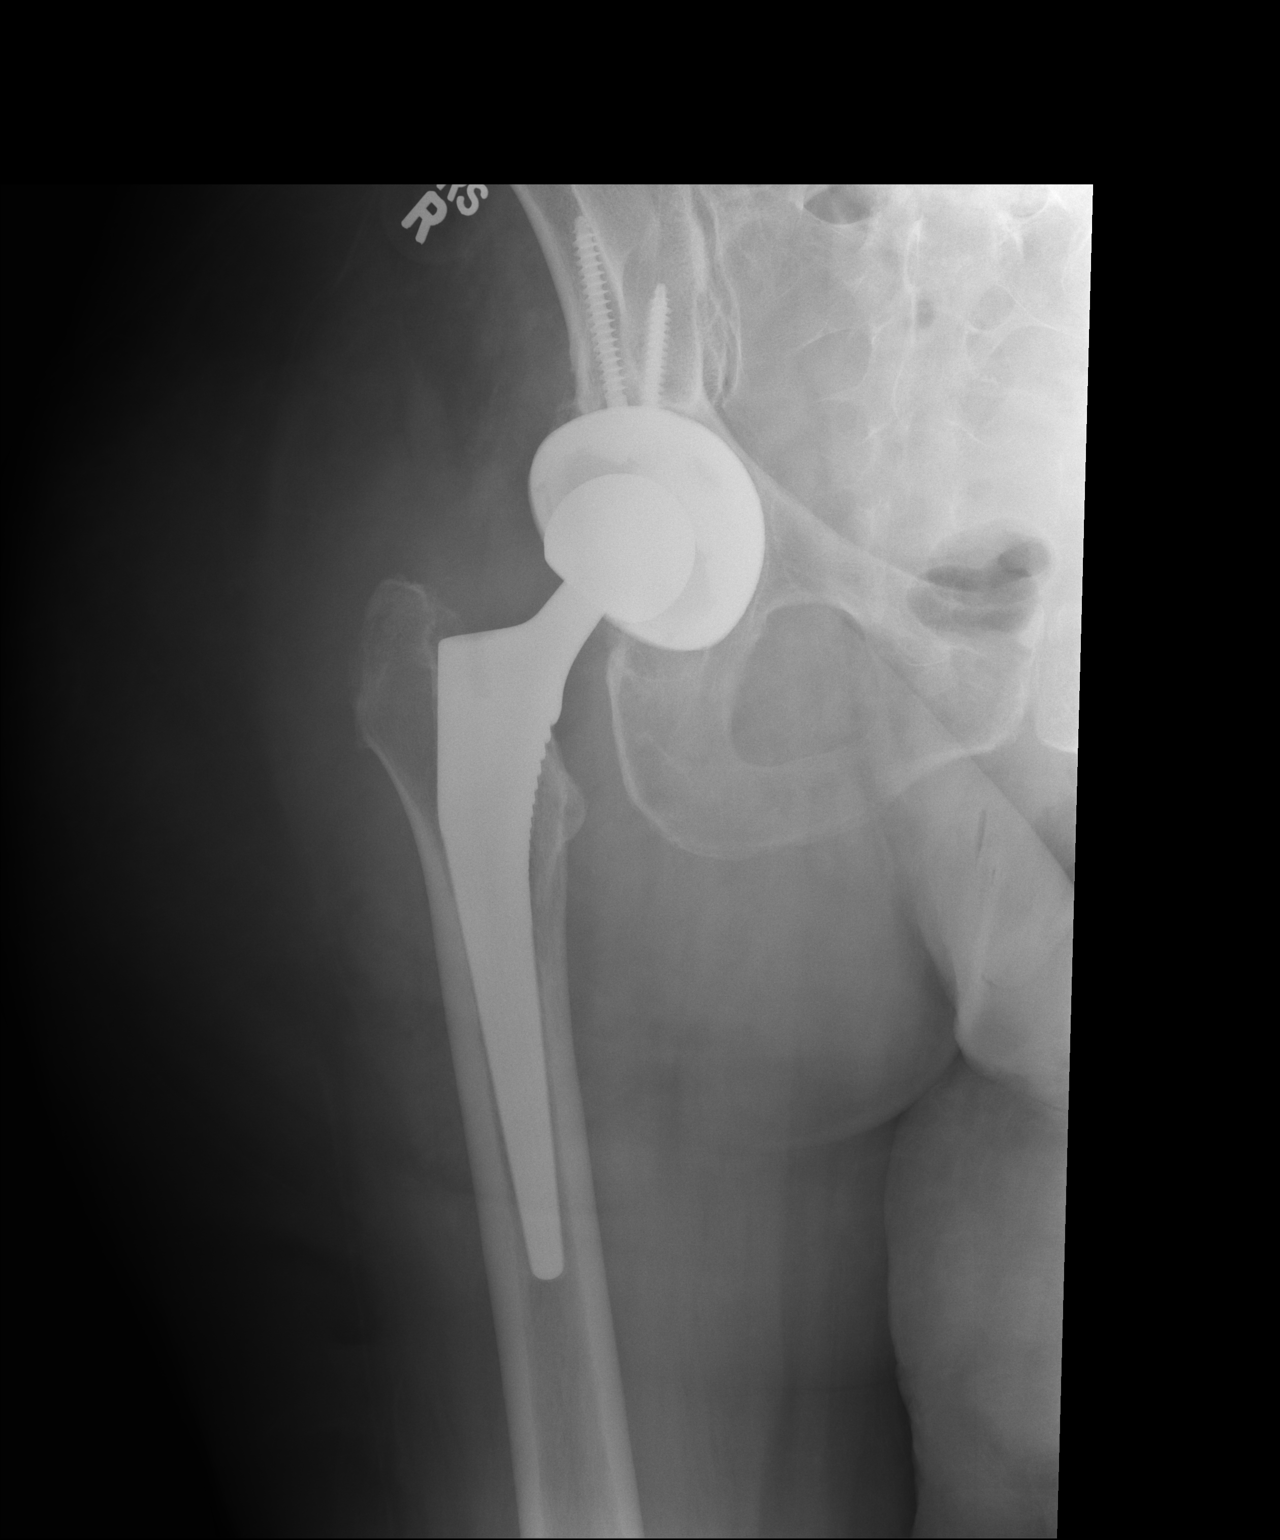

[1 of 1 positions shown; findings below may reference images not displayed]

FINDINGS: The femoral head component of hip arthroplasty projects
in the acetabular component on this single projection.  No fracture
or other complication.
IMPRESSION: 1.  Reduction of the right hip arthroplasty.

## 2013-08-19 IMAGING — CR DG HIP COMPLETE 2+V*R*
3 series · 3 of 3 positions shown · non-contrast
Comparison: the previous day's study

CLINICAL DATA: Recent hip revision, popped out.

RIGHT HIP - COMPLETE 2+ VIEW

[x pelvis]
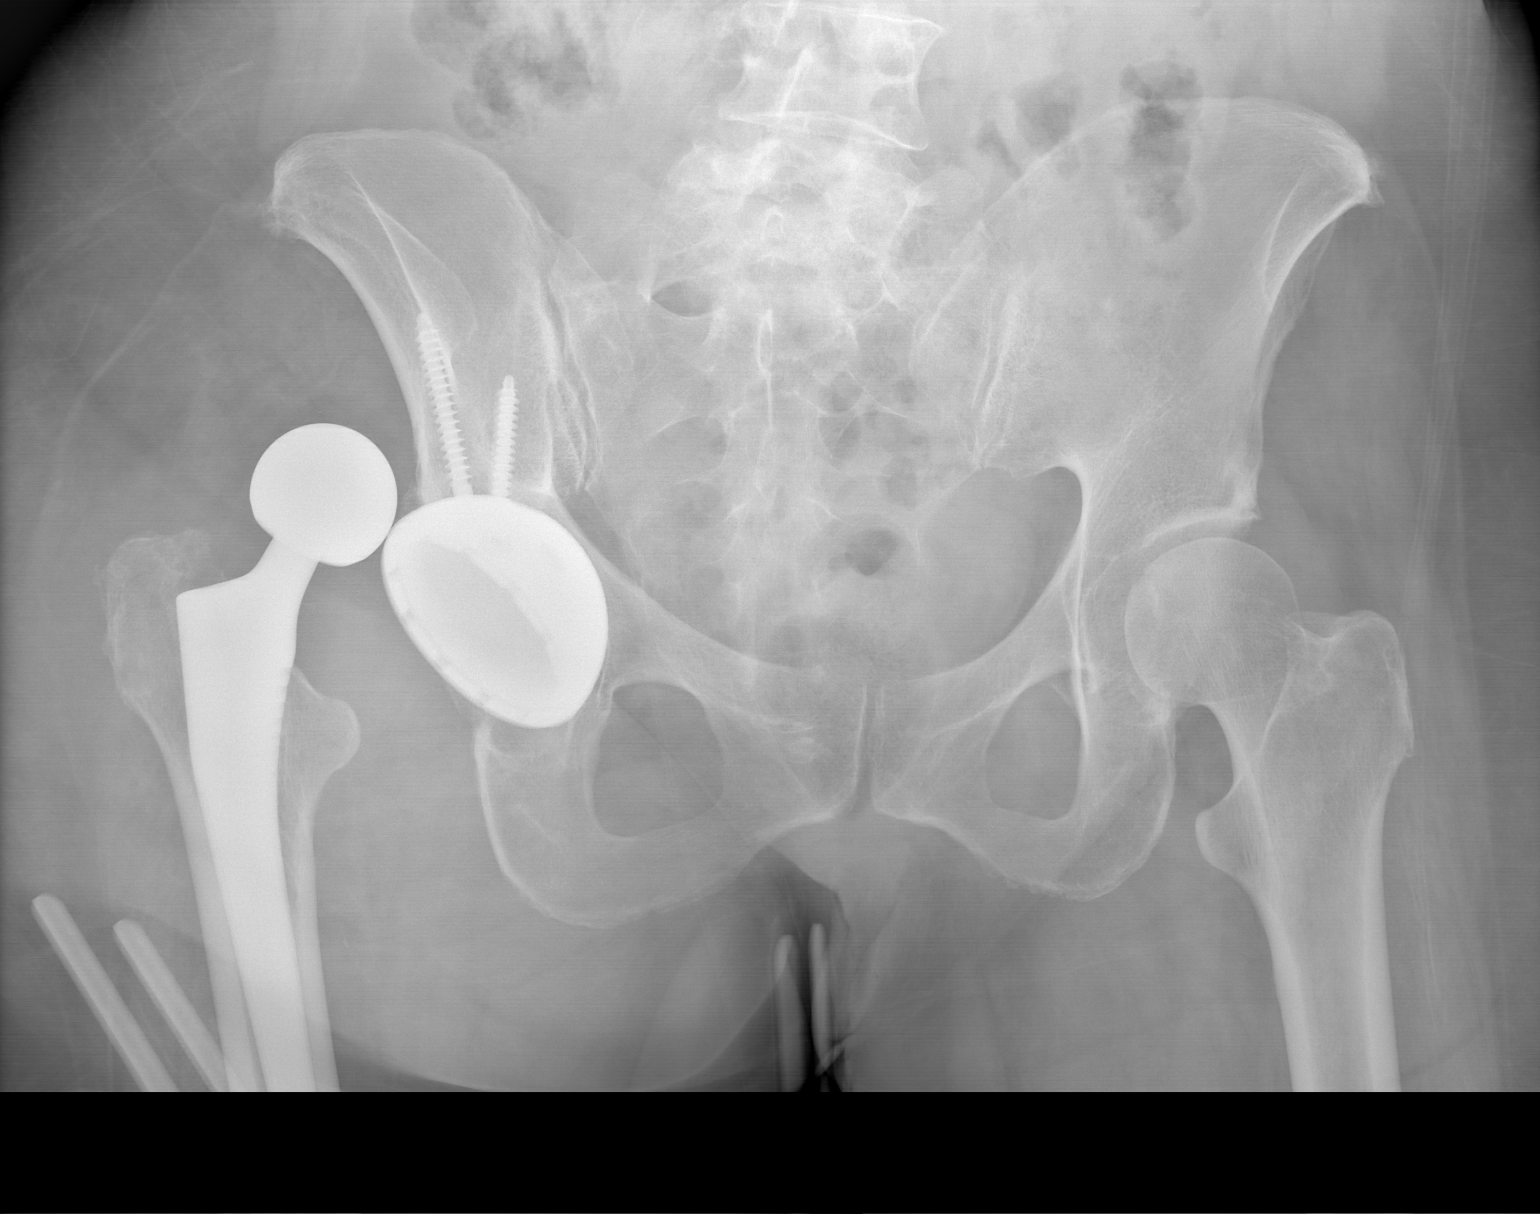

[x hip ap right]
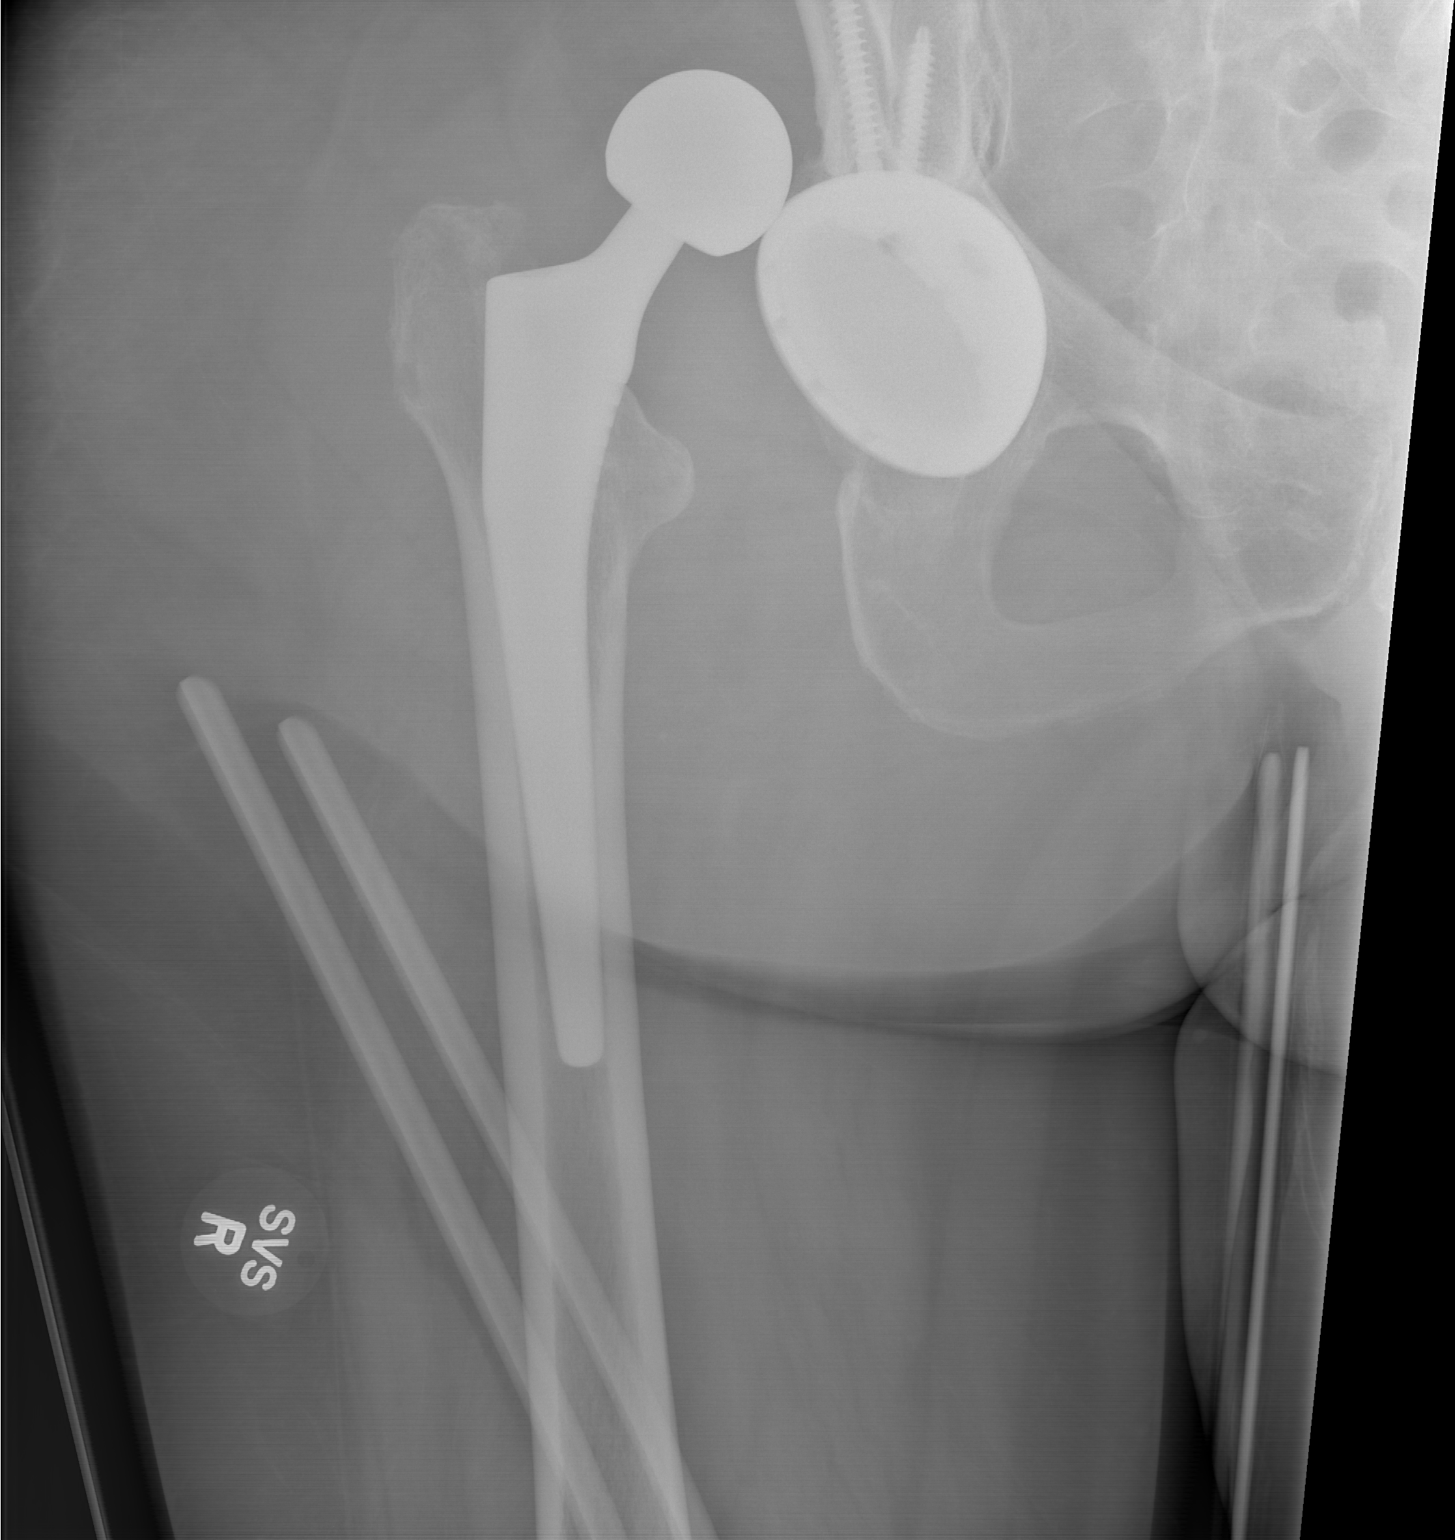

[w hip lat right]
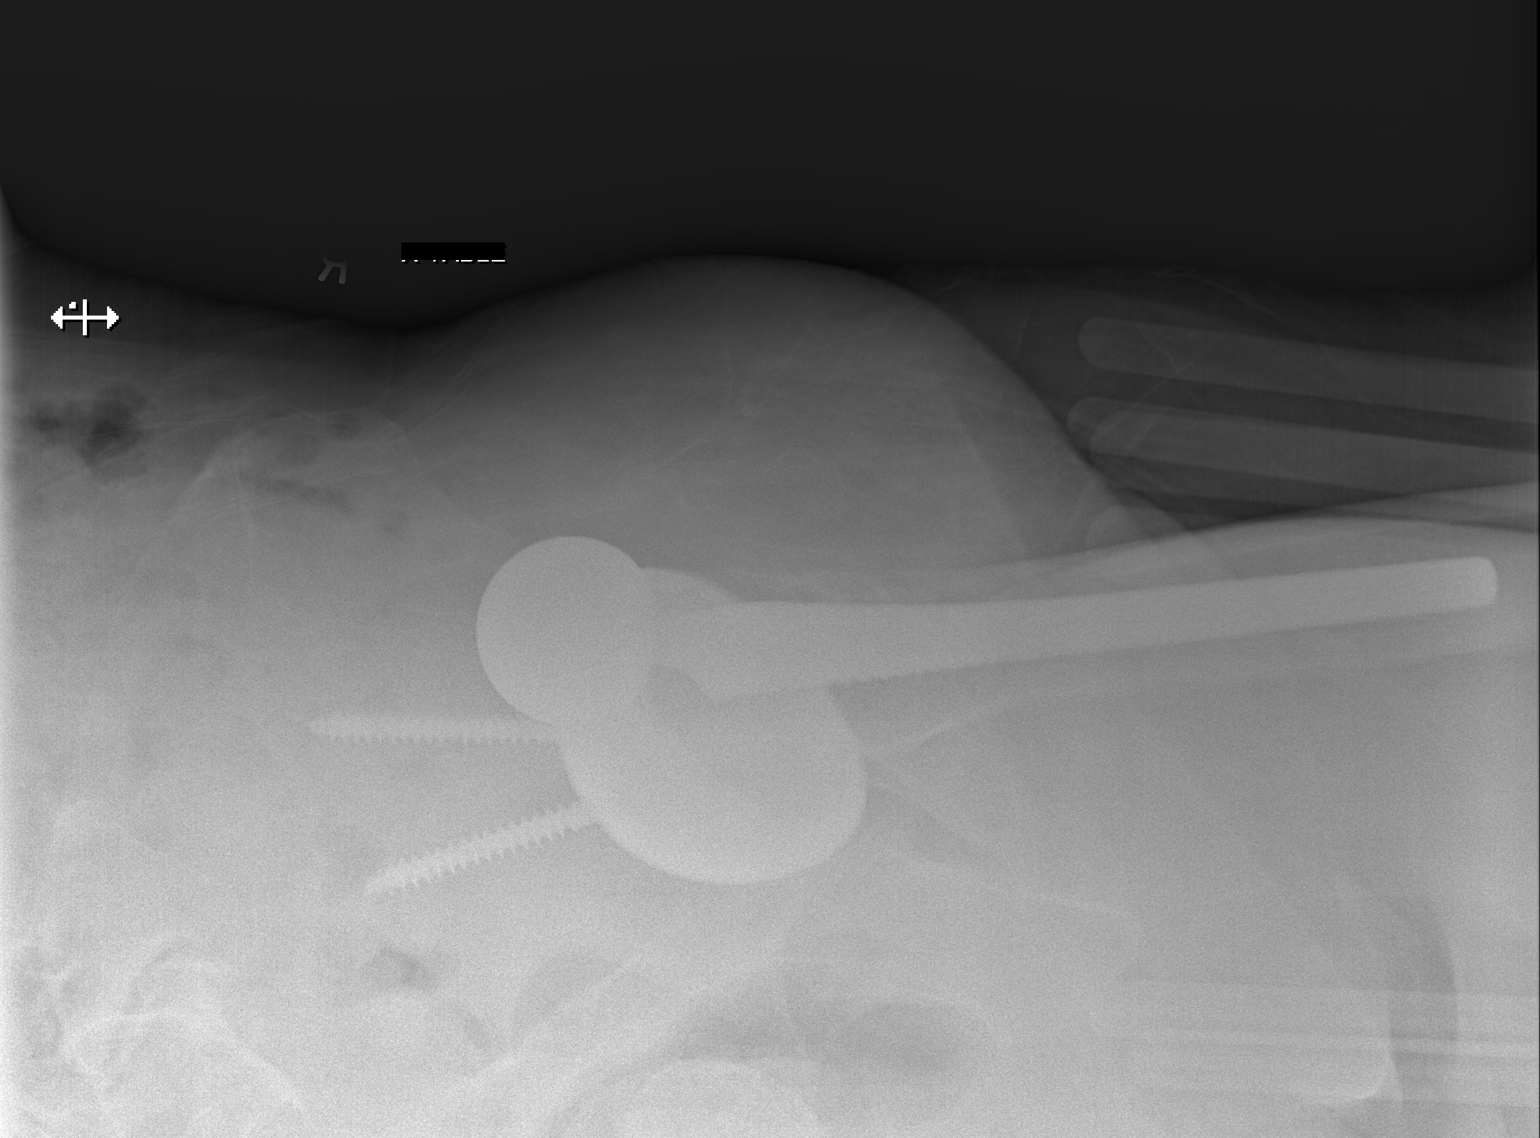

[3 of 3 positions shown; findings below may reference images not displayed]

FINDINGS: New   superior dislocation of the femoral component from
the acetabular component.  Negative for fracture or other acute
bony abnormality.
IMPRESSION: 1.  Dislocation of the right hip arthroplasty

## 2013-08-20 ENCOUNTER — Other Ambulatory Visit: Payer: Self-pay | Admitting: *Deleted

## 2013-08-20 IMAGING — RF DG HIP 1V*R*
1 series · 1 of 1 positions shown · non-contrast
Comparison: Right hip 07/25/2012.

CLINICAL DATA: Intraoperative closed reduction of right hip.

RIGHT HIP - 1 VIEW,DG C-ARM 1-60 MIN - [facility]PT [facility]

[Series 1: run · 1 of 1 slices shown]
[im 1/1]
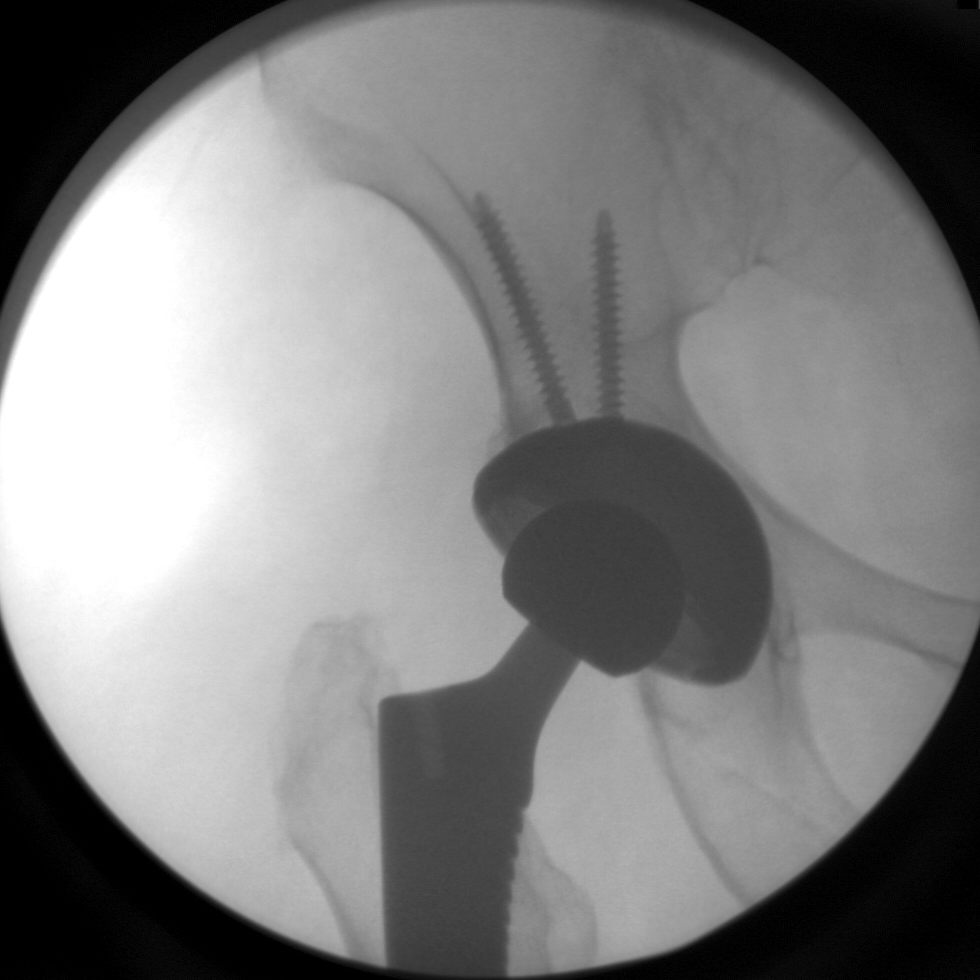

[1 of 1 positions shown; findings below may reference images not displayed]

FINDINGS: A single spot fluoroscopic view of the right hip is
obtained intraoperatively for surgical control purposes.  The image
demonstrates relocation of the right hip prosthesis since the
previous study.  No fractures identified within the field of view.
IMPRESSION: Intraoperative fluoroscopy of the right hip is obtained for
surgical control purposes demonstrating reduction of previous
dislocation of the right hip prosthesis.

## 2013-08-20 MED ORDER — LOSARTAN POTASSIUM 100 MG PO TABS: 100.0000 mg | ORAL_TABLET | Freq: Every day | ORAL | Status: DC

## 2013-08-20 NOTE — Telephone Encounter (Signed)
Rx request to pharmacy/[facility]  

## 2013-09-17 ENCOUNTER — Ambulatory Visit (INDEPENDENT_AMBULATORY_CARE_PROVIDER_SITE_OTHER): Payer: Medicare Other | Admitting: Family

## 2013-09-17 ENCOUNTER — Telehealth: Payer: Self-pay | Admitting: Family

## 2013-09-17 ENCOUNTER — Encounter: Payer: Self-pay | Admitting: Family

## 2013-09-17 VITALS — BP 130/78 | HR 60 | Temp 97.8°F | Resp 16 | Ht 67.0 in | Wt 224.1 lb

## 2013-09-17 DIAGNOSIS — E785 Hyperlipidemia, unspecified: Secondary | ICD-10-CM | POA: Diagnosis not present

## 2013-09-17 DIAGNOSIS — E213 Hyperparathyroidism, unspecified: Secondary | ICD-10-CM

## 2013-09-17 DIAGNOSIS — I10 Essential (primary) hypertension: Secondary | ICD-10-CM

## 2013-09-17 DIAGNOSIS — J309 Allergic rhinitis, unspecified: Secondary | ICD-10-CM | POA: Diagnosis not present

## 2013-09-17 NOTE — Assessment & Plan Note (Signed)
Tolerating statin, lipid at goal.  Continue same.

## 2013-09-17 NOTE — Progress Notes (Signed)
Subjective:    Patient ID: [name], female    DOB: 1941/03/05, 73 y.o.   MRN: [id number]  HPI  [name] is a 73 yr old female who presents today for follow up.  1) HTN- maintained on losartan, HCTZ,  and bisoprolol. She denies CP, minimal LE edema.   BP Readings from Last 3 Encounters:  09/17/13 130/78  06/25/13 150/90  05/28/13 170/100   2) hyperparathyroidism- she is followed annually by Dr. Posey Pronto at [facility].  She last saw him 6/14.   3) Hyperlipidemia-  On simvastatin 10.  Last LDL at goal in November.    4) allergic rhinitis- on flonase and allegra, bothering her lately Will see derm for annual skin check  Review of Systems    see HPI  Past Medical History  Diagnosis Date  . Hypertension   . Obesity   . Allergic rhinitis   . 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 Osteopenia 11/26/2009  . Breast cancer     "right" (06/17/2012)  . Exertional dyspnea   . Thyroid disorder     "sees endocrinologist yearly" (06/17/2012)  . Iron deficiency anemia     "hematologist watches it" (06/17/2012)  . Osteoarthritis     severe right hip osteoarthritis-s/p hip replacement  . Arthritis     "knuckles" (06/17/2012)    History   Social History  . Marital Status: Married    Spouse Name: N/A    Number of Children: N/A  . Years of Education: N/A   Occupational History  . Not on file.   Social History Main Topics  . Smoking status: Former Smoker -- 0.50 packs/day for 10 years    Types: Cigarettes    Quit date: 07/14/1974  . Smokeless tobacco: Never Used     Comment: quit in 1976 smoked for approx. 10 years  . Alcohol Use: 4.2 oz/week    7 Glasses of wine per week     Comment: drinks wine with dinner  . Drug Use: No  . Sexual Activity: Not Currently   Other Topics Concern  . Not on file   Social History Narrative   Retired   The patient is married and has one child and two grandchildren that live in the area.     She drinks wine with dinner.  She  quit smoking in 1976, smoked for   approximately 10 years.      Moved to [place] from [place]          Past Surgical History  Procedure Laterality Date  . Total hip arthroplasty  04/2006    right hip   . Mastectomy  1993?    bilateral mastectomy  . Tonsillectomy  1949  . Eye surgery  2011    cataract removal both eyes  . Total hip revision  06/17/2012    "right" (06/17/2012)  . Breast biopsy  ` 1992    "bilaterlly" (06/17/2012)  . Total hip revision  06/17/2012    Procedure: TOTAL HIP REVISION;  Surgeon: Kerin Salen, MD;  Location: [place];  Service: Orthopedics;  Laterality: Right;  . Hip closed reduction  07/26/2012    Procedure: CLOSED MANIPULATION HIP;  Surgeon: Nita Sells, MD;  Location: [facility];  Service: Orthopedics;  Laterality: Right;    Family History  Problem Relation Age of Onset  . Heart failure Father     deceased age 7  . Diabetes Father   . Alcohol abuse Father   . Breast cancer Mother  deceased at age 28 secondary to breast cancer    Allergies  Allergen Reactions  . Sulfonamide Derivatives Swelling    REACTION: Swelling of the face; "eyes swelled shut"  . Chocolate Other (See Comments)    Sinus problems    Current Outpatient Prescriptions on File Prior to Visit  Medication Sig Dispense Refill  . bisoprolol (ZEBETA) 10 MG tablet Take 2 tablets (20 mg total) by mouth daily.  180 tablet  1  . Cholecalciferol (VITAMIN D) 2000 UNITS CAPS Take 4,000 Units by mouth daily.      . fexofenadine (ALLEGRA) 180 MG tablet Take 180 mg by mouth daily as needed. For seasonal allergies      . fluticasone (FLONASE) 50 MCG/ACT nasal spray Place 1 spray into the nose daily.  48 g  1  . GELATIN PO Take 2,160 mg by mouth daily.      . hydrochlorothiazide (HYDRODIURIL) 25 MG tablet Take 1 tablet (25 mg total) by mouth daily.  90 tablet  1  . losartan (COZAAR) 100 MG tablet Take 1 tablet (100 mg total) by mouth daily.  90 tablet  1  . Omega-3 Fatty  Acids (FISH OIL) 1000 MG CAPS Take 1,000 mg by mouth daily.      Marland Kitchen omeprazole (PRILOSEC) 40 MG capsule Take 1 capsule (40 mg total) by mouth daily.  90 capsule  1  . simvastatin (ZOCOR) 10 MG tablet Take 1 tablet (10 mg total) by mouth at bedtime.  90 tablet  1  . [DISCONTINUED] oxybutynin (DITROPAN) 5 MG tablet Take 1 tablet (5 mg total) by mouth 2 (two) times daily.  60 tablet  2   No current facility-administered medications on file prior to visit.    BP 130/78  Pulse 60  Temp(Src) 97.8 F (36.6 C) (Oral)  Resp 16  Ht 5\' 7"  (1.702 m)  Wt 224 lb 1.3 oz (101.642 kg)  BMI 35.09 kg/m2  SpO2 98%  LMP 07/11/1991    Objective:   Physical Exam  Constitutional: She appears well-developed and well-nourished. No distress.  HENT:  Head: Normocephalic and atraumatic.  Cardiovascular: Normal rate and regular rhythm.   No murmur heard. Pulmonary/Chest: Effort normal and breath sounds normal. No respiratory distress. She has no wheezes. She has no rales. She exhibits no tenderness.  Musculoskeletal: She exhibits no edema.  Neurological:  1+ bilateral LE edema  Psychiatric: She has a normal mood and affect. Her behavior is normal. Judgment and thought content normal.          Assessment & Plan:

## 2013-09-17 NOTE — Progress Notes (Signed)
Pre visit review using our clinic review tool, if applicable. No additional management support is needed unless otherwise documented below in the visit note. 

## 2013-09-17 NOTE — Assessment & Plan Note (Signed)
BP stable on current meds. Continue same.  

## 2013-09-17 NOTE — Patient Instructions (Signed)
Please follow up in 6 months.

## 2013-09-17 NOTE — Assessment & Plan Note (Signed)
Clinically stable. Following with endo, management per endo.

## 2013-09-17 NOTE — Assessment & Plan Note (Signed)
Worsened by seasonal change.  Continue allegra and flonase.

## 2013-09-17 NOTE — Telephone Encounter (Signed)
Relevant patient education assigned to patient using Emmi. ° °

## 2013-10-15 ENCOUNTER — Telehealth: Payer: Self-pay | Admitting: *Deleted

## 2013-10-15 MED ORDER — HYDROCHLOROTHIAZIDE 25 MG PO TABS: 25.0000 mg | ORAL_TABLET | Freq: Every day | ORAL | Status: DC

## 2013-10-15 NOTE — Telephone Encounter (Signed)
Received fax from [facility] requesting refill of HCTZ.  Pt has follow up in 03/2014. Refill sent.

## 2013-10-25 ENCOUNTER — Encounter: Payer: Self-pay | Admitting: Family

## 2013-10-27 MED ORDER — TRAMADOL HCL 50 MG PO TABS: 50.0000 mg | ORAL_TABLET | Freq: Three times a day (TID) | ORAL | Status: DC | PRN

## 2013-10-27 NOTE — Telephone Encounter (Signed)
I can give her 1 month supply at a time.  #45.  Pended below. Please notify pt.

## 2013-10-27 NOTE — Telephone Encounter (Addendum)
Rx faxed to [facility]. Notified pt and she voices understanding. Asks that rx be sent to [facility]. Rx printed and forwarded to Provider for signature.

## 2013-11-06 ENCOUNTER — Other Ambulatory Visit: Payer: Self-pay | Admitting: Dermatology

## 2013-11-06 DIAGNOSIS — D485 Neoplasm of uncertain behavior of skin: Secondary | ICD-10-CM | POA: Diagnosis not present

## 2013-11-06 DIAGNOSIS — L570 Actinic keratosis: Secondary | ICD-10-CM | POA: Diagnosis not present

## 2013-11-06 DIAGNOSIS — D239 Other benign neoplasm of skin, unspecified: Secondary | ICD-10-CM | POA: Diagnosis not present

## 2013-11-06 DIAGNOSIS — L821 Other seborrheic keratosis: Secondary | ICD-10-CM | POA: Diagnosis not present

## 2013-11-06 DIAGNOSIS — Z8049 Family history of malignant neoplasm of other genital organs: Secondary | ICD-10-CM | POA: Diagnosis not present

## 2013-12-11 DIAGNOSIS — E210 Primary hyperparathyroidism: Secondary | ICD-10-CM | POA: Diagnosis not present

## 2013-12-25 DIAGNOSIS — S8990XA Unspecified injury of unspecified lower leg, initial encounter: Secondary | ICD-10-CM | POA: Diagnosis not present

## 2013-12-25 DIAGNOSIS — Z79899 Other long term (current) drug therapy: Secondary | ICD-10-CM | POA: Diagnosis not present

## 2013-12-25 DIAGNOSIS — S82843B Displaced bimalleolar fracture of unspecified lower leg, initial encounter for open fracture type I or II: Secondary | ICD-10-CM | POA: Diagnosis not present

## 2013-12-25 DIAGNOSIS — R296 Repeated falls: Secondary | ICD-10-CM | POA: Diagnosis not present

## 2013-12-25 DIAGNOSIS — S82843A Displaced bimalleolar fracture of unspecified lower leg, initial encounter for closed fracture: Secondary | ICD-10-CM | POA: Diagnosis not present

## 2013-12-25 DIAGNOSIS — I10 Essential (primary) hypertension: Secondary | ICD-10-CM | POA: Diagnosis not present

## 2013-12-25 DIAGNOSIS — M25476 Effusion, unspecified foot: Secondary | ICD-10-CM | POA: Diagnosis not present

## 2013-12-25 DIAGNOSIS — M25473 Effusion, unspecified ankle: Secondary | ICD-10-CM | POA: Diagnosis not present

## 2013-12-25 DIAGNOSIS — S99919A Unspecified injury of unspecified ankle, initial encounter: Secondary | ICD-10-CM | POA: Diagnosis not present

## 2013-12-25 DIAGNOSIS — E7800 Pure hypercholesterolemia, unspecified: Secondary | ICD-10-CM | POA: Diagnosis not present

## 2013-12-26 DIAGNOSIS — S82843A Displaced bimalleolar fracture of unspecified lower leg, initial encounter for closed fracture: Secondary | ICD-10-CM | POA: Diagnosis not present

## 2014-01-01 DIAGNOSIS — K219 Gastro-esophageal reflux disease without esophagitis: Secondary | ICD-10-CM | POA: Diagnosis not present

## 2014-01-01 DIAGNOSIS — E785 Hyperlipidemia, unspecified: Secondary | ICD-10-CM | POA: Diagnosis not present

## 2014-01-01 DIAGNOSIS — I10 Essential (primary) hypertension: Secondary | ICD-10-CM | POA: Diagnosis not present

## 2014-01-01 DIAGNOSIS — Z79899 Other long term (current) drug therapy: Secondary | ICD-10-CM | POA: Diagnosis not present

## 2014-01-01 DIAGNOSIS — E669 Obesity, unspecified: Secondary | ICD-10-CM | POA: Diagnosis not present

## 2014-01-01 DIAGNOSIS — S82853A Displaced trimalleolar fracture of unspecified lower leg, initial encounter for closed fracture: Secondary | ICD-10-CM | POA: Diagnosis not present

## 2014-01-01 DIAGNOSIS — S8263XA Displaced fracture of lateral malleolus of unspecified fibula, initial encounter for closed fracture: Secondary | ICD-10-CM | POA: Diagnosis not present

## 2014-01-01 HISTORY — PX: ANKLE FRACTURE SURGERY: SHX122

## 2014-01-15 DIAGNOSIS — S82843A Displaced bimalleolar fracture of unspecified lower leg, initial encounter for closed fracture: Secondary | ICD-10-CM | POA: Diagnosis not present

## 2014-01-23 ENCOUNTER — Telehealth: Payer: Self-pay | Admitting: *Deleted

## 2014-01-23 MED ORDER — LOSARTAN POTASSIUM 100 MG PO TABS: 100.0000 mg | ORAL_TABLET | Freq: Every day | ORAL | Status: DC

## 2014-01-23 MED ORDER — SIMVASTATIN 10 MG PO TABS: 10.0000 mg | ORAL_TABLET | Freq: Every day | ORAL | Status: DC

## 2014-01-23 MED ORDER — BISOPROLOL FUMARATE 10 MG PO TABS: 20.0000 mg | ORAL_TABLET | Freq: Every day | ORAL | Status: DC

## 2014-01-23 NOTE — Telephone Encounter (Signed)
Received fax from [facility] for refill of simvastatin and bisoprolol. Refills sent.

## 2014-01-23 NOTE — Telephone Encounter (Signed)
Also received request for losartan. Refill sent.

## 2014-01-23 NOTE — Addendum Note (Signed)
Addended by: Kelle Darting A on: 01/23/2014 08:29 AM   Modules accepted: Orders

## 2014-02-07 ENCOUNTER — Other Ambulatory Visit: Payer: Self-pay | Admitting: Family

## 2014-02-09 NOTE — Telephone Encounter (Signed)
Rx signed and faxed to pharmacy

## 2014-02-09 NOTE — Telephone Encounter (Signed)
Pt last seen in March and has f/u on 03/25/14. Rx printed and forwarded to [facility] for signature:   Medication name:  Name from pharmacy:  traMADol (ULTRAM) 50 MG tablet  TRAMADOL HCL 50 MG TABLET    Sig: TAKE 1 TABLET BY MOUTH EVERY 8 HOURS AS NEEDED    Dispense: 45 tablet Refills: 0 Start: 02/07/2014  Class: Normal    Notes to pharmacy: Not to exceed 5 additional fills before 04/25/2014    Requested on: 10/27/2013    Originally ordered on: 10/27/2013 Last refill: 10/27/2013

## 2014-02-11 ENCOUNTER — Telehealth: Payer: Self-pay | Admitting: *Deleted

## 2014-02-11 ENCOUNTER — Encounter: Payer: Self-pay | Admitting: *Deleted

## 2014-02-11 ENCOUNTER — Encounter: Payer: Self-pay | Admitting: Family

## 2014-02-11 NOTE — Telephone Encounter (Signed)
FYI

## 2014-02-11 NOTE — Telephone Encounter (Signed)
Pt left message requesting refill of tramadol. States last refills was 10/2013. Also reports that she broke her right ankle in June and has been having to use some of the tramadol for that.  Verified with pharmacy that they did receive tramadol rx on 02/09/14 and pt has not picked it up yet.  Notified pt that rx is ready at pharmacy.

## 2014-02-13 DIAGNOSIS — S82843A Displaced bimalleolar fracture of unspecified lower leg, initial encounter for closed fracture: Secondary | ICD-10-CM | POA: Diagnosis not present

## 2014-03-05 DIAGNOSIS — M25559 Pain in unspecified hip: Secondary | ICD-10-CM | POA: Diagnosis not present

## 2014-03-06 DIAGNOSIS — H2640 Unspecified secondary cataract: Secondary | ICD-10-CM | POA: Diagnosis not present

## 2014-03-06 DIAGNOSIS — H43819 Vitreous degeneration, unspecified eye: Secondary | ICD-10-CM | POA: Diagnosis not present

## 2014-03-06 DIAGNOSIS — Z961 Presence of intraocular lens: Secondary | ICD-10-CM | POA: Diagnosis not present

## 2014-03-06 DIAGNOSIS — H04129 Dry eye syndrome of unspecified lacrimal gland: Secondary | ICD-10-CM | POA: Diagnosis not present

## 2014-03-18 DIAGNOSIS — H2640 Unspecified secondary cataract: Secondary | ICD-10-CM | POA: Diagnosis not present

## 2014-03-18 DIAGNOSIS — H26499 Other secondary cataract, unspecified eye: Secondary | ICD-10-CM | POA: Diagnosis not present

## 2014-03-25 ENCOUNTER — Encounter: Payer: Self-pay | Admitting: Family

## 2014-03-25 ENCOUNTER — Ambulatory Visit (INDEPENDENT_AMBULATORY_CARE_PROVIDER_SITE_OTHER): Payer: Medicare Other | Admitting: Family

## 2014-03-25 VITALS — BP 132/63 | HR 61 | Temp 97.6°F | Resp 14 | Ht 67.0 in | Wt 235.8 lb

## 2014-03-25 DIAGNOSIS — E785 Hyperlipidemia, unspecified: Secondary | ICD-10-CM

## 2014-03-25 DIAGNOSIS — R739 Hyperglycemia, unspecified: Secondary | ICD-10-CM

## 2014-03-25 DIAGNOSIS — R7303 Prediabetes: Secondary | ICD-10-CM

## 2014-03-25 DIAGNOSIS — I10 Essential (primary) hypertension: Secondary | ICD-10-CM

## 2014-03-25 DIAGNOSIS — E213 Hyperparathyroidism, unspecified: Secondary | ICD-10-CM

## 2014-03-25 DIAGNOSIS — R7309 Other abnormal glucose: Secondary | ICD-10-CM | POA: Diagnosis not present

## 2014-03-25 DIAGNOSIS — Z23 Encounter for immunization: Secondary | ICD-10-CM

## 2014-03-25 DIAGNOSIS — K219 Gastro-esophageal reflux disease without esophagitis: Secondary | ICD-10-CM

## 2014-03-25 LAB — HEMOGLOBIN A1C: Hgb A1c MFr Bld: 5.9 % (ref 4.6–6.5)

## 2014-03-25 MED ORDER — SIMVASTATIN 10 MG PO TABS: 10.0000 mg | ORAL_TABLET | Freq: Every day | ORAL | Status: DC

## 2014-03-25 MED ORDER — BISOPROLOL FUMARATE 10 MG PO TABS
20.0000 mg | ORAL_TABLET | Freq: Every day | ORAL | Status: DC
Start: 2014-03-25 — End: 2015-01-20

## 2014-03-25 MED ORDER — FLUTICASONE PROPIONATE 50 MCG/ACT NA SUSP: 1.0000 | Freq: Every day | NASAL | Status: DC

## 2014-03-25 MED ORDER — LOSARTAN POTASSIUM 100 MG PO TABS: 100.0000 mg | ORAL_TABLET | Freq: Every day | ORAL | Status: DC

## 2014-03-25 MED ORDER — OMEPRAZOLE 40 MG PO CPDR: 40.0000 mg | DELAYED_RELEASE_CAPSULE | Freq: Every day | ORAL | Status: DC

## 2014-03-25 MED ORDER — HYDROCHLOROTHIAZIDE 25 MG PO TABS: 25.0000 mg | ORAL_TABLET | Freq: Every day | ORAL | Status: DC

## 2014-03-25 NOTE — Progress Notes (Signed)
Subjective:    Patient ID: [name], female    DOB: 1941/06/27, 73 y.o.   MRN: [id number]  HPI  [name] is a 73 yr old female who presents today for follow up.  1) HTN-on losartan, hctz, and bisoprolol.  She denies CP, SOB or swelling.  Occasional "tensing of the muscles on her chest." BP Readings from Last 3 Encounters:  03/25/14 132/63  09/17/13 130/78  06/25/13 150/90   2) Hyperparathyroid- she continues to see Dr. Posey Pronto.    3) Borderline DM2-   Lab Results  Component Value Date   HGBA1C 5.5 03/05/2013   HGBA1C 5.5 10/22/2012   HGBA1C 5.9* 07/16/2012   Lab Results  Component Value Date   LDLCALC 71 05/28/2013   CREATININE 0.91 06/25/2013   4) Hyperlipidemia- maintained on zocor 10mg .  Lab Results  Component Value Date   CHOL 143 05/28/2013   HDL 61 05/28/2013   LDLCALC 71 05/28/2013   LDLDIRECT 110.4 12/13/2006   TRIG 55 05/28/2013   CHOLHDL 2.3 05/28/2013    5) GERD- uses omeprazole every other day and reflux symptoms are well controlled.   6) Ankle fracture- in a walking cast, reports that she follows back up with ortho this weeks and hopes to be able to come out of cast.   Review of Systems See HPI  Past Medical History  Diagnosis Date  . Hypertension   . Obesity   . Allergic rhinitis   . 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 Osteopenia 11/26/2009  . Breast cancer     "right" (06/17/2012)  . Exertional dyspnea   . Thyroid disorder     "sees endocrinologist yearly" (06/17/2012)  . Iron deficiency anemia     "hematologist watches it" (06/17/2012)  . Osteoarthritis     severe right hip osteoarthritis-s/p hip replacement  . Arthritis     "knuckles" (06/17/2012)    History   Social History  . Marital Status: Married    Spouse Name: N/A    Number of Children: N/A  . Years of Education: N/A   Occupational History  . Not on file.   Social History Main Topics  . Smoking status: Former Smoker -- 0.50 packs/day for 10 years    Types:  Cigarettes    Quit date: 07/14/1974  . Smokeless tobacco: Never Used     Comment: quit in 1976 smoked for approx. 10 years  . Alcohol Use: 4.2 oz/week    7 Glasses of wine per week     Comment: drinks wine with dinner  . Drug Use: No  . Sexual Activity: Not Currently   Other Topics Concern  . Not on file   Social History Narrative   Retired   The patient is married and has one child and two grandchildren that live in the area.     She drinks wine with dinner.  She quit smoking in 1976, smoked for   approximately 10 years.      Moved to [place] from [place]          Past Surgical History  Procedure Laterality Date  . Total hip arthroplasty  04/2006    right hip   . Mastectomy  1993?    bilateral mastectomy  . Tonsillectomy  1949  . Eye surgery  2011    cataract removal both eyes  . Total hip revision  06/17/2012    "right" (06/17/2012)  . Breast biopsy  ` 1992    "bilaterlly" (06/17/2012)  . Total  hip revision  06/17/2012    Procedure: TOTAL HIP REVISION;  Surgeon: Kerin Salen, MD;  Location: [place];  Service: Orthopedics;  Laterality: Right;  . Hip closed reduction  07/26/2012    Procedure: CLOSED MANIPULATION HIP;  Surgeon: Nita Sells, MD;  Location: [facility];  Service: Orthopedics;  Laterality: Right;  . Fracture surgery Right 01/01/14    Has pins. Procedure performed at [facility]    Family History  Problem Relation Age of Onset  . Heart failure Father     deceased age 45  . Diabetes Father   . Alcohol abuse Father   . Breast cancer Mother     deceased at age 46 secondary to breast cancer    Allergies  Allergen Reactions  . Sulfonamide Derivatives Swelling    REACTION: Swelling of the face; "eyes swelled shut"  . Chocolate Other (See Comments)    Sinus problems    Current Outpatient Prescriptions on File Prior to Visit  Medication Sig Dispense Refill  . Cholecalciferol (VITAMIN D) 2000 UNITS CAPS Take 4,000 Units by mouth daily.       . fexofenadine (ALLEGRA) 180 MG tablet Take 180 mg by mouth daily as needed. For seasonal allergies      . GELATIN PO Take 2,160 mg by mouth daily.      . Omega-3 Fatty Acids (FISH OIL) 1000 MG CAPS Take 1,000 mg by mouth daily.      . traMADol (ULTRAM) 50 MG tablet TAKE 1 TABLET BY MOUTH EVERY 8 HOURS AS NEEDED  45 tablet  0  . [DISCONTINUED] oxybutynin (DITROPAN) 5 MG tablet Take 1 tablet (5 mg total) by mouth 2 (two) times daily.  60 tablet  2   No current facility-administered medications on file prior to visit.    BP 132/63  Pulse 61  Temp(Src) 97.6 F (36.4 C) (Oral)  Resp 14  Ht 5\' 7"  (1.702 m)  Wt 235 lb 12.8 oz (106.958 kg)  BMI 36.92 kg/m2  SpO2 99%  LMP 07/11/1991       Objective:   Physical Exam  Constitutional: She is oriented to person, place, and time. She appears well-developed and well-nourished. No distress.  HENT:  Head: Normocephalic and atraumatic.  Cardiovascular: Normal rate and regular rhythm.   No murmur heard. Pulmonary/Chest: Effort normal and breath sounds normal. No respiratory distress. She has no wheezes. She has no rales. She exhibits no tenderness.  Neurological: She is alert and oriented to person, place, and time.          Assessment & Plan:

## 2014-03-25 NOTE — Assessment & Plan Note (Signed)
Management per Dr. Posey Pronto.

## 2014-03-25 NOTE — Assessment & Plan Note (Signed)
BP stable on current meds. Obtain follow up bmet.

## 2014-03-25 NOTE — Assessment & Plan Note (Signed)
Stable on QOD PPI, continue same.

## 2014-03-25 NOTE — Assessment & Plan Note (Signed)
Clinically stable, obtain follow up A1C.  

## 2014-03-25 NOTE — Patient Instructions (Signed)
Please complete lab work prior to leaving. Follow up in 3 months.  

## 2014-03-25 NOTE — Assessment & Plan Note (Signed)
Lipids at go al, obtain LFT.

## 2014-03-25 NOTE — Progress Notes (Signed)
Pre visit review using our clinic review tool, if applicable. No additional management support is needed unless otherwise documented below in the visit note. 

## 2014-03-26 ENCOUNTER — Encounter: Payer: Self-pay | Admitting: Family

## 2014-03-26 LAB — BASIC METABOLIC PANEL
BUN: 20 mg/dL (ref 6–23)
CALCIUM: 10.3 mg/dL (ref 8.4–10.5)
CO2: 25 mEq/L (ref 19–32)
CREATININE: 1.1 mg/dL (ref 0.4–1.2)
Chloride: 104 mEq/L (ref 96–112)
GFR: 53.72 mL/min — ABNORMAL LOW (ref >60.00)
GLUCOSE: 116 mg/dL — AB (ref 70–99)
Potassium: 3.6 mEq/L (ref 3.5–5.1)
Sodium: 136 mEq/L (ref 135–145)

## 2014-03-26 LAB — HEPATIC FUNCTION PANEL
ALBUMIN: 4 g/dL (ref 3.5–5.2)
ALT: 12 U/L (ref 0–35)
AST: 26 U/L (ref 0–37)
Alkaline Phosphatase: 71 U/L (ref 39–117)
BILIRUBIN TOTAL: 0.8 mg/dL (ref 0.2–1.2)
Bilirubin, Direct: 0.1 mg/dL (ref 0.0–0.3)
Total Protein: 7.5 g/dL (ref 6.0–8.3)

## 2014-03-27 DIAGNOSIS — S82843A Displaced bimalleolar fracture of unspecified lower leg, initial encounter for closed fracture: Secondary | ICD-10-CM | POA: Diagnosis not present

## 2014-04-03 ENCOUNTER — Encounter: Payer: Self-pay | Admitting: Family

## 2014-04-09 ENCOUNTER — Other Ambulatory Visit: Payer: Self-pay | Admitting: Family

## 2014-04-10 NOTE — Telephone Encounter (Signed)
Rx last printed on 02/09/14. Pt has f/u 06/2014.  Rx printed and forwarded to Provider for signature.

## 2014-04-10 NOTE — Telephone Encounter (Signed)
Faxed to pharmacy

## 2014-05-06 ENCOUNTER — Encounter: Payer: Self-pay | Admitting: Internal Medicine

## 2014-05-27 ENCOUNTER — Other Ambulatory Visit: Payer: Self-pay | Admitting: Family Medicine

## 2014-05-27 NOTE — Telephone Encounter (Signed)
Pt has f/u 06/24/14. Last tramadol Rx 04/10/14.  Rx printed and forwarded to [facility] for signature.

## 2014-05-27 NOTE — Telephone Encounter (Signed)
Rx faxed to pharmacy at 2:30pm.

## 2014-06-24 ENCOUNTER — Encounter: Payer: Self-pay | Admitting: Family

## 2014-06-24 ENCOUNTER — Ambulatory Visit (INDEPENDENT_AMBULATORY_CARE_PROVIDER_SITE_OTHER): Payer: Medicare Other | Admitting: Family

## 2014-06-24 VITALS — BP 126/82 | HR 55 | Temp 97.5°F | Resp 16 | Ht 67.0 in | Wt 235.2 lb

## 2014-06-24 DIAGNOSIS — I10 Essential (primary) hypertension: Secondary | ICD-10-CM | POA: Diagnosis not present

## 2014-06-24 DIAGNOSIS — R7309 Other abnormal glucose: Secondary | ICD-10-CM

## 2014-06-24 DIAGNOSIS — F4329 Adjustment disorder with other symptoms: Secondary | ICD-10-CM

## 2014-06-24 DIAGNOSIS — R7303 Prediabetes: Secondary | ICD-10-CM

## 2014-06-24 DIAGNOSIS — E785 Hyperlipidemia, unspecified: Secondary | ICD-10-CM

## 2014-06-24 LAB — LIPID PANEL
CHOLESTEROL: 166 mg/dL (ref 0–200)
HDL: 61 mg/dL (ref >39.00)
LDL CALC: 89 mg/dL (ref 0–99)
NonHDL: 105
TRIGLYCERIDES: 80 mg/dL (ref 0.0–149.0)
Total CHOL/HDL Ratio: 3
VLDL: 16 mg/dL (ref 0.0–40.0)

## 2014-06-24 LAB — BASIC METABOLIC PANEL
BUN: 24 mg/dL — AB (ref 6–23)
CALCIUM: 10.1 mg/dL (ref 8.4–10.5)
CO2: 24 meq/L (ref 19–32)
Chloride: 106 mEq/L (ref 96–112)
Creatinine, Ser: 1 mg/dL (ref 0.4–1.2)
GFR: 58.05 mL/min — AB (ref >60.00)
Glucose, Bld: 106 mg/dL — ABNORMAL HIGH (ref 70–99)
Potassium: 3.9 mEq/L (ref 3.5–5.1)
Sodium: 133 mEq/L — ABNORMAL LOW (ref 135–145)

## 2014-06-24 NOTE — Progress Notes (Signed)
Pre visit review using our clinic review tool, if applicable. No additional management support is needed unless otherwise documented below in the visit note. 

## 2014-06-24 NOTE — Patient Instructions (Signed)
Please complete lab work prior to leaving. Follow up in 6 months, sooner if problems/concerns.  

## 2014-06-24 NOTE — Progress Notes (Signed)
Subjective:    Patient ID: [name], female    DOB: 02-Mar-1941, 73 y.o.   MRN: [id number]  HPI  Ms. Laminack is a 73 yr old female who presents today for follow up.  1) HTN- Patient is currently maintained on the following medications for blood pressure: hctz Patient reports good compliance with blood pressure medications. Patient denies chest pain, shortness of breath or swelling. Last 3 blood pressure readings in our office are as follows: BP Readings from Last 3 Encounters:  06/24/14 126/82  03/25/14 132/63  09/17/13 130/78   Wt Readings from Last 3 Encounters:  06/24/14 235 lb 3.2 oz (106.686 kg)  03/25/14 235 lb 12.8 oz (106.958 kg)  09/17/13 224 lb 1.3 oz (101.642 kg)   2) Hyperlipidemia- Patient is currently maintained on the following medication for hyperlipidemia: simvastatin Last lipid panel as follows: Lab Results  Component Value Date   CHOL 143 05/28/2013   HDL 61 05/28/2013   LDLCALC 71 05/28/2013   LDLDIRECT 110.4 12/13/2006   TRIG 55 05/28/2013   CHOLHDL 2.3 05/28/2013    Patient denies myalgia. Patient reports good compliance with low fat/low cholesterol diet.   3) Hyperglycemia-  Lab Results  Component Value Date   HGBA1C 5.9 03/25/2014   4) Depression- just moved (downsized).  Husband is having a hard time with the transition which is causing her stress.     Review of Systems    see HPI  Past Medical History  Diagnosis Date  . Hypertension   . Obesity   . Allergic rhinitis   . 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 Osteopenia 11/26/2009  . Breast cancer     "right" (06/17/2012)  . Exertional dyspnea   . Thyroid disorder     "sees endocrinologist yearly" (06/17/2012)  . Iron deficiency anemia     "hematologist watches it" (06/17/2012)  . Osteoarthritis     severe right hip osteoarthritis-s/p hip replacement  . Arthritis     "knuckles" (06/17/2012)    History   Social History  . Marital Status: Married    Spouse Name:  N/A    Number of Children: N/A  . Years of Education: N/A   Occupational History  . Not on file.   Social History Main Topics  . Smoking status: Former Smoker -- 0.50 packs/day for 10 years    Types: Cigarettes    Quit date: 07/14/1974  . Smokeless tobacco: Never Used     Comment: quit in 1976 smoked for approx. 10 years  . Alcohol Use: 4.2 oz/week    7 Glasses of wine per week     Comment: drinks wine with dinner  . Drug Use: No  . Sexual Activity: Not Currently   Other Topics Concern  . Not on file   Social History Narrative   Retired   The patient is married and has one child and two grandchildren that live in the area.     She drinks wine with dinner.  She quit smoking in 1976, smoked for   approximately 10 years.      Moved to [place] from [place]          Past Surgical History  Procedure Laterality Date  . Total hip arthroplasty  04/2006    right hip   . Mastectomy  1993?    bilateral mastectomy  . Tonsillectomy  1949  . Eye surgery  2011    cataract removal both eyes  . Total hip revision  06/17/2012    "  right" (06/17/2012)  . Breast biopsy  ` 1992    "bilaterlly" (06/17/2012)  . Total hip revision  06/17/2012    Procedure: TOTAL HIP REVISION;  Surgeon: Kerin Salen, MD;  Location: [place] [place];  Service: Orthopedics;  Laterality: Right;  . Hip closed reduction  07/26/2012    Procedure: CLOSED MANIPULATION HIP;  Surgeon: Nita Sells, MD;  Location[facility];  Service: Orthopedics;  Laterality: Right;  . Fracture surgery Right 01/01/14    Has pins. Procedure performed at [facility]    Family History  Problem Relation Age of Onset  . Heart failure Father     deceased age 8  . Diabetes Father   . Alcohol abuse Father   . Breast cancer Mother     deceased at age 76 secondary to breast cancer    Allergies  Allergen Reactions  . Sulfonamide Derivatives Swelling    REACTION: Swelling of the face; "eyes swelled shut"  . Chocolate Other  (See Comments)    Sinus problems    Current Outpatient Prescriptions on File Prior to Visit  Medication Sig Dispense Refill  . bisoprolol (ZEBETA) 10 MG tablet Take 2 tablets (20 mg total) by mouth daily. 180 tablet 1  . Cholecalciferol (VITAMIN D) 2000 UNITS CAPS Take 4,000 Units by mouth daily.    . fexofenadine (ALLEGRA) 180 MG tablet Take 180 mg by mouth daily as needed. For seasonal allergies    . fluticasone (FLONASE) 50 MCG/ACT nasal spray Place 1 spray into both nostrils daily. 48 g 1  . GELATIN PO Take 2,160 mg by mouth daily.    Marland Kitchen losartan (COZAAR) 100 MG tablet Take 1 tablet (100 mg total) by mouth daily. 90 tablet 1  . Omega-3 Fatty Acids (FISH OIL) 1000 MG CAPS Take 1,000 mg by mouth daily.    Marland Kitchen omeprazole (PRILOSEC) 40 MG capsule Take 1 capsule (40 mg total) by mouth daily. 90 capsule 1  . simvastatin (ZOCOR) 10 MG tablet Take 1 tablet (10 mg total) by mouth at bedtime. 90 tablet 1  . traMADol (ULTRAM) 50 MG tablet TABLET BY MOUTH EVERY 8 HOURS AS NEEDED 45 tablet 0  . [DISCONTINUED] oxybutynin (DITROPAN) 5 MG tablet Take 1 tablet (5 mg total) by mouth 2 (two) times daily. 60 tablet 2   No current facility-administered medications on file prior to visit.    BP 126/82 mmHg  Pulse 55  Temp(Src) 97.5 F (36.4 C) (Oral)  Resp 16  Ht 5\' 7"  (1.702 m)  Wt 235 lb 3.2 oz (106.686 kg)  BMI 36.83 kg/m2  SpO2 99%  LMP 07/11/1991    Objective:   Physical Exam  Constitutional: She is oriented to person, place, and time. She appears well-developed and well-nourished. No distress.  Cardiovascular: Normal rate and regular rhythm.   No murmur heard. Pulmonary/Chest: Effort normal and breath sounds normal. No respiratory distress. She has no wheezes. She has no rales. She exhibits no tenderness.  Neurological: She is alert and oriented to person, place, and time.  Psychiatric: Her behavior is normal. Judgment and thought content normal.  Became briefly tearful during interview           Assessment & Plan:

## 2014-06-25 ENCOUNTER — Telehealth: Payer: Self-pay | Admitting: Family

## 2014-06-25 MED ORDER — AMLODIPINE BESYLATE 5 MG PO TABS: 5.0000 mg | ORAL_TABLET | Freq: Every day | ORAL | Status: DC

## 2014-06-25 NOTE — Telephone Encounter (Signed)
Sodium is low. D/c hctz. Start amlodipine 5 mg instead.  Follow up in 1 month.

## 2014-06-26 DIAGNOSIS — S82843D Displaced bimalleolar fracture of unspecified lower leg, subsequent encounter for closed fracture with routine healing: Secondary | ICD-10-CM | POA: Diagnosis not present

## 2014-06-26 NOTE — Telephone Encounter (Signed)
Notified pt and she voices understanding.  F/u scheduled for 07/29/14 at 1:45pm.

## 2014-06-26 NOTE — Telephone Encounter (Signed)
Left message on home number

## 2014-06-28 DIAGNOSIS — F4320 Adjustment disorder, unspecified: Secondary | ICD-10-CM | POA: Insufficient documentation

## 2014-06-28 NOTE — Assessment & Plan Note (Signed)
Stable on HCTZ, obtain follow up bmet.

## 2014-06-28 NOTE — Assessment & Plan Note (Signed)
Lab Results  Component Value Date   HGBA1C 5.9 03/25/2014   Stable sugars, monitor.

## 2014-06-28 NOTE — Assessment & Plan Note (Signed)
I think that she will improve after they get more settled.  Will continue to monitor.

## 2014-06-28 NOTE — Assessment & Plan Note (Signed)
Lab Results  Component Value Date   CHOL 166 06/24/2014   HDL 61.00 06/24/2014   LDLCALC 89 06/24/2014   LDLDIRECT 110.4 12/13/2006   TRIG 80.0 06/24/2014   CHOLHDL 3 06/24/2014   Lipids stable, continue simvastatin

## 2014-07-13 ENCOUNTER — Encounter: Payer: Self-pay | Admitting: Family

## 2014-07-13 NOTE — Telephone Encounter (Signed)
Please contact pt.  I think she needs to be seen back in the office today or tomorrow for re-evaluation.  Go to ER if severe SOB occurs.

## 2014-07-13 NOTE — Telephone Encounter (Signed)
Notified pt and scheduled appt for tomorrow at 8:30am. Pt voices understanding.

## 2014-07-14 ENCOUNTER — Encounter: Payer: Self-pay | Admitting: Family

## 2014-07-14 ENCOUNTER — Ambulatory Visit (INDEPENDENT_AMBULATORY_CARE_PROVIDER_SITE_OTHER): Payer: Medicare Other | Admitting: Family

## 2014-07-14 ENCOUNTER — Other Ambulatory Visit (HOSPITAL_COMMUNITY): Payer: Self-pay | Admitting: Family

## 2014-07-14 VITALS — BP 146/88 | HR 64 | Temp 98.0°F | Resp 18 | Ht 67.0 in | Wt 248.8 lb

## 2014-07-14 DIAGNOSIS — R0600 Dyspnea, unspecified: Secondary | ICD-10-CM

## 2014-07-14 DIAGNOSIS — I509 Heart failure, unspecified: Secondary | ICD-10-CM | POA: Diagnosis not present

## 2014-07-14 DIAGNOSIS — I10 Essential (primary) hypertension: Secondary | ICD-10-CM | POA: Diagnosis not present

## 2014-07-14 DIAGNOSIS — I5031 Acute diastolic (congestive) heart failure: Secondary | ICD-10-CM

## 2014-07-14 LAB — BASIC METABOLIC PANEL
BUN: 13 mg/dL (ref 6–23)
CALCIUM: 9.9 mg/dL (ref 8.4–10.5)
CO2: 22 meq/L (ref 19–32)
CREATININE: 0.9 mg/dL (ref 0.4–1.2)
Chloride: 111 mEq/L (ref 96–112)
GFR: 62.33 mL/min (ref >60.00)
GLUCOSE: 104 mg/dL — AB (ref 70–99)
Potassium: 3.8 mEq/L (ref 3.5–5.1)
Sodium: 140 mEq/L (ref 135–145)

## 2014-07-14 LAB — BRAIN NATRIURETIC PEPTIDE: Pro B Natriuretic peptide (BNP): 457 pg/mL — ABNORMAL HIGH (ref 0.0–100.0)

## 2014-07-14 MED ORDER — FUROSEMIDE 20 MG PO TABS: ORAL_TABLET | ORAL | Status: DC

## 2014-07-14 NOTE — Progress Notes (Signed)
Subjective:    Patient ID: [name], female    DOB: 07/18/1940, 74 y.o.   MRN: [id number]  HPI  [name] is a 74 yr old female who presents today with chief complaint of shortness of breath. Symptoms started aound 12/18.  Last visit her blood pressure was well controlled however she was noted to be hyponatremic on hctz.  Hctz was changed to amlodipine.  Since that visit she has developed SOB, cough and swelling.  She has had associated weight gain of 13 pounds since her last visit.  Used her husband's albuterol which has helped. Denies SOB at rest. Does have DOE. She denies associated chest pain. Has been using husband's albuterol which helps some with her SOB.      Review of Systems    see HPI  Past Medical History  Diagnosis Date  . Hypertension   . Obesity   . Allergic rhinitis   . 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 Osteopenia 11/26/2009  . Breast cancer     "right" (06/17/2012)  . Exertional dyspnea   . Thyroid disorder     "sees endocrinologist yearly" (06/17/2012)  . Iron deficiency anemia     "hematologist watches it" (06/17/2012)  . Osteoarthritis     severe right hip osteoarthritis-s/p hip replacement  . Arthritis     "knuckles" (06/17/2012)    History   Social History  . Marital Status: Married    Spouse Name: N/A    Number of Children: N/A  . Years of Education: N/A   Occupational History  . Not on file.   Social History Main Topics  . Smoking status: Former Smoker -- 0.50 packs/day for 10 years    Types: Cigarettes    Quit date: 07/14/1974  . Smokeless tobacco: Never Used     Comment: quit in 1976 smoked for approx. 10 years  . Alcohol Use: 4.2 oz/week    7 Glasses of wine per week     Comment: drinks wine with dinner  . Drug Use: No  . Sexual Activity: Not Currently   Other Topics Concern  . Not on file   Social History Narrative   Retired   The patient is married and has one child and two grandchildren that live in the area.       She drinks wine with dinner.  She quit smoking in 1976, smoked for   approximately 10 years.      Moved to [place] from [place]          Past Surgical History  Procedure Laterality Date  . Total hip arthroplasty  04/2006    right hip   . Mastectomy  1993?    bilateral mastectomy  . Tonsillectomy  1949  . Eye surgery  2011    cataract removal both eyes  . Total hip revision  06/17/2012    "right" (06/17/2012)  . Breast biopsy  ` 1992    "bilaterlly" (06/17/2012)  . Total hip revision  06/17/2012    Procedure: TOTAL HIP REVISION;  Surgeon: Kerin Salen, MD;  Location: [place];  Service: Orthopedics;  Laterality: Right;  . Hip closed reduction  07/26/2012    Procedure: CLOSED MANIPULATION HIP;  Surgeon: Nita Sells, MD;  Location: [facility];  Service: Orthopedics;  Laterality: Right;  . Fracture surgery Right 01/01/14    Has pins. Procedure performed at [facility]    Family History  Problem Relation Age of Onset  . Heart failure Father  deceased age 55  . Diabetes Father   . Alcohol abuse Father   . Breast cancer Mother     deceased at age 26 secondary to breast cancer    Allergies  Allergen Reactions  . Amlodipine     Swelling   . Sulfonamide Derivatives Swelling    REACTION: Swelling of the face; "eyes swelled shut"  . Chocolate Other (See Comments)    Sinus problems    Current Outpatient Prescriptions on File Prior to Visit  Medication Sig Dispense Refill  . amLODipine (NORVASC) 5 MG tablet Take 1 tablet (5 mg total) by mouth daily. 30 tablet 2  . bisoprolol (ZEBETA) 10 MG tablet Take 2 tablets (20 mg total) by mouth daily. 180 tablet 1  . Cholecalciferol (VITAMIN D) 2000 UNITS CAPS Take 4,000 Units by mouth daily.    . fexofenadine (ALLEGRA) 180 MG tablet Take 180 mg by mouth daily as needed. For seasonal allergies    . fluticasone (FLONASE) 50 MCG/ACT nasal spray Place 1 spray into both nostrils daily. 48 g 1  . GELATIN PO Take 2,160 mg  by mouth daily.    Marland Kitchen losartan (COZAAR) 100 MG tablet Take 1 tablet (100 mg total) by mouth daily. 90 tablet 1  . Omega-3 Fatty Acids (FISH OIL) 1000 MG CAPS Take 1,000 mg by mouth daily.    Marland Kitchen omeprazole (PRILOSEC) 40 MG capsule Take 1 capsule (40 mg total) by mouth daily. 90 capsule 1  . simvastatin (ZOCOR) 10 MG tablet Take 1 tablet (10 mg total) by mouth at bedtime. 90 tablet 1  . traMADol (ULTRAM) 50 MG tablet TABLET BY MOUTH EVERY 8 HOURS AS NEEDED 45 tablet 0  . [DISCONTINUED] oxybutynin (DITROPAN) 5 MG tablet Take 1 tablet (5 mg total) by mouth 2 (two) times daily. 60 tablet 2   No current facility-administered medications on file prior to visit.    BP 146/88 mmHg  Pulse 64  Temp(Src) 98 F (36.7 C) (Oral)  Resp 18  Ht 5\' 7"  (1.702 m)  Wt 248 lb 12.8 oz (112.855 kg)  BMI 38.96 kg/m2  SpO2 99%  LMP 07/11/1991    Objective:   Physical Exam  Constitutional: She appears well-developed and well-nourished. No distress.  HENT:  + facial swelling is noted, no lip swelling  Cardiovascular: Normal rate and regular rhythm.   No murmur heard. Pulmonary/Chest: Effort normal and breath sounds normal. No respiratory distress. She has no wheezes.  Faint bibasilar crackles R>L  Musculoskeletal:  3+ bilateral LE edema. + swelling noted of hands as well.   Psychiatric: She has a normal mood and affect. Her behavior is normal. Judgment normal.          Assessment & Plan:

## 2014-07-14 NOTE — Progress Notes (Signed)
Pre visit review using our clinic review tool, if applicable. No additional management support is needed unless otherwise documented below in the visit note. 

## 2014-07-14 NOTE — Assessment & Plan Note (Signed)
New. EKG is obtained and notes NSR without acute ischemic changes.  Will obtain 2D echo to further evaluate cardiac function. Obtain bmet to re-assess sodium.  Will start lasix. D/c amlodipine, follow up Friday of this week.

## 2014-07-14 NOTE — Patient Instructions (Addendum)
Please complete lab work prior to leaving. Start lasix, stop amlodipine.  You will be contacted about your echocardiogram. Go to the ER if you develop chest pain or worsening shortness of breath.  Follow up with me on Friday- (ok to book at 1pm)

## 2014-07-15 ENCOUNTER — Ambulatory Visit (HOSPITAL_COMMUNITY): Payer: Medicare Other | Attending: Cardiovascular Disease

## 2014-07-15 ENCOUNTER — Telehealth: Payer: Self-pay | Admitting: Family

## 2014-07-15 DIAGNOSIS — I5031 Acute diastolic (congestive) heart failure: Secondary | ICD-10-CM | POA: Insufficient documentation

## 2014-07-15 NOTE — Progress Notes (Signed)
2D Echo completed. 07/15/2014 

## 2014-07-15 NOTE — Telephone Encounter (Signed)
Spoke with pt. Reviewed echo results. She reports swelling has improved.  Much less sob. Advised pt to keep follow up apt on Friday as scheduled. She verbalizes understanding.

## 2014-07-17 ENCOUNTER — Encounter: Payer: Self-pay | Admitting: Family

## 2014-07-17 ENCOUNTER — Ambulatory Visit (INDEPENDENT_AMBULATORY_CARE_PROVIDER_SITE_OTHER): Payer: Medicare Other | Admitting: Family

## 2014-07-17 VITALS — BP 138/84 | HR 86 | Temp 98.0°F | Resp 16 | Ht 67.0 in | Wt 241.0 lb

## 2014-07-17 DIAGNOSIS — I509 Heart failure, unspecified: Secondary | ICD-10-CM | POA: Diagnosis not present

## 2014-07-17 DIAGNOSIS — I10 Essential (primary) hypertension: Secondary | ICD-10-CM

## 2014-07-17 LAB — BASIC METABOLIC PANEL
BUN: 17 mg/dL (ref 6–23)
CO2: 27 meq/L (ref 19–32)
CREATININE: 1 mg/dL (ref 0.4–1.2)
Calcium: 10 mg/dL (ref 8.4–10.5)
Chloride: 105 mEq/L (ref 96–112)
GFR: 60.84 mL/min (ref >60.00)
Glucose, Bld: 100 mg/dL — ABNORMAL HIGH (ref 70–99)
Potassium: 3.5 mEq/L (ref 3.5–5.1)
Sodium: 139 mEq/L (ref 135–145)

## 2014-07-17 MED ORDER — FUROSEMIDE 20 MG PO TABS: ORAL_TABLET | ORAL | Status: DC

## 2014-07-17 NOTE — Assessment & Plan Note (Signed)
Clinically resolved. Advise that we check follow up bmet to assess electrolytes and decrease lasix to 20mg  once daily with close monitoring of weights at home.

## 2014-07-17 NOTE — Progress Notes (Signed)
Subjective:    Patient ID: [name], female    DOB: 1941/03/14, 74 y.o.   MRN: [id number]  HPI  [name] is a 74 yr old female who presents today for follow up of her CHF exacerbation. She was seen earlier this week and noted to be volume overloaded since amlodipine replaced her hctz.  BNP was elevated, however 2D echo showed good cardiac function.  She was placed on lasix 20mg  PO BID. She is down 7 pounds since her last visit but still about 5-6 pounds away from her dry weight.   Wt Readings from Last 3 Encounters:  07/17/14 241 lb (109.317 kg)  07/14/14 248 lb 12.8 oz (112.855 kg)  06/24/14 235 lb 3.2 oz (106.686 kg)     Review of Systems See HPI  Past Medical History  Diagnosis Date  . Hypertension   . Obesity   . Allergic rhinitis   . 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 Osteopenia 11/26/2009  . Breast cancer     "right" (06/17/2012)  . Exertional dyspnea   . Thyroid disorder     "sees endocrinologist yearly" (06/17/2012)  . Iron deficiency anemia     "hematologist watches it" (06/17/2012)  . Osteoarthritis     severe right hip osteoarthritis-s/p hip replacement  . Arthritis     "knuckles" (06/17/2012)    History   Social History  . Marital Status: Married    Spouse Name: N/A    Number of Children: N/A  . Years of Education: N/A   Occupational History  . Not on file.   Social History Main Topics  . Smoking status: Former Smoker -- 0.50 packs/day for 10 years    Types: Cigarettes    Quit date: 07/14/1974  . Smokeless tobacco: Never Used     Comment: quit in 1976 smoked for approx. 10 years  . Alcohol Use: 4.2 oz/week    7 Glasses of wine per week     Comment: drinks wine with dinner  . Drug Use: No  . Sexual Activity: Not Currently   Other Topics Concern  . Not on file   Social History Narrative   Retired   The patient is married and has one child and two grandchildren that live in the area.     She drinks wine with dinner.  She quit  smoking in 1976, smoked for   approximately 10 years.      Moved to [place] from [place]          Past Surgical History  Procedure Laterality Date  . Total hip arthroplasty  04/2006    right hip   . Mastectomy  1993?    bilateral mastectomy  . Tonsillectomy  1949  . Eye surgery  2011    cataract removal both eyes  . Total hip revision  06/17/2012    "right" (06/17/2012)  . Breast biopsy  ` 1992    "bilaterlly" (06/17/2012)  . Total hip revision  06/17/2012    Procedure: TOTAL HIP REVISION;  Surgeon: Kerin Salen, MD;  Location: [place];  Service: Orthopedics;  Laterality: Right;  . Hip closed reduction  07/26/2012    Procedure: CLOSED MANIPULATION HIP;  Surgeon: Nita Sells, MD;  Location: [facility];  Service: Orthopedics;  Laterality: Right;  . Fracture surgery Right 01/01/14    Has pins. Procedure performed at [facility]    Family History  Problem Relation Age of Onset  . Heart failure Father  deceased age 15  . Diabetes Father   . Alcohol abuse Father   . Breast cancer Mother     deceased at age 20 secondary to breast cancer    Allergies  Allergen Reactions  . Amlodipine     Swelling   . Sulfonamide Derivatives Swelling    REACTION: Swelling of the face; "eyes swelled shut"  . Chocolate Other (See Comments)    Sinus problems    Current Outpatient Prescriptions on File Prior to Visit  Medication Sig Dispense Refill  . bisoprolol (ZEBETA) 10 MG tablet Take 2 tablets (20 mg total) by mouth daily. 180 tablet 1  . Cholecalciferol (VITAMIN D) 2000 UNITS CAPS Take 4,000 Units by mouth daily.    . fexofenadine (ALLEGRA) 180 MG tablet Take 180 mg by mouth daily as needed. For seasonal allergies    . fluticasone (FLONASE) 50 MCG/ACT nasal spray Place 1 spray into both nostrils daily. 48 g 1  . furosemide (LASIX) 20 MG tablet Take 2 tabs by mouth this morning, then one tab by mouth twice daily 60 tablet 0  . GELATIN PO Take 2,160 mg by mouth daily.     Marland Kitchen losartan (COZAAR) 100 MG tablet Take 1 tablet (100 mg total) by mouth daily. 90 tablet 1  . Omega-3 Fatty Acids (FISH OIL) 1000 MG CAPS Take 1,000 mg by mouth daily.    Marland Kitchen omeprazole (PRILOSEC) 40 MG capsule Take 1 capsule (40 mg total) by mouth daily. 90 capsule 1  . simvastatin (ZOCOR) 10 MG tablet Take 1 tablet (10 mg total) by mouth at bedtime. 90 tablet 1  . traMADol (ULTRAM) 50 MG tablet TABLET BY MOUTH EVERY 8 HOURS AS NEEDED 45 tablet 0  . [DISCONTINUED] oxybutynin (DITROPAN) 5 MG tablet Take 1 tablet (5 mg total) by mouth 2 (two) times daily. 60 tablet 2   No current facility-administered medications on file prior to visit.    BP 138/84 mmHg  Pulse 86  Temp(Src) 98 F (36.7 C) (Oral)  Resp 16  Ht 5\' 7"  (1.702 m)  Wt 241 lb (109.317 kg)  BMI 37.74 kg/m2  SpO2 99%  LMP 07/11/1991       Objective:   Physical Exam  Constitutional: She is oriented to person, place, and time. She appears well-developed and well-nourished. No distress.  HENT:  Head: Normocephalic and atraumatic.  Cardiovascular: Normal rate and regular rhythm.   No murmur heard. Pulmonary/Chest: Effort normal and breath sounds normal. No respiratory distress. She has no wheezes. She has no rales. She exhibits no tenderness.  Musculoskeletal:  2+ bilateral LE edema  Neurological: She is alert and oriented to person, place, and time.  Psychiatric: She has a normal mood and affect. Her behavior is normal. Judgment and thought content normal.          Assessment & Plan:

## 2014-07-17 NOTE — Patient Instructions (Addendum)
Decrease furosemide (lasix) to 20mg  once daily. Complete lab work prior to leaving.  Weigh yourself daily. Call if your weight goes above 245. Follow up in 6 weeks.

## 2014-07-20 ENCOUNTER — Encounter: Payer: Self-pay | Admitting: Family

## 2014-07-22 ENCOUNTER — Ambulatory Visit (HOSPITAL_BASED_OUTPATIENT_CLINIC_OR_DEPARTMENT_OTHER): Payer: Medicare Other

## 2014-07-23 ENCOUNTER — Other Ambulatory Visit: Payer: Self-pay | Admitting: Family Medicine

## 2014-07-23 ENCOUNTER — Encounter (HOSPITAL_COMMUNITY): Payer: Self-pay | Admitting: Orthopedic Surgery

## 2014-07-24 NOTE — Telephone Encounter (Signed)
Rx called to pharmacy voicemail. 

## 2014-07-24 NOTE — Telephone Encounter (Signed)
Requesting Tramadol 50mg -Take 1 tablet by mouth every 8 hours as needed. Last refill:05/27/14;#45,0 Last OV:07/17/14 No UDS Please advise.//AB/CMA

## 2014-07-24 NOTE — Telephone Encounter (Signed)
Ok to send 45 tabs zero refills.

## 2014-07-28 ENCOUNTER — Other Ambulatory Visit: Payer: Self-pay | Admitting: Family

## 2014-07-29 ENCOUNTER — Ambulatory Visit: Payer: Medicare Other | Admitting: Family

## 2014-07-29 NOTE — Telephone Encounter (Signed)
Verified with pharmacy that they received fax from 07/24/14 and rx is waiting for pt to pick up.  Message sent to pt via "mychart".

## 2014-07-29 NOTE — Telephone Encounter (Signed)
07/24/14 phone note says rx was called to the pharmacy. I think rx has to be faxed and I thought I faxed it to the pharmacy as well. Will confirm with pharmacy once they open.

## 2014-08-28 ENCOUNTER — Ambulatory Visit (INDEPENDENT_AMBULATORY_CARE_PROVIDER_SITE_OTHER): Payer: Medicare Other | Admitting: Family

## 2014-08-28 ENCOUNTER — Encounter: Payer: Self-pay | Admitting: Family

## 2014-08-28 VITALS — BP 152/80 | HR 71 | Temp 98.0°F | Resp 16 | Ht 67.0 in | Wt 242.6 lb

## 2014-08-28 DIAGNOSIS — M19041 Primary osteoarthritis, right hand: Secondary | ICD-10-CM | POA: Diagnosis not present

## 2014-08-28 DIAGNOSIS — E785 Hyperlipidemia, unspecified: Secondary | ICD-10-CM | POA: Diagnosis not present

## 2014-08-28 DIAGNOSIS — M19042 Primary osteoarthritis, left hand: Secondary | ICD-10-CM

## 2014-08-28 DIAGNOSIS — I509 Heart failure, unspecified: Secondary | ICD-10-CM

## 2014-08-28 DIAGNOSIS — I10 Essential (primary) hypertension: Secondary | ICD-10-CM

## 2014-08-28 MED ORDER — FUROSEMIDE 20 MG PO TABS: ORAL_TABLET | ORAL | Status: DC

## 2014-08-28 NOTE — Progress Notes (Signed)
Subjective:    Patient ID: [name], female    DOB: 04-20-1941, 74 y.o.   MRN: [id number]  HPI  [name] is a 74 yr old female who presents today for follow up.    OA- having pain in her knuckles.   Patient is currently maintained on the following medications for blood pressure: losartan 100mg  and on lasix.  Patient reports good compliance with blood pressure medications. Patient denies chest pain, shortness of breath. Last 3 blood pressure readings in our office are as follows: BP Readings from Last 3 Encounters:  07/17/14 138/84  07/14/14 146/88  06/24/14 126/82    Patient is currently maintained on the following medication for hyperlipidemia: simvastatin 10. Last lipid panel as follows:   Lab Results  Component Value Date   CHOL 166 06/24/2014   HDL 61.00 06/24/2014   LDLCALC 89 06/24/2014   LDLDIRECT 110.4 12/13/2006   TRIG 80.0 06/24/2014   CHOLHDL 3 06/24/2014  Patient denies myalgia. Patient reports good compliance with low fat/low cholesterol diet.   CHF-  Denies SOB,  Swelling remains improved.  Wt Readings from Last 3 Encounters:  08/28/14 242 lb 9.6 oz (110.043 kg)  07/17/14 241 lb (109.317 kg)  07/14/14 248 lb 12.8 oz (112.855 kg)    Review of Systems    see HPI  Past Medical History  Diagnosis Date  . Hypertension   . Obesity   . Allergic rhinitis   . 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 Osteopenia 11/26/2009  . Breast cancer     "right" (06/17/2012)  . Exertional dyspnea   . Thyroid disorder     "sees endocrinologist yearly" (06/17/2012)  . Iron deficiency anemia     "hematologist watches it" (06/17/2012)  . Osteoarthritis     severe right hip osteoarthritis-s/p hip replacement  . Arthritis     "knuckles" (06/17/2012)    History   Social History  . Marital Status: Married    Spouse Name: N/A  . Number of Children: N/A  . Years of Education: N/A   Occupational History  . Not on file.   Social History Main Topics  .  Smoking status: Former Smoker -- 0.50 packs/day for 10 years    Types: Cigarettes    Quit date: 07/14/1974  . Smokeless tobacco: Never Used     Comment: quit in 1976 smoked for approx. 10 years  . Alcohol Use: 4.2 oz/week    7 Glasses of wine per week     Comment: drinks wine with dinner  . Drug Use: No  . Sexual Activity: Not Currently   Other Topics Concern  . Not on file   Social History Narrative   Retired   The patient is married and has one child and two grandchildren that live in the area.     She drinks wine with dinner.  She quit smoking in 1976, smoked for   approximately 10 years.      Moved to [place] from [place]          Past Surgical History  Procedure Laterality Date  . Total hip arthroplasty  04/2006    right hip   . Mastectomy  1993?    bilateral mastectomy  . Tonsillectomy  1949  . Eye surgery  2011    cataract removal both eyes  . Total hip revision  06/17/2012    "right" (06/17/2012)  . Breast biopsy  ` 1992    "bilaterlly" (06/17/2012)  . Total hip revision  06/17/2012    Procedure: TOTAL HIP REVISION;  Surgeon: Kerin Salen, MD;  Location: [place];  Service: Orthopedics;  Laterality: Right;  . Hip closed reduction  07/26/2012    Procedure: CLOSED MANIPULATION HIP;  Surgeon: Nita Sells, MD;  Location: [facility];  Service: Orthopedics;  Laterality: Right;  . Fracture surgery Right 01/01/14    Has pins. Procedure performed at [facility]    Family History  Problem Relation Age of Onset  . Heart failure Father     deceased age 53  . Diabetes Father   . Alcohol abuse Father   . Breast cancer Mother     deceased at age 64 secondary to breast cancer    Allergies  Allergen Reactions  . Amlodipine     Swelling   . Sulfonamide Derivatives Swelling    REACTION: Swelling of the face; "eyes swelled shut"  . Chocolate Other (See Comments)    Sinus problems    Current Outpatient Prescriptions on File Prior to Visit    Medication Sig Dispense Refill  . bisoprolol (ZEBETA) 10 MG tablet Take 2 tablets (20 mg total) by mouth daily. 180 tablet 1  . Cholecalciferol (VITAMIN D) 2000 UNITS CAPS Take 4,000 Units by mouth daily.    . fexofenadine (ALLEGRA) 180 MG tablet Take 180 mg by mouth daily as needed. For seasonal allergies    . fluticasone (FLONASE) 50 MCG/ACT nasal spray Place 1 spray into both nostrils daily. 48 g 1  . GELATIN PO Take 2,160 mg by mouth daily.    Marland Kitchen losartan (COZAAR) 100 MG tablet Take 1 tablet (100 mg total) by mouth daily. 90 tablet 1  . Omega-3 Fatty Acids (FISH OIL) 1000 MG CAPS Take 1,000 mg by mouth daily.    Marland Kitchen omeprazole (PRILOSEC) 40 MG capsule Take 1 capsule (40 mg total) by mouth daily. 90 capsule 1  . simvastatin (ZOCOR) 10 MG tablet Take 1 tablet (10 mg total) by mouth at bedtime. 90 tablet 1  . traMADol (ULTRAM) 50 MG tablet TAKE 1 TABLET BY MOUTH EVERY 8 HOURS AS NEEDED 45 tablet 0   No current facility-administered medications on file prior to visit.    BP 152/80 mmHg  Pulse 71  Temp(Src) 98 F (36.7 C) (Oral)  Resp 16  Ht 5\' 7"  (1.702 m)  Wt 242 lb 9.6 oz (110.043 kg)  BMI 37.99 kg/m2  SpO2 99%  LMP 07/11/1991    Objective:   Physical Exam  Constitutional: She appears well-developed and well-nourished.  HENT:  Head: Normocephalic and atraumatic.  Cardiovascular: Normal rate, regular rhythm and normal heart sounds.   No murmur heard. Pulmonary/Chest: Effort normal and breath sounds normal. No respiratory distress. She has no wheezes.  Musculoskeletal:  1+ bilateral LE edema  Psychiatric: She has a normal mood and affect. Her behavior is normal. Judgment and thought content normal.          Assessment & Plan:

## 2014-08-28 NOTE — Patient Instructions (Signed)
Follow up in 3 months

## 2014-08-28 NOTE — Progress Notes (Signed)
Pre visit review using our clinic review tool, if applicable. No additional management support is needed unless otherwise documented below in the visit note. 

## 2014-08-29 DIAGNOSIS — M19042 Primary osteoarthritis, left hand: Secondary | ICD-10-CM

## 2014-08-29 DIAGNOSIS — M19041 Primary osteoarthritis, right hand: Secondary | ICD-10-CM | POA: Insufficient documentation

## 2014-08-29 NOTE — Assessment & Plan Note (Signed)
Stable on simvastatin, continue same.  

## 2014-08-29 NOTE — Assessment & Plan Note (Signed)
Stable on current meds, continue same.  

## 2014-08-29 NOTE — Assessment & Plan Note (Signed)
Recommended prn use of tylenol.

## 2014-08-29 NOTE — Assessment & Plan Note (Signed)
Clinically euvolemic, continue lasix.

## 2014-09-12 ENCOUNTER — Other Ambulatory Visit: Payer: Self-pay | Admitting: Family

## 2014-09-15 MED ORDER — TRAMADOL HCL 50 MG PO TABS: 50.0000 mg | ORAL_TABLET | Freq: Three times a day (TID) | ORAL | Status: DC | PRN

## 2014-09-15 NOTE — Telephone Encounter (Signed)
Last tramadol Rx given 07/24/14, #45 x no refills. Rx called to pharmacy voicemail.

## 2014-11-02 ENCOUNTER — Telehealth: Payer: Self-pay | Admitting: Family

## 2014-11-02 MED ORDER — TRAMADOL HCL 50 MG PO TABS: 50.0000 mg | ORAL_TABLET | Freq: Three times a day (TID) | ORAL | Status: DC | PRN

## 2014-11-02 NOTE — Telephone Encounter (Signed)
Rx faxed to pharmacy at 2:42pm.

## 2014-11-02 NOTE — Addendum Note (Signed)
Addended by: Kelle Darting A on: 11/02/2014 01:10 PM   Modules accepted: Orders

## 2014-11-02 NOTE — Telephone Encounter (Signed)
Rx printed and forwarded to Provider for signature.

## 2014-11-13 ENCOUNTER — Encounter: Payer: Self-pay | Admitting: Internal Medicine

## 2014-12-04 ENCOUNTER — Ambulatory Visit: Payer: Medicare Other | Admitting: Family

## 2014-12-09 ENCOUNTER — Ambulatory Visit (HOSPITAL_BASED_OUTPATIENT_CLINIC_OR_DEPARTMENT_OTHER)
Admission: RE | Admit: 2014-12-09 | Discharge: 2014-12-09 | Disposition: A | Discharge status: Home / Self Care | Payer: Medicare Other | Source: Ambulatory Visit | Attending: Internal Medicine | Admitting: Internal Medicine

## 2014-12-09 ENCOUNTER — Ambulatory Visit (INDEPENDENT_AMBULATORY_CARE_PROVIDER_SITE_OTHER): Payer: Medicare Other | Admitting: Internal Medicine

## 2014-12-09 ENCOUNTER — Telehealth: Payer: Self-pay | Admitting: *Deleted

## 2014-12-09 ENCOUNTER — Encounter: Payer: Self-pay | Admitting: Internal Medicine

## 2014-12-09 VITALS — BP 166/92 | HR 75 | Temp 98.0°F | Ht 67.0 in | Wt 251.5 lb

## 2014-12-09 DIAGNOSIS — R7309 Other abnormal glucose: Secondary | ICD-10-CM

## 2014-12-09 DIAGNOSIS — I509 Heart failure, unspecified: Secondary | ICD-10-CM

## 2014-12-09 DIAGNOSIS — J811 Chronic pulmonary edema: Secondary | ICD-10-CM | POA: Insufficient documentation

## 2014-12-09 DIAGNOSIS — J449 Chronic obstructive pulmonary disease, unspecified: Secondary | ICD-10-CM | POA: Insufficient documentation

## 2014-12-09 DIAGNOSIS — R7303 Prediabetes: Secondary | ICD-10-CM

## 2014-12-09 DIAGNOSIS — R0602 Shortness of breath: Secondary | ICD-10-CM | POA: Diagnosis not present

## 2014-12-09 MED ORDER — FUROSEMIDE 20 MG PO TABS: 40.0000 mg | ORAL_TABLET | Freq: Every day | ORAL | Status: DC

## 2014-12-09 NOTE — Progress Notes (Signed)
Subjective:    Patient ID: [name], female    DOB: 28-Nov-1940, 74 y.o.   MRN: [id number]  DOS:  12/09/2014 Type of visit - description : acute Interval history: For the last 10 days has noted her BP to be elevated in the 170-150/70-106 range despite taking her medications as prescribed. Also, her hands are swollen and slightly puffy and has a difficult time closing her fingers. She has chronic dyspnea on exertion and that has increased over the last few days.  She carries a history of CHF, hypertension and diabetes and is a former smoker.  Review of Systems Denies fever chills No chest pain or palpitations. No nausea, vomiting, blood in the stools. No cough or sputum production. No recent airplane trips or prolonged car trips No dysuria or gross hematuria.   Past Medical History  Diagnosis Date  . Hypertension   . Obesity   . Allergic rhinitis   . 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 Osteopenia 11/26/2009  . Breast cancer     "right" (06/17/2012)  . Exertional dyspnea   . Thyroid disorder     "sees endocrinologist yearly" (06/17/2012)  . Iron deficiency anemia     "hematologist watches it" (06/17/2012)  . Osteoarthritis     severe right hip osteoarthritis-s/p hip replacement  . Arthritis     "knuckles" (06/17/2012)    Past Surgical History  Procedure Laterality Date  . Total hip arthroplasty  04/2006    right hip   . Mastectomy  1993?    bilateral mastectomy  . Tonsillectomy  1949  . Eye surgery  2011    cataract removal both eyes  . Total hip revision  06/17/2012    "right" (06/17/2012)  . Breast biopsy  ` 1992    "bilaterlly" (06/17/2012)  . Total hip revision  06/17/2012    Procedure: TOTAL HIP REVISION;  Surgeon: Kerin Salen, MD;  Location: [facility];  Service: Orthopedics;  Laterality: Right;  . Hip closed reduction  07/26/2012    Procedure: CLOSED MANIPULATION HIP;  Surgeon: Nita Sells, MD;  Location: [facility];  Service: Orthopedics;   Laterality: Right;  . Fracture surgery Right 01/01/14    Has pins. Procedure performed at [facility]    History   Social History  . Marital Status: Married    Spouse Name: N/A  . Number of Children: N/A  . Years of Education: N/A   Occupational History  . Not on file.   Social History Main Topics  . Smoking status: Former Smoker -- 0.50 packs/day for 10 years    Types: Cigarettes    Quit date: 07/14/1974  . Smokeless tobacco: Never Used     Comment: quit in 1976 smoked for approx. 10 years  . Alcohol Use: 4.2 oz/week    7 Glasses of wine per week     Comment: drinks wine with dinner  . Drug Use: No  . Sexual Activity: Not Currently   Other Topics Concern  . Not on file   Social History Narrative   Retired   The patient is married and has one child and two grandchildren that live in the area.     She drinks wine with dinner.  She quit smoking in 1976, smoked for   approximately 10 years.      Moved to [place] from [place], [place]              Medication List       This list is  accurate as of: 12/09/14 11:59 PM.  Always use your most recent med list.               bisoprolol 10 MG tablet  Commonly known as:  ZEBETA  Take 2 tablets (20 mg total) by mouth daily.     fexofenadine 180 MG tablet  Commonly known as:  ALLEGRA  Take 180 mg by mouth daily as needed. For seasonal allergies     Fish Oil 1000 MG Caps  Take 1,000 mg by mouth daily.     fluticasone 50 MCG/ACT nasal spray  Commonly known as:  FLONASE  Place 1 spray into both nostrils daily.     furosemide 20 MG tablet  Commonly known as:  LASIX  Take 40 mg by mouth.     GELATIN PO  Take 2,160 mg by mouth daily.     losartan 100 MG tablet  Commonly known as:  COZAAR  Take 1 tablet (100 mg total) by mouth daily.     omeprazole 40 MG capsule  Commonly known as:  PRILOSEC  Take 1 capsule (40 mg total) by mouth daily.     simvastatin 10 MG tablet  Commonly known as:  ZOCOR  Take 1  tablet (10 mg total) by mouth at bedtime.     traMADol 50 MG tablet  Commonly known as:  ULTRAM  Take 1 tablet (50 mg total) by mouth every 8 (eight) hours as needed.     Vitamin D 2000 UNITS Caps  Take 4,000 Units by mouth daily.           Objective:   Physical Exam BP 166/92 mmHg  Pulse 75  Temp(Src) 98 F (36.7 C) (Oral)  Ht 5\' 7"  (1.702 m)  Wt 251 lb 8 oz (114.08 kg)  BMI 39.38 kg/m2  SpO2 98%  LMP 07/11/1991 General:   Well developed, well nourished . NAD.  HEENT:  Normocephalic . Face symmetric, atraumatic. Upper eyelids look slightly puffy, according to the patient does not her usual appearance. Neck: No JVD at 45 Lungs:  CTA B Normal respiratory effort, no intercostal retractions, no accessory muscle use. Heart: RRR,  no murmur.  Trace  pretibial edema bilaterally ; has well fitted compression stockings MSK: Hands don look puffy, in a global distribution, no actual synovitis, redness but all small hand joints are slightly TTP Skin: Not pale. Not jaundice Neurologic:  alert & oriented X3.  Speech normal, gait appropriate for age and unassisted Psych--  Cognition and judgment appear intact.  Cooperative with normal attention span and concentration.  Behavior appropriate. No anxious or depressed appearing.       Assessment & Plan:    CHF, hypertension 74 year old lady with history of CHF and hypertension presents with increased dyspnea on exertion, has gained approximately 9 pounds since last visit 08/2014,has  hand puffiness. Chart is reviewed, he was diagnosed with CHF back in 07/14/2014 when she complained of swelling; amlodipine was discontinued and she started Lasix. Echocardiogram at that time was essentially negative, symptoms resolved. EKG today showed no ischemic changes, sinus rhythm, no change compared to the EKG in 2013, I was unable to open the EKG from January 2016. Plan: BNP, CMP, CBC, TSH Chest x-ray to check volume ER if symptoms  severe Increase Lasix 20 mg to 3 tablets for 2 days, then 2 tablets daily Return to clinic PCP in 1 week  Diabetes, check a A1c

## 2014-12-09 NOTE — Telephone Encounter (Signed)
Noted, thx.

## 2014-12-09 NOTE — Telephone Encounter (Signed)
FYI- Patient scheduled office visit through MyChart(in error under husband's name) stating:   "I was going to wait for 6/21 but my hands and feet are swelling terribly and my blood pressure is very high. My hands hurt and I have no strength. I cannot make a fist because of the swelling. I am worried because of my blood pressure."  Patient reports that her BP is 176/106 today despite taking her medications.  She denies shortness of breath or chest pain, but states that the swelling has been going on for a week and a half and is getting worse.    Moved appointment to today with Dr. Larose Kells at 2:45.

## 2014-12-09 NOTE — Patient Instructions (Addendum)
Get your blood work before you leave   Stop by the first floor and get the XR   Lasix 20 mg: Take 3 tablets daily for the next couple of days Then take 2 tablets every day  ER if chest pain, increased difficulty breathing.  Come back and see your primary provider in one week.

## 2014-12-09 NOTE — Progress Notes (Signed)
Pre visit review using our clinic review tool, if applicable. No additional management support is needed unless otherwise documented below in the visit note. 

## 2014-12-10 LAB — CBC WITH DIFFERENTIAL/PLATELET
BASOS PCT: 1.5 % (ref 0.0–3.0)
Basophils Absolute: 0.2 10*3/uL — ABNORMAL HIGH (ref 0.0–0.1)
Eosinophils Absolute: 0.1 10*3/uL (ref 0.0–0.7)
Eosinophils Relative: 1.1 % (ref 0.0–5.0)
HCT: 33.5 % — ABNORMAL LOW (ref 36.0–46.0)
HEMOGLOBIN: 11.2 g/dL — AB (ref 12.0–15.0)
LYMPHS ABS: 2.4 10*3/uL (ref 0.7–4.0)
LYMPHS PCT: 20.7 % (ref 12.0–46.0)
MCHC: 33.5 g/dL (ref 30.0–36.0)
MCV: 92.8 fl (ref 78.0–100.0)
Monocytes Absolute: 0.7 10*3/uL (ref 0.1–1.0)
Monocytes Relative: 6.3 % (ref 3.0–12.0)
Neutro Abs: 8 10*3/uL — ABNORMAL HIGH (ref 1.4–7.7)
Neutrophils Relative %: 70.4 % (ref 43.0–77.0)
Platelets: 288 10*3/uL (ref 150.0–400.0)
RBC: 3.61 Mil/uL — ABNORMAL LOW (ref 3.87–5.11)
RDW: 14.6 % (ref 11.5–15.5)
WBC: 11.4 10*3/uL — AB (ref 4.0–10.5)

## 2014-12-10 LAB — COMPREHENSIVE METABOLIC PANEL
ALBUMIN: 3.8 g/dL (ref 3.5–5.2)
ALK PHOS: 98 U/L (ref 39–117)
ALT: 30 U/L (ref 0–35)
AST: 48 U/L — ABNORMAL HIGH (ref 0–37)
BUN: 16 mg/dL (ref 6–23)
CALCIUM: 9.8 mg/dL (ref 8.4–10.5)
CO2: 24 meq/L (ref 19–32)
CREATININE: 0.92 mg/dL (ref 0.40–1.20)
Chloride: 104 mEq/L (ref 96–112)
GFR: 63.83 mL/min (ref >60.00)
Glucose, Bld: 98 mg/dL (ref 70–99)
Potassium: 4.1 mEq/L (ref 3.5–5.1)
SODIUM: 135 meq/L (ref 135–145)
Total Bilirubin: 1.7 mg/dL — ABNORMAL HIGH (ref 0.2–1.2)
Total Protein: 6.5 g/dL (ref 6.0–8.3)

## 2014-12-10 LAB — BRAIN NATRIURETIC PEPTIDE: Pro B Natriuretic peptide (BNP): 780 pg/mL — ABNORMAL HIGH (ref 0.0–100.0)

## 2014-12-10 LAB — HEMOGLOBIN A1C: HEMOGLOBIN A1C: 5.6 % (ref 4.6–6.5)

## 2014-12-10 LAB — TSH: TSH: 1.71 u[IU]/mL (ref 0.35–4.50)

## 2014-12-11 ENCOUNTER — Telehealth: Payer: Self-pay | Admitting: Family

## 2014-12-11 DIAGNOSIS — M531 Cervicobrachial syndrome: Secondary | ICD-10-CM | POA: Diagnosis not present

## 2014-12-11 DIAGNOSIS — M545 Low back pain: Secondary | ICD-10-CM | POA: Diagnosis not present

## 2014-12-11 DIAGNOSIS — M9901 Segmental and somatic dysfunction of cervical region: Secondary | ICD-10-CM | POA: Diagnosis not present

## 2014-12-11 DIAGNOSIS — M9903 Segmental and somatic dysfunction of lumbar region: Secondary | ICD-10-CM | POA: Diagnosis not present

## 2014-12-11 MED ORDER — LOSARTAN POTASSIUM 100 MG PO TABS
100.0000 mg | ORAL_TABLET | Freq: Every day | ORAL | Status: DC
Start: 2014-12-11 — End: 2015-01-20

## 2014-12-11 NOTE — Telephone Encounter (Signed)
Notified patient.

## 2014-12-11 NOTE — Telephone Encounter (Signed)
Rx sent to local pharmacy.  

## 2014-12-11 NOTE — Telephone Encounter (Signed)
Caller name: Shadaya Relation to pt: self Call back number: [phone] Pharmacy:  [facility] in [place] [address]  Reason for call:   Needs an emergency supply of losartan sent to local pharmacy. She has one pill left and is waiting on mail order rx to come in.

## 2014-12-14 DIAGNOSIS — M9901 Segmental and somatic dysfunction of cervical region: Secondary | ICD-10-CM | POA: Diagnosis not present

## 2014-12-14 DIAGNOSIS — M531 Cervicobrachial syndrome: Secondary | ICD-10-CM | POA: Diagnosis not present

## 2014-12-14 DIAGNOSIS — M545 Low back pain: Secondary | ICD-10-CM | POA: Diagnosis not present

## 2014-12-14 DIAGNOSIS — M9903 Segmental and somatic dysfunction of lumbar region: Secondary | ICD-10-CM | POA: Diagnosis not present

## 2014-12-16 ENCOUNTER — Ambulatory Visit (INDEPENDENT_AMBULATORY_CARE_PROVIDER_SITE_OTHER): Payer: Medicare Other | Admitting: Family

## 2014-12-16 ENCOUNTER — Encounter: Payer: Self-pay | Admitting: Family

## 2014-12-16 VITALS — BP 168/100 | HR 70 | Temp 97.9°F | Resp 16 | Ht 67.0 in | Wt 244.8 lb

## 2014-12-16 DIAGNOSIS — I10 Essential (primary) hypertension: Secondary | ICD-10-CM

## 2014-12-16 DIAGNOSIS — D72829 Elevated white blood cell count, unspecified: Secondary | ICD-10-CM | POA: Insufficient documentation

## 2014-12-16 DIAGNOSIS — M545 Low back pain: Secondary | ICD-10-CM | POA: Diagnosis not present

## 2014-12-16 DIAGNOSIS — I509 Heart failure, unspecified: Secondary | ICD-10-CM

## 2014-12-16 DIAGNOSIS — M9901 Segmental and somatic dysfunction of cervical region: Secondary | ICD-10-CM | POA: Diagnosis not present

## 2014-12-16 DIAGNOSIS — M531 Cervicobrachial syndrome: Secondary | ICD-10-CM | POA: Diagnosis not present

## 2014-12-16 DIAGNOSIS — Z79891 Long term (current) use of opiate analgesic: Secondary | ICD-10-CM | POA: Diagnosis not present

## 2014-12-16 DIAGNOSIS — M9903 Segmental and somatic dysfunction of lumbar region: Secondary | ICD-10-CM | POA: Diagnosis not present

## 2014-12-16 LAB — CBC WITH DIFFERENTIAL/PLATELET
BASOS ABS: 0.1 10*3/uL (ref 0.0–0.1)
Basophils Relative: 0.5 % (ref 0.0–3.0)
EOS PCT: 0.8 % (ref 0.0–5.0)
Eosinophils Absolute: 0.1 10*3/uL (ref 0.0–0.7)
HEMATOCRIT: 33.9 % — AB (ref 36.0–46.0)
Hemoglobin: 11.5 g/dL — ABNORMAL LOW (ref 12.0–15.0)
LYMPHS ABS: 1.8 10*3/uL (ref 0.7–4.0)
LYMPHS PCT: 17.2 % (ref 12.0–46.0)
MCHC: 33.8 g/dL (ref 30.0–36.0)
MCV: 91.6 fl (ref 78.0–100.0)
Monocytes Absolute: 0.7 10*3/uL (ref 0.1–1.0)
Monocytes Relative: 6.6 % (ref 3.0–12.0)
NEUTROS PCT: 74.9 % (ref 43.0–77.0)
Neutro Abs: 8 10*3/uL — ABNORMAL HIGH (ref 1.4–7.7)
Platelets: 319 10*3/uL (ref 150.0–400.0)
RBC: 3.7 Mil/uL — AB (ref 3.87–5.11)
RDW: 14.6 % (ref 11.5–15.5)
WBC: 10.7 10*3/uL — AB (ref 4.0–10.5)

## 2014-12-16 LAB — BASIC METABOLIC PANEL
BUN: 14 mg/dL (ref 6–23)
CHLORIDE: 103 meq/L (ref 96–112)
CO2: 27 meq/L (ref 19–32)
CREATININE: 0.83 mg/dL (ref 0.40–1.20)
Calcium: 10 mg/dL (ref 8.4–10.5)
GFR: 71.87 mL/min (ref >60.00)
GLUCOSE: 107 mg/dL — AB (ref 70–99)
Potassium: 3.5 mEq/L (ref 3.5–5.1)
Sodium: 136 mEq/L (ref 135–145)

## 2014-12-16 NOTE — Assessment & Plan Note (Signed)
Improving but still appears volume overloaded.  Rec that pt weight daily, continue lasix 40mg  in the AM and add 20mg  in the PM until she follows back up in the office in 1 week. Will obtain bmet.

## 2014-12-16 NOTE — Progress Notes (Signed)
Subjective:    Patient ID: [name], female    DOB: June 24, 1941, 74 y.o.   MRN: [id number]  HPI  [name] is a 74 yr old female who presents today for follow up of CHF.  She was seen last week by Dr. Larose Kells with complaint of swelling and DOE. Her furosemide was increased from 40mg  daily to 60mg  daily for 3 days, then she returned to 40mg  once daily.   Reports BP remains elevated. Hands still swollen.  She reports that her symptoms have improved some but she is still feeling swollen.    BP Readings from Last 3 Encounters:  12/16/14 168/100  12/09/14 166/92  08/28/14 152/80    Wt Readings from Last 3 Encounters:  12/16/14 244 lb 12.8 oz (111.041 kg)  12/09/14 251 lb 8 oz (114.08 kg)  08/28/14 242 lb 9.6 oz (110.043 kg)    Review of Systems See HPI  Past Medical History  Diagnosis Date  . Hypertension   . Obesity   . Allergic rhinitis   . 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 Osteopenia 11/26/2009  . Breast cancer     "right" (06/17/2012)  . Exertional dyspnea   . Thyroid disorder     "sees endocrinologist yearly" (06/17/2012)  . Iron deficiency anemia     "hematologist watches it" (06/17/2012)  . Osteoarthritis     severe right hip osteoarthritis-s/p hip replacement  . Arthritis     "knuckles" (06/17/2012)    History   Social History  . Marital Status: Married    Spouse Name: N/A  . Number of Children: N/A  . Years of Education: N/A   Occupational History  . Not on file.   Social History Main Topics  . Smoking status: Former Smoker -- 0.50 packs/day for 10 years    Types: Cigarettes    Quit date: 07/14/1974  . Smokeless tobacco: Never Used     Comment: quit in 1976 smoked for approx. 10 years  . Alcohol Use: 4.2 oz/week    7 Glasses of wine per week     Comment: drinks wine with dinner  . Drug Use: No  . Sexual Activity: Not Currently   Other Topics Concern  . Not on file   Social History Narrative   Retired   The patient is married and  has one child and two grandchildren that live in the area.     She drinks wine with dinner.  She quit smoking in 1976, smoked for   approximately 10 years.      Moved to [place] from [place]          Past Surgical History  Procedure Laterality Date  . Total hip arthroplasty  04/2006    right hip   . Mastectomy  1993?    bilateral mastectomy  . Tonsillectomy  1949  . Eye surgery  2011    cataract removal both eyes  . Total hip revision  06/17/2012    "right" (06/17/2012)  . Breast biopsy  ` 1992    "bilaterlly" (06/17/2012)  . Total hip revision  06/17/2012    Procedure: TOTAL HIP REVISION;  Surgeon: Kerin Salen, MD;  Location: [facility];  Service: Orthopedics;  Laterality: Right;  . Hip closed reduction  07/26/2012    Procedure: CLOSED MANIPULATION HIP;  Surgeon: Nita Sells, MD;  Location: [facility];  Service: Orthopedics;  Laterality: Right;  . Fracture surgery Right 01/01/14    Has pins. Procedure performed at [facility]  Family History  Problem Relation Age of Onset  . Heart failure Father     deceased age 16  . Diabetes Father   . Alcohol abuse Father   . Breast cancer Mother     deceased at age 55 secondary to breast cancer    Allergies  Allergen Reactions  . Amlodipine     Swelling   . Sulfonamide Derivatives Swelling    REACTION: Swelling of the face; "eyes swelled shut"  . Chocolate Other (See Comments)    Sinus problems    Current Outpatient Prescriptions on File Prior to Visit  Medication Sig Dispense Refill  . bisoprolol (ZEBETA) 10 MG tablet Take 2 tablets (20 mg total) by mouth daily. 180 tablet 1  . Cholecalciferol (VITAMIN D) 2000 UNITS CAPS Take 4,000 Units by mouth daily.    . fexofenadine (ALLEGRA) 180 MG tablet Take 180 mg by mouth daily as needed. For seasonal allergies    . fluticasone (FLONASE) 50 MCG/ACT nasal spray Place 1 spray into both nostrils daily. 48 g 1  . furosemide (LASIX) 20 MG tablet Take 40 mg by mouth.      Marland Kitchen GELATIN PO Take 2,160 mg by mouth daily.    Marland Kitchen losartan (COZAAR) 100 MG tablet Take 1 tablet (100 mg total) by mouth daily. 30 tablet 0  . Omega-3 Fatty Acids (FISH OIL) 1000 MG CAPS Take 1,000 mg by mouth daily.    Marland Kitchen omeprazole (PRILOSEC) 40 MG capsule Take 1 capsule (40 mg total) by mouth daily. 90 capsule 1  . simvastatin (ZOCOR) 10 MG tablet Take 1 tablet (10 mg total) by mouth at bedtime. 90 tablet 1  . traMADol (ULTRAM) 50 MG tablet Take 1 tablet (50 mg total) by mouth every 8 (eight) hours as needed. 45 tablet 0   No current facility-administered medications on file prior to visit.    BP 168/100 mmHg  Pulse 70  Temp(Src) 97.9 F (36.6 C) (Oral)  Resp 16  Ht 5\' 7"  (1.702 m)  Wt 244 lb 12.8 oz (111.041 kg)  BMI 38.33 kg/m2  SpO2 99%  LMP 07/11/1991       Objective:   Physical Exam  Constitutional: She appears well-developed and well-nourished.  Cardiovascular: Normal rate, regular rhythm and normal heart sounds.   No murmur heard. + facial/periorbital swelling, + swelling of hands noted. Bilateral LE edema is 3-4+   Pulmonary/Chest: Effort normal and breath sounds normal. No respiratory distress. She has no wheezes.  Psychiatric: She has a normal mood and affect. Her behavior is normal. Judgment and thought content normal.          Assessment & Plan:

## 2014-12-16 NOTE — Patient Instructions (Addendum)
Please complete lab work prior to leaving. Change lasix dosing to 40mg  in the morning and 20mg  in the afternoon. Call office: Anytime you have any of the following symptoms: 1) 3 pound weight gain in 24 hours or 5 pounds in 1 week 2) shortness of breath, with or without a dry hacking cough 3) swelling in the hands, feet or stomach 4) if you have to sleep on extra pillows at night in order to breathe.  Follow up in 1 week.

## 2014-12-16 NOTE — Assessment & Plan Note (Signed)
Uncontrolled. Will see how her BP looks after increase of lasix.

## 2014-12-16 NOTE — Assessment & Plan Note (Signed)
Noted on most recent CBC and on CBC from 2 years back.  Will repeat today. No obvious infectious etiology.

## 2014-12-17 ENCOUNTER — Telehealth: Payer: Self-pay | Admitting: Family

## 2014-12-17 NOTE — Telephone Encounter (Signed)
Opened in error

## 2014-12-18 ENCOUNTER — Telehealth: Payer: Self-pay | Admitting: Family

## 2014-12-18 MED ORDER — TRAMADOL HCL 50 MG PO TABS: 50.0000 mg | ORAL_TABLET | Freq: Three times a day (TID) | ORAL | Status: DC | PRN

## 2014-12-18 NOTE — Telephone Encounter (Signed)
UDS given 12/16/14.  Rx printed and forwarded to Provider for signature.

## 2014-12-18 NOTE — Telephone Encounter (Signed)
Rx faxed to pharmacy  

## 2014-12-21 DIAGNOSIS — M9901 Segmental and somatic dysfunction of cervical region: Secondary | ICD-10-CM | POA: Diagnosis not present

## 2014-12-21 DIAGNOSIS — M531 Cervicobrachial syndrome: Secondary | ICD-10-CM | POA: Diagnosis not present

## 2014-12-21 DIAGNOSIS — M545 Low back pain: Secondary | ICD-10-CM | POA: Diagnosis not present

## 2014-12-21 DIAGNOSIS — M9903 Segmental and somatic dysfunction of lumbar region: Secondary | ICD-10-CM | POA: Diagnosis not present

## 2014-12-25 ENCOUNTER — Ambulatory Visit: Payer: Medicare Other | Admitting: Family

## 2014-12-28 DIAGNOSIS — M9903 Segmental and somatic dysfunction of lumbar region: Secondary | ICD-10-CM | POA: Diagnosis not present

## 2014-12-28 DIAGNOSIS — M9901 Segmental and somatic dysfunction of cervical region: Secondary | ICD-10-CM | POA: Diagnosis not present

## 2014-12-28 DIAGNOSIS — M531 Cervicobrachial syndrome: Secondary | ICD-10-CM | POA: Diagnosis not present

## 2014-12-28 DIAGNOSIS — M545 Low back pain: Secondary | ICD-10-CM | POA: Diagnosis not present

## 2014-12-29 ENCOUNTER — Telehealth: Payer: Self-pay | Admitting: Family

## 2014-12-29 ENCOUNTER — Encounter: Payer: Self-pay | Admitting: Family

## 2014-12-29 ENCOUNTER — Ambulatory Visit (INDEPENDENT_AMBULATORY_CARE_PROVIDER_SITE_OTHER): Payer: Medicare Other | Admitting: Family

## 2014-12-29 ENCOUNTER — Ambulatory Visit: Payer: Medicare Other | Admitting: Family

## 2014-12-29 ENCOUNTER — Other Ambulatory Visit: Payer: Self-pay | Admitting: Family

## 2014-12-29 VITALS — BP 162/90 | HR 60 | Temp 97.7°F | Resp 18 | Ht 67.0 in | Wt 236.6 lb

## 2014-12-29 DIAGNOSIS — I509 Heart failure, unspecified: Secondary | ICD-10-CM

## 2014-12-29 DIAGNOSIS — I10 Essential (primary) hypertension: Secondary | ICD-10-CM | POA: Diagnosis not present

## 2014-12-29 LAB — BASIC METABOLIC PANEL
BUN: 17 mg/dL (ref 6–23)
CALCIUM: 10.2 mg/dL (ref 8.4–10.5)
CHLORIDE: 105 meq/L (ref 96–112)
CO2: 24 mEq/L (ref 19–32)
Creatinine, Ser: 0.78 mg/dL (ref 0.40–1.20)
GFR: 77.21 mL/min (ref >60.00)
Glucose, Bld: 96 mg/dL (ref 70–99)
POTASSIUM: 3.2 meq/L — AB (ref 3.5–5.1)
Sodium: 138 mEq/L (ref 135–145)

## 2014-12-29 MED ORDER — POTASSIUM CHLORIDE CRYS ER 20 MEQ PO TBCR: 20.0000 meq | EXTENDED_RELEASE_TABLET | Freq: Every day | ORAL | Status: DC

## 2014-12-29 MED ORDER — FUROSEMIDE 20 MG PO TABS: ORAL_TABLET | ORAL | Status: DC

## 2014-12-29 MED ORDER — CLONIDINE HCL 0.1 MG PO TABS: 0.1000 mg | ORAL_TABLET | Freq: Two times a day (BID) | ORAL | Status: DC

## 2014-12-29 NOTE — Progress Notes (Signed)
Subjective:    Patient ID: [name], female    DOB: [birth year], 74 y.o.   MRN: [id number]  HPI   [name] is a 74 yr old female who presents today for 1 week follow up of her CHF. Last visit she was noted to still be somewhat volume overloaded. She was continued on increased dose of lasix (40mg  in AM and 20mg  in PM).  She has lost 8 pounds since her last visit and notes improvement in her edema. Denies orthopnea. SOB is improved.     Wt Readings from Last 3 Encounters:  12/29/14 236 lb 9.6 oz (107.321 kg)  12/16/14 244 lb 12.8 oz (111.041 kg)  12/09/14 251 lb 8 oz (114.08 kg)   BP Readings from Last 3 Encounters:  12/29/14 162/90  12/16/14 168/100  12/09/14 166/92      Review of Systems Past Medical History  Diagnosis Date  . Hypertension   . Obesity   . Allergic rhinitis   . 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 Osteopenia 11/26/2009  . Breast cancer     "right" (06/17/2012)  . Exertional dyspnea   . Thyroid disorder     "sees endocrinologist yearly" (06/17/2012)  . Iron deficiency anemia     "hematologist watches it" (06/17/2012)  . Osteoarthritis     severe right hip osteoarthritis-s/p hip replacement  . Arthritis     "knuckles" (06/17/2012)    History   Social History  . Marital Status: Married    Spouse Name: N/A  . Number of Children: N/A  . Years of Education: N/A   Occupational History  . Not on file.   Social History Main Topics  . Smoking status: Former Smoker -- 0.50 packs/day for 10 years    Types: Cigarettes    Quit date: 07/14/1974  . Smokeless tobacco: Never Used     Comment: quit in 1976 smoked for approx. 10 years  . Alcohol Use: 4.2 oz/week    7 Glasses of wine per week     Comment: drinks wine with dinner  . Drug Use: No  . Sexual Activity: Not Currently   Other Topics Concern  . Not on file   Social History Narrative   Retired   The patient is married and has one child and two grandchildren that live in the area.       She drinks wine with dinner.  She quit smoking in 1976, smoked for   approximately 10 years.      Moved to [place] from [place]          Past Surgical History  Procedure Laterality Date  . Total hip arthroplasty  04/2006    right hip   . Mastectomy  1993?    bilateral mastectomy  . Tonsillectomy  1949  . Eye surgery  2011    cataract removal both eyes  . Total hip revision  06/17/2012    "right" (06/17/2012)  . Breast biopsy  ` 1992    "bilaterlly" (06/17/2012)  . Total hip revision  06/17/2012    Procedure: TOTAL HIP REVISION;  Surgeon: Kerin Salen, MD;  Location: [facility];  Service: Orthopedics;  Laterality: Right;  . Hip closed reduction  07/26/2012    Procedure: CLOSED MANIPULATION HIP;  Surgeon: Nita Sells, MD;  Location: [facility];  Service: Orthopedics;  Laterality: Right;  . Fracture surgery Right 01/01/14    Has pins. Procedure performed at [facility] History  Problem Relation  Age of Onset  . Heart failure Father     deceased age 63  . Diabetes Father   . Alcohol abuse Father   . Breast cancer Mother     deceased at age 22 secondary to breast cancer    Allergies  Allergen Reactions  . Amlodipine     Swelling   . Sulfonamide Derivatives Swelling    REACTION: Swelling of the face; "eyes swelled shut"  . Chocolate Other (See Comments)    Sinus problems    Current Outpatient Prescriptions on File Prior to Visit  Medication Sig Dispense Refill  . bisoprolol (ZEBETA) 10 MG tablet Take 2 tablets (20 mg total) by mouth daily. 180 tablet 1  . Cholecalciferol (VITAMIN D) 2000 UNITS CAPS Take 4,000 Units by mouth daily.    . fexofenadine (ALLEGRA) 180 MG tablet Take 180 mg by mouth daily as needed. For seasonal allergies    . fluticasone (FLONASE) 50 MCG/ACT nasal spray Place 1 spray into both nostrils daily. 48 g 1  . furosemide (LASIX) 20 MG tablet 2 tabs by mouth in the AM and 1 tab in the afteroon    . GELATIN PO Take 2,160 mg  by mouth daily.    Marland Kitchen losartan (COZAAR) 100 MG tablet Take 1 tablet (100 mg total) by mouth daily. 30 tablet 0  . Omega-3 Fatty Acids (FISH OIL) 1000 MG CAPS Take 1,000 mg by mouth daily.    Marland Kitchen omeprazole (PRILOSEC) 40 MG capsule Take 1 capsule (40 mg total) by mouth daily. 90 capsule 1  . simvastatin (ZOCOR) 10 MG tablet Take 1 tablet (10 mg total) by mouth at bedtime. 90 tablet 1  . traMADol (ULTRAM) 50 MG tablet Take 1 tablet (50 mg total) by mouth every 8 (eight) hours as needed. 45 tablet 0   No current facility-administered medications on file prior to visit.    BP 162/90 mmHg  Pulse 60  Temp(Src) 97.7 F (36.5 C) (Oral)  Resp 18  Ht 5\' 7"  (1.702 m)  Wt 236 lb 9.6 oz (107.321 kg)  BMI 37.05 kg/m2  SpO2 98%  LMP 07/11/1991       Objective:   Physical Exam  Constitutional: She is oriented to person, place, and time. She appears well-developed and well-nourished.  HENT:  Head: Normocephalic and atraumatic.  Cardiovascular: Normal rate, regular rhythm and normal heart sounds.   No murmur heard. Pulmonary/Chest: Effort normal and breath sounds normal. No respiratory distress. She has no wheezes.  2-3+ bilateral LE edema, only trace edema noted in hands.    Neurological: She is alert and oriented to person, place, and time.  Skin: Skin is warm and dry.  Psychiatric: She has a normal mood and affect. Her behavior is normal. Judgment and thought content normal.          Assessment & Plan:

## 2014-12-29 NOTE — Assessment & Plan Note (Signed)
She appears to be approaching her dry weight, clinically improved. Continue lasix at current dose. Obtain bmet.

## 2014-12-29 NOTE — Progress Notes (Signed)
Pre visit review using our clinic review tool, if applicable. No additional management support is needed unless otherwise documented below in the visit note. 

## 2014-12-29 NOTE — Patient Instructions (Signed)
Continue to weigh yourself daily. Call if you have progressive weight loss or Anytime you have any of the following symptoms: 1) 3 pound weight gain in 24 hours or 5 pounds in 1 week 2) shortness of breath, with or without a dry hacking cough 3) swelling in the hands, feet or stomach 4) if you have to sleep on extra pillows at night in order to breathe.   Add clonidine twice daily for blood pressure.

## 2014-12-29 NOTE — Telephone Encounter (Signed)
Potassium is low.  Take kdur 2 tabs by mouth tonight and then one tab by mouth once daily. Patient notified.

## 2014-12-29 NOTE — Assessment & Plan Note (Signed)
Uncontrolled. Add clonidine.  

## 2015-01-05 DIAGNOSIS — M9903 Segmental and somatic dysfunction of lumbar region: Secondary | ICD-10-CM | POA: Diagnosis not present

## 2015-01-05 DIAGNOSIS — M545 Low back pain: Secondary | ICD-10-CM | POA: Diagnosis not present

## 2015-01-05 DIAGNOSIS — M531 Cervicobrachial syndrome: Secondary | ICD-10-CM | POA: Diagnosis not present

## 2015-01-05 DIAGNOSIS — M9901 Segmental and somatic dysfunction of cervical region: Secondary | ICD-10-CM | POA: Diagnosis not present

## 2015-01-13 ENCOUNTER — Ambulatory Visit: Payer: Medicare Other | Admitting: Family

## 2015-01-13 DIAGNOSIS — M9903 Segmental and somatic dysfunction of lumbar region: Secondary | ICD-10-CM | POA: Diagnosis not present

## 2015-01-13 DIAGNOSIS — M531 Cervicobrachial syndrome: Secondary | ICD-10-CM | POA: Diagnosis not present

## 2015-01-13 DIAGNOSIS — M545 Low back pain: Secondary | ICD-10-CM | POA: Diagnosis not present

## 2015-01-13 DIAGNOSIS — M9901 Segmental and somatic dysfunction of cervical region: Secondary | ICD-10-CM | POA: Diagnosis not present

## 2015-01-20 ENCOUNTER — Ambulatory Visit (INDEPENDENT_AMBULATORY_CARE_PROVIDER_SITE_OTHER): Payer: Medicare Other | Admitting: Family

## 2015-01-20 ENCOUNTER — Encounter: Payer: Self-pay | Admitting: Family

## 2015-01-20 VITALS — BP 138/74 | HR 66 | Temp 98.3°F | Resp 16 | Ht 67.0 in | Wt 236.6 lb

## 2015-01-20 DIAGNOSIS — I10 Essential (primary) hypertension: Secondary | ICD-10-CM | POA: Diagnosis not present

## 2015-01-20 DIAGNOSIS — I509 Heart failure, unspecified: Secondary | ICD-10-CM

## 2015-01-20 LAB — BASIC METABOLIC PANEL
BUN: 34 mg/dL — ABNORMAL HIGH (ref 6–23)
CALCIUM: 10.3 mg/dL (ref 8.4–10.5)
CO2: 26 meq/L (ref 19–32)
Chloride: 103 mEq/L (ref 96–112)
Creatinine, Ser: 1.12 mg/dL (ref 0.40–1.20)
GFR: 50.85 mL/min — AB (ref >60.00)
Glucose, Bld: 106 mg/dL — ABNORMAL HIGH (ref 70–99)
POTASSIUM: 4 meq/L (ref 3.5–5.1)
Sodium: 136 mEq/L (ref 135–145)

## 2015-01-20 MED ORDER — LOSARTAN POTASSIUM 100 MG PO TABS: 100.0000 mg | ORAL_TABLET | Freq: Every day | ORAL | Status: DC

## 2015-01-20 MED ORDER — POTASSIUM CHLORIDE CRYS ER 20 MEQ PO TBCR: 20.0000 meq | EXTENDED_RELEASE_TABLET | Freq: Every day | ORAL | Status: DC

## 2015-01-20 MED ORDER — BISOPROLOL FUMARATE 10 MG PO TABS: 20.0000 mg | ORAL_TABLET | Freq: Every day | ORAL | Status: DC

## 2015-01-20 MED ORDER — SIMVASTATIN 10 MG PO TABS: 10.0000 mg | ORAL_TABLET | Freq: Every day | ORAL | Status: DC

## 2015-01-20 NOTE — Patient Instructions (Signed)
Please complete lab work prior to leaving.   Please schedule a follow up appointment in 3 months.  

## 2015-01-20 NOTE — Progress Notes (Signed)
Subjective:    Patient ID: [name], female    DOB: [birth year], 74 y.o.   MRN: [id number]  HPI   [name] sis a 74 yr old female who presents today for follow up.    HTN-  She is maintained on losartan, lasix, clonidine (added last visit), bisoprolol.  Reports feeling mildly light headed for 45 min to 1 hr after taking clonidine, but symptoms are mild and she is pleased with the improvement in her blood pressure.  BP Readings from Last 3 Encounters:  01/20/15 138/74  12/29/14 162/90  12/16/14 168/100   CHF- continues lasix.  Denies SOB Wt Readings from Last 3 Encounters:  01/20/15 236 lb 9.6 oz (107.321 kg)  12/29/14 236 lb 9.6 oz (107.321 kg)  12/16/14 244 lb 12.8 oz (111.041 kg)     Review of Systems See HPI  Past Medical History  Diagnosis Date  . Hypertension   . Obesity   . Allergic rhinitis   . 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 Osteopenia 11/26/2009  . Breast cancer     "right" (06/17/2012)  . Exertional dyspnea   . Thyroid disorder     "sees endocrinologist yearly" (06/17/2012)  . Iron deficiency anemia     "hematologist watches it" (06/17/2012)  . Osteoarthritis     severe right hip osteoarthritis-s/p hip replacement  . Arthritis     "knuckles" (06/17/2012)    History   Social History  . Marital Status: Married    Spouse Name: N/A  . Number of Children: N/A  . Years of Education: N/A   Occupational History  . Not on file.   Social History Main Topics  . Smoking status: Former Smoker -- 0.50 packs/day for 10 years    Types: Cigarettes    Quit date: 07/14/1974  . Smokeless tobacco: Never Used     Comment: quit in 1976 smoked for approx. 10 years  . Alcohol Use: 4.2 oz/week    7 Glasses of wine per week     Comment: drinks wine with dinner  . Drug Use: No  . Sexual Activity: Not Currently   Other Topics Concern  . Not on file   Social History Narrative   Retired   The patient is married and has one child and two  grandchildren that live in the area.     She drinks wine with dinner.  She quit smoking in 1976, smoked for   approximately 10 years.      Moved to [place] from [place]          Past Surgical History  Procedure Laterality Date  . Total hip arthroplasty  04/2006    right hip   . Mastectomy  1993?    bilateral mastectomy  . Tonsillectomy  1949  . Eye surgery  2011    cataract removal both eyes  . Total hip revision  06/17/2012    "right" (06/17/2012)  . Breast biopsy  ` 1992    "bilaterlly" (06/17/2012)  . Total hip revision  06/17/2012    Procedure: TOTAL HIP REVISION;  Surgeon: Kerin Salen, MD;  Location: [facility];  Service: Orthopedics;  Laterality: Right;  . Hip closed reduction  07/26/2012    Procedure: CLOSED MANIPULATION HIP;  Surgeon: Nita Sells, MD;  Location: [facility];  Service: Orthopedics;  Laterality: Right;  . Fracture surgery Right 01/01/14    Has pins. Procedure performed at [facility]    Family History  Problem Relation Age of  Onset  . Heart failure Father     deceased age 40  . Diabetes Father   . Alcohol abuse Father   . Breast cancer Mother     deceased at age 29 secondary to breast cancer    Allergies  Allergen Reactions  . Amlodipine     Swelling   . Sulfonamide Derivatives Swelling    REACTION: Swelling of the face; "eyes swelled shut"  . Chocolate Other (See Comments)    Sinus problems    Current Outpatient Prescriptions on File Prior to Visit  Medication Sig Dispense Refill  . Cholecalciferol (VITAMIN D) 2000 UNITS CAPS Take 4,000 Units by mouth daily.    . cloNIDine (CATAPRES) 0.1 MG tablet Take 1 tablet (0.1 mg total) by mouth 2 (two) times daily. 60 tablet 2  . fexofenadine (ALLEGRA) 180 MG tablet Take 180 mg by mouth daily as needed. For seasonal allergies    . fluticasone (FLONASE) 50 MCG/ACT nasal spray Place 1 spray into both nostrils daily. 48 g 1  . furosemide (LASIX) 20 MG tablet 2 tabs by mouth in the AM and  1 tab in the afteroon 90 tablet 0  . GELATIN PO Take 2,160 mg by mouth daily.    . Omega-3 Fatty Acids (FISH OIL) 1000 MG CAPS Take 1,000 mg by mouth daily.    Marland Kitchen omeprazole (PRILOSEC) 40 MG capsule Take 1 capsule (40 mg total) by mouth daily. 90 capsule 1  . traMADol (ULTRAM) 50 MG tablet Take 1 tablet (50 mg total) by mouth every 8 (eight) hours as needed. 45 tablet 0   No current facility-administered medications on file prior to visit.    BP 138/74 mmHg  Pulse 66  Temp(Src) 98.3 F (36.8 C) (Oral)  Resp 16  Ht 5\' 7"  (1.702 m)  Wt 236 lb 9.6 oz (107.321 kg)  BMI 37.05 kg/m2  SpO2 99%  LMP 07/11/1991       Objective:   Physical Exam  Constitutional: She appears well-developed and well-nourished.  Cardiovascular: Normal rate, regular rhythm and normal heart sounds.   No murmur heard. Pulmonary/Chest: Effort normal and breath sounds normal. No respiratory distress. She has no wheezes.  Musculoskeletal:  2+ bilateral LE edema  Psychiatric: She has a normal mood and affect. Her behavior is normal. Judgment and thought content normal.          Assessment & Plan:

## 2015-01-21 ENCOUNTER — Encounter: Payer: Self-pay | Admitting: Family

## 2015-01-23 NOTE — Assessment & Plan Note (Signed)
Improved.  Continue current meds

## 2015-01-23 NOTE — Assessment & Plan Note (Signed)
Appears stable, continue current dose of lasix. Obtain follow up bmet.

## 2015-01-25 DIAGNOSIS — M9903 Segmental and somatic dysfunction of lumbar region: Secondary | ICD-10-CM | POA: Diagnosis not present

## 2015-01-25 DIAGNOSIS — M9901 Segmental and somatic dysfunction of cervical region: Secondary | ICD-10-CM | POA: Diagnosis not present

## 2015-01-25 DIAGNOSIS — M531 Cervicobrachial syndrome: Secondary | ICD-10-CM | POA: Diagnosis not present

## 2015-01-25 DIAGNOSIS — M545 Low back pain: Secondary | ICD-10-CM | POA: Diagnosis not present

## 2015-01-29 ENCOUNTER — Other Ambulatory Visit: Payer: Self-pay | Admitting: Family

## 2015-02-01 ENCOUNTER — Encounter: Payer: Self-pay | Admitting: *Deleted

## 2015-02-04 ENCOUNTER — Encounter: Payer: Self-pay | Admitting: *Deleted

## 2015-02-10 ENCOUNTER — Encounter: Payer: Self-pay | Admitting: *Deleted

## 2015-02-18 ENCOUNTER — Other Ambulatory Visit: Payer: Self-pay | Admitting: Family

## 2015-02-19 NOTE — Telephone Encounter (Signed)
Faxed [facility]

## 2015-02-19 NOTE — Telephone Encounter (Signed)
Faxed [facility] [place]

## 2015-02-19 NOTE — Telephone Encounter (Signed)
Requesting:  Tramadol Contract  none UDS  12/26/14  Moderate Last OV   01/20/15 Last Refill  12/18/14   #45 with 0 refills  Please Advise

## 2015-03-04 ENCOUNTER — Other Ambulatory Visit: Payer: Self-pay | Admitting: Family

## 2015-03-05 ENCOUNTER — Telehealth: Payer: Self-pay | Admitting: Medical

## 2015-03-05 MED ORDER — FUROSEMIDE 20 MG PO TABS: ORAL_TABLET | ORAL | Status: DC

## 2015-03-08 NOTE — Telephone Encounter (Signed)
Opened for review since was in my box. But I don't see that I have seen pt. So closing chart.

## 2015-03-10 ENCOUNTER — Telehealth: Payer: Self-pay | Admitting: Medical

## 2015-03-10 NOTE — Telephone Encounter (Signed)
Per 03/04/15 refill note, rx was sent for #90 x 1 refill. Request already handled.

## 2015-03-10 NOTE — Telephone Encounter (Signed)
I got prescription refill request on lasix. Pt of [name]. Looks like I have never seen the pt. Would you pass on the request to [facility]. Thanks.

## 2015-03-22 ENCOUNTER — Other Ambulatory Visit: Payer: Self-pay | Admitting: Family

## 2015-04-01 DIAGNOSIS — Z23 Encounter for immunization: Secondary | ICD-10-CM | POA: Diagnosis not present

## 2015-04-06 DIAGNOSIS — Z961 Presence of intraocular lens: Secondary | ICD-10-CM | POA: Diagnosis not present

## 2015-04-06 DIAGNOSIS — H52203 Unspecified astigmatism, bilateral: Secondary | ICD-10-CM | POA: Diagnosis not present

## 2015-04-06 DIAGNOSIS — H43813 Vitreous degeneration, bilateral: Secondary | ICD-10-CM | POA: Diagnosis not present

## 2015-04-06 DIAGNOSIS — H26492 Other secondary cataract, left eye: Secondary | ICD-10-CM | POA: Diagnosis not present

## 2015-04-14 ENCOUNTER — Telehealth: Payer: Self-pay | Admitting: Family

## 2015-04-14 MED ORDER — TRAMADOL HCL 50 MG PO TABS: 50.0000 mg | ORAL_TABLET | Freq: Three times a day (TID) | ORAL | Status: DC | PRN

## 2015-04-14 NOTE — Telephone Encounter (Signed)
Last tramadol Rx 02/19/15, #45. Last UDS 12/2014 and due for UDS.  Pt has f/u on 04/27/15 and we will collect UDS at that visit. Rx printed and forwarded to PCP for signature.

## 2015-04-16 ENCOUNTER — Encounter: Payer: Self-pay | Admitting: Family

## 2015-04-23 ENCOUNTER — Ambulatory Visit: Payer: Medicare Other | Admitting: Family

## 2015-04-27 ENCOUNTER — Ambulatory Visit: Payer: Medicare Other | Admitting: Family

## 2015-04-30 ENCOUNTER — Ambulatory Visit: Payer: Medicare Other | Admitting: Family

## 2015-05-04 ENCOUNTER — Ambulatory Visit (INDEPENDENT_AMBULATORY_CARE_PROVIDER_SITE_OTHER): Payer: Medicare Other | Admitting: Family

## 2015-05-04 ENCOUNTER — Encounter: Payer: Self-pay | Admitting: Family

## 2015-05-04 VITALS — BP 148/66 | HR 64 | Temp 98.1°F | Resp 16 | Ht 67.0 in | Wt 235.6 lb

## 2015-05-04 DIAGNOSIS — Z5181 Encounter for therapeutic drug level monitoring: Secondary | ICD-10-CM

## 2015-05-04 DIAGNOSIS — R739 Hyperglycemia, unspecified: Secondary | ICD-10-CM

## 2015-05-04 DIAGNOSIS — E785 Hyperlipidemia, unspecified: Secondary | ICD-10-CM | POA: Diagnosis not present

## 2015-05-04 DIAGNOSIS — I509 Heart failure, unspecified: Secondary | ICD-10-CM

## 2015-05-04 DIAGNOSIS — B351 Tinea unguium: Secondary | ICD-10-CM

## 2015-05-04 DIAGNOSIS — I10 Essential (primary) hypertension: Secondary | ICD-10-CM

## 2015-05-04 MED ORDER — TERBINAFINE HCL 250 MG PO TABS: 250.0000 mg | ORAL_TABLET | Freq: Every day | ORAL | Status: DC

## 2015-05-04 MED ORDER — CLONIDINE HCL 0.1 MG PO TABS: ORAL_TABLET | ORAL | Status: DC

## 2015-05-04 NOTE — Progress Notes (Signed)
Pre visit review using our clinic review tool, if applicable. No additional management support is needed unless otherwise documented below in the visit note. 

## 2015-05-04 NOTE — Patient Instructions (Addendum)
Start lamisil once daily.  Follow up in 1 month fasting for lab work to recheck your liver and cholesterol.

## 2015-05-04 NOTE — Assessment & Plan Note (Signed)
BP stable on current meds. Continue same.  

## 2015-05-04 NOTE — Progress Notes (Signed)
Subjective:    Patient ID: [name], female    DOB: 01-Nov-1940, 74 y.o.   MRN: [id number]  HPI  [name] is a 74 yr old female who presents today for follow up.  1) CHF- pt reports that she decreased her furosemide dosing from 40mg  AM and 20mg  PM to 20mg  bid due to "aching." Reports that her dry mouth was horrible. Reports resolution of these symptoms with dose adjustment.  Wt Readings from Last 3 Encounters:  05/04/15 235 lb 9.6 oz (106.867 kg)  01/20/15 236 lb 9.6 oz (107.321 kg)  12/29/14 236 lb 9.6 oz (107.321 kg)   2) Hyperlipidemia-  maintained on simvastatin.  Lab Results  Component Value Date   CHOL 166 06/24/2014   HDL 61.00 06/24/2014   LDLCALC 89 06/24/2014   LDLDIRECT 110.4 12/13/2006   TRIG 80.0 06/24/2014   CHOLHDL 3 06/24/2014   3) HTN- she is maintained on clonidine, bisoprolol, losartan. No longer feeling dizzy or light headed.  BP Readings from Last 3 Encounters:  05/04/15 148/66  01/20/15 138/74  12/29/14 162/90   4) Onychomycosis- bilateral great toes x several years.    Review of Systems  Respiratory: Negative for shortness of breath.   Cardiovascular: Negative for chest pain.  Musculoskeletal: Negative for myalgias.   See HPI  Past Medical History  Diagnosis Date  . Hypertension   . Obesity   . Allergic rhinitis   . 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 Osteopenia 11/26/2009  . Breast cancer ([place])     "right" (06/17/2012)  . Exertional dyspnea   . Thyroid disorder     "sees endocrinologist yearly" (06/17/2012)  . Iron deficiency anemia     "hematologist watches it" (06/17/2012)  . Osteoarthritis     severe right hip osteoarthritis-s/p hip replacement  . Arthritis     "knuckles" (06/17/2012)    Social History   Social History  . Marital Status: Married    Spouse Name: N/A  . Number of Children: N/A  . Years of Education: N/A   Occupational History  . Not on file.   Social History Main Topics  . Smoking status:  Former Smoker -- 0.50 packs/day for 10 years    Types: Cigarettes    Quit date: 07/14/1974  . Smokeless tobacco: Never Used     Comment: quit in 1976 smoked for approx. 10 years  . Alcohol Use: 4.2 oz/week    7 Glasses of wine per week     Comment: drinks wine with dinner  . Drug Use: No  . Sexual Activity: Not Currently   Other Topics Concern  . Not on file   Social History Narrative   Retired   The patient is married and has one child and two grandchildren that live in the area.     She drinks wine with dinner.  She quit smoking in 1976, smoked for   approximately 10 years.      Moved to [place] from [place]          Past Surgical History  Procedure Laterality Date  . Total hip arthroplasty  04/2006    right hip   . Mastectomy  1993?    bilateral mastectomy  . Tonsillectomy  1949  . Eye surgery  2011    cataract removal both eyes  . Total hip revision  06/17/2012    "right" (06/17/2012)  . Breast biopsy  ` 1992    "bilaterlly" (06/17/2012)  . Total hip revision  06/17/2012  Procedure: TOTAL HIP REVISION;  Surgeon: Kerin Salen, MD;  Location: [facility];  Service: Orthopedics;  Laterality: Right;  . Hip closed reduction  07/26/2012    Procedure: CLOSED MANIPULATION HIP;  Surgeon: Nita Sells, MD;  Location: [facility];  Service: Orthopedics;  Laterality: Right;  . Fracture surgery Right 01/01/14    Has pins. Procedure performed at [facility]    Family History  Problem Relation Age of Onset  . Heart failure Father     deceased age 18  . Diabetes Father   . Alcohol abuse Father   . Breast cancer Mother     deceased at age 36 secondary to breast cancer    Allergies  Allergen Reactions  . Amlodipine     Swelling   . Sulfonamide Derivatives Swelling    REACTION: Swelling of the face; "eyes swelled shut"  . Chocolate Other (See Comments)    Sinus problems    Current Outpatient Prescriptions on File Prior to Visit  Medication Sig Dispense  Refill  . bisoprolol (ZEBETA) 10 MG tablet Take 2 tablets (20 mg total) by mouth daily. 180 tablet 1  . Cholecalciferol (VITAMIN D) 2000 UNITS CAPS Take 4,000 Units by mouth daily.    . cloNIDine (CATAPRES) 0.1 MG tablet TAKE 1 TABLET (0.1 MG TOTAL) BY MOUTH 2 (TWO) TIMES DAILY. 60 tablet 5  . fexofenadine (ALLEGRA) 180 MG tablet Take 180 mg by mouth daily as needed. For seasonal allergies    . fluticasone (FLONASE) 50 MCG/ACT nasal spray Place 1 spray into both nostrils daily. 48 g 1  . furosemide (LASIX) 20 MG tablet TAKE 2 TABS BY MOUTH IN THE MORNING AND 1 TAB IN THE AFTEROON (Patient taking differently: Take 20 mg by mouth 2 (two) times daily. ) 90 tablet 1  . GELATIN PO Take 2,160 mg by mouth daily.    Marland Kitchen losartan (COZAAR) 100 MG tablet Take 1 tablet (100 mg total) by mouth daily. 90 tablet 1  . Omega-3 Fatty Acids (FISH OIL) 1000 MG CAPS Take 1,000 mg by mouth daily.    Marland Kitchen omeprazole (PRILOSEC) 40 MG capsule Take 1 capsule (40 mg total) by mouth daily. 90 capsule 1  . potassium chloride SA (K-DUR,KLOR-CON) 20 MEQ tablet Take 1 tablet (20 mEq total) by mouth daily. 90 tablet 1  . simvastatin (ZOCOR) 10 MG tablet Take 1 tablet (10 mg total) by mouth at bedtime. 90 tablet 1  . traMADol (ULTRAM) 50 MG tablet Take 1 tablet (50 mg total) by mouth every 8 (eight) hours as needed. 45 tablet 0   No current facility-administered medications on file prior to visit.    BP 148/66 mmHg  Pulse 64  Temp(Src) 98.1 F (36.7 C) (Oral)  Resp 16  Ht 5\' 7"  (1.702 m)  Wt 235 lb 9.6 oz (106.867 kg)  BMI 36.89 kg/m2  SpO2 100%  LMP 07/11/1991       Objective:   Physical Exam  Constitutional: She appears well-developed and well-nourished.  Cardiovascular: Normal rate, regular rhythm and normal heart sounds.   No murmur heard. Pulmonary/Chest: Effort normal and breath sounds normal. No respiratory distress. She has no wheezes.  Musculoskeletal:  1+ bilateral LE edema  Skin:  Bilateral great  toenails hypertrophic/discolored  Psychiatric: She has a normal mood and affect. Her behavior is normal. Judgment and thought content normal.            Assessment & Plan:  Onychomycosis- start lamisil with close monitoring of LFT.

## 2015-05-04 NOTE — Assessment & Plan Note (Signed)
Tolerating statin, continue same, pt to return for flp.

## 2015-05-04 NOTE — Assessment & Plan Note (Signed)
Stable on bid dosing of lasix.  Continue same.

## 2015-05-25 ENCOUNTER — Other Ambulatory Visit: Payer: Self-pay | Admitting: Family

## 2015-05-27 ENCOUNTER — Other Ambulatory Visit: Payer: Self-pay | Admitting: Family

## 2015-06-05 ENCOUNTER — Other Ambulatory Visit: Payer: Self-pay | Admitting: Family Medicine

## 2015-06-06 ENCOUNTER — Other Ambulatory Visit: Payer: Self-pay | Admitting: Family

## 2015-06-06 NOTE — Telephone Encounter (Signed)
[facility] patient 

## 2015-06-07 NOTE — Telephone Encounter (Signed)
Pt due for UDS. Tramadol rx printed and forwarded to PCP for signature. Pt will need to pick up Rx and provide UDS at that time.

## 2015-06-08 ENCOUNTER — Other Ambulatory Visit (INDEPENDENT_AMBULATORY_CARE_PROVIDER_SITE_OTHER): Payer: Medicare Other

## 2015-06-08 DIAGNOSIS — R739 Hyperglycemia, unspecified: Secondary | ICD-10-CM

## 2015-06-08 DIAGNOSIS — E785 Hyperlipidemia, unspecified: Secondary | ICD-10-CM | POA: Diagnosis not present

## 2015-06-08 DIAGNOSIS — Z79891 Long term (current) use of opiate analgesic: Secondary | ICD-10-CM | POA: Diagnosis not present

## 2015-06-08 DIAGNOSIS — Z5181 Encounter for therapeutic drug level monitoring: Secondary | ICD-10-CM | POA: Diagnosis not present

## 2015-06-08 LAB — HEPATIC FUNCTION PANEL
ALT: 12 U/L (ref 0–35)
AST: 18 U/L (ref 0–37)
Albumin: 4 g/dL (ref 3.5–5.2)
Alkaline Phosphatase: 83 U/L (ref 39–117)
Bilirubin, Direct: 0.2 mg/dL (ref 0.0–0.3)
Total Bilirubin: 0.8 mg/dL (ref 0.2–1.2)
Total Protein: 6.8 g/dL (ref 6.0–8.3)

## 2015-06-08 LAB — LIPID PANEL
CHOLESTEROL: 139 mg/dL (ref 0–200)
HDL: 55.9 mg/dL (ref >39.00)
LDL Cholesterol: 69 mg/dL (ref 0–99)
NonHDL: 83.54
Total CHOL/HDL Ratio: 2
Triglycerides: 74 mg/dL (ref 0.0–149.0)
VLDL: 14.8 mg/dL (ref 0.0–40.0)

## 2015-06-08 LAB — HEMOGLOBIN A1C: HEMOGLOBIN A1C: 5.7 % (ref 4.6–6.5)

## 2015-06-08 LAB — BASIC METABOLIC PANEL
BUN: 29 mg/dL — AB (ref 6–23)
CHLORIDE: 103 meq/L (ref 96–112)
CO2: 26 mEq/L (ref 19–32)
CREATININE: 1.23 mg/dL — AB (ref 0.40–1.20)
Calcium: 10.4 mg/dL (ref 8.4–10.5)
GFR: 45.59 mL/min — ABNORMAL LOW (ref >60.00)
GLUCOSE: 104 mg/dL — AB (ref 70–99)
Potassium: 4.1 mEq/L (ref 3.5–5.1)
Sodium: 136 mEq/L (ref 135–145)

## 2015-06-08 NOTE — Telephone Encounter (Signed)
Rx placed at front desk, see 06/05/15 request.

## 2015-06-08 NOTE — Telephone Encounter (Signed)
Notified pt and placed Rx at front desk for pick up. 

## 2015-06-09 ENCOUNTER — Encounter: Payer: Self-pay | Admitting: Family

## 2015-06-15 ENCOUNTER — Telehealth: Payer: Self-pay | Admitting: Family

## 2015-06-15 NOTE — Telephone Encounter (Signed)
Please let pt know that UDS is showing hydrocodone. I would recommend that she d/c tramadol as this should not be mixed by hydrocodone.  Who is writing hydrocodone for her?

## 2015-06-16 NOTE — Telephone Encounter (Signed)
Ok

## 2015-06-16 NOTE — Telephone Encounter (Signed)
Spoke with pt and she states she was prescribed Hydrocodone by her dentist (Dr Gilford Raid) and took medication around 06/02/15. States that was her last dose. Pt would like to continue Tramadol as she states she is not taking hydrocodone now.  Please advise?

## 2015-06-16 NOTE — Telephone Encounter (Signed)
Notified pt. 

## 2015-06-22 ENCOUNTER — Encounter: Payer: Self-pay | Admitting: Family

## 2015-06-23 DIAGNOSIS — M9903 Segmental and somatic dysfunction of lumbar region: Secondary | ICD-10-CM | POA: Diagnosis not present

## 2015-06-23 DIAGNOSIS — M9901 Segmental and somatic dysfunction of cervical region: Secondary | ICD-10-CM | POA: Diagnosis not present

## 2015-06-23 DIAGNOSIS — M531 Cervicobrachial syndrome: Secondary | ICD-10-CM | POA: Diagnosis not present

## 2015-06-23 DIAGNOSIS — M545 Low back pain: Secondary | ICD-10-CM | POA: Diagnosis not present

## 2015-06-25 DIAGNOSIS — M9903 Segmental and somatic dysfunction of lumbar region: Secondary | ICD-10-CM | POA: Diagnosis not present

## 2015-06-25 DIAGNOSIS — M531 Cervicobrachial syndrome: Secondary | ICD-10-CM | POA: Diagnosis not present

## 2015-06-25 DIAGNOSIS — M545 Low back pain: Secondary | ICD-10-CM | POA: Diagnosis not present

## 2015-06-25 DIAGNOSIS — M9901 Segmental and somatic dysfunction of cervical region: Secondary | ICD-10-CM | POA: Diagnosis not present

## 2015-06-28 DIAGNOSIS — M9901 Segmental and somatic dysfunction of cervical region: Secondary | ICD-10-CM | POA: Diagnosis not present

## 2015-06-28 DIAGNOSIS — M545 Low back pain: Secondary | ICD-10-CM | POA: Diagnosis not present

## 2015-06-28 DIAGNOSIS — M9903 Segmental and somatic dysfunction of lumbar region: Secondary | ICD-10-CM | POA: Diagnosis not present

## 2015-06-28 DIAGNOSIS — M531 Cervicobrachial syndrome: Secondary | ICD-10-CM | POA: Diagnosis not present

## 2015-07-01 DIAGNOSIS — M7061 Trochanteric bursitis, right hip: Secondary | ICD-10-CM | POA: Diagnosis not present

## 2015-07-01 DIAGNOSIS — M545 Low back pain: Secondary | ICD-10-CM | POA: Diagnosis not present

## 2015-07-02 DIAGNOSIS — M9901 Segmental and somatic dysfunction of cervical region: Secondary | ICD-10-CM | POA: Diagnosis not present

## 2015-07-02 DIAGNOSIS — M545 Low back pain: Secondary | ICD-10-CM | POA: Diagnosis not present

## 2015-07-02 DIAGNOSIS — M9903 Segmental and somatic dysfunction of lumbar region: Secondary | ICD-10-CM | POA: Diagnosis not present

## 2015-07-02 DIAGNOSIS — M531 Cervicobrachial syndrome: Secondary | ICD-10-CM | POA: Diagnosis not present

## 2015-07-05 ENCOUNTER — Other Ambulatory Visit: Payer: Self-pay | Admitting: Family

## 2015-07-07 DIAGNOSIS — M531 Cervicobrachial syndrome: Secondary | ICD-10-CM | POA: Diagnosis not present

## 2015-07-07 DIAGNOSIS — M9901 Segmental and somatic dysfunction of cervical region: Secondary | ICD-10-CM | POA: Diagnosis not present

## 2015-07-07 DIAGNOSIS — M9903 Segmental and somatic dysfunction of lumbar region: Secondary | ICD-10-CM | POA: Diagnosis not present

## 2015-07-07 DIAGNOSIS — M545 Low back pain: Secondary | ICD-10-CM | POA: Diagnosis not present

## 2015-07-14 DIAGNOSIS — M545 Low back pain: Secondary | ICD-10-CM | POA: Diagnosis not present

## 2015-07-14 DIAGNOSIS — M9901 Segmental and somatic dysfunction of cervical region: Secondary | ICD-10-CM | POA: Diagnosis not present

## 2015-07-14 DIAGNOSIS — M9903 Segmental and somatic dysfunction of lumbar region: Secondary | ICD-10-CM | POA: Diagnosis not present

## 2015-07-14 DIAGNOSIS — M531 Cervicobrachial syndrome: Secondary | ICD-10-CM | POA: Diagnosis not present

## 2015-07-15 ENCOUNTER — Encounter: Payer: Self-pay | Admitting: Family

## 2015-07-15 ENCOUNTER — Other Ambulatory Visit: Payer: Self-pay | Admitting: Family

## 2015-07-15 DIAGNOSIS — R600 Localized edema: Secondary | ICD-10-CM

## 2015-07-16 ENCOUNTER — Encounter: Payer: Self-pay | Admitting: Family

## 2015-07-19 NOTE — Telephone Encounter (Signed)
Shiquita-- can you call pt to r/s her wellness visit and lab visit with Ashlee. See pt's message above.  Thanks!

## 2015-07-28 DIAGNOSIS — M545 Low back pain: Secondary | ICD-10-CM | POA: Diagnosis not present

## 2015-07-28 DIAGNOSIS — M9903 Segmental and somatic dysfunction of lumbar region: Secondary | ICD-10-CM | POA: Diagnosis not present

## 2015-07-28 DIAGNOSIS — M531 Cervicobrachial syndrome: Secondary | ICD-10-CM | POA: Diagnosis not present

## 2015-07-28 DIAGNOSIS — M9901 Segmental and somatic dysfunction of cervical region: Secondary | ICD-10-CM | POA: Diagnosis not present

## 2015-08-04 ENCOUNTER — Ambulatory Visit: Payer: Medicare Other

## 2015-08-04 ENCOUNTER — Other Ambulatory Visit: Payer: Medicare Other

## 2015-08-05 ENCOUNTER — Telehealth: Payer: Self-pay | Admitting: *Deleted

## 2015-08-05 NOTE — Telephone Encounter (Signed)
Unable to reach patient at time of pre-visit call. Left message confirming appt tomorrow w/ Ashlee.

## 2015-08-06 ENCOUNTER — Other Ambulatory Visit: Payer: Self-pay | Admitting: Family

## 2015-08-06 ENCOUNTER — Ambulatory Visit (INDEPENDENT_AMBULATORY_CARE_PROVIDER_SITE_OTHER): Payer: Medicare Other

## 2015-08-06 ENCOUNTER — Other Ambulatory Visit (INDEPENDENT_AMBULATORY_CARE_PROVIDER_SITE_OTHER): Payer: Medicare Other

## 2015-08-06 ENCOUNTER — Telehealth: Payer: Self-pay | Admitting: *Deleted

## 2015-08-06 VITALS — BP 136/78 | HR 74 | Ht 67.0 in | Wt 231.6 lb

## 2015-08-06 DIAGNOSIS — Z0000 Encounter for general adult medical examination without abnormal findings: Secondary | ICD-10-CM | POA: Diagnosis not present

## 2015-08-06 DIAGNOSIS — Z1211 Encounter for screening for malignant neoplasm of colon: Secondary | ICD-10-CM | POA: Diagnosis not present

## 2015-08-06 DIAGNOSIS — R600 Localized edema: Secondary | ICD-10-CM | POA: Diagnosis not present

## 2015-08-06 DIAGNOSIS — Z1212 Encounter for screening for malignant neoplasm of rectum: Secondary | ICD-10-CM

## 2015-08-06 LAB — BASIC METABOLIC PANEL
BUN: 28 mg/dL — ABNORMAL HIGH (ref 6–23)
CO2: 25 mEq/L (ref 19–32)
CREATININE: 1.21 mg/dL — AB (ref 0.40–1.20)
Calcium: 10.2 mg/dL (ref 8.4–10.5)
Chloride: 104 mEq/L (ref 96–112)
GFR: 46.44 mL/min — ABNORMAL LOW (ref >60.00)
Glucose, Bld: 113 mg/dL — ABNORMAL HIGH (ref 70–99)
POTASSIUM: 3.9 meq/L (ref 3.5–5.1)
Sodium: 137 mEq/L (ref 135–145)

## 2015-08-06 MED ORDER — FUROSEMIDE 20 MG PO TABS: ORAL_TABLET | ORAL | Status: DC

## 2015-08-06 NOTE — Patient Instructions (Addendum)
Eat heart healthy diet (full of fruits, vegetables, whole grains, lean protein, water--limit salt, fat, and sugar intake) and increase physical activity as tolerated.  Sign up for Silver Sneakers.    Continue doing brain stimulating activities (puzzles, reading, adult coloring books, staying active) to keep memory sharp.   Schedule Colonoscopy by the end of this year.    Try journaling and prayer for emotional health.    Follow up with Debbrah Alar as scheduled.     Colonoscopy A colonoscopy is an exam to look at the entire large intestine (colon). This exam can help find problems such as tumors, polyps, inflammation, and areas of bleeding. The exam takes about 1 hour.  LET [facility] CARE PROVIDER KNOW ABOUT:   Any allergies you have.  All medicines you are taking, including vitamins, herbs, eye drops, creams, and over-the-counter medicines.  Previous problems you or members of your family have had with the use of anesthetics.  Any blood disorders you have.  Previous surgeries you have had.  Medical conditions you have. RISKS AND COMPLICATIONS  Generally, this is a safe procedure. However, as with any procedure, complications can occur. Possible complications include:  Bleeding.  Tearing or rupture of the colon wall.  Reaction to medicines given during the exam.  Infection (rare). BEFORE THE PROCEDURE   Ask your health care provider about changing or stopping your regular medicines.  You may be prescribed an oral bowel prep. This involves drinking a large amount of medicated liquid, starting the day before your procedure. The liquid will cause you to have multiple loose stools until your stool is almost clear or light green. This cleans out your colon in preparation for the procedure.  Do not eat or drink anything else once you have started the bowel prep, unless your health care provider tells you it is safe to do so.  Arrange for someone to drive you home after  the procedure. PROCEDURE   You will be given medicine to help you relax (sedative).  You will lie on your side with your knees bent.  A long, flexible tube with a light and camera on the end (colonoscope) will be inserted through the rectum and into the colon. The camera sends video back to a computer screen as it moves through the colon. The colonoscope also releases carbon dioxide gas to inflate the colon. This helps your health care provider see the area better.  During the exam, your health care provider may take a small tissue sample (biopsy) to be examined under a microscope if any abnormalities are found.  The exam is finished when the entire colon has been viewed. AFTER THE PROCEDURE   Do not drive for 24 hours after the exam.  You may have a small amount of blood in your stool.  You may pass moderate amounts of gas and have mild abdominal cramping or bloating. This is caused by the gas used to inflate your colon during the exam.  Ask when your test results will be ready and how you will get your results. Make sure you get your test results.   This information is not intended to replace advice given to you by your health care provider. Make sure you discuss any questions you have with your health care provider.   Document Released: 06/23/2000 Document Revised: 04/16/2013 Document Reviewed: 03/03/2013 Elsevier Interactive Patient Education 2016 [employer] in the Home  Falls can cause injuries. They can happen to people of all ages.  There are many things you can do to make your home safe and to help prevent falls.  WHAT CAN I DO ON THE OUTSIDE OF MY HOME?  Regularly fix the edges of walkways and driveways and fix any cracks.  Remove anything that might make you trip as you walk through a door, such as a raised step or threshold.  Trim any bushes or trees on the path to your home.  Use bright outdoor lighting.  Clear any walking paths of anything that  might make someone trip, such as rocks or tools.  Regularly check to see if handrails are loose or broken. Make sure that both sides of any steps have handrails.  Any raised decks and porches should have guardrails on the edges.  Have any leaves, snow, or ice cleared regularly.  Use sand or salt on walking paths during winter.  Clean up any spills in your garage right away. This includes oil or grease spills. WHAT CAN I DO IN THE BATHROOM?   Use night lights.  Install grab bars by the toilet and in the tub and shower. Do not use towel bars as grab bars.  Use non-skid mats or decals in the tub or shower.  If you need to sit down in the shower, use a plastic, non-slip stool.  Keep the floor dry. Clean up any water that spills on the floor as soon as it happens.  Remove soap buildup in the tub or shower regularly.  Attach bath mats securely with double-sided non-slip rug tape.  Do not have throw rugs and other things on the floor that can make you trip. WHAT CAN I DO IN THE BEDROOM?  Use night lights.  Make sure that you have a light by your bed that is easy to reach.  Do not use any sheets or blankets that are too big for your bed. They should not hang down onto the floor.  Have a firm chair that has side arms. You can use this for support while you get dressed.  Do not have throw rugs and other things on the floor that can make you trip. WHAT CAN I DO IN THE KITCHEN?  Clean up any spills right away.  Avoid walking on wet floors.  Keep items that you use a lot in easy-to-reach places.  If you need to reach something above you, use a strong step stool that has a grab bar.  Keep electrical cords out of the way.  Do not use floor polish or wax that makes floors slippery. If you must use wax, use non-skid floor wax.  Do not have throw rugs and other things on the floor that can make you trip. WHAT CAN I DO WITH MY STAIRS?  Do not leave any items on the  stairs.  Make sure that there are handrails on both sides of the stairs and use them. Fix handrails that are broken or loose. Make sure that handrails are as long as the stairways.  Check any carpeting to make sure that it is firmly attached to the stairs. Fix any carpet that is loose or worn.  Avoid having throw rugs at the top or bottom of the stairs. If you do have throw rugs, attach them to the floor with carpet tape.  Make sure that you have a light switch at the top of the stairs and the bottom of the stairs. If you do not have them, ask someone to add them for you. WHAT ELSE CAN I  DO TO HELP PREVENT FALLS?  Wear shoes that:  Do not have high heels.  Have rubber bottoms.  Are comfortable and fit you well.  Are closed at the toe. Do not wear sandals.  If you use a stepladder:  Make sure that it is fully opened. Do not climb a closed stepladder.  Make sure that both sides of the stepladder are locked into place.  Ask someone to hold it for you, if possible.  Clearly mark and make sure that you can see:  Any grab bars or handrails.  First and last steps.  Where the edge of each step is.  Use tools that help you move around (mobility aids) if they are needed. These include:  Canes.  Walkers.  Scooters.  Crutches.  Turn on the lights when you go into a dark area. Replace any light bulbs as soon as they burn out.  Set up your furniture so you have a clear path. Avoid moving your furniture around.  If any of your floors are uneven, fix them.  If there are any pets around you, be aware of where they are.  Review your medicines with your doctor. Some medicines can make you feel dizzy. This can increase your chance of falling. Ask your doctor what other things that you can do to help prevent falls.   This information is not intended to replace advice given to you by your health care provider. Make sure you discuss any questions you have with your health care  provider.   Document Released: 04/22/2009 Document Revised: 11/10/2014 Document Reviewed: 07/31/2014 Elsevier Interactive Patient Education [employer].

## 2015-08-06 NOTE — Progress Notes (Signed)
Reviewed

## 2015-08-06 NOTE — Progress Notes (Signed)
Pre visit review using our clinic review tool, if applicable. No additional management support is needed unless otherwise documented below in the visit note. 

## 2015-08-06 NOTE — Telephone Encounter (Signed)
[facility] 90 day supply of furosemide 20mg .  Rx sent.

## 2015-08-06 NOTE — Assessment & Plan Note (Signed)
BNP drawn on 07/14/14- 457.0.  Taking beta blocker, anti-hypertensive and diuretic medications.  Immunizations UTD.  Encouraged heart healthy diet, limiting salt intake.  Encouraged to increase physical activity as tolerated.  PCP following.

## 2015-08-06 NOTE — Progress Notes (Signed)
Subjective:   [name] is a 75 y.o. female who presents for Medicare Annual (Subsequent) preventive examination.  Review of Systems: No ROS Cardiac Risk Factors include: advanced age (>9men, >65 women);hypertension;obesity (BMI >30kg/m2) Sleep patterns: Sleeps at least 5-6 hours per night/wakes up at least once to void.   Home Safety/Smoke Alarms:  Feels safe at home. Live in a one level townhome with husband and 1 dog.  Smoke alarms.   Firearm Safety:  Kept in safe place.   Seat Belt Safety/Bike Helmet:  Always wears seat belt.    Counseling:   Eye Exam- 1/17 Dental- Goes every 6 months.   Female:  Mammo-Bilateral mastectomy     Dexa scan-04/10/13-osteopenia--taking Vitamin D and calcium in diet      CCS-07/07/11, repeat in 3 years-Dr. Pyrtle---ordered      Objective:     Vitals: BP 136/78 mmHg  Pulse 74  Ht 5\' 7"  (1.702 m)  Wt 231 lb 9.6 oz (105.053 kg)  BMI 36.27 kg/m2  SpO2 98%  LMP 07/11/1991  Tobacco History  Smoking status  . Former Smoker -- 0.50 packs/day for 10 years  . Types: Cigarettes  . Quit date: 07/14/1974  Smokeless tobacco  . Never Used    Comment: quit in 1976 smoked for approx. 10 years     Counseling given: No   Past Medical History  Diagnosis Date  . Hypertension   . Obesity   . Allergic rhinitis   . 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 Osteopenia 11/26/2009  . Breast cancer ([place])     "right" (06/17/2012)  . Exertional dyspnea   . Thyroid disorder     "sees endocrinologist yearly" (06/17/2012)  . Iron deficiency anemia     "hematologist watches it" (06/17/2012)  . Osteoarthritis     severe right hip osteoarthritis-s/p hip replacement  . Arthritis     "knuckles" (06/17/2012)   Past Surgical History  Procedure Laterality Date  . Total hip arthroplasty  04/2006    right hip   . Mastectomy  1993?    bilateral mastectomy  . Tonsillectomy  1949  . Eye surgery  2011    cataract removal both eyes  . Total hip revision   06/17/2012    "right" (06/17/2012)  . Breast biopsy  ` 1992    "bilaterlly" (06/17/2012)  . Total hip revision  06/17/2012    Procedure: TOTAL HIP REVISION;  Surgeon: Kerin Salen, MD;  Location: [place];  Service: Orthopedics;  Laterality: Right;  . Hip closed reduction  07/26/2012    Procedure: CLOSED MANIPULATION HIP;  Surgeon: Nita Sells, MD;  Location: [facility];  Service: Orthopedics;  Laterality: Right;  . Fracture surgery Right 01/01/14    Has pins. Procedure performed at [facility]   Family History  Problem Relation Age of Onset  . Heart failure Father     deceased age 69  . Diabetes Father   . Alcohol abuse Father   . Breast cancer Mother     deceased at age 6 secondary to breast cancer  . Other Sister     Liver failure--deceased   History  Sexual Activity  . Sexual Activity: Not Currently    Outpatient Encounter Prescriptions as of 08/06/2015  Medication Sig  . bisoprolol (ZEBETA) 10 MG tablet TAKE 2 TABLETS EVERY DAY  . Cholecalciferol (VITAMIN D) 2000 UNITS CAPS Take 4,000 Units by mouth daily.  . cloNIDine (CATAPRES) 0.1 MG tablet TAKE 1 TABLET (0.1 MG TOTAL) BY MOUTH  2 (TWO) TIMES DAILY.  . fexofenadine (ALLEGRA) 180 MG tablet Take 180 mg by mouth daily as needed. For seasonal allergies  . fluticasone (FLONASE) 50 MCG/ACT nasal spray USE 1 SPRAY IN EACH NOSTRIL EVERY DAY  . furosemide (LASIX) 20 MG tablet TAKE 2 TABS BY MOUTH IN THE MORNING AND 1 TAB IN THE AFTEROON  . GELATIN PO Take 2,160 mg by mouth daily.  Marland Kitchen losartan (COZAAR) 100 MG tablet TAKE 1 TABLET EVERY DAY  . meloxicam (MOBIC) 15 MG tablet Take 15 mg by mouth daily. with food  . Omega-3 Fatty Acids (FISH OIL) 1000 MG CAPS Take 1,000 mg by mouth daily.  Marland Kitchen omeprazole (PRILOSEC) 40 MG capsule TAKE 1 CAPSULE (40 MG TOTAL) EVERY DAY  . potassium chloride SA (K-DUR,KLOR-CON) 20 MEQ tablet TAKE 1 TABLET EVERY DAY  . simvastatin (ZOCOR) 10 MG tablet TAKE 1 TABLET AT BEDTIME  . traMADol (ULTRAM) 50  MG tablet TAKE 1 TABLET BY MOUTH EVERY 8 HOURS AS NEEDED  . terbinafine (LAMISIL) 250 MG tablet Take 1 tablet (250 mg total) by mouth daily. (Patient not taking: Reported on 08/06/2015)   No facility-administered encounter medications on file as of 08/06/2015.    Activities of Daily Living In your present state of health, do you have any difficulty performing the following activities: 08/06/2015  Hearing? N  Vision? N  Difficulty concentrating or making decisions? Y  Walking or climbing stairs? Y  Dressing or bathing? N  Doing errands, shopping? N  Preparing Food and eating ? N  Using the Toilet? N  In the past six months, have you accidently leaked urine? Y  Do you have problems with loss of bowel control? N  Managing your Medications? N  Managing your Finances? N  Housekeeping or managing your Housekeeping? N    Patient Care Team: Debbrah Alar, NP as PCP - General (Internal Medicine) Volanda Napoleon, MD (Hematology and Oncology) Marygrace Drought, MD (Ophthalmology) Frederik Pear, MD as Consulting Physician (Orthopedic Surgery)    Assessment:   Exercise Activities and Dietary recommendations Current Exercise Habits:: Home exercise routine, Type of exercise: walking, Time (Minutes): 30, Frequency (Times/Week): 6, Weekly Exercise (Minutes/Week): 180, Intensity: Mild  Diet:  Regular diet.  Eats 3 meals per day.  Eat out 2 days out of the week.  Lacinda Axon all other days.  Drinks lots of water.  Cup of tea in the morning.    Goals    . Lose 20 lbs by next year.       Increase physical activity----Silver Sneakers at the [employer]       Fall Risk Fall Risk  08/06/2015 06/24/2014 03/05/2013  Falls in the past year? Yes Yes No  Number falls in past yr: 1 2 or more -  Injury with Fall? Yes - -  Risk for fall due to : Impaired balance/gait;Impaired mobility History of fall(s);Impaired mobility History of fall(s)  Follow up Education provided;Falls prevention discussed - -    Depression Screen PHQ 2/9 Scores 08/06/2015 06/24/2014 03/05/2013  PHQ - 2 Score 1 6 0  PHQ- 9 Score - 9 -     Cognitive Testing MMSE - Mini Mental State Exam 08/06/2015  Orientation to time 5  Orientation to Place 5  Registration 3  Attention/ Calculation 5  Recall 3  Language- name 2 objects 2  Language- repeat 1  Language- follow 3 step command 3  Language- read & follow direction 1  Write a sentence 1  Copy design 1  Total score 30    Immunization History  Administered Date(s) Administered  . Influenza Split 03/22/2012  . Influenza Whole 04/28/2008, 05/04/2009  . Influenza, High Dose Seasonal PF 04/01/2015  . Influenza,inj,Quad PF,36+ Mos 03/19/2013, 03/25/2014  . Pneumococcal Conjugate-13 06/25/2013  . Pneumococcal Polysaccharide-23 05/27/2001, 06/18/2012  . Td 05/26/2008  . Zoster 12/01/2010   Screening Tests Health Maintenance  Topic Date Due  . COLONOSCOPY  07/06/2014  . MAMMOGRAM  08/28/2034 (Originally 09/25/2010)  . INFLUENZA VACCINE  02/08/2016  . TETANUS/TDAP  05/26/2018  . DEXA SCAN  Completed  . ZOSTAVAX  Completed  . PNA vac Low Risk Adult  Completed      Plan:  Eat heart healthy diet (full of fruits, vegetables, whole grains, lean protein, water--limit salt, fat, and sugar intake) and increase physical activity as tolerated.  Continue doing brain stimulating activities (puzzles, reading, adult coloring books, staying active) to keep memory sharp.   Schedule Colonoscopy by the end of this year.    Try journaling and prayer for emotional health.    Follow up with Debbrah Alar as scheduled.     During the course of the visit the patient was educated and counseled about the following appropriate screening and preventive services:   Vaccines to include Pneumoccal, Influenza, Hepatitis B, Td, Zostavax, HCV  Electrocardiogram  Cardiovascular Disease  Colorectal cancer screening  Bone density screening  Diabetes screening  Glaucoma  screening  Mammography/PAP  Nutrition counseling   Patient Instructions (the written plan) was given to the patient.   Rudene Anda, RN  08/06/2015

## 2015-08-09 ENCOUNTER — Encounter: Payer: Self-pay | Admitting: Internal Medicine

## 2015-08-10 ENCOUNTER — Other Ambulatory Visit: Payer: Self-pay | Admitting: Family

## 2015-08-10 NOTE — Telephone Encounter (Signed)
Rx was placed at front desk for pick up and pt was notified.

## 2015-08-10 NOTE — Telephone Encounter (Signed)
Last tramadol Rx:  06/07/15  Last UDS:  06/08/15 and due now for repeat UDS (high risk) Next OV:  09/2015.    Rx printed and forwarded to PCP for signature along with note that pt will need to pick up Rx and provide UDS at that time.

## 2015-08-10 NOTE — Telephone Encounter (Signed)
See 08/06/15 refill request

## 2015-08-25 ENCOUNTER — Ambulatory Visit (AMBULATORY_SURGERY_CENTER): Payer: Medicare Other

## 2015-08-25 VITALS — Ht 67.0 in | Wt 230.0 lb

## 2015-08-25 DIAGNOSIS — Z860100 Personal history of colon polyps, unspecified: Secondary | ICD-10-CM

## 2015-08-25 MED ORDER — SUPREP BOWEL PREP KIT 17.5-3.13-1.6 GM/177ML PO SOLN: 1.0000 | Freq: Once | ORAL | Status: DC

## 2015-08-25 NOTE — Progress Notes (Signed)
No allergies to eggs or soy No past problems with anesthesia No home oxygen No diet/weight loss meds  Has email and internet; registered for emmi 

## 2015-09-06 ENCOUNTER — Ambulatory Visit (AMBULATORY_SURGERY_CENTER): Payer: Medicare Other | Admitting: Internal Medicine

## 2015-09-06 ENCOUNTER — Encounter: Payer: Self-pay | Admitting: Internal Medicine

## 2015-09-06 VITALS — BP 122/68 | HR 64 | Temp 96.4°F | Resp 10 | Ht 67.0 in | Wt 230.0 lb

## 2015-09-06 DIAGNOSIS — D124 Benign neoplasm of descending colon: Secondary | ICD-10-CM

## 2015-09-06 DIAGNOSIS — D123 Benign neoplasm of transverse colon: Secondary | ICD-10-CM

## 2015-09-06 DIAGNOSIS — D125 Benign neoplasm of sigmoid colon: Secondary | ICD-10-CM

## 2015-09-06 DIAGNOSIS — E669 Obesity, unspecified: Secondary | ICD-10-CM | POA: Diagnosis not present

## 2015-09-06 DIAGNOSIS — Z86010 Personal history of colonic polyps: Secondary | ICD-10-CM | POA: Diagnosis not present

## 2015-09-06 DIAGNOSIS — E119 Type 2 diabetes mellitus without complications: Secondary | ICD-10-CM | POA: Diagnosis not present

## 2015-09-06 DIAGNOSIS — I10 Essential (primary) hypertension: Secondary | ICD-10-CM | POA: Diagnosis not present

## 2015-09-06 DIAGNOSIS — D649 Anemia, unspecified: Secondary | ICD-10-CM | POA: Diagnosis not present

## 2015-09-06 MED ORDER — SODIUM CHLORIDE 0.9 % IV SOLN: 500.0000 mL | INTRAVENOUS | Status: DC

## 2015-09-06 NOTE — Op Note (Signed)
[place]  [address] [place], [address]   COLONOSCOPY PROCEDURE REPORT  PATIENT: [name], [name]  MR#: [id number] BIRTHDATE: 02-04-1941 , 74  yrs. old GENDER: female ENDOSCOPIST: Jerene Bears, MD PROCEDURE DATE:  09/06/2015 PROCEDURE:   Colonoscopy, surveillance and Colonoscopy with snare polypectomy First Screening Colonoscopy - Avg.  risk and is 50 yrs.  old or older - No.  Prior Negative Screening - Now for repeat screening. N/A  History of Adenoma - Now for follow-up colonoscopy & has been > or = to 3 yrs.  Yes hx of adenoma.  Has been 3 or more years since last colonoscopy.  Polyps removed today? 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 MEDICATIONS: Monitored anesthesia care and Propofol 300 mg IV  DESCRIPTION OF PROCEDURE:   After the risks benefits and alternatives of the procedure were thoroughly explained, informed consent was obtained.  The digital rectal exam revealed no rectal mass.   The [id number]  endoscope was introduced through the anus and advanced to the cecum, which was identified by both the appendix and ileocecal valve. No adverse events experienced. The quality of the prep was good.  (Suprep was used)  The instrument was then slowly withdrawn as the colon was fully examined. Estimated blood loss is zero unless otherwise noted in this procedure report.   COLON FINDINGS: Two sessile polyps ranging from 4 to 21mm in size were found in the transverse colon and sigmoid colon. Polypectomies were performed with a cold snare.  The resection was complete, the polyp tissue was completely retrieved and sent to histology.   There was mild diverticulosis noted in the sigmoid colon.  Retroflexed views revealed no abnormalities. The time to cecum = 3.8 Withdrawal time = 9.6   The scope was withdrawn and the procedure completed. COMPLICATIONS: There were  no immediate complications.  ENDOSCOPIC IMPRESSION: 1.   Two sessile polyps ranging from 4 to 24mm in size were found in the transverse colon and sigmoid colon; polypectomies were performed with a cold snare 2.   Mild diverticulosis was noted in the sigmoid colon  RECOMMENDATIONS: 1.  Await pathology results 2.  High fiber diet 3.  Repeat Colonoscopy in 5 years. 4.  You will receive a letter within 1-2 weeks with the results of your biopsy as well as final recommendations.  Please call my office if you have not received a letter after 3 weeks.  eSigned:  Jerene Bears, MD 09/06/2015 3:12 PM   cc:  the patient, Debbrah Alar

## 2015-09-06 NOTE — Progress Notes (Signed)
Called to room to assist during endoscopic procedure.  Patient ID and intended procedure confirmed with present staff. Received instructions for my participation in the procedure from the performing physician.  

## 2015-09-06 NOTE — Progress Notes (Signed)
To recovery, report to Ennis, RN, VSS 

## 2015-09-06 NOTE — Patient Instructions (Signed)
Impressions/recommendations:  Polyps (handout given) Diverticulosis (handout given) High Fiber diet (handout given)  Repeat colonoscopy in 5 years.  YOU HAD AN ENDOSCOPIC PROCEDURE TODAY AT [facility]:   Refer to the procedure report that was given to you for any specific questions about what was found during the examination.  If the procedure report does not answer your questions, please call your gastroenterologist to clarify.  If you requested that your care partner not be given the details of your procedure findings, then the procedure report has been included in a sealed envelope for you to review at your convenience later.  YOU SHOULD EXPECT: Some feelings of bloating in the abdomen. Passage of more gas than usual.  Walking can help get rid of the air that was put into your GI tract during the procedure and reduce the bloating. If you had a lower endoscopy (such as a colonoscopy or flexible sigmoidoscopy) you may notice spotting of blood in your stool or on the toilet paper. If you underwent a bowel prep for your procedure, you may not have a normal bowel movement for a few days.  Please Note:  You might notice some irritation and congestion in your nose or some drainage.  This is from the oxygen used during your procedure.  There is no need for concern and it should clear up in a day or so.  SYMPTOMS TO REPORT IMMEDIATELY:   Following lower endoscopy (colonoscopy or flexible sigmoidoscopy):  Excessive amounts of blood in the stool  Significant tenderness or worsening of abdominal pains  Swelling of the abdomen that is new, acute  Fever of 100F or higher  For urgent or emergent issues, a gastroenterologist can be reached at any hour by calling [phone].   DIET: Your first meal following the procedure should be a small meal and then it is ok to progress to your normal diet. Heavy or fried foods are harder to digest and may make you feel nauseous or bloated.   Likewise, meals heavy in dairy and vegetables can increase bloating.  Drink plenty of fluids but you should avoid alcoholic beverages for 24 hours.  ACTIVITY:  You should plan to take it easy for the rest of today and you should NOT DRIVE or use heavy machinery until tomorrow (because of the sedation medicines used during the test).    FOLLOW UP: Our staff will call the number listed on your records the next business day following your procedure to check on you and address any questions or concerns that you may have regarding the information given to you following your procedure. If we do not reach you, we will leave a message.  However, if you are feeling well and you are not experiencing any problems, there is no need to return our call.  We will assume that you have returned to your regular daily activities without incident.  If any biopsies were taken you will be contacted by phone or by letter within the next 1-3 weeks.  Please call us at [phone] if you have not heard about the biopsies in 3 weeks.    SIGNATURES/CONFIDENTIALITY: You and/or your care partner have signed paperwork which will be entered into your electronic medical record.  These signatures attest to the fact that that the information above on your After Visit Summary has been reviewed and is understood.  Full responsibility of the confidentiality of this discharge information lies with you and/or your care-partner.

## 2015-09-07 ENCOUNTER — Telehealth: Payer: Self-pay

## 2015-09-07 NOTE — Telephone Encounter (Signed)
  Follow up Call-  Call back number 09/06/2015  Post procedure Call Back phone  # [phone]  Permission to leave phone message Yes     Patient was called for follow up after procedure on 09/06/2015. I spoke with the patients husband and he reports that [name] has returned to her normal daily activities without difficulty.

## 2015-09-08 ENCOUNTER — Other Ambulatory Visit: Payer: Self-pay | Admitting: Family

## 2015-09-13 ENCOUNTER — Encounter: Payer: Self-pay | Admitting: Internal Medicine

## 2015-09-22 ENCOUNTER — Encounter: Payer: Self-pay | Admitting: Family

## 2015-09-22 ENCOUNTER — Ambulatory Visit (INDEPENDENT_AMBULATORY_CARE_PROVIDER_SITE_OTHER): Payer: Medicare Other | Admitting: Family

## 2015-09-22 VITALS — BP 154/80 | HR 74 | Temp 97.6°F | Wt 234.8 lb

## 2015-09-22 DIAGNOSIS — I10 Essential (primary) hypertension: Secondary | ICD-10-CM | POA: Diagnosis not present

## 2015-09-22 DIAGNOSIS — I509 Heart failure, unspecified: Secondary | ICD-10-CM

## 2015-09-22 DIAGNOSIS — Z79899 Other long term (current) drug therapy: Secondary | ICD-10-CM | POA: Diagnosis not present

## 2015-09-22 DIAGNOSIS — R002 Palpitations: Secondary | ICD-10-CM | POA: Diagnosis not present

## 2015-09-22 DIAGNOSIS — B351 Tinea unguium: Secondary | ICD-10-CM

## 2015-09-22 LAB — CBC WITH DIFFERENTIAL/PLATELET
BASOS ABS: 0.1 10*3/uL (ref 0.0–0.1)
Basophils Relative: 0.6 % (ref 0.0–3.0)
EOS ABS: 0.2 10*3/uL (ref 0.0–0.7)
Eosinophils Relative: 1.6 % (ref 0.0–5.0)
HEMATOCRIT: 33.8 % — AB (ref 36.0–46.0)
HEMOGLOBIN: 11.3 g/dL — AB (ref 12.0–15.0)
LYMPHS PCT: 26.8 % (ref 12.0–46.0)
Lymphs Abs: 2.6 10*3/uL (ref 0.7–4.0)
MCHC: 33.5 g/dL (ref 30.0–36.0)
MCV: 94.7 fl (ref 78.0–100.0)
Monocytes Absolute: 0.6 10*3/uL (ref 0.1–1.0)
Monocytes Relative: 6.1 % (ref 3.0–12.0)
NEUTROS ABS: 6.3 10*3/uL (ref 1.4–7.7)
Neutrophils Relative %: 64.9 % (ref 43.0–77.0)
PLATELETS: 278 10*3/uL (ref 150.0–400.0)
RBC: 3.56 Mil/uL — ABNORMAL LOW (ref 3.87–5.11)
RDW: 13.4 % (ref 11.5–15.5)
WBC: 9.8 10*3/uL (ref 4.0–10.5)

## 2015-09-22 LAB — BASIC METABOLIC PANEL
BUN: 25 mg/dL — ABNORMAL HIGH (ref 6–23)
CALCIUM: 10.5 mg/dL (ref 8.4–10.5)
CO2: 26 mEq/L (ref 19–32)
CREATININE: 1.12 mg/dL (ref 0.40–1.20)
Chloride: 104 mEq/L (ref 96–112)
GFR: 50.75 mL/min — ABNORMAL LOW (ref >60.00)
Glucose, Bld: 114 mg/dL — ABNORMAL HIGH (ref 70–99)
Potassium: 4.2 mEq/L (ref 3.5–5.1)
Sodium: 137 mEq/L (ref 135–145)

## 2015-09-22 LAB — TSH: TSH: 0.99 u[IU]/mL (ref 0.35–4.50)

## 2015-09-22 MED ORDER — TRAMADOL HCL 50 MG PO TABS: 50.0000 mg | ORAL_TABLET | Freq: Three times a day (TID) | ORAL | Status: DC | PRN

## 2015-09-22 MED ORDER — LOSARTAN POTASSIUM 100 MG PO TABS: 100.0000 mg | ORAL_TABLET | Freq: Every day | ORAL | Status: DC

## 2015-09-22 NOTE — Progress Notes (Signed)
Subjective:    Patient ID: [name], female    DOB: 1941/04/04, 75 y.o.   MRN: [id number]  HPI   [name] is a 75 yr old female who presents today for follow up of multiple medical problems.   CHF- she is maintained on lasix 20mg  bid. Feels like mouth gets dry at higher doses. Notes occasional cramping in hands/feet. Walking.   Wt Readings from Last 3 Encounters:  09/22/15 234 lb 12.8 oz (106.505 kg)  09/06/15 230 lb (104.327 kg)  08/25/15 230 lb (104.327 kg)   HTN- current BP medications include:  Bisoprolol, clonidine, losartan.  She attributes elevated BP today to taking a meloxicam.  BP Readings from Last 3 Encounters:  09/22/15 154/80  09/06/15 122/68  08/06/15 136/78   Onychomycosis- took lamisil x 15 days and it made her nauseated and decided that she will just "live with the fungus."    Review of Systems  Respiratory: Negative for cough and shortness of breath.   Cardiovascular: Positive for leg swelling.       Occasional palpitations     Past Medical History  Diagnosis Date  . Hypertension   . Obesity   . Allergic rhinitis   . 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 Osteopenia 11/26/2009  . Breast cancer ([place])     "right" (06/17/2012)  . Exertional dyspnea   . Thyroid disorder     "sees endocrinologist yearly" (06/17/2012)  . Iron deficiency anemia     "hematologist watches it" (06/17/2012)  . Osteoarthritis     severe right hip osteoarthritis-s/p hip replacement  . Arthritis     "knuckles" (06/17/2012)  . Cataracts, bilateral     Social History   Social History  . Marital Status: Married    Spouse Name: N/A  . Number of Children: N/A  . Years of Education: N/A   Occupational History  . Not on file.   Social History Main Topics  . Smoking status: Former Smoker -- 0.50 packs/day for 10 years    Types: Cigarettes    Quit date: 07/14/1974  . Smokeless tobacco: Never Used     Comment: quit in 1976 smoked for approx. 10 years  . Alcohol  Use: 4.2 oz/week    7 Glasses of wine per week     Comment: drinks wine with dinner  . Drug Use: No  . Sexual Activity: Not Currently   Other Topics Concern  . Not on file   Social History Narrative   Retired   The patient is married and has one child and two grandchildren that live in the area.     She drinks wine with dinner.  She quit smoking in 1976, smoked for   approximately 10 years.      Moved to [place] from [place]          Past Surgical History  Procedure Laterality Date  . Total hip arthroplasty  04/2006    right hip   . Mastectomy  1993?    bilateral mastectomy  . Tonsillectomy  1949  . Eye surgery  2011    cataract removal both eyes  . Total hip revision  06/17/2012    "right" (06/17/2012)  . Breast biopsy  ` 1992    "bilaterlly" (06/17/2012)  . Total hip revision  06/17/2012    Procedure: TOTAL HIP REVISION;  Surgeon: Kerin Salen, MD;  Location: [place];  Service: Orthopedics;  Laterality: Right;  . Hip closed reduction  07/26/2012  Procedure: CLOSED MANIPULATION HIP;  Surgeon: Nita Sells, MD;  Location[facility];  Service: Orthopedics;  Laterality: Right;  . Fracture surgery Right 01/01/14    Has pins. Procedure performed at [facility]    Family History  Problem Relation Age of Onset  . Heart failure Father     deceased age 35  . Diabetes Father   . Alcohol abuse Father   . Breast cancer Mother     deceased at age 22 secondary to breast cancer  . Other Sister     Liver failure--deceased  . Colon cancer Neg Hx   . Esophageal cancer Neg Hx   . Rectal cancer Neg Hx   . Stomach cancer Neg Hx     Allergies  Allergen Reactions  . Amlodipine     Swelling   . Sulfonamide Derivatives Swelling    REACTION: Swelling of the face; "eyes swelled shut"  . Chocolate Other (See Comments)    Sinus problems    Current Outpatient Prescriptions on File Prior to Visit  Medication Sig Dispense Refill  . bisoprolol (ZEBETA) 10 MG  tablet TAKE 2 TABLETS EVERY DAY 180 tablet 1  . Cholecalciferol (VITAMIN D) 2000 UNITS CAPS Take 4,000 Units by mouth daily.    . cloNIDine (CATAPRES) 0.1 MG tablet TAKE 1 TABLET (0.1 MG TOTAL) BY MOUTH 2 (TWO) TIMES DAILY. 180 tablet 1  . fexofenadine (ALLEGRA) 180 MG tablet Take 180 mg by mouth daily as needed. For seasonal allergies    . fluticasone (FLONASE) 50 MCG/ACT nasal spray USE 1 SPRAY IN EACH NOSTRIL EVERY DAY 32 g 1  . furosemide (LASIX) 20 MG tablet TAKE 2 TABS BY MOUTH IN THE MORNING AND 1 TAB IN THE AFTEROON (Patient taking differently: 2 (two) times daily. TAKE 2 TABS BY MOUTH IN THE MORNING AND 1 TAB IN THE AFTEROON) 270 tablet 1  . GELATIN PO Take 2,160 mg by mouth daily.    Marland Kitchen losartan (COZAAR) 100 MG tablet TAKE 1 TABLET EVERY DAY 90 tablet 1  . Omega-3 Fatty Acids (FISH OIL) 1000 MG CAPS Take 1,000 mg by mouth daily.    Marland Kitchen omeprazole (PRILOSEC) 40 MG capsule TAKE 1 CAPSULE (40 MG TOTAL) EVERY DAY 90 capsule 1  . potassium chloride SA (K-DUR,KLOR-CON) 20 MEQ tablet TAKE 1 TABLET EVERY DAY 90 tablet 1  . simvastatin (ZOCOR) 10 MG tablet TAKE 1 TABLET AT BEDTIME 90 tablet 1  . traMADol (ULTRAM) 50 MG tablet TAKE 1 TABLET BY MOUTH EVERY 8 HOURS AS NEEDED 45 tablet 0   No current facility-administered medications on file prior to visit.    BP 154/80 mmHg  Pulse 74  Temp(Src) 97.6 F (36.4 C) (Oral)  Wt 234 lb 12.8 oz (106.505 kg)  SpO2 97%  LMP 07/11/1991       Objective:   Physical Exam  Constitutional: She is oriented to person, place, and time. She appears well-developed and well-nourished.  HENT:  Head: Normocephalic and atraumatic.  Cardiovascular: Normal rate, regular rhythm and normal heart sounds.   No murmur heard. Pulmonary/Chest: Effort normal and breath sounds normal. No respiratory distress. She has no wheezes.  Musculoskeletal:  1+ bilateral LE edema  Neurological: She is alert and oriented to person, place, and time.  Psychiatric: She has a normal  mood and affect. Her behavior is normal. Judgment and thought content normal.          Assessment & Plan:  Palpitations-  EKG is performed and shows NSR.  Electrolytes and blood count are stable. TSH WNL.  Will refer for holter monitor.

## 2015-09-22 NOTE — Patient Instructions (Addendum)
Please complete lab work prior to leaving. (including UDS) Check blood pressure once daily at home for the next week. Please send me the results in 1 week via mychart.

## 2015-09-22 NOTE — Progress Notes (Signed)
Pre visit review using our clinic review tool, if applicable. No additional management support is needed unless otherwise documented below in the visit note. 

## 2015-09-23 ENCOUNTER — Telehealth: Payer: Self-pay | Admitting: Family

## 2015-09-23 DIAGNOSIS — B351 Tinea unguium: Secondary | ICD-10-CM | POA: Insufficient documentation

## 2015-09-23 NOTE — Telephone Encounter (Signed)
See my chart message

## 2015-09-23 NOTE — Assessment & Plan Note (Signed)
She is maintained on lasix 20mg  bid.  Her weight is up a few pounds in the last 2.5 weeks. We discussed returning to 40mg  AM and 20mg  PM for 3 days, then returning to 20mg  bid. Monitor weights, can add in the additional 20mg  in AM if weight gain occurs.

## 2015-09-23 NOTE — Assessment & Plan Note (Signed)
BP up a bit today. She has cuff at home.  I have asked her to continue current medications but to check BP daily x 1 week at home and send me her readings in mychart.

## 2015-09-23 NOTE — Assessment & Plan Note (Signed)
She wishes not to treat.

## 2015-09-27 ENCOUNTER — Other Ambulatory Visit: Payer: Self-pay | Admitting: Family

## 2015-09-28 NOTE — Telephone Encounter (Signed)
Medication filled to pharmacy as requested.   

## 2015-09-29 ENCOUNTER — Encounter: Payer: Self-pay | Admitting: Family

## 2015-09-30 ENCOUNTER — Ambulatory Visit (INDEPENDENT_AMBULATORY_CARE_PROVIDER_SITE_OTHER): Payer: Medicare Other

## 2015-09-30 DIAGNOSIS — R002 Palpitations: Secondary | ICD-10-CM

## 2015-10-11 ENCOUNTER — Encounter: Payer: Self-pay | Admitting: Family

## 2015-10-11 ENCOUNTER — Telehealth: Payer: Self-pay | Admitting: Family

## 2015-10-11 DIAGNOSIS — R002 Palpitations: Secondary | ICD-10-CM

## 2015-10-11 NOTE — Telephone Encounter (Signed)
Notified pt and she is agreeable to proceed with referral. 

## 2015-10-11 NOTE — Telephone Encounter (Signed)
Please contact patient and let her know that I reviewed her holter monitor.  Note was made of some brief episodes of rapid heart rate (SVT).  I would like to refer her to cardiology.

## 2015-10-29 ENCOUNTER — Encounter: Payer: Self-pay | Admitting: Internal Medicine

## 2015-10-29 ENCOUNTER — Ambulatory Visit (INDEPENDENT_AMBULATORY_CARE_PROVIDER_SITE_OTHER): Payer: Medicare Other | Admitting: Internal Medicine

## 2015-10-29 VITALS — BP 148/76 | HR 67 | Ht 66.0 in | Wt 233.0 lb

## 2015-10-29 DIAGNOSIS — I471 Supraventricular tachycardia, unspecified: Secondary | ICD-10-CM

## 2015-10-29 DIAGNOSIS — G4719 Other hypersomnia: Secondary | ICD-10-CM

## 2015-10-29 DIAGNOSIS — I509 Heart failure, unspecified: Secondary | ICD-10-CM

## 2015-10-29 DIAGNOSIS — R0683 Snoring: Secondary | ICD-10-CM

## 2015-10-29 DIAGNOSIS — E669 Obesity, unspecified: Secondary | ICD-10-CM

## 2015-10-29 DIAGNOSIS — I5030 Unspecified diastolic (congestive) heart failure: Secondary | ICD-10-CM

## 2015-10-29 DIAGNOSIS — R002 Palpitations: Secondary | ICD-10-CM | POA: Diagnosis not present

## 2015-10-29 DIAGNOSIS — I10 Essential (primary) hypertension: Secondary | ICD-10-CM

## 2015-10-29 DIAGNOSIS — R0602 Shortness of breath: Secondary | ICD-10-CM

## 2015-10-29 DIAGNOSIS — I493 Ventricular premature depolarization: Secondary | ICD-10-CM

## 2015-10-29 NOTE — Progress Notes (Signed)
OFFICE NOTE  Chief Complaint:  Palpitations  Primary Care Physician: Nance Pear., NP  HPI:  [name] is a 75 y.o. female who is currently referred to me for evaluation of palpitations. While Courtney. Owens has noted some palpitations, she reports not being particularly bothered by them. Her past medical history significant for an episode of congestive heart failure back in January 2016. She presented with symptoms of anasarca including upper and lower extremity swelling. The time she was on amlodipine which was discontinued and she was started on Lasix. She had significant diuresis and an echocardiogram, however did showed normal systolic and reportedly diastolic function. Based on this findings is difficult to understand whether this was cardiac edema or some other cause of volume overload. Nonetheless, recently she's had some palpitations and was fitted with a monitor. The monitor demonstrated PACs, PVCs and an episode of SVT which was transient and spontaneously terminated. Does note that she's been having some more frequent episodes of palpitations but they are not significantly bothersome to her. She also reports some heaviness in her chest when she does activities, although she is very inactive due to her hip problem and walks with cane. She also has morbid obesity and hypertension. She does take Bystolic is one of her blood pressure medications. In addition I asked her screening questions for possible sleep apnea. She does report daytime fatigue, somnolence, poor sleep at night, witnessed snoring and occasional episodes of apnea. Her Epworth sleepiness score scale is greater than 10.  PMHx:  Past Medical History  Diagnosis Date  . Hypertension   . Obesity   . Allergic rhinitis   . 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 Osteopenia 11/26/2009  . Breast cancer ([place])     "right" (06/17/2012)  . Exertional dyspnea   . Thyroid disorder     "sees endocrinologist yearly"  (06/17/2012)  . Iron deficiency anemia     "hematologist watches it" (06/17/2012)  . Osteoarthritis     severe right hip osteoarthritis-s/p hip replacement  . Arthritis     "knuckles" (06/17/2012)  . Cataracts, bilateral     Past Surgical History  Procedure Laterality Date  . Total hip arthroplasty  04/2006    right hip   . Mastectomy  1993?    bilateral mastectomy  . Tonsillectomy  1949  . Eye surgery  2011    cataract removal both eyes  . Total hip revision  06/17/2012    "right" (06/17/2012)  . Breast biopsy  ` 1992    "bilaterlly" (06/17/2012)  . Total hip revision  06/17/2012    Procedure: TOTAL HIP REVISION;  Surgeon: Kerin Salen, MD;  Location: [place];  Service: Orthopedics;  Laterality: Right;  . Hip closed reduction  07/26/2012    Procedure: CLOSED MANIPULATION HIP;  Surgeon: Nita Sells, MD;  Location: [facility];  Service: Orthopedics;  Laterality: Right;  . Fracture surgery Right 01/01/14    Has pins. Procedure performed at [facility]    FAMHx:  Family History  Problem Relation Age of Onset  . Heart failure Father     deceased age 60  . Diabetes Father   . Alcohol abuse Father   . Breast cancer Mother     deceased at age 52 secondary to breast cancer  . Other Sister     Liver failure--deceased  . Colon cancer Neg Hx   . Esophageal cancer Neg Hx   . Rectal cancer Neg Hx   . Stomach cancer Neg  Hx     SOCHx:   reports that she quit smoking about 41 years ago. Her smoking use included Cigarettes. She has a 5 pack-year smoking history. She has never used smokeless tobacco. She reports that she drinks about 4.2 oz of alcohol per week. She reports that she does not use illicit drugs.  ALLERGIES:  Allergies  Allergen Reactions  . Amlodipine     Swelling   . Sulfonamide Derivatives Swelling    REACTION: Swelling of the face; "eyes swelled shut"  . Chocolate Other (See Comments)    Sinus problems    ROS: Pertinent items noted in HPI and  remainder of comprehensive ROS otherwise negative.  HOME MEDS: Current Outpatient Prescriptions  Medication Sig Dispense Refill  . bisoprolol (ZEBETA) 10 MG tablet TAKE 2 TABLETS EVERY DAY 180 tablet 1  . Cholecalciferol (VITAMIN D) 2000 UNITS CAPS Take 4,000 Units by mouth daily.    . cloNIDine (CATAPRES) 0.1 MG tablet TAKE 1 TABLET TWICE DAILY 180 tablet 1  . fexofenadine (ALLEGRA) 180 MG tablet Take 180 mg by mouth daily as needed. For seasonal allergies    . fluticasone (FLONASE) 50 MCG/ACT nasal spray USE 1 SPRAY IN EACH NOSTRIL EVERY DAY 32 g 1  . furosemide (LASIX) 20 MG tablet TAKE 2 TABS BY MOUTH IN THE MORNING AND 1 TAB IN THE AFTEROON (Patient taking differently: 2 (two) times daily. TAKE 2 TABS BY MOUTH IN THE MORNING AND 1 TAB IN THE AFTEROON) 270 tablet 1  . GELATIN PO Take 2,160 mg by mouth daily.    Marland Kitchen losartan (COZAAR) 100 MG tablet Take 1 tablet (100 mg total) by mouth daily. 90 tablet 1  . Omega-3 Fatty Acids (FISH OIL) 1000 MG CAPS Take 1,000 mg by mouth daily.    Marland Kitchen omeprazole (PRILOSEC) 40 MG capsule TAKE 1 CAPSULE (40 MG TOTAL) EVERY DAY 90 capsule 1  . potassium chloride SA (K-DUR,KLOR-CON) 20 MEQ tablet TAKE 1 TABLET EVERY DAY 90 tablet 1  . simvastatin (ZOCOR) 10 MG tablet TAKE 1 TABLET AT BEDTIME 90 tablet 1  . traMADol (ULTRAM) 50 MG tablet Take 1 tablet (50 mg total) by mouth every 8 (eight) hours as needed. 45 tablet 0   No current facility-administered medications for this visit.    LABS/IMAGING: No results found for this or any previous visit (from the past 48 hour(s)). No results found.  WEIGHTS: Wt Readings from Last 3 Encounters:  10/29/15 233 lb (105.688 kg)  09/22/15 234 lb 12.8 oz (106.505 kg)  09/06/15 230 lb (104.327 kg)    VITALS: BP 148/76 mmHg  Pulse 67  Ht 5\' 6"  (1.676 m)  Wt 233 lb (105.688 kg)  BMI 37.63 kg/m2  LMP 07/11/1991  EXAM: General appearance: alert, no distress and morbidly obese Neck: no carotid bruit and no JVD Lungs:  clear to auscultation bilaterally Heart: regular rate and rhythm, S1, S2 normal, no murmur, click, rub or gallop Abdomen: soft, non-tender; bowel sounds normal; no masses,  no organomegaly Extremities: edema Trace pedal edema Pulses: 2+ and symmetric Skin: Skin color, texture, turgor normal. No rashes or lesions Neurologic: Grossly normal Psych: Pleasant  EKG: Normal sinus rhythm at 67, possible LAE  ASSESSMENT: 1. Palpitations-PSVT, PACs, PVCs 2. Chest pressure 3. Dyspnea on exertion 4. Essential hypertension 5. Morbid obesity 6. Family history of coronary disease 7. Possible sleep apnea  PLAN: 1.   [name] has had some recent development of SVT and extrasystoles which were noted on a monitor. Is  not clear that she is very symptomatic with this. We know LV function was normal including diastolic function when she presented with an episode of volume overload or possibly congestive heart failure. I would like to assess for possible ischemic causes of her arrhythmias with a Lexiscan nuclear stress test. If this is low risk we can consider readjusting her medications or possibly changing Bystolic over to a different beta blocker such as carvedilol which will also give better beta-blockade. In addition, her arrhythmias may be related to sleep apnea. She tells a lot of symptoms concerning for a primary sleep disorder. I like for her to have a sleep study to evaluate for sleep apnea as she is at higher risk.  Follow-up with me afterwards. Thanks again for the kind referral.  Pixie Casino, MD, [facility] Attending Cardiologist [place] 10/29/2015, 1:07 PM

## 2015-10-29 NOTE — Patient Instructions (Signed)
Your physician has requested that you have a lexiscan myoview. For further information please visit [email or web address]. Please follow instruction sheet, as given.  Your physician has recommended that you have a sleep study @ [facility]. This test records several body functions during sleep, including: brain activity, eye movement, oxygen and carbon dioxide blood levels, heart rate and rhythm, breathing rate and rhythm, the flow of air through your mouth and nose, snoring, body muscle movements, and chest and belly movement.  Your physician recommends that you schedule a follow-up appointment after your testing.

## 2015-11-02 ENCOUNTER — Encounter: Payer: Self-pay | Admitting: *Deleted

## 2015-11-12 ENCOUNTER — Telehealth: Payer: Self-pay | Admitting: Internal Medicine

## 2015-11-15 NOTE — Telephone Encounter (Signed)
Close encounter 

## 2015-11-17 ENCOUNTER — Inpatient Hospital Stay (HOSPITAL_COMMUNITY): Admission: RE | Admit: 2015-11-17 | Payer: Medicare Other | Source: Ambulatory Visit

## 2015-11-18 ENCOUNTER — Inpatient Hospital Stay (HOSPITAL_COMMUNITY): Admission: RE | Admit: 2015-11-18 | Payer: Medicare Other | Source: Ambulatory Visit

## 2015-11-22 ENCOUNTER — Encounter: Payer: Self-pay | Admitting: Family

## 2015-11-22 ENCOUNTER — Other Ambulatory Visit: Payer: Self-pay | Admitting: Family

## 2015-11-22 MED ORDER — POTASSIUM CHLORIDE CRYS ER 20 MEQ PO TBCR: 20.0000 meq | EXTENDED_RELEASE_TABLET | Freq: Every day | ORAL | Status: DC

## 2015-11-25 ENCOUNTER — Telehealth (HOSPITAL_COMMUNITY): Payer: Self-pay

## 2015-11-25 NOTE — Telephone Encounter (Signed)
Encounter complete. 

## 2015-11-29 ENCOUNTER — Other Ambulatory Visit: Payer: Self-pay | Admitting: Family

## 2015-11-30 ENCOUNTER — Ambulatory Visit (HOSPITAL_COMMUNITY)
Admission: RE | Admit: 2015-11-30 | Discharge: 2015-11-30 | Disposition: A | Discharge status: Home / Self Care | Payer: Medicare Other | Source: Ambulatory Visit | Attending: Internal Medicine | Admitting: Internal Medicine

## 2015-11-30 DIAGNOSIS — E669 Obesity, unspecified: Secondary | ICD-10-CM | POA: Insufficient documentation

## 2015-11-30 DIAGNOSIS — R0602 Shortness of breath: Secondary | ICD-10-CM | POA: Insufficient documentation

## 2015-11-30 DIAGNOSIS — R5383 Other fatigue: Secondary | ICD-10-CM | POA: Diagnosis not present

## 2015-11-30 DIAGNOSIS — I5030 Unspecified diastolic (congestive) heart failure: Secondary | ICD-10-CM | POA: Insufficient documentation

## 2015-11-30 DIAGNOSIS — R079 Chest pain, unspecified: Secondary | ICD-10-CM | POA: Diagnosis not present

## 2015-11-30 DIAGNOSIS — Z87891 Personal history of nicotine dependence: Secondary | ICD-10-CM | POA: Insufficient documentation

## 2015-11-30 DIAGNOSIS — Z6837 Body mass index (BMI) 37.0-37.9, adult: Secondary | ICD-10-CM | POA: Insufficient documentation

## 2015-11-30 DIAGNOSIS — I493 Ventricular premature depolarization: Secondary | ICD-10-CM | POA: Insufficient documentation

## 2015-11-30 DIAGNOSIS — I10 Essential (primary) hypertension: Secondary | ICD-10-CM | POA: Insufficient documentation

## 2015-11-30 MED ORDER — REGADENOSON 0.4 MG/5ML IV SOLN
0.4000 mg | Freq: Once | INTRAVENOUS | Status: AC
  Administered 2015-11-30: 0.4 mg via INTRAVENOUS

## 2015-11-30 MED ORDER — TECHNETIUM TC 99M TETROFOSMIN IV KIT
31.2000 | PACK | Freq: Once | INTRAVENOUS | Status: AC | PRN
  Administered 2015-11-30: 31.2 via INTRAVENOUS
  Filled 2015-11-30: qty 31

## 2015-12-01 ENCOUNTER — Ambulatory Visit (HOSPITAL_COMMUNITY)
Admission: RE | Admit: 2015-12-01 | Discharge: 2015-12-01 | Disposition: A | Discharge status: Home / Self Care | Payer: Medicare Other | Source: Ambulatory Visit | Attending: Cardiovascular Disease | Admitting: Cardiovascular Disease

## 2015-12-01 LAB — MYOCARDIAL PERFUSION IMAGING
CHL CUP NUCLEAR SDS: 0
LV dias vol: 90 mL (ref 46–106)
LVSYSVOL: 33 mL
Peak HR: 76 {beats}/min
Rest HR: 57 {beats}/min
SRS: 0
SSS: 0
TID: 1.18

## 2015-12-07 ENCOUNTER — Encounter: Payer: Self-pay | Admitting: *Deleted

## 2015-12-07 ENCOUNTER — Encounter: Payer: Self-pay | Admitting: Internal Medicine

## 2015-12-07 ENCOUNTER — Ambulatory Visit (INDEPENDENT_AMBULATORY_CARE_PROVIDER_SITE_OTHER): Payer: Medicare Other | Admitting: Internal Medicine

## 2015-12-07 VITALS — BP 134/78 | HR 64 | Ht 67.0 in | Wt 238.2 lb

## 2015-12-07 DIAGNOSIS — I509 Heart failure, unspecified: Secondary | ICD-10-CM | POA: Diagnosis not present

## 2015-12-07 DIAGNOSIS — E785 Hyperlipidemia, unspecified: Secondary | ICD-10-CM | POA: Diagnosis not present

## 2015-12-07 DIAGNOSIS — I493 Ventricular premature depolarization: Secondary | ICD-10-CM | POA: Diagnosis not present

## 2015-12-07 DIAGNOSIS — E669 Obesity, unspecified: Secondary | ICD-10-CM

## 2015-12-07 DIAGNOSIS — I471 Supraventricular tachycardia: Secondary | ICD-10-CM

## 2015-12-07 MED ORDER — ZOLPIDEM TARTRATE 5 MG PO TABS: 5.0000 mg | ORAL_TABLET | Freq: Every evening | ORAL | Status: DC | PRN

## 2015-12-07 NOTE — Progress Notes (Signed)
OFFICE NOTE  Chief Complaint:  Follow-up stress test, 5 lbs weight gain  Primary Care Physician: Nance Pear., NP  HPI:  [name] is a 75 y.o. female who is currently referred to me for evaluation of palpitations. While [name] has noted some palpitations, she reports not being particularly bothered by them. Her past medical history significant for an episode of congestive heart failure back in January 2016. She presented with symptoms of anasarca including upper and lower extremity swelling. The time she was on amlodipine which was discontinued and she was started on Lasix. She had significant diuresis and an echocardiogram, however did showed normal systolic and reportedly diastolic function. Based on this findings is difficult to understand whether this was cardiac edema or some other cause of volume overload. Nonetheless, recently she's had some palpitations and was fitted with a monitor. The monitor demonstrated PACs, PVCs and an episode of SVT which was transient and spontaneously terminated. Does note that she's been having some more frequent episodes of palpitations but they are not significantly bothersome to her. She also reports some heaviness in her chest when she does activities, although she is very inactive due to her hip problem and walks with cane. She also has morbid obesity and hypertension. She does take Bystolic is one of her blood pressure medications. In addition I asked her screening questions for possible sleep apnea. She does report daytime fatigue, somnolence, poor sleep at night, witnessed snoring and occasional episodes of apnea. Her Epworth sleepiness score scale is greater than 10.  12/07/2015  [name] was seen today in follow-up of her recent stress test. This was negative for ischemia, was interpreted as low risk and showed an LVEF of 64%. She reports since her stress test her palpitations have improved quite a bit. She is on bisoprolol 10  mg twice a day. She has not yet had her sleep study which is coming up on June 15. She does worry that she may not be able sleep well enough for that study. She typically goes to bed at 2 AM and sleeps till about 6 AM. She says she often naps during the day and this could be playing a role in her poor sleep at night. While she is tried to go to bed earlier she says she cannot fall asleep. She also reports that she's had about 5 pound weight gain over the past 2 days with notable increase in lower extremity swelling. She and her husband report increase salt intake with various foods over the holiday weekend.  PMHx:  Past Medical History  Diagnosis Date  . Hypertension   . Obesity   . Allergic rhinitis   . 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 Osteopenia 11/26/2009  . Breast cancer ([place])     "right" (06/17/2012)  . Exertional dyspnea   . Thyroid disorder     "sees endocrinologist yearly" (06/17/2012)  . Iron deficiency anemia     "hematologist watches it" (06/17/2012)  . Osteoarthritis     severe right hip osteoarthritis-s/p hip replacement  . Arthritis     "knuckles" (06/17/2012)  . Cataracts, bilateral     Past Surgical History  Procedure Laterality Date  . Total hip arthroplasty  04/2006    right hip   . Mastectomy  1993?    bilateral mastectomy  . Tonsillectomy  1949  . Eye surgery  2011    cataract removal both eyes  . Total hip revision  06/17/2012    "right" (06/17/2012)  .  Breast biopsy  ` 1992    "bilaterlly" (06/17/2012)  . Total hip revision  06/17/2012    Procedure: TOTAL HIP REVISION;  Surgeon: Kerin Salen, MD;  Location: [place];  Service: Orthopedics;  Laterality: Right;  . Hip closed reduction  07/26/2012    Procedure: CLOSED MANIPULATION HIP;  Surgeon: Nita Sells, MD;  [facility];  Service: Orthopedics;  Laterality: Right;  . Fracture surgery Right 01/01/14    Has pins. Procedure performed at [facility]    FAMHx:  Family History    Problem Relation Age of Onset  . Heart failure Father     deceased age 70  . Diabetes Father   . Alcohol abuse Father   . Breast cancer Mother     deceased at age 50 secondary to breast cancer  . Liver disease Sister     Liver failure--deceased  . Colon cancer Neg Hx   . Esophageal cancer Neg Hx   . Rectal cancer Neg Hx   . Stomach cancer Neg Hx   . Dementia Maternal Grandmother     SOCHx:   reports that she quit smoking about 41 years ago. Her smoking use included Cigarettes. She has a 5 pack-year smoking history. She has never used smokeless tobacco. She reports that she drinks about 4.2 oz of alcohol per week. She reports that she does not use illicit drugs.  ALLERGIES:  Allergies  Allergen Reactions  . Amlodipine     Swelling   . Sulfonamide Derivatives Swelling    REACTION: Swelling of the face; "eyes swelled shut"  . Chocolate Other (See Comments)    Sinus problems    ROS: Pertinent items noted in HPI and remainder of comprehensive ROS otherwise negative.  HOME MEDS: Current Outpatient Prescriptions  Medication Sig Dispense Refill  . bisoprolol (ZEBETA) 10 MG tablet TAKE 2 TABLETS EVERY DAY 180 tablet 1  . Cholecalciferol (VITAMIN D) 2000 UNITS CAPS Take 4,000 Units by mouth daily.    . cloNIDine (CATAPRES) 0.1 MG tablet TAKE 1 TABLET TWICE DAILY 180 tablet 1  . fexofenadine (ALLEGRA) 180 MG tablet Take 180 mg by mouth daily as needed. For seasonal allergies    . fluticasone (FLONASE) 50 MCG/ACT nasal spray USE 1 SPRAY IN EACH NOSTRIL EVERY DAY 32 g 1  . furosemide (LASIX) 20 MG tablet Take 1 tablet (20 mg total) by mouth 2 (two) times daily. 180 tablet 1  . GELATIN PO Take 2,160 mg by mouth daily.    Marland Kitchen losartan (COZAAR) 100 MG tablet Take 1 tablet (100 mg total) by mouth daily. 90 tablet 1  . Omega-3 Fatty Acids (FISH OIL) 1000 MG CAPS Take 1,000 mg by mouth daily.    Marland Kitchen omeprazole (PRILOSEC) 40 MG capsule TAKE 1 CAPSULE (40 MG TOTAL) EVERY DAY 90 capsule 1  .  potassium chloride SA (K-DUR,KLOR-CON) 20 MEQ tablet Take 1 tablet (20 mEq total) by mouth daily. 90 tablet 1  . simvastatin (ZOCOR) 10 MG tablet TAKE 1 TABLET AT BEDTIME 90 tablet 1  . traMADol (ULTRAM) 50 MG tablet Take 1 tablet (50 mg total) by mouth every 8 (eight) hours as needed. 45 tablet 0  . zolpidem (AMBIEN) 5 MG tablet Take 1 tablet (5 mg total) by mouth at bedtime as needed for sleep (prior to sleep study). 1 tablet 0   No current facility-administered medications for this visit.    LABS/IMAGING: No results found for this or any previous visit (from the past 48 hour(s)).  No results found.  WEIGHTS: Wt Readings from Last 3 Encounters:  12/07/15 238 lb 3.2 oz (108.047 kg)  11/30/15 233 lb (105.688 kg)  10/29/15 233 lb (105.688 kg)    VITALS: BP 134/78 mmHg  Pulse 64  Ht 5\' 7"  (1.702 m)  Wt 238 lb 3.2 oz (108.047 kg)  BMI 37.30 kg/m2  SpO2 98%  LMP 07/11/1991  EXAM: Extremities: edema 1+ pedal edema  EKG: Normal sinus rhythm at 67, possible LAE  ASSESSMENT: 1. Acute diastolic CHF exacerbation 2. Palpitations-PSVT, PACs, PVCs 3. Chest pressure - low risk Myoview stress test, EF 64% (2017) 4. Dyspnea on exertion 5. Essential hypertension 6. Morbid obesity 7. Family history of coronary disease 8. Possible sleep apnea  PLAN: 1.   Mrs Owens report some improvement in her palpitations. She has had right pound weight gain with lower extremity edema which is likely acute diastolic congestive heart failure, possibly related to salt intake. I advised her to increase her Lasix to 40 mg every morning and 20 mg every afternoon for 3 days and then go back to 20 mg twice a day. She should avoid salt intake. She should follow-up with her sleep study in June. I've also provided her an Ambien to take prior to that hopefully to ensure a better ability to sleep for the study.  Follow-up with me in a few months.  Pixie Casino, MD, [facility] Attending Cardiologist [place] 12/07/2015, 12:55 PM

## 2015-12-07 NOTE — Patient Instructions (Addendum)
Your physician recommends that you schedule a follow-up appointment in: [place] with Dr. Debara Pickett  Dr. Debara Pickett has recommended increasing lasix to 40mg  in the morning and lasix 20mg  in the afternoon for 3 days then decrease to 20mg  twice daily  Your physician recommends that you continue on your current medications as directed. Please refer to the Current Medication list given to you today.  Dr. Debara Pickett prescribed Lorrin Mais 5mg  to take 30 minutes prior to sleep study - phoned in - quantity #1 with no refills - to [facility] in [place]

## 2015-12-23 ENCOUNTER — Ambulatory Visit (HOSPITAL_BASED_OUTPATIENT_CLINIC_OR_DEPARTMENT_OTHER): Payer: Medicare Other | Attending: Internal Medicine | Admitting: Cardiovascular Disease

## 2015-12-23 DIAGNOSIS — Z6837 Body mass index (BMI) 37.0-37.9, adult: Secondary | ICD-10-CM | POA: Diagnosis not present

## 2015-12-23 DIAGNOSIS — R0683 Snoring: Secondary | ICD-10-CM

## 2015-12-23 DIAGNOSIS — E669 Obesity, unspecified: Secondary | ICD-10-CM | POA: Diagnosis not present

## 2015-12-23 DIAGNOSIS — G4730 Sleep apnea, unspecified: Secondary | ICD-10-CM | POA: Diagnosis present

## 2015-12-23 DIAGNOSIS — G4733 Obstructive sleep apnea (adult) (pediatric): Secondary | ICD-10-CM | POA: Diagnosis not present

## 2015-12-23 DIAGNOSIS — G4719 Other hypersomnia: Secondary | ICD-10-CM | POA: Insufficient documentation

## 2015-12-23 DIAGNOSIS — Z79899 Other long term (current) drug therapy: Secondary | ICD-10-CM | POA: Insufficient documentation

## 2015-12-25 ENCOUNTER — Encounter (HOSPITAL_BASED_OUTPATIENT_CLINIC_OR_DEPARTMENT_OTHER): Payer: Self-pay | Admitting: Cardiovascular Disease

## 2015-12-25 NOTE — Procedures (Signed)
Patient Name: [name], [name] Date: 12/23/2015 Gender: Female D.O.B: [birth year] Age (years): 74 Referring Provider: Nadean Corwin Hilty Height (inches): 67 Interpreting Physician: Shelva Majestic MD, ABSM Weight (lbs): 235 RPSGT: Baxter Flattery BMI: 37 MRN: [id number] Neck Size: 16.00  CLINICAL INFORMATION The patient is referred for a Split night/CPAP/BiPAP titration to treat sleep apnea. Date of NPSG, Split Night or HST:  SLEEP STUDY TECHNIQUE As per the AASM Manual for the Scoring of Sleep and Associated Events v2.3 (April 2016) with a hypopnea requiring 4% desaturations. The channels recorded and monitored were frontal, central and occipital EEG, electrooculogram (EOG), submentalis EMG (chin), nasal and oral airflow, thoracic and abdominal wall motion, anterior tibialis EMG, snore microphone, electrocardiogram, and pulse oximetry. Bilevel positive airway pressure (BPAP) was initiated at the beginning of the study and titrated to treat sleep-disordered breathing.  MEDICATIONS  zolpidem (AMBIEN) 5 MG tablet 5 mg, At bedtime PRN     traMADol (ULTRAM) 50 MG tablet 50 mg, Every 8 hours PRN     simvastatin (ZOCOR) 10 MG tablet      potassium chloride SA (K-DUR,KLOR-CON) 20 MEQ tablet 20 mEq, Daily     omeprazole (PRILOSEC) 40 MG capsule      Note: Takes as needed (Written 08/06/2015 1103)   Omega-3 Fatty Acids (FISH OIL) 1000 MG CAPS 1,000 mg, Daily     losartan (COZAAR) 100 MG tablet 100 mg, Daily     GELATIN PO 2,160 mg, Daily     furosemide (LASIX) 20 MG tablet 20 mg, 2 times daily     fluticasone (FLONASE) 50 MCG/ACT nasal spray      fexofenadine (ALLEGRA) 180 MG tablet 180 mg, Daily PRN     cloNIDine (CATAPRES) 0.1 MG tablet      Cholecalciferol (VITAMIN D) 2000 UNITS CAPS 4,000 Units, Daily     bisoprolol (ZEBETA) 10 MG tablet   Medications administered by patient during sleep study : No sleep medicine administered.  RESPIRATORY PARAMETERS Optimal IPAP Pressure  (cm): 16 AHI at Optimal Pressure (/hr) 19.8 Optimal EPAP Pressure (cm): 12   Overall Minimal O2 (%): 91.00 Minimal O2 at Optimal Pressure (%): 98.0  SLEEP ARCHITECTURE Start Time: 10:03:04 PM Stop Time: 4:57:43 AM Total Time (min): 414.6 Total Sleep Time (min): 329.0 Sleep Latency (min): 1.9 Sleep Efficiency (%): 79.3 REM Latency (min): 212.5 WASO (min): 83.7 Stage N1 (%): 3.34 Stage N2 (%): 56.38 Stage N3 (%): 27.66 Stage R (%): 12.61 Supine (%): 100.00 Arousal Index (/hr): 5.3      CARDIAC DATA The 2 lead EKG demonstrated sinus rhythm. The mean heart rate was 56.46 beats per minute. Other EKG findings include: None.  LEG MOVEMENT DATA The total Periodic Limb Movements of Sleep (PLMS) were 3. The PLMS index was 0.55. A PLMS index of <15 is considered normal in adults.  IMPRESSIONS - The patient underwent a split night study; AHI was 14.9 on the diagostic portion. CPAP was initiated at 5 cm and was titrated to 7 cm; AHI at 7 cm was 92.4/h and patient was switched to BiPAP titration. At 16/12 AHI was still elevated at 19.8/hr. - Abnormal sleep architecture with prolonged latency to rub sleep. - No significant oxygen desaturation.  - Loud snoring during the diagnostic study. - No cardiac abnormalities were observed during this study. - Clinically significant periodic limb movements were not noted during this study. Arousals associated with PLMs were rare.  DIAGNOSIS - Obstructive Sleep Apnea (327.23 [G47.33 ICD-10])  RECOMMENDATIONS - Recommend an initial  trial of BiPAP Auto with an EPAP min of 8 cm and IPAP max of 25 cm with a delta of 4 cm water pressure with  heated humidification.  A Medium size Resmed Full Face Mask AirFit F20 mask was used for the titration.  - Efforts should be made to optimize nasal and oropharyngeal patency. - Avoid alcohol, sedatives and other CNS depressants that may worsen sleep apnea and disrupt normal sleep architecture. - Sleep hygiene should be reviewed  to assess factors that may improve sleep quality. - Weight management (BMI 37) and regular exercise should be initiated. - Recommend a download be obtained in 30 days and sleep clinic evaluation.  Troy Sine, MD, [place], American Board of Sleep Medicine  ELECTRONICALLY SIGNED ON:  12/25/2015, 8:51 PM [place] PH: [phone]   FX: [phone] [place]

## 2015-12-28 ENCOUNTER — Telehealth: Payer: Self-pay | Admitting: *Deleted

## 2015-12-28 ENCOUNTER — Other Ambulatory Visit: Payer: Self-pay | Admitting: *Deleted

## 2015-12-28 DIAGNOSIS — G4733 Obstructive sleep apnea (adult) (pediatric): Secondary | ICD-10-CM

## 2015-12-28 NOTE — Telephone Encounter (Signed)
Called and gave patient sleep study result and recommendations.

## 2015-12-28 NOTE — Progress Notes (Signed)
Patient referred to [facility] for BIPAP set up.

## 2016-02-01 ENCOUNTER — Other Ambulatory Visit: Payer: Self-pay | Admitting: Family

## 2016-02-01 ENCOUNTER — Encounter: Payer: Self-pay | Admitting: Family

## 2016-02-01 NOTE — Telephone Encounter (Signed)
Bisoprolol refill sent to pharmacy. Pt has wellness visit with Ashlee in 07/2016 but no future appts with PCP. Last visit was 09/2015.  Please advise follow up?

## 2016-02-29 ENCOUNTER — Encounter: Payer: Self-pay | Admitting: Internal Medicine

## 2016-03-02 ENCOUNTER — Encounter: Payer: Self-pay | Admitting: Family

## 2016-03-02 MED ORDER — SIMVASTATIN 10 MG PO TABS: 10.0000 mg | ORAL_TABLET | Freq: Every day | ORAL | 0 refills | Status: DC

## 2016-03-02 MED ORDER — SIMVASTATIN 10 MG PO TABS: 10.0000 mg | ORAL_TABLET | Freq: Every day | ORAL | 1 refills | Status: DC

## 2016-03-08 ENCOUNTER — Ambulatory Visit (INDEPENDENT_AMBULATORY_CARE_PROVIDER_SITE_OTHER): Payer: Medicare Other | Admitting: Internal Medicine

## 2016-03-08 ENCOUNTER — Encounter: Payer: Self-pay | Admitting: Internal Medicine

## 2016-03-08 ENCOUNTER — Telehealth: Payer: Self-pay | Admitting: Family

## 2016-03-08 ENCOUNTER — Other Ambulatory Visit: Payer: Self-pay | Admitting: Family

## 2016-03-08 DIAGNOSIS — I493 Ventricular premature depolarization: Secondary | ICD-10-CM | POA: Diagnosis not present

## 2016-03-08 DIAGNOSIS — I5030 Unspecified diastolic (congestive) heart failure: Secondary | ICD-10-CM | POA: Diagnosis not present

## 2016-03-08 DIAGNOSIS — G4733 Obstructive sleep apnea (adult) (pediatric): Secondary | ICD-10-CM | POA: Diagnosis not present

## 2016-03-08 HISTORY — DX: Obstructive sleep apnea (adult) (pediatric): G47.33

## 2016-03-08 NOTE — Telephone Encounter (Signed)
When called pt to schedule F/U per PCP she informed me that her cardiologist changed her frequency for taking her medication furosemide. She says that she had some fluid so she was advised to take pill 2 or 3 times a day, which ever is needed. Pt would like to f/u with PCP after taking medication for a while to be sure of no concerns.

## 2016-03-08 NOTE — Telephone Encounter (Signed)
90 day supply of clonidine sent to the pharmacy. Pt last seen by PCP 09/2015 and advised f/u in 3 months. Pt is past due. Please call pt to schedule appt with [name] soon. Thanks!

## 2016-03-08 NOTE — Patient Instructions (Addendum)
Your physician wants you to follow-up in: 6 months with Dr. Debara Pickett. You will receive a reminder letter in the mail two months in advance. If you don't receive a letter, please call our office to schedule the follow-up appointment.  Your physician has recommended you make the following change in your medication: ADJUST LASIX 20-40mg  twice daily as necessary for swelling

## 2016-03-08 NOTE — Telephone Encounter (Signed)
I see that pt scheduled appt with [name] on 03/27/16. Is there anything else need from message below?

## 2016-03-08 NOTE — Progress Notes (Signed)
OFFICE NOTE  Chief Complaint:  Follow-up  Primary Care Physician: Nance Pear., NP  HPI:  [name] is a 75 y.o. female who is currently referred to me for evaluation of palpitations. While [name] has noted some palpitations, she reports not being particularly bothered by them. Her past medical history significant for an episode of congestive heart failure back in January 2016. She presented with symptoms of anasarca including upper and lower extremity swelling. The time she was on amlodipine which was discontinued and she was started on Lasix. She had significant diuresis and an echocardiogram, however did showed normal systolic and reportedly diastolic function. Based on this findings is difficult to understand whether this was cardiac edema or some other cause of volume overload. Nonetheless, recently she's had some palpitations and was fitted with a monitor. The monitor demonstrated PACs, PVCs and an episode of SVT which was transient and spontaneously terminated. Does note that she's been having some more frequent episodes of palpitations but they are not significantly bothersome to her. She also reports some heaviness in her chest when she does activities, although she is very inactive due to her hip problem and walks with cane. She also has morbid obesity and hypertension. She does take Bystolic is one of her blood pressure medications. In addition I asked her screening questions for possible sleep apnea. She does report daytime fatigue, somnolence, poor sleep at night, witnessed snoring and occasional episodes of apnea. Her Epworth sleepiness score scale is greater than 10.  12/07/2015  [name] was seen today in follow-up of her recent stress test. This was negative for ischemia, was interpreted as low risk and showed an LVEF of 64%. She reports since her stress test her palpitations have improved quite a bit. She is on bisoprolol 10 mg twice a day. She has not yet  had her sleep study which is coming up on June 15. She does worry that she may not be able sleep well enough for that study. She typically goes to bed at 2 AM and sleeps till about 6 AM. She says she often naps during the day and this could be playing a role in her poor sleep at night. While she is tried to go to bed earlier she says she cannot fall asleep. She also reports that she's had about 5 pound weight gain over the past 2 days with notable increase in lower extremity swelling. She and her husband report increase salt intake with various foods over the holiday weekend.  03/08/2016  [name] returns back today for follow-up. She reported some improvement in her swelling with an increase in Lasix. In the interim she underwent a sleep study and was found to have significant sleep apnea requiring BiPAP. This is been fitted and she's been wearing it regularly and notes that she's had an improvement in her symptoms and energy level. Unfortunately she is grieving as she recently lost her husband to acute MI. He also had suffered with cancer for a number of years.  PMHx:  Past Medical History:  Diagnosis Date  . Allergic rhinitis   . Arthritis    "knuckles" (06/17/2012)  . 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 Breast cancer ([place])    "right" (06/17/2012)  . Cataracts, bilateral   . Exertional dyspnea   . Hypertension   . Iron deficiency anemia    "hematologist watches it" (06/17/2012)  . Obesity   . Osteoarthritis    severe right hip osteoarthritis-s/p hip replacement  .  Osteopenia 11/26/2009  . Thyroid disorder    "sees endocrinologist yearly" (06/17/2012)    Past Surgical History:  Procedure Laterality Date  . BREAST BIOPSY  ` 1992   "bilaterlly" (06/17/2012)  . EYE SURGERY  2011   cataract removal both eyes  . FRACTURE SURGERY Right 01/01/14   Has pins. Procedure performed at [facility]  07/26/2012   Procedure: CLOSED MANIPULATION HIP;  Surgeon:  Nita Sells, MD;  Location: [facility];  Service: Orthopedics;  Laterality: Right;  . MASTECTOMY  1993?   bilateral mastectomy  . TONSILLECTOMY  1949  . TOTAL HIP ARTHROPLASTY  04/2006   right hip   . TOTAL HIP REVISION  06/17/2012   "right" (06/17/2012)  . TOTAL HIP REVISION  06/17/2012   Procedure: TOTAL HIP REVISION;  Surgeon: Kerin Salen, MD;  Location: [facility];  Service: Orthopedics;  Laterality: Right;    FAMHx:  Family History  Problem Relation Age of Onset  . Heart failure Father     deceased age 1  . Diabetes Father   . Alcohol abuse Father   . Breast cancer Mother     deceased at age 22 secondary to breast cancer  . Liver disease Sister     Liver failure--deceased  . Dementia Maternal Grandmother   . Colon cancer Neg Hx   . Esophageal cancer Neg Hx   . Rectal cancer Neg Hx   . Stomach cancer Neg Hx     SOCHx:   reports that she quit smoking about 41 years ago. Her smoking use included Cigarettes. She has a 5.00 pack-year smoking history. She has never used smokeless tobacco. She reports that she drinks about 4.2 oz of alcohol per week . She reports that she does not use drugs.  ALLERGIES:  Allergies  Allergen Reactions  . Amlodipine     Swelling   . Sulfonamide Derivatives Swelling    REACTION: Swelling of the face; "eyes swelled shut"  . Chocolate Other (See Comments)    Sinus problems    ROS: Pertinent items noted in HPI and remainder of comprehensive ROS otherwise negative.  HOME MEDS: Current Outpatient Prescriptions  Medication Sig Dispense Refill  . bisoprolol (ZEBETA) 10 MG tablet TAKE 2 TABLETS EVERY DAY 180 tablet 1  . Cholecalciferol (VITAMIN D) 2000 UNITS CAPS Take 4,000 Units by mouth daily.    . cloNIDine (CATAPRES) 0.1 MG tablet TAKE 1 TABLET TWICE DAILY 180 tablet 1  . fexofenadine (ALLEGRA) 180 MG tablet Take 180 mg by mouth daily as needed. For seasonal allergies    . fluticasone (FLONASE) 50 MCG/ACT nasal spray USE 1 SPRAY IN  EACH NOSTRIL EVERY DAY 32 g 1  . furosemide (LASIX) 20 MG tablet Take 1-2 tablets by mouth twice daily (dose depending on swelling)    . GELATIN PO Take 2,160 mg by mouth daily.    Marland Kitchen losartan (COZAAR) 100 MG tablet Take 1 tablet (100 mg total) by mouth daily. 90 tablet 1  . Omega-3 Fatty Acids (FISH OIL) 1000 MG CAPS Take 1,000 mg by mouth daily.    Marland Kitchen omeprazole (PRILOSEC) 40 MG capsule TAKE 1 CAPSULE (40 MG TOTAL) EVERY DAY 90 capsule 1  . potassium chloride SA (K-DUR,KLOR-CON) 20 MEQ tablet Take 1 tablet (20 mEq total) by mouth daily. 90 tablet 1  . simvastatin (ZOCOR) 10 MG tablet Take 1 tablet (10 mg total) by mouth at bedtime. 90 tablet 1  . traMADol (ULTRAM) 50 MG tablet  Take 1 tablet (50 mg total) by mouth every 8 (eight) hours as needed. 45 tablet 0  . zolpidem (AMBIEN) 5 MG tablet Take 1 tablet (5 mg total) by mouth at bedtime as needed for sleep (prior to sleep study). 1 tablet 0   No current facility-administered medications for this visit.     LABS/IMAGING: No results found for this or any previous visit (from the past 48 hour(s)). No results found.  WEIGHTS: Wt Readings from Last 3 Encounters:  03/08/16 240 lb (108.9 kg)  12/23/15 235 lb (106.6 kg)  12/07/15 238 lb 3.2 oz (108 kg)    VITALS: BP (!) 156/82   Pulse 70   Ht 5\' 7"  (1.702 m)   Wt 240 lb (108.9 kg)   LMP 07/11/1991   BMI 37.59 kg/m   EXAM: General appearance: alert, no distress and morbidly obese Lungs: clear to auscultation bilaterally Heart: regular rate and rhythm Extremities: edema 1+ pedal edema Neurologic: Grossly normal  EKG: Deferred  ASSESSMENT: 1. Chronic diastolic CHF 2. Palpitations-PSVT, PACs, PVCs 3. Chest pressure - low risk Myoview stress test, EF 64% (2017) 4. Dyspnea on exertion 5. Essential hypertension 6. Morbid obesity 7. Family history of coronary disease 8. OSA on BIPAP  PLAN: 1.   Mrs Zywicki was found to have sleep apnea and now is on BiPAP with a noted improvement  in her symptoms. She still has some swelling and may benefit from the higher dose of Lasix as we previously had dosed her. Weight has gone up some however she is grieving with the loss of her husband recently. No other changes to her medicines today and plan to see her back in 6 months or sooner as necessary.   Pixie Casino, MD, [facility] Attending Cardiologist [place] 03/08/2016, 1:22 PM

## 2016-03-09 NOTE — Telephone Encounter (Signed)
Nothing further. Pt just wanted to make PCP aware of change.

## 2016-03-27 ENCOUNTER — Ambulatory Visit (INDEPENDENT_AMBULATORY_CARE_PROVIDER_SITE_OTHER): Payer: Medicare Other | Admitting: Family

## 2016-03-27 ENCOUNTER — Encounter: Payer: Self-pay | Admitting: Family

## 2016-03-27 VITALS — BP 147/69 | HR 66 | Temp 98.2°F | Resp 16 | Ht 67.0 in | Wt 240.0 lb

## 2016-03-27 DIAGNOSIS — E785 Hyperlipidemia, unspecified: Secondary | ICD-10-CM | POA: Diagnosis not present

## 2016-03-27 DIAGNOSIS — I10 Essential (primary) hypertension: Secondary | ICD-10-CM | POA: Diagnosis not present

## 2016-03-27 DIAGNOSIS — Z23 Encounter for immunization: Secondary | ICD-10-CM

## 2016-03-27 DIAGNOSIS — G4733 Obstructive sleep apnea (adult) (pediatric): Secondary | ICD-10-CM

## 2016-03-27 DIAGNOSIS — I509 Heart failure, unspecified: Secondary | ICD-10-CM

## 2016-03-27 LAB — LIPID PANEL
CHOL/HDL RATIO: 2
Cholesterol: 160 mg/dL (ref 0–200)
HDL: 64.9 mg/dL (ref >39.00)
LDL Cholesterol: 80 mg/dL (ref 0–99)
NONHDL: 95.24
Triglycerides: 78 mg/dL (ref 0.0–149.0)
VLDL: 15.6 mg/dL (ref 0.0–40.0)

## 2016-03-27 LAB — BASIC METABOLIC PANEL
BUN: 34 mg/dL — AB (ref 6–23)
CALCIUM: 10.4 mg/dL (ref 8.4–10.5)
CO2: 27 meq/L (ref 19–32)
CREATININE: 1.24 mg/dL — AB (ref 0.40–1.20)
Chloride: 103 mEq/L (ref 96–112)
GFR: 45.06 mL/min — AB (ref >60.00)
GLUCOSE: 101 mg/dL — AB (ref 70–99)
Potassium: 4.2 mEq/L (ref 3.5–5.1)
SODIUM: 137 meq/L (ref 135–145)

## 2016-03-27 NOTE — Patient Instructions (Signed)
Please complete lab work prior to leaving.  Follow up in 6 months.  

## 2016-03-27 NOTE — Progress Notes (Signed)
Subjective:    Patient ID: [name], female    DOB: July 03, 1941, 75 y.o.   MRN: [id number]  HPI  [name] is a 75 yr old female who presents today for follow up.  1) HTN- current meds include bisoprolol, clonidine, lasix, losartan.  BP Readings from Last 3 Encounters:  03/27/16 (!) 147/69  03/08/16 (!) 156/82  12/07/15 134/78   2) Hyperlipidemia- maintained  On simvastatin. Denies myalgia  Lab Results  Component Value Date   CHOL 139 06/08/2015   HDL 55.90 06/08/2015   LDLCALC 69 06/08/2015   LDLDIRECT 110.4 12/13/2006   TRIG 74.0 06/08/2015   CHOLHDL 2 06/08/2015   3) CHF- Since her last visit she was found to have OSA and is now on BIPAP.  She is followed by Dr. Debara Owens (carediology). Reports sob is improved since she began the Bipap. Energy has improved.   The patient lost her husband this past July. She is sad about his passing but has had a lot of support from her friends and family.   Review of Systems    see HPI  Past Medical History:  Diagnosis Date  . Allergic rhinitis   . Arthritis    "knuckles" (06/17/2012)  . 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 Breast cancer ([place])    "right" (06/17/2012)  . Cataracts, bilateral   . Exertional dyspnea   . Hypertension   . Iron deficiency anemia    "hematologist watches it" (06/17/2012)  . Obesity   . OSA treated with BiPAP 03/08/2016  . Osteoarthritis    severe right hip osteoarthritis-s/p hip replacement  . Osteopenia 11/26/2009  . Thyroid disorder    "sees endocrinologist yearly" (06/17/2012)     Social History   Social History  . Marital status: Married    Spouse name: N/A  . Number of children: N/A  . Years of education: N/A   Occupational History  . Not on file.   Social History Main Topics  . Smoking status: Former Smoker    Packs/day: 0.50    Years: 10.00    Types: Cigarettes    Quit date: 07/14/1974  . Smokeless tobacco: Never Used     Comment: quit in 1976 smoked for approx. 10 years    . Alcohol use 4.2 oz/week    7 Glasses of wine per week     Comment: drinks wine with dinner  . Drug use: No  . Sexual activity: Not Currently   Other Topics Concern  . Not on file   Social History Narrative   Retired   The patient is married and has one child and two grandchildren that live in the area.     She drinks wine with dinner.  She quit smoking in 1976, smoked for   approximately 10 years.      Moved to [place] from [place]          Past Surgical History:  Procedure Laterality Date  . BREAST BIOPSY  ` 1992   "bilaterlly" (06/17/2012)  . EYE SURGERY  2011   cataract removal both eyes  . FRACTURE SURGERY Right 01/01/14   Has pins. Procedure performed at [facility]  07/26/2012   Procedure: CLOSED MANIPULATION HIP;  Surgeon: Nita Sells, MD;  Location: [facility];  Service: Orthopedics;  Laterality: Right;  . MASTECTOMY  1993?   bilateral mastectomy  . TONSILLECTOMY  1949  . TOTAL HIP ARTHROPLASTY  04/2006   right hip   .  TOTAL HIP REVISION  06/17/2012   "right" (06/17/2012)  . TOTAL HIP REVISION  06/17/2012   Procedure: TOTAL HIP REVISION;  Surgeon: Kerin Salen, MD;  Location: [facility];  Service: Orthopedics;  Laterality: Right;    Family History  Problem Relation Age of Onset  . Heart failure Father     deceased age 34  . Diabetes Father   . Alcohol abuse Father   . Breast cancer Mother     deceased at age 34 secondary to breast cancer  . Liver disease Sister     Liver failure--deceased  . Dementia Maternal Grandmother   . Colon cancer Neg Hx   . Esophageal cancer Neg Hx   . Rectal cancer Neg Hx   . Stomach cancer Neg Hx     Allergies  Allergen Reactions  . Amlodipine     Swelling   . Sulfonamide Derivatives Swelling    REACTION: Swelling of the face; "eyes swelled shut"  . Chocolate Other (See Comments)    Sinus problems    Current Outpatient Prescriptions on File Prior to Visit  Medication Sig  Dispense Refill  . bisoprolol (ZEBETA) 10 MG tablet TAKE 2 TABLETS EVERY DAY 180 tablet 1  . Cholecalciferol (VITAMIN D) 2000 UNITS CAPS Take 4,000 Units by mouth daily.    . cloNIDine (CATAPRES) 0.1 MG tablet TAKE 1 TABLET TWICE DAILY 180 tablet 0  . fexofenadine (ALLEGRA) 180 MG tablet Take 180 mg by mouth daily as needed. For seasonal allergies    . fluticasone (FLONASE) 50 MCG/ACT nasal spray USE 1 SPRAY IN EACH NOSTRIL EVERY DAY 32 g 1  . furosemide (LASIX) 20 MG tablet Take 1 tablet in the morning and 2 tablets around 3pm.    . GELATIN PO Take 2,160 mg by mouth daily.    Marland Kitchen losartan (COZAAR) 100 MG tablet Take 1 tablet (100 mg total) by mouth daily. 90 tablet 1  . Omega-3 Fatty Acids (FISH OIL) 1000 MG CAPS Take 1,000 mg by mouth daily.    Marland Kitchen omeprazole (PRILOSEC) 40 MG capsule TAKE 1 CAPSULE (40 MG TOTAL) EVERY DAY 90 capsule 1  . potassium chloride SA (K-DUR,KLOR-CON) 20 MEQ tablet Take 1 tablet (20 mEq total) by mouth daily. 90 tablet 1  . simvastatin (ZOCOR) 10 MG tablet Take 1 tablet (10 mg total) by mouth at bedtime. 90 tablet 1   No current facility-administered medications on file prior to visit.     BP (!) 147/69 (BP Location: Right Arm, Cuff Size: Large)   Pulse 66   Temp 98.2 F (36.8 C) (Oral)   Resp 16   Ht 5\' 7"  (1.702 m)   Wt 240 lb (108.9 kg)   LMP 07/11/1991   SpO2 100% Comment: room air  BMI 37.59 kg/m    Objective:   Physical Exam  Constitutional: She appears well-developed and well-nourished.  Cardiovascular: Normal rate, regular rhythm and normal heart sounds.   No murmur heard. Pulmonary/Chest: Effort normal and breath sounds normal. No respiratory distress. She has no wheezes.  Musculoskeletal:  2-3+ bilateral LE edema  Psychiatric: She has a normal mood and affect. Her behavior is normal. Judgment and thought content normal.          Assessment & Plan:  Flu shot today.

## 2016-03-27 NOTE — Assessment & Plan Note (Signed)
Stable on statin, continue same. Obtain follow up lipid panel.

## 2016-03-27 NOTE — Assessment & Plan Note (Signed)
BP stable on current medications, continue same, obtain bmet

## 2016-03-27 NOTE — Progress Notes (Signed)
Pre visit review using our clinic review tool, if applicable. No additional management support is needed unless otherwise documented below in the visit note. 

## 2016-03-27 NOTE — Assessment & Plan Note (Signed)
Clinically stable on current dose of lasix. Obtain follow up bmet to assess electrolytes. Management per cardiology. Dr. Debara Pickett.

## 2016-03-27 NOTE — Assessment & Plan Note (Signed)
Clinically improved since starting Bipap, continue same.

## 2016-03-28 ENCOUNTER — Encounter: Payer: Self-pay | Admitting: Family

## 2016-04-10 ENCOUNTER — Encounter: Payer: Self-pay | Admitting: Internal Medicine

## 2016-04-19 DIAGNOSIS — M545 Low back pain: Secondary | ICD-10-CM | POA: Diagnosis not present

## 2016-04-20 ENCOUNTER — Telehealth: Payer: Self-pay | Admitting: *Deleted

## 2016-04-20 NOTE — Telephone Encounter (Signed)
-----   Message from Troy Sine, MD sent at 04/16/2016  9:10 PM EDT ----- Meeting CPAP compliance on CPAP Auto

## 2016-04-20 NOTE — Telephone Encounter (Signed)
Called patient with CPAP compliance results and recommendations. Patient informs me that her husband passed away in Jan 21, 2023 and she has a hard time sleeping. She will typically go to bed between 12-1 AM and wake up at 7am. She uses melatonin without success. Wants to know if Dr Claiborne Billings has any recommendations. Note routed to Dr Claiborne Billings for review and recommendation.

## 2016-04-30 NOTE — Telephone Encounter (Signed)
Ok to try zolpidem 5 mg hs PRN if no contraindication

## 2016-05-01 ENCOUNTER — Other Ambulatory Visit: Payer: Self-pay | Admitting: *Deleted

## 2016-05-01 MED ORDER — ZOLPIDEM TARTRATE 5 MG PO TABS: 5.0000 mg | ORAL_TABLET | Freq: Every evening | ORAL | 1 refills | Status: DC | PRN

## 2016-05-01 NOTE — Telephone Encounter (Signed)
Patient notified per Dr Claiborne Billings we will try zolpidem to take at night as needed to help her to sleep.

## 2016-05-01 NOTE — Telephone Encounter (Signed)
Zolpidem 5 mg phoned into [facility] [address] [place],, [place] spoke with  [employer]. Prescription phone per verbal order by Dr Claiborne Billings.

## 2016-05-09 ENCOUNTER — Encounter: Payer: Self-pay | Admitting: Internal Medicine

## 2016-05-09 ENCOUNTER — Telehealth: Payer: Self-pay | Admitting: Internal Medicine

## 2016-05-09 NOTE — Telephone Encounter (Signed)
[name] is calling because [facility] is asking for Compliant notes for her C-pap. Please call

## 2016-05-16 DIAGNOSIS — H26492 Other secondary cataract, left eye: Secondary | ICD-10-CM | POA: Diagnosis not present

## 2016-05-16 DIAGNOSIS — H52203 Unspecified astigmatism, bilateral: Secondary | ICD-10-CM | POA: Diagnosis not present

## 2016-05-16 DIAGNOSIS — Z961 Presence of intraocular lens: Secondary | ICD-10-CM | POA: Diagnosis not present

## 2016-05-16 DIAGNOSIS — H43813 Vitreous degeneration, bilateral: Secondary | ICD-10-CM | POA: Diagnosis not present

## 2016-05-24 ENCOUNTER — Other Ambulatory Visit: Payer: Self-pay | Admitting: Family

## 2016-05-26 DIAGNOSIS — M272 Inflammatory conditions of jaws: Secondary | ICD-10-CM | POA: Diagnosis not present

## 2016-05-26 DIAGNOSIS — K045 Chronic apical periodontitis: Secondary | ICD-10-CM | POA: Diagnosis not present

## 2016-06-08 DIAGNOSIS — H26492 Other secondary cataract, left eye: Secondary | ICD-10-CM | POA: Diagnosis not present

## 2016-06-09 ENCOUNTER — Telehealth: Payer: Self-pay

## 2016-06-09 NOTE — Telephone Encounter (Signed)
I found 3 Rx's in the container up front.   The first one was Focalin 5 MG that was approved and printed  on 05/14/15 and was not picked up so it was shredded.   The second one was Xanax that was approved and printed  on 06/07/15 and was not picked up. It was shredded as well.   The third one was Ultram that was approved and printed on 08/10/15 and was not picked up. It was shredded.

## 2016-06-24 ENCOUNTER — Other Ambulatory Visit: Payer: Self-pay | Admitting: Family

## 2016-06-24 ENCOUNTER — Encounter: Payer: Self-pay | Admitting: Family

## 2016-06-26 ENCOUNTER — Encounter: Payer: Self-pay | Admitting: Family

## 2016-06-26 MED ORDER — TRAMADOL HCL 50 MG PO TABS: 50.0000 mg | ORAL_TABLET | Freq: Three times a day (TID) | ORAL | 0 refills | Status: DC | PRN

## 2016-06-26 NOTE — Telephone Encounter (Signed)
See rx and controlled substance contract.

## 2016-06-26 NOTE — Telephone Encounter (Signed)
Refill sent per [facility] refill protocol/SLS  

## 2016-06-27 NOTE — Telephone Encounter (Signed)
Rx and contract placed at front desk for pick up.

## 2016-07-06 ENCOUNTER — Encounter: Payer: Self-pay | Admitting: Family

## 2016-07-06 DIAGNOSIS — Z79891 Long term (current) use of opiate analgesic: Secondary | ICD-10-CM | POA: Diagnosis not present

## 2016-07-14 ENCOUNTER — Other Ambulatory Visit: Payer: Self-pay | Admitting: *Deleted

## 2016-07-14 DIAGNOSIS — G4733 Obstructive sleep apnea (adult) (pediatric): Secondary | ICD-10-CM

## 2016-07-28 ENCOUNTER — Other Ambulatory Visit: Payer: Self-pay | Admitting: Family

## 2016-08-07 ENCOUNTER — Ambulatory Visit: Payer: Medicare Other | Admitting: *Deleted

## 2016-08-10 ENCOUNTER — Ambulatory Visit (INDEPENDENT_AMBULATORY_CARE_PROVIDER_SITE_OTHER): Payer: Medicare Other | Admitting: *Deleted

## 2016-08-10 ENCOUNTER — Encounter: Payer: Self-pay | Admitting: *Deleted

## 2016-08-10 VITALS — BP 140/70 | HR 69 | Resp 16 | Ht 67.0 in | Wt 234.2 lb

## 2016-08-10 DIAGNOSIS — I10 Essential (primary) hypertension: Secondary | ICD-10-CM

## 2016-08-10 DIAGNOSIS — Z780 Asymptomatic menopausal state: Secondary | ICD-10-CM | POA: Diagnosis not present

## 2016-08-10 DIAGNOSIS — Z0000 Encounter for general adult medical examination without abnormal findings: Secondary | ICD-10-CM | POA: Diagnosis not present

## 2016-08-10 NOTE — Progress Notes (Signed)
Have read and agree with note by Leandra Kern, RN

## 2016-08-10 NOTE — Progress Notes (Signed)
Pre visit review using our clinic review tool, if applicable. No additional management support is needed unless otherwise documented below in the visit note. 

## 2016-08-10 NOTE — Assessment & Plan Note (Signed)
BP elevated, seems to be baseline for pt based on previous readings. She reports compliance w/ medication. Has upcoming f/u w/ both PCP and cardiology.

## 2016-08-10 NOTE — Progress Notes (Signed)
Subjective:   [name] is a 76 y.o. female who presents for Medicare Annual (Subsequent) preventive examination.  Pt lost her husband of 35 years last July and is still grieving his loss. She is tearful when discussing this.  Review of Systems:  No ROS.  Medicare Wellness Visit.  Cardiac Risk Factors include: advanced age (>42men, >62 women);dyslipidemia;sedentary lifestyle;obesity (BMI >30kg/m2);hypertension  Sleep patterns: no sleep issues, does not get up to void and sleeps 6 hours nightly. Wears CPAP nightly. Home Safety/Smoke Alarms: Feels safe in home. Smoke alarms in place.   Living environment; residence and Firearm Safety: Lives alone w/ dog. 1-story house/ trailer, walk-in shower, equipment: Cane, Type: [place] [employer], Type: grab bars in shower, firearms stored safely. Seat Belt Safety/Bike Helmet: Wears seat belt.   Counseling:   Eye Exam- Dr. Satira Sark yearly Dental- Dr. Gilford Raid every 6 months  Female:   Pap- Aged out      Mammo- N/A, bilateral mastectomy  Dexa scan- last 03/12/13. Normal.        CCS- last 09/06/15 w/ Dr. Zenovia Jarred. Two sessile polyps removed, mild diverticulosis in the sigmoid colon. 5 year recall.    Objective:     Vitals: BP 140/70 (BP Location: Right Arm, Patient Position: Sitting, Cuff Size: Large)   Pulse 69   Resp 16   Ht 5\' 7"  (1.702 m)   Wt 234 lb 3.2 oz (106.2 kg)   LMP 07/11/1991   SpO2 99%   BMI 36.68 kg/m   Body mass index is 36.68 kg/m.   BP Readings from Last 3 Encounters:  08/10/16 140/70  03/27/16 (!) 147/69  03/08/16 (!) 156/82   Tobacco History  Smoking Status  . Former Smoker  . Packs/day: 0.50  . Years: 10.00  . Types: Cigarettes  . Quit date: 07/14/1974  Smokeless Tobacco  . Never Used    Comment: quit in 1976 smoked for approx. 10 years     Counseling given: Not Answered   Past Medical History:  Diagnosis Date  . Allergic rhinitis   . Arthritis    "knuckles" (06/17/2012)  .  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 Breast cancer ([place])    "right" (06/17/2012)  . Cataracts, bilateral   . Exertional dyspnea   . Hypertension   . Iron deficiency anemia    "hematologist watches it" (06/17/2012)  . Obesity   . OSA treated with BiPAP 03/08/2016  . Osteoarthritis    severe right hip osteoarthritis-s/p hip replacement  . Osteopenia 11/26/2009  . Thyroid disorder    "sees endocrinologist yearly" (06/17/2012)   Past Surgical History:  Procedure Laterality Date  . BREAST BIOPSY  ` 1992   "bilaterlly" (06/17/2012)  . EYE SURGERY  2011   cataract removal both eyes  . FRACTURE SURGERY Right 01/01/14   Has pins. Procedure performed at [facility]  07/26/2012   Procedure: CLOSED MANIPULATION HIP;  Surgeon: Nita Sells, MD;  Location: [facility];  Service: Orthopedics;  Laterality: Right;  . MASTECTOMY  1993?   bilateral mastectomy  . TONSILLECTOMY  1949  . TOTAL HIP ARTHROPLASTY  04/2006   right hip   . TOTAL HIP REVISION  06/17/2012   "right" (06/17/2012)  . TOTAL HIP REVISION  06/17/2012   Procedure: TOTAL HIP REVISION;  Surgeon: Kerin Salen, MD;  Location: [facility];  Service: Orthopedics;  Laterality: Right;   Family History  Problem Relation Age of Onset  . Heart failure  Father     deceased age 73  . Diabetes Father   . Alcohol abuse Father   . Breast cancer Mother     deceased at age 71 secondary to breast cancer  . Liver disease Sister     Liver failure--deceased  . Dementia Maternal Grandmother   . Colon cancer Neg Hx   . Esophageal cancer Neg Hx   . Rectal cancer Neg Hx   . Stomach cancer Neg Hx    History  Sexual Activity  . Sexual activity: No    Outpatient Encounter Prescriptions as of 08/10/2016  Medication Sig  . acetaminophen (ACETAMINOPHEN EXTRA STRENGTH) 500 MG tablet Take by mouth at bedtime as needed.  . bisoprolol (ZEBETA) 10 MG tablet TAKE 2 TABLETS EVERY DAY  . Cholecalciferol (VITAMIN D) 2000 UNITS  CAPS Take 4,000 Units by mouth daily.  . cloNIDine (CATAPRES) 0.1 MG tablet TAKE 1 TABLET TWICE DAILY  . diphenhydrAMINE (BENADRYL) 25 MG tablet Take 25 mg by mouth at bedtime as needed.  . fexofenadine (ALLEGRA) 180 MG tablet Take 180 mg by mouth daily as needed. For seasonal allergies  . fluticasone (FLONASE) 50 MCG/ACT nasal spray USE 1 SPRAY IN EACH NOSTRIL EVERY DAY  . furosemide (LASIX) 20 MG tablet Take 1 tablet in the morning and 2 tablets around 3pm.  . GELATIN PO Take 2,160 mg by mouth daily.  Marland Kitchen losartan (COZAAR) 100 MG tablet TAKE 1 TABLET EVERY DAY  . MELATONIN PO Take 5 mg by mouth at bedtime as needed.  . Omega-3 Fatty Acids (FISH OIL) 1000 MG CAPS Take 1,000 mg by mouth daily.  Marland Kitchen omeprazole (PRILOSEC) 40 MG capsule TAKE 1 CAPSULE (40 MG TOTAL) EVERY DAY  . potassium chloride SA (K-DUR,KLOR-CON) 20 MEQ tablet TAKE 1 TABLET EVERY DAY  . simvastatin (ZOCOR) 10 MG tablet TAKE 1 TABLET AT BEDTIME  . traMADol (ULTRAM) 50 MG tablet Take 1 tablet (50 mg total) by mouth every 8 (eight) hours as needed.  . zolpidem (AMBIEN) 5 MG tablet Take 1 tablet (5 mg total) by mouth at bedtime as needed for sleep.  . meloxicam (MOBIC) 15 MG tablet Take 15 mg by mouth daily as needed. with food   No facility-administered encounter medications on file as of 08/10/2016.     Activities of Daily Living In your present state of health, do you have any difficulty performing the following activities: 08/10/2016  Hearing? N  Vision? N  Difficulty concentrating or making decisions? N  Walking or climbing stairs? Y  Dressing or bathing? N  Doing errands, shopping? N  Preparing Food and eating ? N  Using the Toilet? Y  In the past six months, have you accidently leaked urine? N  Do you have problems with loss of bowel control? N  Managing your Medications? N  Managing your Finances? N  Housekeeping or managing your Housekeeping? N  Some recent data might be hidden    Patient Care Team: Debbrah Alar, NP as PCP - General (Internal Medicine) Volanda Napoleon, MD as Consulting Physician (Hematology and Oncology) Marygrace Drought, MD as Consulting Physician (Ophthalmology) Frederik Pear, MD as Consulting Physician (Orthopedic Surgery) Pixie Casino, MD as Consulting Physician (Cardiology) Troy Sine, MD as Consulting Physician (Cardiology)    Assessment:    Physical assessment deferred to PCP.  Exercise Activities and Dietary recommendations Current Exercise Habits: Home exercise routine, Type of exercise: walking, Time (Minutes): 60, Frequency (Times/Week): 7, Weekly Exercise (Minutes/Week): 420  Diet (meal preparation,  eat out, water intake, caffeinated beverages, dairy products, fruits and vegetables): in general, a "healthy" diet  , well balanced, on average, 3 meals per day. Eats most meals at home. Lots of microwave meals/Lean Cuisine and salad w/ dinner. Snacks on tangerines. Breakfast: OJ and toast and oatmeal. Drinks water throughout the day. Drinks coffee in the morning. Lunch: soup  Dinner: Marine scientist w/ salad  Goals    . Lose 20 lbs by next year.   (pt-stated)          Increase physical activity----Silver Sneakers at the [employer]       Fall Risk Fall Risk  08/10/2016 08/06/2015 06/24/2014 03/05/2013  Falls in the past year? Yes Yes Yes No  Number falls in past yr: 1 1 2  or more -  Injury with Fall? - Yes - -  Risk for fall due to : History of fall(s);Impaired balance/gait Impaired balance/gait;Impaired mobility History of fall(s);Impaired mobility History of fall(s)  Follow up Falls prevention discussed Education provided;Falls prevention discussed - -   Depression Screen PHQ 2/9 Scores 08/10/2016 08/06/2015 06/24/2014 03/05/2013  PHQ - 2 Score 4 1 6  0  PHQ- 9 Score 10 - 9 -     Cognitive Function MMSE - Mini Mental State Exam 08/10/2016 08/06/2015  Orientation to time 5 5  Orientation to Place 5 5  Registration 3 3  Attention/ Calculation 5 5    Recall 3 3  Language- name 2 objects 2 2  Language- repeat 1 1  Language- follow 3 step command 3 3  Language- read & follow direction 1 1  Write a sentence 1 1  Copy design 1 1  Total score 30 30        Immunization History  Administered Date(s) Administered  . Influenza Split 03/22/2012  . Influenza Whole 04/28/2008, 05/04/2009  . Influenza, High Dose Seasonal PF 04/01/2015, 03/27/2016  . Influenza,inj,Quad PF,36+ Mos 03/19/2013, 03/25/2014  . Pneumococcal Conjugate-13 06/25/2013  . Pneumococcal Polysaccharide-23 05/27/2001, 06/18/2012  . Td 05/26/2008  . Zoster 12/01/2010   Screening Tests Health Maintenance  Topic Date Due  . TETANUS/TDAP  05/26/2018  . COLONOSCOPY  09/05/2020  . INFLUENZA VACCINE  Completed  . DEXA SCAN  Completed  . ZOSTAVAX  Completed  . PNA vac Low Risk Adult  Completed      Plan:    Follow-up w/ PCP as scheduled.  DEXA ordered.  Appropriately grieving loss of husband. Continue frequent contacts w/ family and increasing activities outside the house. Continue doing brain stimulating activities (puzzles, reading, adult coloring books, staying active) to keep memory sharp.   During the course of the visit the patient was educated and counseled about the following appropriate screening and preventive services:   Vaccines to include Pneumoccal, Influenza, Hepatitis B, Td, Zostavax, HCV  Cardiovascular Disease  Colorectal cancer screening  Bone density screening  Diabetes screening  Glaucoma screening  Nutrition counseling   Patient Instructions (the written plan) was given to the patient.   Dorrene German, RN  08/10/2016

## 2016-08-10 NOTE — Patient Instructions (Addendum)
  [name] , Thank you for taking time to come for your Medicare Wellness Visit. I appreciate your ongoing commitment to your health goals. Please review the following plan we discussed and let me know if I can assist you in the future.   You can stop by the imaging department downstairs to schedule your bone density scan if you would like.  These are the goals we discussed: Goals    . Lose 20 lbs by next year.   (pt-stated)          Increase physical activity----Silver Sneakers at the [employer]        This is a list of the screening recommended for you and due dates:  Health Maintenance  Topic Date Due  . Tetanus Vaccine  05/26/2018  . Colon Cancer Screening  09/05/2020  . Flu Shot  Completed  . DEXA scan (bone density measurement)  Completed  . Shingles Vaccine  Completed  . Pneumonia vaccines  Completed

## 2016-08-17 ENCOUNTER — Ambulatory Visit (HOSPITAL_BASED_OUTPATIENT_CLINIC_OR_DEPARTMENT_OTHER)
Admission: RE | Admit: 2016-08-17 | Discharge: 2016-08-17 | Disposition: A | Discharge status: Home / Self Care | Payer: Medicare Other | Source: Ambulatory Visit | Attending: Family | Admitting: Family

## 2016-08-17 DIAGNOSIS — M8580 Other specified disorders of bone density and structure, unspecified site: Secondary | ICD-10-CM | POA: Insufficient documentation

## 2016-08-17 DIAGNOSIS — Z96641 Presence of right artificial hip joint: Secondary | ICD-10-CM | POA: Diagnosis not present

## 2016-08-17 DIAGNOSIS — Z780 Asymptomatic menopausal state: Secondary | ICD-10-CM | POA: Insufficient documentation

## 2016-08-17 DIAGNOSIS — M85852 Other specified disorders of bone density and structure, left thigh: Secondary | ICD-10-CM | POA: Diagnosis not present

## 2016-08-22 ENCOUNTER — Encounter: Payer: Self-pay | Admitting: Family

## 2016-08-30 ENCOUNTER — Ambulatory Visit (INDEPENDENT_AMBULATORY_CARE_PROVIDER_SITE_OTHER): Payer: Medicare Other | Admitting: Cardiovascular Disease

## 2016-08-30 ENCOUNTER — Encounter: Payer: Self-pay | Admitting: Cardiovascular Disease

## 2016-08-30 VITALS — BP 149/74 | HR 66 | Ht 66.0 in | Wt 238.8 lb

## 2016-08-30 DIAGNOSIS — I10 Essential (primary) hypertension: Secondary | ICD-10-CM

## 2016-08-30 DIAGNOSIS — G4733 Obstructive sleep apnea (adult) (pediatric): Secondary | ICD-10-CM

## 2016-08-30 DIAGNOSIS — G4719 Other hypersomnia: Secondary | ICD-10-CM | POA: Diagnosis not present

## 2016-08-30 NOTE — Patient Instructions (Signed)
Your physician wants you to follow-up in: 1 year or sooner if needed for sleep with Dr Claiborne Billings. You will receive a reminder letter in the mail two months in advance. If you don't receive a letter, please call our office to schedule the follow-up appointment.

## 2016-09-04 ENCOUNTER — Ambulatory Visit: Payer: Medicare Other | Admitting: Internal Medicine

## 2016-09-06 NOTE — Progress Notes (Signed)
Cardiology Office Note    Date:  09/06/2016   ID:  [name], DOB 05-Sep-1940, MRN [id number]  PCP:  Nance Pear., NP  Cardiologist:  Shelva Majestic, MD (sleep); Dr. Debara Pickett  Initial sleep clinic evaluation  History of Present Illness:  [name] is a 76 y.o. female who is referred for a sleep clinic evaluation after initiating BiPAP therapy.  [name] is a 76 year old patient who is followed by Dr. Debara Pickett, for primary cardiology care.  She has a history of palpitations and has  documented PACs and PVCs.  Due to concerns for a sleep apneashe was referred by Dr. Debara Pickett for a sleep study.  This was done on 12/23/2015.  She underwent a split-night protocol and her AHI overall on the diagnostic portion was 14.9, placing her just about in the moderate sleep apnea category.  She had abnormal sleep architecture with prolonged latency to in rem sleep.  There was loud snoring.  CPAP titration was initiated at 5 cm and she was titrated only to 7 cm but apparently due to intolerability was ultimately switched to BiPAP titration.  At 16/12 pressure, her AHI was still elevated at 19.8.  For this reason, I recommended initiation of therapy with a BiPAP auto unit and initially said an EPAP minimum pressure of 8 with an IPAP maximum up to 25 and 4 cm pressure support.  Her DME company is [employer].  An initial download from 01/07/2016 through 02/05/2016 showed that she was meeting Medicare compliance standards.  However, she was only sleeping 4 hours and 58 minutes.  Her AHI was increased that 11.1.  A subsequent download from January 20 118 through 08/28/2016 showed 100% compliance and she was now averaging almost 6 hours of sleep per night.  Her AHI had improved to 5.0.  Her 95th percent EPAP pressure is 13.5.  Her maximum IPAP pressure was 16.8.  Since initiating CPAP therapy she has felt improved; he is sleeping better and has more energy.  She at times still admits to some daytime  sleepiness.  She is unaware of breakthrough snoring.  An Epworth Sleepiness scale was recalculated in the office today and this endorsed at 12 and shown below.  Epworth Sleepiness Scale: Situation   Chance of Dozing/Sleeping (0 = never , 1 = slight chance , 2 = moderate chance , 3 = high chance )   sitting and reading 2   watching TV 2   sitting inactive in a public place 1   being a passenger in a motor vehicle for an hour or more 3   lying down in the afternoon 3   sitting and talking to someone 0   sitting quietly after lunch (no alcohol) 1   while stopped for a few minutes in traffic as the driver 0   Total Score  12       Past Medical History:  Diagnosis Date  . Allergic rhinitis   . Arthritis    "knuckles" (06/17/2012)  . 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 Breast cancer ([place])    "right" (06/17/2012)  . Cataracts, bilateral   . Exertional dyspnea   . Hypertension   . Iron deficiency anemia    "hematologist watches it" (06/17/2012)  . Obesity   . OSA treated with BiPAP 03/08/2016  . Osteoarthritis    severe right hip osteoarthritis-s/p hip replacement  . Osteopenia 11/26/2009  . Thyroid disorder    "sees endocrinologist yearly" (06/17/2012)    Past Surgical History:  Procedure Laterality Date  . BREAST BIOPSY  ` 1992   "bilaterlly" (06/17/2012)  . EYE SURGERY  2011   cataract removal both eyes  . FRACTURE SURGERY Right 01/01/14   Has pins. Procedure performed at [facility]  07/26/2012   Procedure: CLOSED MANIPULATION HIP;  Surgeon: Nita Sells, MD;  Location: [facility];  Service: Orthopedics;  Laterality: Right;  . MASTECTOMY  1993?   bilateral mastectomy  . TONSILLECTOMY  1949  . TOTAL HIP ARTHROPLASTY  04/2006   right hip   . TOTAL HIP REVISION  06/17/2012   "right" (06/17/2012)  . TOTAL HIP REVISION  06/17/2012   Procedure: TOTAL HIP REVISION;  Surgeon: Kerin Salen, MD;  Location: [facility];  Service: Orthopedics;   Laterality: Right;    Current Medications: Outpatient Medications Prior to Visit  Medication Sig Dispense Refill  . acetaminophen (ACETAMINOPHEN EXTRA STRENGTH) 500 MG tablet Take 1,000 mg by mouth at bedtime as needed.     . bisoprolol (ZEBETA) 10 MG tablet TAKE 2 TABLETS EVERY DAY 180 tablet 1  . Cholecalciferol (VITAMIN D) 2000 UNITS CAPS Take 4,000 Units by mouth daily.    . cloNIDine (CATAPRES) 0.1 MG tablet TAKE 1 TABLET TWICE DAILY 180 tablet 0  . diphenhydrAMINE (BENADRYL) 25 MG tablet Take 25 mg by mouth at bedtime as needed.    . fexofenadine (ALLEGRA) 180 MG tablet Take 180 mg by mouth daily as needed. For seasonal allergies    . fluticasone (FLONASE) 50 MCG/ACT nasal spray USE 1 SPRAY IN EACH NOSTRIL EVERY DAY 32 g 1  . furosemide (LASIX) 20 MG tablet Take 1 tablet in the morning and 1 tablet after 3pm. 2 tablets in the afternoon PRN swelling.    Marland Kitchen GELATIN PO Take 2,160 mg by mouth daily.    Marland Kitchen losartan (COZAAR) 100 MG tablet TAKE 1 TABLET EVERY DAY 90 tablet 0  . MELATONIN PO Take 5 mg by mouth at bedtime as needed.    . meloxicam (MOBIC) 15 MG tablet Take 15 mg by mouth daily as needed. with food  1  . Omega-3 Fatty Acids (FISH OIL) 1000 MG CAPS Take 1,000 mg by mouth daily.    Marland Kitchen omeprazole (PRILOSEC) 40 MG capsule TAKE 1 CAPSULE (40 MG TOTAL) EVERY DAY 90 capsule 1  . potassium chloride SA (K-DUR,KLOR-CON) 20 MEQ tablet TAKE 1 TABLET EVERY DAY 90 tablet 1  . simvastatin (ZOCOR) 10 MG tablet TAKE 1 TABLET AT BEDTIME 90 tablet 0  . traMADol (ULTRAM) 50 MG tablet Take 1 tablet (50 mg total) by mouth every 8 (eight) hours as needed. 30 tablet 0  . zolpidem (AMBIEN) 5 MG tablet Take 1 tablet (5 mg total) by mouth at bedtime as needed for sleep. 30 tablet 1   No facility-administered medications prior to visit.      Allergies:   Amlodipine; Sulfonamide derivatives; and Chocolate   Social History   Social History  . Marital status: Married    Spouse name: N/A  . Number of  children: N/A  . Years of education: N/A   Social History Main Topics  . Smoking status: Former Smoker    Packs/day: 0.50    Years: 10.00    Types: Cigarettes    Quit date: 07/14/1974  . Smokeless tobacco: Never Used     Comment: quit in 1976 smoked for approx. 10 years  . Alcohol use 4.2 oz/week    7 Glasses of wine per week  Comment: drinks wine with dinner  . Drug use: No  . Sexual activity: No   Other Topics Concern  . None   Social History Narrative   Retired   The patient is married and has one child and two grandchildren that live in the area.     She drinks wine with dinner.  She quit smoking in 1976, smoked for   approximately 10 years.      Moved to [place] from [place], [place]           Family History:  The patient's family history includes Alcohol abuse in her father; Breast cancer in her mother; Dementia in her maternal grandmother; Diabetes in her father; Heart failure in her father; Liver disease in her sister.   ROS General: Negative; No fevers, chills, or night sweats;  HEENT: Negative; No changes in vision or hearing, sinus congestion, difficulty swallowing Pulmonary: Negative; No cough, wheezing, shortness of breath, hemoptysis Cardiovascular: History of palpitations GI: Negative; No nausea, vomiting, diarrhea, or abdominal pain GU: Negative; No dysuria, hematuria, or difficulty voiding Musculoskeletal: Negative; no myalgias, joint pain, or weakness Hematologic/Oncology: Negative; no easy bruising, bleeding Endocrine: Negative; no heat/cold intolerance; no diabetes Neuro: Negative; no changes in balance, headaches Skin: Negative; No rashes or skin lesions Psychiatric: Negative; No behavioral problems, depression Sleep: Positive for OSA, mild daytime sleepiness, no bruxism, restless legs, hypnogognic hallucinations, no cataplexy Other comprehensive 14 point system review is negative.   PHYSICAL EXAM:   VS:  BP (!) 149/74   Pulse 66   Ht '5\' 6"'$   (1.676 m)   Wt 238 lb 12.8 oz (108.3 kg)   LMP 07/11/1991   BMI 38.54 kg/m    Repeat blood pressure by me was 130/78   Wt Readings from Last 3 Encounters:  08/30/16 238 lb 12.8 oz (108.3 kg)  08/10/16 234 lb 3.2 oz (106.2 kg)  03/27/16 240 lb (108.9 kg)    General: Alert, oriented, no distress.  Skin: normal turgor, no rashes, warm and dry HEENT: Normocephalic, atraumatic. Pupils equal round and reactive to light; sclera anicteric; extraocular muscles intact; Fundi Disc flat, without hemorrhages or exudates Nose without nasal septal hypertrophy Mouth/Parynx benign; Mallinpatti scale 3 Neck: No JVD, no carotid bruits; normal carotid upstroke Lungs: clear to ausculatation and percussion; no wheezing or rales Chest wall: without tenderness to palpitation Heart: PMI not displaced, RRR, s1 s2 normal, 1/6 systolic murmur, no diastolic murmur, no rubs, gallops, thrills, or heaves Abdomen: soft, nontender; no hepatosplenomehaly, BS+; abdominal aorta nontender and not dilated by palpation. Back: no CVA tenderness Pulses 2+ Musculoskeletal: full range of motion, normal strength, no joint deformities Extremities: no clubbing cyanosis or edema, Homan's sign negative  Neurologic: grossly nonfocal; Cranial nerves grossly wnl Psychologic: Normal mood and affect   Studies/Labs Reviewed:   EKG:  EKG is not  ordered today.  I have personally reviewed her last ECG from 03/08/2016 which showed normal sinus rhythm at 70; no ectopy, normal intervals.  Recent Labs: BMP Latest Ref Rng & Units 03/27/2016 09/22/2015 08/06/2015  Glucose 70 - 99 mg/dL 101(H) 114(H) 113(H)  BUN 6 - 23 mg/dL 34(H) 25(H) 28(H)  Creatinine 0.40 - 1.20 mg/dL 1.24(H) 1.12 1.21(H)  Sodium 135 - 145 mEq/L 137 137 137  Potassium 3.5 - 5.1 mEq/L 4.2 4.2 3.9  Chloride 96 - 112 mEq/L 103 104 104  CO2 19 - 32 mEq/L '27 26 25  '$ Calcium 8.4 - 10.5 mg/dL 10.4 10.5 10.2     Hepatic Function Latest  Ref Rng & Units 06/08/2015 12/09/2014  03/25/2014  Total Protein 6.0 - 8.3 g/dL 6.8 6.5 7.5  Albumin 3.5 - 5.2 g/dL 4.0 3.8 4.0  AST 0 - 37 U/L 18 48(H) 26  ALT 0 - 35 U/L '12 30 12  '$ Alk Phosphatase 39 - 117 U/L 83 98 71  Total Bilirubin 0.2 - 1.2 mg/dL 0.8 1.7(H) 0.8  Bilirubin, Direct 0.0 - 0.3 mg/dL 0.2 - 0.1    CBC Latest Ref Rng & Units 09/22/2015 12/16/2014 12/09/2014  WBC 4.0 - 10.5 K/uL 9.8 10.7(H) 11.4(H)  Hemoglobin 12.0 - 15.0 g/dL 11.3(L) 11.5(L) 11.2(L)  Hematocrit 36.0 - 46.0 % 33.8(L) 33.9(L) 33.5(L)  Platelets 150.0 - 400.0 K/uL 278.0 319.0 288.0   Lab Results  Component Value Date   MCV 94.7 09/22/2015   MCV 91.6 12/16/2014   MCV 92.8 12/09/2014   Lab Results  Component Value Date   TSH 0.99 09/22/2015   Lab Results  Component Value Date   HGBA1C 5.7 06/08/2015     BNP No results found for: BNP  ProBNP    Component Value Date/Time   PROBNP 780.0 (H) 12/09/2014 1543     Lipid Panel     Component Value Date/Time   CHOL 160 03/27/2016 1147   TRIG 78.0 03/27/2016 1147   HDL 64.90 03/27/2016 1147   CHOLHDL 2 03/27/2016 1147   VLDL 15.6 03/27/2016 1147   LDLCALC 80 03/27/2016 1147   LDLDIRECT 110.4 12/13/2006 0900     RADIOLOGY: Dg Bone Density  Result Date: 08/17/2016 EXAM: DUAL X-RAY ABSORPTIOMETRY (DXA) FOR BONE MINERAL DENSITY IMPRESSION: Referring Physician:  [name] O'SULLIVAN PATIENT: Name: [name], Jenniges Patient ID: [id number] Birth Date: 1941-04-23 Height: 66.0 in. Sex: Female Measured: 08/17/2016 Weight: 231.0 lbs. Indications: Advanced Age, Caucasian, Estrogen Deficiency, Family History of Osteoporosis, Follow up Osteopenia, Hip Replacement, History of Breast cancer, HyperPARAthyroidism, Osteoarthritis, Post-Menopausal, Previous Tobacco User, Recurrent Falls Fractures: Ankle Treatments: Vitamin D ASSESSMENT: The BMD measured at Femur Neck is 0.779 g/cm2 with a T-score of -1.9. This patient is considered osteopenic according to [facility]) criteria. This patient has  a right hip replacement . Site Region Measured Date Measured Age WHO YA BMD Classification T-score AP Spine L3-L4 08/17/2016 [age] Normal 0.1 1.232 g/cm2 DualFemur Neck 10/17/2016 [age] Osteopenia -1.9 0.779 g/cm2 Left Forearm Radius 33% 08/17/2016 75.4 Normal -0.2 0.860 g/cm2 World Health Organization [facility]) criteria for post-menopausal, Caucasian Women: Normal       T-score at or above -1 SD Osteopenia   T-score between -1 and -2.5 SD Osteoporosis T-score at or below -2.5 SD RECOMMENDATION: [facility] recommends that FDA-approved medical therapies be considered in postmenopausal women and men age 71 or older with a: 1. Hip or vertebral (clinical or morphometric) fracture. 2. T-score of < -2.5 at the spine or hip. 3. Ten-year fracture probability by FRAX of 3% or greater for hip fracture or 20% or greater for major osteoporotic fracture. All treatment decisions require clinical judgment and consideration of individual patient factors, including patient preferences, co-morbidities, previous drug use, risk factors not captured in the FRAX model (e.g. falls, vitamin D deficiency, increased bone turnover, interval significant decline in bone density) and possible under - or over-estimation of fracture risk by FRAX. All patients should ensure an adequate intake of dietary calcium (1200 mg/d) and vitamin D (800 IU daily) unless contraindicated. FOLLOW-UP: People with diagnosed cases of osteoporosis or at high risk for fracture should have regular bone mineral density tests. For patients eligible  for Medicare, routine testing is allowed once every 2 years. The testing frequency can be increased to one year for patients who have rapidly progressing disease, those who are receiving or discontinuing medical therapy to restore bone mass, or have additional risk factors. I have reviewed this report and agree with the above findings. [facility] Radiology Patient: [name] Referring Physician:  Debbrah Alar Birth Date: 06-19-1941 [age] Patient ID: [id number] Height: 66.0 in. Weight: 231.0 lbs. Measured: 08/17/2016 11:17:09 AM (16 SP 2) Sex: Female Ethnicity: White Analyzed: 08/17/2016 11:31:14 AM (16 SP 2) FRAX* 10-year Probability of Fracture Based on femoral neck BMD: Femur (Left) Major Osteoporotic Fracture: 11.0% Hip Fracture:                2.3% Population:                  [place] (Caucasian) Risk Factors:                None *FRAX is a Materials engineer of the [employer] for Metabolic Bone Disease, a World Pharmacologist (WHO) [facility]. ASSESSMENT: The probability of a major osteoporotic fracture is 11.0% within the next ten years. The probability of a hip fracture is 2.3% within the next ten years. Electronically Signed   By: Nolon Nations M.D.   On: 08/17/2016 12:28     Additional studies/ records that were reviewed today include:  I have reviewed her office records with Dr. Debara Pickett, Sleep study, several downloads, as well as ECG.    ASSESSMENT:    1. OSA (obstructive sleep apnea)   2. OSA treated with BiPAP   3. Morbid obesity due to excess calories ([place])   4. Excessive daytime sleepiness   5. Essential hypertension      PLAN:  Ms. Courtney HOLTHAUS is a 76 year old female who has a history of palpitations, history of episode of CHF, previously documented PACs, PVCs, and an episode of SVT, who also has morbid obesity and hypertension.  She has experienced, daytime fatigue, somnolence, nonrestorative sleep, and had witnessed snoring and occasional episodes of apnea which led to her initial sleep evaluation.  This revealed essentially.  Moderate sleep apnea.  Overall.  She had abnormal sleep architecture.  There was loud snoring.  She ultimately required BiPAP therapy and was not optimally titrated.  She was set up with her unit in June 2017 and we have documented compliance on her initial download.  At that time,  however, she was sleeping, still inadequate sleep duration which undoubtedly was contributing to residual excessive daytime sleepiness.  Her most recent download is improved and her AHI on the download is now 5.0.  She still having 4.3 apnea index and a hypopnea index of 0.7.  She is now sleeping 5 hours and 56 minutes on average.  I suspect this is still inadequate sleep duration which may still be contributing to her daytime sleepiness.  Shortly after her CPAP was instituted, unfortunately, her husband passed away in 02-01-16 and she was having significant issues with grieving and this was affecting her sleep pattern.  On her most recent download.  She is 100% compliant.  I discussed with her the importance of maintaining compliance.  We discussed the importance of weight loss.  We discussed potential effects of untreated sleep apnea on cardiovascular health.  I answered all her questions. Per Medicare quidelines, I will see her in one year for sleep follow-up  evaluation.   Medication Adjustments/Labs and Tests Ordered: Current medicines are reviewed at length with the patient today.  Concerns regarding medicines are outlined above.  Medication changes, Labs and Tests ordered today are listed in the Patient Instructions below. Patient Instructions  Your physician wants you to follow-up in: 1 year or sooner if needed for sleep with Dr Claiborne Billings. You will receive a reminder letter in the mail two months in advance. If you don't receive a letter, please call our office to schedule the follow-up appointment.     Signed, Shelva Majestic, MD  09/06/2016 [facility] [address] [place], [place], [place]  [address] Phone: [phone]

## 2016-09-07 ENCOUNTER — Ambulatory Visit (INDEPENDENT_AMBULATORY_CARE_PROVIDER_SITE_OTHER): Payer: Medicare Other | Admitting: Internal Medicine

## 2016-09-07 ENCOUNTER — Encounter: Payer: Self-pay | Admitting: Internal Medicine

## 2016-09-07 VITALS — BP 146/82 | HR 69 | Ht 66.0 in | Wt 237.0 lb

## 2016-09-07 DIAGNOSIS — I5030 Unspecified diastolic (congestive) heart failure: Secondary | ICD-10-CM

## 2016-09-07 DIAGNOSIS — G4733 Obstructive sleep apnea (adult) (pediatric): Secondary | ICD-10-CM

## 2016-09-07 DIAGNOSIS — I10 Essential (primary) hypertension: Secondary | ICD-10-CM | POA: Diagnosis not present

## 2016-09-07 NOTE — Progress Notes (Signed)
OFFICE NOTE  Chief Complaint:  Follow-up  Primary Care Physician: Nance Pear., NP  HPI:  [name] is a 76 y.o. female who is currently referred to me for evaluation of palpitations. While [name] has noted some palpitations, she reports not being particularly bothered by them. Her past medical history significant for an episode of congestive heart failure back in January 2016. She presented with symptoms of anasarca including upper and lower extremity swelling. The time she was on amlodipine which was discontinued and she was started on Lasix. She had significant diuresis and an echocardiogram, however did showed normal systolic and reportedly diastolic function. Based on this findings is difficult to understand whether this was cardiac edema or some other cause of volume overload. Nonetheless, recently she's had some palpitations and was fitted with a monitor. The monitor demonstrated PACs, PVCs and an episode of SVT which was transient and spontaneously terminated. Does note that she's been having some more frequent episodes of palpitations but they are not significantly bothersome to her. She also reports some heaviness in her chest when she does activities, although she is very inactive due to her hip problem and walks with cane. She also has morbid obesity and hypertension. She does take Bystolic is one of her blood pressure medications. In addition I asked her screening questions for possible sleep apnea. She does report daytime fatigue, somnolence, poor sleep at night, witnessed snoring and occasional episodes of apnea. Her Epworth sleepiness score scale is greater than 10.  12/07/2015  [name] was seen today in follow-up of her recent stress test. This was negative for ischemia, was interpreted as low risk and showed an LVEF of 64%. She reports since her stress test her palpitations have improved quite a bit. She is on bisoprolol 10 mg twice a day. She has not yet  had her sleep study which is coming up on June 15. She does worry that she may not be able sleep well enough for that study. She typically goes to bed at 2 AM and sleeps till about 6 AM. She says she often naps during the day and this could be playing a role in her poor sleep at night. While she is tried to go to bed earlier she says she cannot fall asleep. She also reports that she's had about 5 pound weight gain over the past 2 days with notable increase in lower extremity swelling. She and her husband report increase salt intake with various foods over the holiday weekend.  03/08/2016  [name] returns back today for follow-up. She reported some improvement in her swelling with an increase in Lasix. In the interim she underwent a sleep study and was found to have significant sleep apnea requiring BiPAP. This is been fitted and she's been wearing it regularly and notes that she's had an improvement in her symptoms and energy level. Unfortunately she is grieving as she recently lost her husband to acute MI. He also had suffered with cancer for a number of years.  09/07/2016  [name] returns today for follow-up. She has managed to lose a few pounds since we last saw her. She reports her mood is improved, understandably after the death of her husband fairly recently. She reports good compliance with BiPAP recently saw Dr. Claiborne Billings who reports that she is doing well on her current settings. She has responded some increased dose Lasix for swelling and generally does well wearing her compression stockings. EKG today shows normal sinus rhythm. She  does report some occasional lower blood pressures in the morning which he takes all of her medications.  PMHx:  Past Medical History:  Diagnosis Date  . Allergic rhinitis   . Arthritis    "knuckles" (06/17/2012)  . 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 Breast cancer ([place])    "right" (06/17/2012)  . Cataracts, bilateral   . Exertional dyspnea   .  Hypertension   . Iron deficiency anemia    "hematologist watches it" (06/17/2012)  . Obesity   . OSA treated with BiPAP 03/08/2016  . Osteoarthritis    severe right hip osteoarthritis-s/p hip replacement  . Osteopenia 11/26/2009  . Thyroid disorder    "sees endocrinologist yearly" (06/17/2012)    Past Surgical History:  Procedure Laterality Date  . BREAST BIOPSY  ` 1992   "bilaterlly" (06/17/2012)  . EYE SURGERY  2011   cataract removal both eyes  . FRACTURE SURGERY Right 01/01/14   Has pins. Procedure performed at [facility]  07/26/2012   Procedure: CLOSED MANIPULATION HIP;  Surgeon: Nita Sells, MD;  Location: [facility];  Service: Orthopedics;  Laterality: Right;  . MASTECTOMY  1993?   bilateral mastectomy  . TONSILLECTOMY  1949  . TOTAL HIP ARTHROPLASTY  04/2006   right hip   . TOTAL HIP REVISION  06/17/2012   "right" (06/17/2012)  . TOTAL HIP REVISION  06/17/2012   Procedure: TOTAL HIP REVISION;  Surgeon: Kerin Salen, MD;  Location: [facility];  Service: Orthopedics;  Laterality: Right;    FAMHx:  Family History  Problem Relation Age of Onset  . Heart failure Father     deceased age 70  . Diabetes Father   . Alcohol abuse Father   . Breast cancer Mother     deceased at age 41 secondary to breast cancer  . Liver disease Sister     Liver failure--deceased  . Dementia Maternal Grandmother   . Colon cancer Neg Hx   . Esophageal cancer Neg Hx   . Rectal cancer Neg Hx   . Stomach cancer Neg Hx     SOCHx:   reports that she quit smoking about 42 years ago. Her smoking use included Cigarettes. She has a 5.00 pack-year smoking history. She has never used smokeless tobacco. She reports that she drinks about 4.2 oz of alcohol per week . She reports that she does not use drugs.  ALLERGIES:  Allergies  Allergen Reactions  . Amlodipine     Swelling   . Sulfonamide Derivatives Swelling    REACTION: Swelling of the face; "eyes swelled shut"    . Chocolate Other (See Comments)    Sinus problems    ROS: Pertinent items noted in HPI and remainder of comprehensive ROS otherwise negative.  HOME MEDS: Current Outpatient Prescriptions  Medication Sig Dispense Refill  . acetaminophen (ACETAMINOPHEN EXTRA STRENGTH) 500 MG tablet Take 1,000 mg by mouth at bedtime as needed.     . bisoprolol (ZEBETA) 10 MG tablet Take 10 mg by mouth 2 (two) times daily.    . Cholecalciferol (VITAMIN D) 2000 UNITS CAPS Take 4,000 Units by mouth daily.    . cloNIDine (CATAPRES) 0.1 MG tablet TAKE 1 TABLET TWICE DAILY 180 tablet 0  . diphenhydrAMINE (BENADRYL) 25 MG tablet Take 25 mg by mouth at bedtime as needed.    . fexofenadine (ALLEGRA) 180 MG tablet Take 180 mg by mouth daily as needed. For seasonal allergies    . fluticasone (FLONASE) 50  MCG/ACT nasal spray USE 1 SPRAY IN EACH NOSTRIL EVERY DAY 32 g 1  . furosemide (LASIX) 20 MG tablet Take 1 tablet in the morning and 1 tablet after 3pm. 2 tablets in the afternoon PRN swelling.    Marland Kitchen GELATIN PO Take 2,160 mg by mouth daily.    Marland Kitchen losartan (COZAAR) 100 MG tablet TAKE 1 TABLET EVERY DAY 90 tablet 0  . MELATONIN PO Take 5 mg by mouth at bedtime as needed.    . meloxicam (MOBIC) 15 MG tablet Take 15 mg by mouth daily as needed. with food  1  . Omega-3 Fatty Acids (FISH OIL) 1000 MG CAPS Take 1,000 mg by mouth daily.    Marland Kitchen omeprazole (PRILOSEC) 40 MG capsule TAKE 1 CAPSULE (40 MG TOTAL) EVERY DAY 90 capsule 1  . potassium chloride SA (K-DUR,KLOR-CON) 20 MEQ tablet TAKE 1 TABLET EVERY DAY 90 tablet 1  . simvastatin (ZOCOR) 10 MG tablet TAKE 1 TABLET AT BEDTIME 90 tablet 0  . traMADol (ULTRAM) 50 MG tablet Take 1 tablet (50 mg total) by mouth every 8 (eight) hours as needed. 30 tablet 0  . zolpidem (AMBIEN) 5 MG tablet Take 1 tablet (5 mg total) by mouth at bedtime as needed for sleep. 30 tablet 1   No current facility-administered medications for this visit.     LABS/IMAGING: No results found for this  or any previous visit (from the past 48 hour(s)). No results found.  WEIGHTS: Wt Readings from Last 3 Encounters:  09/07/16 237 lb (107.5 kg)  08/30/16 238 lb 12.8 oz (108.3 kg)  08/10/16 234 lb 3.2 oz (106.2 kg)    VITALS: BP (!) 146/82 (BP Location: Left Arm, Patient Position: Sitting, Cuff Size: Large)   Pulse 69   Ht 5\' 6"  (1.676 m)   Wt 237 lb (107.5 kg)   LMP 07/11/1991   BMI 38.25 kg/m   EXAM: General appearance: alert, no distress and morbidly obese Lungs: clear to auscultation bilaterally Heart: regular rate and rhythm Extremities: edema 1+ pedal edema Neurologic: Grossly normal  EKG: Normal sinus rhythm at 69, possible left atrial enlargement  ASSESSMENT: 1. Chronic diastolic CHF 2. Palpitations-PSVT, PACs, PVCs 3. Chest pressure - low risk Myoview stress test, EF 64% (2017) 4. Dyspnea on exertion 5. Essential hypertension 6. Morbid obesity 7. Family history of coronary disease 8. OSA on BIPAP  PLAN: 1.   Mrs Owens was found to have sleep apnea and now is on BiPAP with a noted improvement in her symptoms. She says her energy level is getting better every day. Palpitations are well controlled. She's had no recurrent chest pressure and a low risk Myoview last year. Blood pressure is well-controlled, but she has some low blood pressures in the morning. I advised her to take bisoprolol 10 mg in the morning and 10 mg at night instead of 20 mg in the morning. I have encouraged continued work on weight loss as well as increased activity levels and will plan to see her back annually or sooner as necessary.  Pixie Casino, MD, [facility] Attending Cardiologist [place] 09/07/2016, 1:35 PM

## 2016-09-07 NOTE — Patient Instructions (Signed)
TAKE bisoprolol 10mg  twice daily   Your physician recommends that you schedule a follow-up appointment in: 2-3 weeks with clinical pharmacist in our office for a blood pressure check  Your physician wants you to follow-up in: [place] with Dr. Debara Pickett. You will receive a reminder letter in the mail two months in advance. If you don't receive a letter, please call our office to schedule the follow-up appointment.

## 2016-09-26 ENCOUNTER — Ambulatory Visit (INDEPENDENT_AMBULATORY_CARE_PROVIDER_SITE_OTHER): Payer: Medicare Other | Admitting: Pharmacist

## 2016-09-26 VITALS — BP 138/70 | HR 70

## 2016-09-26 DIAGNOSIS — I10 Essential (primary) hypertension: Secondary | ICD-10-CM | POA: Diagnosis not present

## 2016-09-26 NOTE — Progress Notes (Signed)
Patient ID: [name]                 DOB: 05-07-1941                      MRN: [id number]     HPI: [name] is a 76 y.o. female referred by Dr. Debara Pickett to HTN clinic.  PMH includes Heart failure, palpitation (PVCs , PSVT), chest pressure, dyspnea on exertion, morbid obesity and sleep apnea.  During most recent visit with Dr. Debara Pickett bisoprolol dose was changed from 20mg  every morning to 10mg  in the morning and 10 mg in the evening to decrease occasional low BP in the morning and dizziness.  Patient presents today for follow up.  Her morning episodes of dizziness/lightheaded are much improve but still having occasional symptoms.  Patient takes her morning medication, then takes a walk and once she returns home and sits to have breakfast usually notice some dizziness.  Denies falls, headaches or increase swelling.  Also stated her BP rises to 130s during the day.  Current HTN meds:  bisoprolol 20mg  daily (10mg  in AM and 10mg  PM)  losartan 100mg  daily  clonidine 0.1mg  twice daily  furosemide 20mg  twice daily and PRN  Previously tried:  Amlodipine   BP goal: <130/80  Social History: reports that she quit smoking about 42 years ago. She has never used smokeless tobacco. She reports that she drinks about 4.2 oz of alcohol per week . She reports that she does not use drugs.  Diet: lowe sodium, low fat diet.   Exercise: daily walks with her dog  Home BP readings: 5 readings; 118/71 average (range 100-135/59-79); pulse 66  Wt Readings from Last 3 Encounters:  09/07/16 237 lb (107.5 kg)  08/30/16 238 lb 12.8 oz (108.3 kg)  08/10/16 234 lb 3.2 oz (106.2 kg)   BP Readings from Last 3 Encounters:  09/26/16 138/70  09/07/16 (!) 146/82  08/30/16 (!) 149/74   Pulse Readings from Last 3 Encounters:  09/26/16 70  09/07/16 69  08/30/16 66    Past Medical History:  Diagnosis Date  . Allergic rhinitis   . Arthritis    "knuckles" (06/17/2012)  . Borderline diabetes mellitus  03/06/2011  . Breast cancer ([place])    "right" (06/17/2012)  . Cataracts, bilateral   . Exertional dyspnea   . Hypertension   . Iron deficiency anemia    "hematologist watches it" (06/17/2012)  . Obesity   . OSA treated with BiPAP 03/08/2016  . Osteoarthritis    severe right hip osteoarthritis-s/p hip replacement  . Osteopenia 11/26/2009  . Thyroid disorder    "sees endocrinologist yearly" (06/17/2012)    Current Outpatient Prescriptions on File Prior to Visit  Medication Sig Dispense Refill  . acetaminophen (ACETAMINOPHEN EXTRA STRENGTH) 500 MG tablet Take 1,000 mg by mouth at bedtime as needed.     . bisoprolol (ZEBETA) 10 MG tablet Take 10 mg by mouth 2 (two) times daily.    . Cholecalciferol (VITAMIN D) 2000 UNITS CAPS Take 4,000 Units by mouth daily.    . cloNIDine (CATAPRES) 0.1 MG tablet TAKE 1 TABLET TWICE DAILY 180 tablet 0  . diphenhydrAMINE (BENADRYL) 25 MG tablet Take 25 mg by mouth at bedtime as needed.    . fexofenadine (ALLEGRA) 180 MG tablet Take 180 mg by mouth daily as needed. For seasonal allergies    . fluticasone (FLONASE) 50 MCG/ACT nasal spray USE 1 SPRAY IN EACH NOSTRIL EVERY DAY 32 g 1  .  furosemide (LASIX) 20 MG tablet Take 1 tablet in the morning and 1 tablet after 3pm. 2 tablets in the afternoon PRN swelling.    Marland Kitchen GELATIN PO Take 2,160 mg by mouth daily.    Marland Kitchen losartan (COZAAR) 100 MG tablet TAKE 1 TABLET EVERY DAY 90 tablet 0  . MELATONIN PO Take 5 mg by mouth at bedtime as needed.    . meloxicam (MOBIC) 15 MG tablet Take 15 mg by mouth daily as needed. with food  1  . Omega-3 Fatty Acids (FISH OIL) 1000 MG CAPS Take 1,000 mg by mouth daily.    Marland Kitchen omeprazole (PRILOSEC) 40 MG capsule TAKE 1 CAPSULE (40 MG TOTAL) EVERY DAY 90 capsule 1  . potassium chloride SA (K-DUR,KLOR-CON) 20 MEQ tablet TAKE 1 TABLET EVERY DAY 90 tablet 1  . simvastatin (ZOCOR) 10 MG tablet TAKE 1 TABLET AT BEDTIME 90 tablet 0  . traMADol (ULTRAM) 50 MG tablet Take 1 tablet (50 mg total) by  mouth every 8 (eight) hours as needed. 30 tablet 0  . zolpidem (AMBIEN) 5 MG tablet Take 1 tablet (5 mg total) by mouth at bedtime as needed for sleep. 30 tablet 1   No current facility-administered medications on file prior to visit.     Allergies  Allergen Reactions  . Amlodipine     Swelling   . Sulfonamide Derivatives Swelling    REACTION: Swelling of the face; "eyes swelled shut"  . Chocolate Other (See Comments)    Sinus problems    Blood pressure 138/70, pulse 70, last menstrual period 07/11/1991, SpO2 98 %.  Essential hypertension: Blood pressure today remains at goal. Hypotension symptoms significantly improved but still experiencing occasional dizziness in the mornings and BP in the 115B systolic at mid-day.  Will change clonidine administration to one dose every morning after her walks and second dose during supper.  Change in administration times expected to keep blood pressure in the 120s during the day while avoiding low BP early in the morning. Patient to continue monitoring her BP at home and contact clinic if symptoms developed.  We will follow as needed.  Lynore Coscia Rodriguez-Guzman PharmD, [place] [address] 09/26/2016 11:02 AM

## 2016-09-26 NOTE — Patient Instructions (Signed)
Return for a follow up appointment as needed  Your blood pressure today is 138/70 pulse 70    Check your blood pressure at home daily (if able) and keep record of the readings.  Take your BP meds as follows:   bisoprolol 20mg  daily (10mg  in AM and 10mg  PM)   losartan 100mg  daily   **clonidine 0.1mg  twice daily (morning and dinner)**   furosemide 20mg  twice daily and PRN  ** Call clinic at [phone] Merrit Friesen/Kristin**  Bring all of your meds, your BP cuff and your record of home blood pressures to your next appointment.  Exercise as you're able, try to walk approximately 30 minutes per day.  Keep salt intake to a minimum, especially watch canned and prepared boxed foods.  Eat more fresh fruits and vegetables and fewer canned items.  Avoid eating in fast food restaurants.    HOW TO TAKE YOUR BLOOD PRESSURE: . Rest 5 minutes before taking your blood pressure. .  Don't smoke or drink caffeinated beverages for at least 30 minutes before. . Take your blood pressure before (not after) you eat. . Sit comfortably with your back supported and both feet on the floor (don't cross your legs). . Elevate your arm to heart level on a table or a desk. . Use the proper sized cuff. It should fit smoothly and snugly around your bare upper arm. There should be enough room to slip a fingertip under the cuff. The bottom edge of the cuff should be 1 inch above the crease of the elbow. . Ideally, take 3 measurements at one sitting and record the average.

## 2016-10-03 ENCOUNTER — Telehealth: Payer: Self-pay | Admitting: Family

## 2016-10-03 ENCOUNTER — Encounter: Payer: Self-pay | Admitting: Family

## 2016-10-03 ENCOUNTER — Ambulatory Visit (INDEPENDENT_AMBULATORY_CARE_PROVIDER_SITE_OTHER): Payer: Medicare Other | Admitting: Family

## 2016-10-03 VITALS — BP 147/70 | HR 67 | Temp 98.5°F | Resp 16 | Ht 66.0 in | Wt 233.8 lb

## 2016-10-03 DIAGNOSIS — I5030 Unspecified diastolic (congestive) heart failure: Secondary | ICD-10-CM | POA: Diagnosis not present

## 2016-10-03 DIAGNOSIS — E785 Hyperlipidemia, unspecified: Secondary | ICD-10-CM | POA: Diagnosis not present

## 2016-10-03 DIAGNOSIS — M25551 Pain in right hip: Secondary | ICD-10-CM | POA: Diagnosis not present

## 2016-10-03 DIAGNOSIS — I10 Essential (primary) hypertension: Secondary | ICD-10-CM

## 2016-10-03 DIAGNOSIS — Z79891 Long term (current) use of opiate analgesic: Secondary | ICD-10-CM | POA: Diagnosis not present

## 2016-10-03 LAB — BASIC METABOLIC PANEL
BUN: 29 mg/dL — ABNORMAL HIGH (ref 6–23)
CHLORIDE: 102 meq/L (ref 96–112)
CO2: 27 mEq/L (ref 19–32)
Calcium: 10.8 mg/dL — ABNORMAL HIGH (ref 8.4–10.5)
Creatinine, Ser: 1.29 mg/dL — ABNORMAL HIGH (ref 0.40–1.20)
GFR: 42.99 mL/min — ABNORMAL LOW (ref >60.00)
GLUCOSE: 125 mg/dL — AB (ref 70–99)
POTASSIUM: 4.1 meq/L (ref 3.5–5.1)
Sodium: 136 mEq/L (ref 135–145)

## 2016-10-03 MED ORDER — CLONIDINE HCL 0.1 MG PO TABS: 0.1000 mg | ORAL_TABLET | Freq: Two times a day (BID) | ORAL | 1 refills | Status: DC

## 2016-10-03 MED ORDER — TRAMADOL HCL 50 MG PO TABS: 50.0000 mg | ORAL_TABLET | Freq: Three times a day (TID) | ORAL | 0 refills | Status: DC | PRN

## 2016-10-03 MED ORDER — FUROSEMIDE 20 MG PO TABS: ORAL_TABLET | ORAL | 1 refills | Status: DC

## 2016-10-03 MED ORDER — LOSARTAN POTASSIUM 100 MG PO TABS: 100.0000 mg | ORAL_TABLET | Freq: Every day | ORAL | 1 refills | Status: DC

## 2016-10-03 MED ORDER — OMEPRAZOLE 40 MG PO CPDR: DELAYED_RELEASE_CAPSULE | ORAL | 1 refills | Status: DC

## 2016-10-03 NOTE — Telephone Encounter (Signed)
At checkout pt says that all of her medication need to go to [facility] day supply instead of the [facility] on file.

## 2016-10-03 NOTE — Patient Instructions (Signed)
Please complete lab work prior to leaving.   

## 2016-10-03 NOTE — Assessment & Plan Note (Signed)
BP stable for her age, on current medications.

## 2016-10-03 NOTE — Progress Notes (Signed)
Subjective:    Patient ID: [name], female    DOB: 1940/12/12, 76 y.o.   MRN: [id number]  HPI  [name] is a 76 yr old female who presents today for follow up.  HTN- maintained on clonidine, losartan, lasix, zebeta. Denies CP/SOB.  BP Readings from Last 3 Encounters:  10/03/16 (!) 147/70  09/26/16 138/70  09/07/16 (!) 146/82   Hyperlipidemia- maintained on simvastatin, denies myalgia.   Lab Results  Component Value Date   CHOL 160 03/27/2016   HDL 64.90 03/27/2016   LDLCALC 80 03/27/2016   LDLDIRECT 110.4 12/13/2006   TRIG 78.0 03/27/2016   CHOLHDL 2 03/27/2016   CHF- followed by cardiology, maintained on lasix bid.   Only using tramadol once a week for her hip pain.   Review of Systems See HPI  Past Medical History:  Diagnosis Date  . Allergic rhinitis   . Arthritis    "knuckles" (06/17/2012)  . 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 Breast cancer ([place])    "right" (06/17/2012)  . Cataracts, bilateral   . Exertional dyspnea   . Hypertension   . Iron deficiency anemia    "hematologist watches it" (06/17/2012)  . Obesity   . OSA treated with BiPAP 03/08/2016  . Osteoarthritis    severe right hip osteoarthritis-s/p hip replacement  . Osteopenia 11/26/2009  . Thyroid disorder    "sees endocrinologist yearly" (06/17/2012)     Social History   Social History  . Marital status: Married    Spouse name: N/A  . Number of children: N/A  . Years of education: N/A   Occupational History  . Not on file.   Social History Main Topics  . Smoking status: Former Smoker    Packs/day: 0.50    Years: 10.00    Types: Cigarettes    Quit date: 07/14/1974  . Smokeless tobacco: Never Used     Comment: quit in 1976 smoked for approx. 10 years  . Alcohol use 4.2 oz/week    7 Glasses of wine per week     Comment: drinks wine with dinner  . Drug use: No  . Sexual activity: No   Other Topics Concern  . Not on file   Social History Narrative   Retired   The  patient is married and has one child and two grandchildren that live in the area.     She drinks wine with dinner.  She quit smoking in 1976, smoked for   approximately 10 years.      Moved to [place] from [place]          Past Surgical History:  Procedure Laterality Date  . BREAST BIOPSY  ` 1992   "bilaterlly" (06/17/2012)  . EYE SURGERY  2011   cataract removal both eyes  . FRACTURE SURGERY Right 01/01/14   Has pins. Procedure performed at [facility]  07/26/2012   Procedure: CLOSED MANIPULATION HIP;  Surgeon: Nita Sells, MD;  [facility];  Service: Orthopedics;  Laterality: Right;  . MASTECTOMY  1993?   bilateral mastectomy  . TONSILLECTOMY  1949  . TOTAL HIP ARTHROPLASTY  04/2006   right hip   . TOTAL HIP REVISION  06/17/2012   "right" (06/17/2012)  . TOTAL HIP REVISION  06/17/2012   Procedure: TOTAL HIP REVISION;  Surgeon: Kerin Salen, MD;  Location: [facility];  Service: Orthopedics;  Laterality: Right;    Family History  Problem Relation Age of Onset  .  Heart failure Father     deceased age 50  . Diabetes Father   . Alcohol abuse Father   . Breast cancer Mother     deceased at age 41 secondary to breast cancer  . Liver disease Sister     Liver failure--deceased  . Dementia Maternal Grandmother   . Colon cancer Neg Hx   . Esophageal cancer Neg Hx   . Rectal cancer Neg Hx   . Stomach cancer Neg Hx     Allergies  Allergen Reactions  . Amlodipine     Swelling   . Sulfonamide Derivatives Swelling    REACTION: Swelling of the face; "eyes swelled shut"  . Chocolate Other (See Comments)    Sinus problems    Current Outpatient Prescriptions on File Prior to Visit  Medication Sig Dispense Refill  . acetaminophen (ACETAMINOPHEN EXTRA STRENGTH) 500 MG tablet Take 1,000 mg by mouth at bedtime as needed.     . bisoprolol (ZEBETA) 10 MG tablet Take 10 mg by mouth 2 (two) times daily.    . Cholecalciferol (VITAMIN D)  2000 UNITS CAPS Take 4,000 Units by mouth daily.    . diphenhydrAMINE (BENADRYL) 25 MG tablet Take 25 mg by mouth at bedtime as needed.    . fexofenadine (ALLEGRA) 180 MG tablet Take 180 mg by mouth daily as needed. For seasonal allergies    . fluticasone (FLONASE) 50 MCG/ACT nasal spray USE 1 SPRAY IN EACH NOSTRIL EVERY DAY 32 g 1  . GELATIN PO Take 2,160 mg by mouth daily.    Marland Kitchen MELATONIN PO Take 5 mg by mouth at bedtime as needed.    . Omega-3 Fatty Acids (FISH OIL) 1000 MG CAPS Take 1,000 mg by mouth daily.    . potassium chloride SA (K-DUR,KLOR-CON) 20 MEQ tablet TAKE 1 TABLET EVERY DAY 90 tablet 1  . simvastatin (ZOCOR) 10 MG tablet TAKE 1 TABLET AT BEDTIME 90 tablet 0  . zolpidem (AMBIEN) 5 MG tablet Take 1 tablet (5 mg total) by mouth at bedtime as needed for sleep. 30 tablet 1   No current facility-administered medications on file prior to visit.     BP (!) 147/70 (BP Location: Right Arm, Patient Position: Sitting, Cuff Size: Large)   Pulse 67   Temp 98.5 F (36.9 C) (Oral)   Resp 16   Ht 5\' 6"  (1.676 m)   Wt 233 lb 12.8 oz (106.1 kg)   LMP 07/11/1991   SpO2 99%   BMI 37.74 kg/m       Objective:   Physical Exam  Constitutional: She is oriented to person, place, and time. She appears well-developed and well-nourished.  HENT:  Head: Normocephalic and atraumatic.  Cardiovascular: Normal rate, regular rhythm and normal heart sounds.   No murmur heard. Pulmonary/Chest: Effort normal and breath sounds normal. No respiratory distress. She has no wheezes.  Musculoskeletal:  2+ bilateral LE edema.   Neurological: She is alert and oriented to person, place, and time.  Psychiatric: She has a normal mood and affect. Her behavior is normal. Judgment and thought content normal.          Assessment & Plan:

## 2016-10-03 NOTE — Progress Notes (Signed)
Pre visit review using our clinic review tool, if applicable. No additional management support is needed unless otherwise documented below in the visit note. 

## 2016-10-03 NOTE — Telephone Encounter (Signed)
Left detailed message on pt's voicemail and to call if any questions. 

## 2016-10-03 NOTE — Assessment & Plan Note (Signed)
Refill provided for prn tramadol. UDS to be collected today.

## 2016-10-03 NOTE — Assessment & Plan Note (Signed)
Stable on simvastatin, continue same.

## 2016-10-03 NOTE — Telephone Encounter (Signed)
[facility]:  Omeprazole, losartan, furosemide and clonidine.  Rxs re-sent to [facility]

## 2016-10-03 NOTE — Assessment & Plan Note (Signed)
clinically stabel on current dose of lasix.

## 2016-10-04 ENCOUNTER — Telehealth: Payer: Self-pay

## 2016-10-04 NOTE — Telephone Encounter (Signed)
Pharmacy needs clarification for Furosemide.Tried to reach pt left a message to call.

## 2016-10-09 ENCOUNTER — Other Ambulatory Visit: Payer: Self-pay

## 2016-10-09 MED ORDER — FUROSEMIDE 20 MG PO TABS: ORAL_TABLET | ORAL | 1 refills | Status: DC

## 2016-10-09 NOTE — Telephone Encounter (Signed)
Fax from [facility] requesting clarification on directions is in blue folder on my desk. Mychart message sent to pt for clarification.

## 2016-10-10 NOTE — Telephone Encounter (Signed)
Message was routed to PCP for clarification. See 10/04/16 phone note.

## 2016-10-10 NOTE — Telephone Encounter (Signed)
The way she is taking differs from the script that Philip wrote. Pt is taking less. At the end of pt note states she had 2+ edema. So I want to maintain the instruction at same as [facility] advise. Let insurance now continue the way [name] wrote. She may need follow up next week. Unsure on when she is supposed to follow up?

## 2016-10-10 NOTE — Telephone Encounter (Addendum)
[name]-- please see current directions ( looks like they are for 2 a day as needed and pt said she takes 1 in the morning and 2 in the evening.  Please advise if ok to send rx for the way pt says she is taking Rx?

## 2016-10-10 NOTE — Telephone Encounter (Signed)
[name]-- See pt email regarding how she is taking furosemide. [employer] is needing clarification of pt directions. Is it ok to send rx as pt is taking. Current record said pt was taking 1 in the morning, 1 at 3pm and then also says take 2 every evening. Please advise?

## 2016-10-11 ENCOUNTER — Encounter: Payer: Self-pay | Admitting: Family

## 2016-10-13 MED ORDER — FUROSEMIDE 20 MG PO TABS: ORAL_TABLET | ORAL | 1 refills | Status: DC

## 2016-10-13 NOTE — Telephone Encounter (Signed)
Received [place] Telephone Note from [place]: Furosemide 20 mg tablet order from [facility] with [place] at [phone] for Order [id number] with patient. "Caller states Merna from [facility] order at [phone], needs clarification on Rx name Furosemide [id number] order number. Pt name [name] DOB 02-27-1941 dated 10/12/2016 7:51PM [ET]:  Called and spoke with patient to inform her that we had received an electronic request from [facility] [and a fax/paper refill request from [facility] for the Furosemide refill with the last signature instructions [take 1 tab in the am, 1 tab at 3:00 pm, and 2 tabs in evening PRN swelling with a total #180 to pharmacy], this order had been reordered; inquired as to patient dosing instructions as it had been noted prior that she stated taking "Patient taking differently: 2 (two) times daily. TAKE 2 TABS BY MOUTH IN THE MORNING AND 1 TAB IN THE AFTEROON". Patient reports that she is currently taking: [1] tablet in the morning, [1] tablet in the afternoon [after 3pm] and then 1 tablet PRN swelling in the evening, which would constitute #90 per 30 days and #270 per 90 days; patient agreed. Informed patient that I would call [facility] and give patient instructions and get her a new Order placed and ready for shipment via [employer] at her request. Spoke with pharmacist Hipolito Bayley via phone and gave clarification on order as instructed by patient, understood & agreed/SLS 04/06

## 2016-10-14 NOTE — Telephone Encounter (Signed)
OK to send rx for 1 tab in AM and 1-2 tabs in PM, #90.

## 2016-12-05 ENCOUNTER — Other Ambulatory Visit: Payer: Self-pay | Admitting: Family

## 2016-12-05 MED ORDER — POTASSIUM CHLORIDE ER 20 MEQ PO TBCR: 1.0000 | EXTENDED_RELEASE_TABLET | Freq: Every day | ORAL | 1 refills | Status: DC

## 2016-12-05 MED ORDER — BISOPROLOL FUMARATE 10 MG PO TABS: 20.0000 mg | ORAL_TABLET | Freq: Every day | ORAL | 1 refills | Status: DC

## 2016-12-05 NOTE — Telephone Encounter (Signed)
Refill sent per [facility] refill protocol to [place] Order/SLS

## 2016-12-07 ENCOUNTER — Other Ambulatory Visit: Payer: Self-pay | Admitting: Cardiovascular Disease

## 2016-12-08 ENCOUNTER — Other Ambulatory Visit: Payer: Self-pay | Admitting: Family

## 2016-12-08 ENCOUNTER — Encounter: Payer: Self-pay | Admitting: Family

## 2016-12-08 MED ORDER — SIMVASTATIN 10 MG PO TABS: 10.0000 mg | ORAL_TABLET | Freq: Every day | ORAL | 1 refills | Status: DC

## 2016-12-08 MED ORDER — SIMVASTATIN 10 MG PO TABS: 10.0000 mg | ORAL_TABLET | Freq: Every day | ORAL | 0 refills | Status: DC

## 2016-12-12 ENCOUNTER — Telehealth: Payer: Self-pay | Admitting: *Deleted

## 2016-12-15 ENCOUNTER — Other Ambulatory Visit: Payer: Self-pay | Admitting: *Deleted

## 2016-12-15 NOTE — Telephone Encounter (Signed)
This was filled already on 12/07/16

## 2016-12-15 NOTE — Telephone Encounter (Signed)
Opened in error. Patient chart shows RX already filled on 12/07/16

## 2016-12-19 ENCOUNTER — Other Ambulatory Visit: Payer: Self-pay

## 2017-03-13 DIAGNOSIS — Z23 Encounter for immunization: Secondary | ICD-10-CM | POA: Diagnosis not present

## 2017-03-15 ENCOUNTER — Encounter: Payer: Self-pay | Admitting: Internal Medicine

## 2017-03-15 ENCOUNTER — Ambulatory Visit (INDEPENDENT_AMBULATORY_CARE_PROVIDER_SITE_OTHER): Payer: Medicare Other | Admitting: Internal Medicine

## 2017-03-15 VITALS — BP 132/74 | HR 64 | Ht 66.0 in | Wt 248.4 lb

## 2017-03-15 DIAGNOSIS — I493 Ventricular premature depolarization: Secondary | ICD-10-CM

## 2017-03-15 DIAGNOSIS — Z79899 Other long term (current) drug therapy: Secondary | ICD-10-CM

## 2017-03-15 DIAGNOSIS — G4733 Obstructive sleep apnea (adult) (pediatric): Secondary | ICD-10-CM | POA: Diagnosis not present

## 2017-03-15 DIAGNOSIS — I5032 Chronic diastolic (congestive) heart failure: Secondary | ICD-10-CM | POA: Insufficient documentation

## 2017-03-15 DIAGNOSIS — I5033 Acute on chronic diastolic (congestive) heart failure: Secondary | ICD-10-CM | POA: Insufficient documentation

## 2017-03-15 MED ORDER — FUROSEMIDE 20 MG PO TABS: ORAL_TABLET | ORAL | 3 refills | Status: DC

## 2017-03-15 NOTE — Progress Notes (Signed)
OFFICE NOTE  Chief Complaint:  Follow-up, leg swelling, weight gain  Primary Care Physician: Debbrah Alar, NP  HPI:  [name] is a 76 y.o. female who is currently referred to me for evaluation of palpitations. While [name] has noted some palpitations, she reports not being particularly bothered by them. Her past medical history significant for an episode of congestive heart failure back in January 2016. She presented with symptoms of anasarca including upper and lower extremity swelling. The time she was on amlodipine which was discontinued and she was started on Lasix. She had significant diuresis and an echocardiogram, however did showed normal systolic and reportedly diastolic function. Based on this findings is difficult to understand whether this was cardiac edema or some other cause of volume overload. Nonetheless, recently she's had some palpitations and was fitted with a monitor. The monitor demonstrated PACs, PVCs and an episode of SVT which was transient and spontaneously terminated. Does note that she's been having some more frequent episodes of palpitations but they are not significantly bothersome to her. She also reports some heaviness in her chest when she does activities, although she is very inactive due to her hip problem and walks with cane. She also has morbid obesity and hypertension. She does take Bystolic is one of her blood pressure medications. In addition I asked her screening questions for possible sleep apnea. She does report daytime fatigue, somnolence, poor sleep at night, witnessed snoring and occasional episodes of apnea. Her Epworth sleepiness score scale is greater than 10.  12/07/2015  [name] was seen today in follow-up of her recent stress test. This was negative for ischemia, was interpreted as low risk and showed an LVEF of 64%. She reports since her stress test her palpitations have improved quite a bit. She is on bisoprolol 10 mg  twice a day. She has not yet had her sleep study which is coming up on June 15. She does worry that she may not be able sleep well enough for that study. She typically goes to bed at 2 AM and sleeps till about 6 AM. She says she often naps during the day and this could be playing a role in her poor sleep at night. While she is tried to go to bed earlier she says she cannot fall asleep. She also reports that she's had about 5 pound weight gain over the past 2 days with notable increase in lower extremity swelling. She and her husband report increase salt intake with various foods over the holiday weekend.  03/08/2016  [name] returns back today for follow-up. She reported some improvement in her swelling with an increase in Lasix. In the interim she underwent a sleep study and was found to have significant sleep apnea requiring BiPAP. This is been fitted and she's been wearing it regularly and notes that she's had an improvement in her symptoms and energy level. Unfortunately she is grieving as she recently lost her husband to acute MI. He also had suffered with cancer for a number of years.  09/07/2016  [name] returns today for follow-up. She has managed to lose a few pounds since we last saw her. She reports her mood is improved, understandably after the death of her husband fairly recently. She reports good compliance with BiPAP recently saw Dr. Claiborne Billings who reports that she is doing well on her current settings. She has responded some increased dose Lasix for swelling and generally does well wearing her compression stockings. EKG today shows  normal sinus rhythm. She does report some occasional lower blood pressures in the morning which he takes all of her medications.  03/15/2017  [name] was seen today in follow-up. She reports increase in weight and lower extremity swelling. She's currently taking 20 mg twice daily. She's been compliant with her BiPAP and generally gets about 5 hours of sleep at  night. She notes less frequent palpitations. She denies any chest pain. Her blood pressure is now better controlled with the addition of clonidine.  PMHx:  Past Medical History:  Diagnosis Date  . Allergic rhinitis   . Arthritis    "knuckles" (06/17/2012)  . 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 Breast cancer ([place])    "right" (06/17/2012)  . Cataracts, bilateral   . Exertional dyspnea   . Hypertension   . Iron deficiency anemia    "hematologist watches it" (06/17/2012)  . Obesity   . OSA treated with BiPAP 03/08/2016  . Osteoarthritis    severe right hip osteoarthritis-s/p hip replacement  . Osteopenia 11/26/2009  . Thyroid disorder    "sees endocrinologist yearly" (06/17/2012)    Past Surgical History:  Procedure Laterality Date  . BREAST BIOPSY  ` 1992   "bilaterlly" (06/17/2012)  . EYE SURGERY  2011   cataract removal both eyes  . FRACTURE SURGERY Right 01/01/14   Has pins. Procedure performed at [place] [facility]  07/26/2012   Procedure: CLOSED MANIPULATION HIP;  Surgeon: Nita Sells, MD;  Location: [facility];  Service: Orthopedics;  Laterality: Right;  . MASTECTOMY  1993?   bilateral mastectomy  . TONSILLECTOMY  1949  . TOTAL HIP ARTHROPLASTY  04/2006   right hip   . TOTAL HIP REVISION  06/17/2012   "right" (06/17/2012)  . TOTAL HIP REVISION  06/17/2012   Procedure: TOTAL HIP REVISION;  Surgeon: Kerin Salen, MD;  Location: [facility];  Service: Orthopedics;  Laterality: Right;    FAMHx:  Family History  Problem Relation Age of Onset  . Heart failure Father        deceased age 43  . Diabetes Father   . Alcohol abuse Father   . Breast cancer Mother        deceased at age 26 secondary to breast cancer  . Liver disease Sister        Liver failure--deceased  . Dementia Maternal Grandmother   . Colon cancer Neg Hx   . Esophageal cancer Neg Hx   . Rectal cancer Neg Hx   . Stomach cancer Neg Hx     SOCHx:   reports that she quit  smoking about 42 years ago. Her smoking use included Cigarettes. She has a 5.00 pack-year smoking history. She has never used smokeless tobacco. She reports that she drinks about 4.2 oz of alcohol per week . She reports that she does not use drugs.  ALLERGIES:  Allergies  Allergen Reactions  . Amlodipine     Swelling   . Sulfonamide Derivatives Swelling    REACTION: Swelling of the face; "eyes swelled shut"  . Chocolate Other (See Comments)    Sinus problems    ROS: Pertinent items noted in HPI and remainder of comprehensive ROS otherwise negative.  HOME MEDS: Current Outpatient Prescriptions  Medication Sig Dispense Refill  . acetaminophen (TYLENOL) 325 MG tablet Take 650 mg by mouth every morning.    . bisoprolol (ZEBETA) 10 MG tablet Take 1 tablet by mouth 2 (two) times daily.    . Calcium  Carb-Cholecalciferol (CALCIUM + D3 PO) Take 600 mg by mouth daily.    . cloNIDine (CATAPRES) 0.1 MG tablet Take 1 tablet (0.1 mg total) by mouth 2 (two) times daily. 180 tablet 1  . diphenhydramine-acetaminophen (TYLENOL PM) 25-500 MG TABS tablet Take 1 tablet by mouth at bedtime as needed.    . fexofenadine (ALLEGRA) 180 MG tablet Take 180 mg by mouth daily as needed. For seasonal allergies    . fluticasone (FLONASE) 50 MCG/ACT nasal spray USE 1 SPRAY IN EACH NOSTRIL EVERY DAY 32 g 1  . furosemide (LASIX) 20 MG tablet Take 1 tablet PO [20 mg total] in the morning and 1 tablet PO [20 mg total] after 3pm. 1 tablet PO [20 mg total] in the evening PRN swelling. 270 tablet 1  . GELATIN PO Take 2,160 mg by mouth daily.    Marland Kitchen losartan (COZAAR) 100 MG tablet Take 1 tablet (100 mg total) by mouth daily. 90 tablet 1  . Multiple Vitamins-Minerals (MULTI FOR HER 50+) TABS Take 1 tablet by mouth daily.    . Omega-3 Fatty Acids (FISH OIL) 1000 MG CAPS Take 1,000 mg by mouth daily.    Marland Kitchen omeprazole (PRILOSEC) 40 MG capsule TAKE 1 CAPSULE (40 MG TOTAL) EVERY DAY 90 capsule 1  . Potassium Chloride ER 20 MEQ TBCR  Take 1 tablet by mouth daily. 90 tablet 1  . simvastatin (ZOCOR) 10 MG tablet Take 1 tablet (10 mg total) by mouth at bedtime. 90 tablet 1   No current facility-administered medications for this visit.     LABS/IMAGING: No results found for this or any previous visit (from the past 48 hour(s)). No results found.  WEIGHTS: Wt Readings from Last 3 Encounters:  03/15/17 248 lb 6.4 oz (112.7 kg)  10/03/16 233 lb 12.8 oz (106.1 kg)  09/07/16 237 lb (107.5 kg)    VITALS: BP 132/74 (BP Location: Left Arm, Cuff Size: Large)   Pulse 64   Ht 5\' 6"  (1.676 m)   Wt 248 lb 6.4 oz (112.7 kg)   LMP 07/11/1991   BMI 40.09 kg/m   EXAM: General appearance: alert, no distress and morbidly obese Neck: no carotid bruit, no JVD and thyroid not enlarged, symmetric, no tenderness/mass/nodules Lungs: clear to auscultation bilaterally Heart: regular rate and rhythm Abdomen: soft, non-tender; bowel sounds normal; no masses,  no organomegaly Extremities: edema 1+ pedal edema - bilateral compression stockings simply Pulses: 2+ and symmetric Skin: Pale, warm, dry Neurologic: Mental status: Alert, oriented, thought content appropriate Psych: Pleasant  EKG: Normal sinus rhythm at 64 - personally reviewed  ASSESSMENT: 1. Acute on chronic Chronic diastolic CHF 2. Palpitations-PSVT, PACs, PVCs 3. Chest pressure - low risk Myoview stress test, EF 64% (2017) 4. Dyspnea on exertion 5. Essential hypertension 6. Morbid obesity 7. Family history of coronary disease 8. OSA on BIPAP  PLAN: 1.   Mrs Owens has had some worsening swelling and weight gain. This likely represents a degree of acute on chronic diastolic congestive heart failure versus venous insufficiency. Blood pressure is now better controlled. She would benefit from an increase in her diuretics. Will increase her Lasix to 40 mg every morning and maintain 20 mg every afternoon. Repeat a metabolic profile in about a week. She is on low-dose  potassium and may need additional supplementation. I encouraged continued activity, weight loss and compliance with her BiPAP. Again blood pressure is at goal. Follow-up with me in 6 months.  Pixie Casino, MD, [facility] Attending Cardiologist [place]  Nadean Corwin Hilty 03/15/2017, 12:25 PM

## 2017-03-15 NOTE — Patient Instructions (Signed)
Medication Instructions: Dr Debara Pickett has recommended making the following medication changes: 1. INCREASE Furosemide - take 2 tablets (40 mg total) in the morning, CONTINUE 1 tablet (20 mg total) in the afternoon  Labwork: Your physician recommends that you return for lab work in 1 week.  Testing/Procedures: NONE ORDERED  Follow-up: Dr Debara Pickett recommends that you schedule a follow-up appointment in 6 months. You will receive a reminder letter in the mail two months in advance. If you don't receive a letter, please call our office to schedule the follow-up appointment.  If you need a refill on your cardiac medications before your next appointment, please call your pharmacy.

## 2017-03-27 DIAGNOSIS — Z79899 Other long term (current) drug therapy: Secondary | ICD-10-CM | POA: Diagnosis not present

## 2017-03-27 LAB — BASIC METABOLIC PANEL
BUN/Creatinine Ratio: 25 (ref 12–28)
BUN: 38 mg/dL — ABNORMAL HIGH (ref 8–27)
CO2: 22 mmol/L (ref 20–29)
CREATININE: 1.52 mg/dL — AB (ref 0.57–1.00)
Calcium: 11.3 mg/dL — ABNORMAL HIGH (ref 8.7–10.3)
Chloride: 105 mmol/L (ref 96–106)
GFR calc Af Amer: 39 mL/min/{1.73_m2} — ABNORMAL LOW (ref >59)
GFR, EST NON AFRICAN AMERICAN: 34 mL/min/{1.73_m2} — AB (ref >59)
Glucose: 125 mg/dL — ABNORMAL HIGH (ref 65–99)
Potassium: 4.7 mmol/L (ref 3.5–5.2)
SODIUM: 139 mmol/L (ref 134–144)

## 2017-03-28 ENCOUNTER — Other Ambulatory Visit: Payer: Self-pay

## 2017-04-10 ENCOUNTER — Ambulatory Visit: Payer: Medicare Other | Admitting: Family

## 2017-04-16 ENCOUNTER — Other Ambulatory Visit: Payer: Self-pay | Admitting: Family

## 2017-04-17 ENCOUNTER — Encounter: Payer: Self-pay | Admitting: Family

## 2017-04-17 ENCOUNTER — Ambulatory Visit (INDEPENDENT_AMBULATORY_CARE_PROVIDER_SITE_OTHER): Payer: Medicare Other | Admitting: Family

## 2017-04-17 DIAGNOSIS — I509 Heart failure, unspecified: Secondary | ICD-10-CM

## 2017-04-17 DIAGNOSIS — R739 Hyperglycemia, unspecified: Secondary | ICD-10-CM

## 2017-04-17 DIAGNOSIS — I10 Essential (primary) hypertension: Secondary | ICD-10-CM

## 2017-04-17 NOTE — Progress Notes (Signed)
Subjective:    Patient ID: [name], female    DOB: 1940/07/31, 76 y.o.   MRN: [id number]  HPI   [name] is a 76 yr old female who presents today for follow up.   HTN- continues bisoprolol, losartan, clonidine.  BP Readings from Last 3 Encounters:  04/17/17 (!) 133/55  03/15/17 132/74  10/03/16 (!) 147/70   CHF- she has had some weight gain which she attributes to having a tough year since losing her husband.  She is trying to get out more and become more involved in activities.   Wt Readings from Last 3 Encounters:  04/17/17 246 lb 3.2 oz (111.7 kg)  03/15/17 248 lb 6.4 oz (112.7 kg)  10/03/16 233 lb 12.8 oz (106.1 kg)   Hip pain-reports that she is not using tramadol.  Pain has been stable.   Wt Readings from Last 3 Encounters:  04/17/17 246 lb 3.2 oz (111.7 kg)  03/15/17 248 lb 6.4 oz (112.7 kg)  10/03/16 233 lb 12.8 oz (106.1 kg)      Review of Systems See HPI  Past Medical History:  Diagnosis Date  . Allergic rhinitis   . Arthritis    "knuckles" (06/17/2012)  . 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 Breast cancer ([place])    "right" (06/17/2012)  . Cataracts, bilateral   . Exertional dyspnea   . Hypertension   . Iron deficiency anemia    "hematologist watches it" (06/17/2012)  . Obesity   . OSA treated with BiPAP 03/08/2016  . Osteoarthritis    severe right hip osteoarthritis-s/p hip replacement  . Osteopenia 11/26/2009  . Thyroid disorder    "sees endocrinologist yearly" (06/17/2012)     Social History   Social History  . Marital status: Married    Spouse name: N/A  . Number of children: N/A  . Years of education: N/A   Occupational History  . Not on file.   Social History Main Topics  . Smoking status: Former Smoker    Packs/day: 0.50    Years: 10.00    Types: Cigarettes    Quit date: 07/14/1974  . Smokeless tobacco: Never Used     Comment: quit in 1976 smoked for approx. 10 years  . Alcohol use 4.2 oz/week    7 Glasses of wine  per week     Comment: drinks wine with dinner  . Drug use: No  . Sexual activity: No   Other Topics Concern  . Not on file   Social History Narrative   Retired   The patient is married and has one child and two grandchildren that live in the area.     She drinks wine with dinner.  She quit smoking in 1976, smoked for   approximately 10 years.      Moved to [place] from [place]          Past Surgical History:  Procedure Laterality Date  . BREAST BIOPSY  ` 1992   "bilaterlly" (06/17/2012)  . EYE SURGERY  2011   cataract removal both eyes  . FRACTURE SURGERY Right 01/01/14   Has pins. Procedure performed at [facility]  07/26/2012   Procedure: CLOSED MANIPULATION HIP;  Surgeon: Nita Sells, MD;  Location: [facility];  Service: Orthopedics;  Laterality: Right;  . MASTECTOMY  1993?   bilateral mastectomy  . TONSILLECTOMY  1949  . TOTAL HIP ARTHROPLASTY  04/2006   right hip   . TOTAL HIP REVISION  06/17/2012   "right" (06/17/2012)  . TOTAL HIP REVISION  06/17/2012   Procedure: TOTAL HIP REVISION;  Surgeon: Kerin Salen, MD;  Location: [facility];  Service: Orthopedics;  Laterality: Right;    Family History  Problem Relation Age of Onset  . Heart failure Father        deceased age 67  . Diabetes Father   . Alcohol abuse Father   . Breast cancer Mother        deceased at age 64 secondary to breast cancer  . Liver disease Sister        Liver failure--deceased  . Dementia Maternal Grandmother   . Colon cancer Neg Hx   . Esophageal cancer Neg Hx   . Rectal cancer Neg Hx   . Stomach cancer Neg Hx     Allergies  Allergen Reactions  . Amlodipine     Swelling   . Sulfonamide Derivatives Swelling    REACTION: Swelling of the face; "eyes swelled shut"  . Chocolate Other (See Comments)    Sinus problems    Current Outpatient Prescriptions on File Prior to Visit  Medication Sig Dispense Refill  . acetaminophen (TYLENOL) 325 MG  tablet Take 650 mg by mouth every evening.     . bisoprolol (ZEBETA) 10 MG tablet Take 1 tablet by mouth 2 (two) times daily.    . cloNIDine (CATAPRES) 0.1 MG tablet Take 1 tablet (0.1 mg total) by mouth 2 (two) times daily. 180 tablet 1  . diphenhydramine-acetaminophen (TYLENOL PM) 25-500 MG TABS tablet Take 1 tablet by mouth at bedtime as needed.    . fexofenadine (ALLEGRA) 180 MG tablet Take 180 mg by mouth daily as needed. For seasonal allergies    . fluticasone (FLONASE) 50 MCG/ACT nasal spray USE 1 SPRAY IN EACH NOSTRIL EVERY DAY 32 g 1  . furosemide (LASIX) 20 MG tablet Take 20 mg twice a day 60 tablet 3  . GELATIN PO Take 2,160 mg by mouth daily.    Marland Kitchen losartan (COZAAR) 100 MG tablet Take 1 tablet (100 mg total) by mouth daily. 90 tablet 1  . Omega-3 Fatty Acids (FISH OIL) 1000 MG CAPS Take 1,000 mg by mouth daily.    Marland Kitchen omeprazole (PRILOSEC) 40 MG capsule TAKE 1 CAPSULE (40 MG TOTAL) EVERY DAY 90 capsule 1  . Potassium Chloride ER 20 MEQ TBCR Take 1 tablet by mouth daily. 90 tablet 1  . simvastatin (ZOCOR) 10 MG tablet Take 1 tablet (10 mg total) by mouth at bedtime. 90 tablet 1   No current facility-administered medications on file prior to visit.     BP (!) 133/55 (BP Location: Right Arm, Cuff Size: Large)   Pulse 67   Temp 98.2 F (36.8 C) (Oral)   Resp 16   Ht 5\' 6"  (1.676 m)   Wt 246 lb 3.2 oz (111.7 kg)   LMP 07/11/1991   SpO2 99%   BMI 39.74 kg/m       Objective:   Physical Exam  Constitutional: She appears well-developed and well-nourished.  Cardiovascular: Normal rate, regular rhythm and normal heart sounds.   No murmur heard. Pulmonary/Chest: Effort normal and breath sounds normal. No respiratory distress. She has no wheezes.  Musculoskeletal:  2+ bilateral LE edema  Psychiatric: She has a normal mood and affect. Her behavior is normal. Judgment and thought content normal.          Assessment & Plan:  Chronic hip pain- stable.  Removed tramadol from med  list.   Hypercalcemia- stop calcium supplement. Has history of hyperparathyroid.  Follow up with Endo- see mychart message.

## 2017-04-17 NOTE — Patient Instructions (Addendum)
Stop calcium.

## 2017-04-18 NOTE — Telephone Encounter (Signed)
Rx request from [facility] on furosemide is for three times daily dosing. Current med list indicates twice daily dosing. Confirmed with pt that rx was reduced by cardiology to twice daily and [name] is aware of that.  Please advise if ok to send rx with twice daily dosing or does cardiology need to manage rx?

## 2017-04-19 ENCOUNTER — Telehealth: Payer: Self-pay | Admitting: Family

## 2017-04-19 NOTE — Telephone Encounter (Signed)
See mychart.  

## 2017-04-19 NOTE — Assessment & Plan Note (Signed)
Stable on current meds.  Continue same. 

## 2017-04-19 NOTE — Assessment & Plan Note (Signed)
Appears clinically stable on current dose of lasix. Continues to follow with cardiology.

## 2017-04-20 NOTE — Telephone Encounter (Signed)
Yes, I saw response. Will review lab results then possible get back in with endo.  Tks.

## 2017-04-20 NOTE — Telephone Encounter (Signed)
See mychart message. Is she taking a calcium supplement? If so, please d/c. Return in 2 weeks for labs (already placed)

## 2017-04-20 NOTE — Telephone Encounter (Signed)
Pt is aware and has already stopped calcium. She has a lab appt scheduled for 05/01/17.  Did you see her response about her endocrinologist?

## 2017-04-30 ENCOUNTER — Other Ambulatory Visit: Payer: Self-pay | Admitting: Family

## 2017-04-30 NOTE — Telephone Encounter (Signed)
Rx approved and sent to the pharmacy by e-script.//AB/CMA 

## 2017-05-01 ENCOUNTER — Other Ambulatory Visit (INDEPENDENT_AMBULATORY_CARE_PROVIDER_SITE_OTHER): Payer: Medicare Other

## 2017-05-01 DIAGNOSIS — R739 Hyperglycemia, unspecified: Secondary | ICD-10-CM | POA: Diagnosis not present

## 2017-05-01 LAB — BASIC METABOLIC PANEL
BUN: 30 mg/dL — ABNORMAL HIGH (ref 6–23)
CALCIUM: 10.3 mg/dL (ref 8.4–10.5)
CO2: 27 mEq/L (ref 19–32)
CREATININE: 1.37 mg/dL — AB (ref 0.40–1.20)
Chloride: 102 mEq/L (ref 96–112)
GFR: 40.04 mL/min — AB (ref >60.00)
Glucose, Bld: 108 mg/dL — ABNORMAL HIGH (ref 70–99)
Potassium: 4.1 mEq/L (ref 3.5–5.1)
Sodium: 137 mEq/L (ref 135–145)

## 2017-05-01 LAB — HEMOGLOBIN A1C: HEMOGLOBIN A1C: 6.2 % (ref 4.6–6.5)

## 2017-05-02 LAB — PTH, INTACT AND CALCIUM
CALCIUM: 10.3 mg/dL (ref 8.6–10.4)
PTH: 187 pg/mL — ABNORMAL HIGH (ref 14–64)

## 2017-05-03 ENCOUNTER — Telehealth: Payer: Self-pay | Admitting: Family

## 2017-05-03 DIAGNOSIS — E213 Hyperparathyroidism, unspecified: Secondary | ICD-10-CM

## 2017-05-03 NOTE — Telephone Encounter (Signed)
See my chart message

## 2017-06-07 DIAGNOSIS — M25551 Pain in right hip: Secondary | ICD-10-CM | POA: Diagnosis not present

## 2017-06-07 DIAGNOSIS — M7061 Trochanteric bursitis, right hip: Secondary | ICD-10-CM | POA: Diagnosis not present

## 2017-06-22 DIAGNOSIS — H52203 Unspecified astigmatism, bilateral: Secondary | ICD-10-CM | POA: Diagnosis not present

## 2017-06-22 DIAGNOSIS — Z961 Presence of intraocular lens: Secondary | ICD-10-CM | POA: Diagnosis not present

## 2017-06-22 DIAGNOSIS — H43813 Vitreous degeneration, bilateral: Secondary | ICD-10-CM | POA: Diagnosis not present

## 2017-06-26 DIAGNOSIS — M25551 Pain in right hip: Secondary | ICD-10-CM | POA: Diagnosis not present

## 2017-06-26 DIAGNOSIS — M6281 Muscle weakness (generalized): Secondary | ICD-10-CM | POA: Diagnosis not present

## 2017-06-26 DIAGNOSIS — R2689 Other abnormalities of gait and mobility: Secondary | ICD-10-CM | POA: Diagnosis not present

## 2017-06-28 DIAGNOSIS — R2689 Other abnormalities of gait and mobility: Secondary | ICD-10-CM | POA: Diagnosis not present

## 2017-06-28 DIAGNOSIS — M25551 Pain in right hip: Secondary | ICD-10-CM | POA: Diagnosis not present

## 2017-06-28 DIAGNOSIS — M6281 Muscle weakness (generalized): Secondary | ICD-10-CM | POA: Diagnosis not present

## 2017-07-04 DIAGNOSIS — M5442 Lumbago with sciatica, left side: Secondary | ICD-10-CM | POA: Diagnosis not present

## 2017-07-07 DIAGNOSIS — M546 Pain in thoracic spine: Secondary | ICD-10-CM | POA: Diagnosis not present

## 2017-07-07 DIAGNOSIS — M25562 Pain in left knee: Secondary | ICD-10-CM | POA: Diagnosis not present

## 2017-07-07 DIAGNOSIS — S8992XA Unspecified injury of left lower leg, initial encounter: Secondary | ICD-10-CM | POA: Diagnosis not present

## 2017-07-07 DIAGNOSIS — T148XXA Other injury of unspecified body region, initial encounter: Secondary | ICD-10-CM | POA: Diagnosis not present

## 2017-07-07 DIAGNOSIS — S8002XA Contusion of left knee, initial encounter: Secondary | ICD-10-CM | POA: Diagnosis not present

## 2017-07-09 DIAGNOSIS — M545 Low back pain: Secondary | ICD-10-CM | POA: Diagnosis not present

## 2017-07-11 DIAGNOSIS — M545 Low back pain: Secondary | ICD-10-CM | POA: Diagnosis not present

## 2017-07-13 ENCOUNTER — Encounter: Payer: Self-pay | Admitting: Internal Medicine

## 2017-07-13 ENCOUNTER — Encounter: Payer: Self-pay | Admitting: Family

## 2017-07-13 ENCOUNTER — Telehealth: Payer: Self-pay | Admitting: Internal Medicine

## 2017-07-13 DIAGNOSIS — M48061 Spinal stenosis, lumbar region without neurogenic claudication: Secondary | ICD-10-CM | POA: Diagnosis not present

## 2017-07-13 NOTE — Telephone Encounter (Signed)
   Primary Cardiologist: No primary care provider on file.  Chart reviewed as part of pre-operative protocol coverage. Patient was contacted 07/13/2017 in reference to pre-operative risk assessment for pending surgery as outlined below.  [name] was last seen on 03/15/17  by Dr. Debara Pickett.  Since that day, [name] has done well on cardiac stand point. She denies any angina, palpitation or CHF symptoms.   Therefore, based on ACC/AHA guidelines, the patient would be at acceptable risk for the planned procedure without further cardiovascular testing.   She is not taking any ASA daily. Please forwards recommendations to surgeon.   [place], [place] 07/13/2017, 3:47 PM

## 2017-07-13 NOTE — Telephone Encounter (Signed)
-----   Message -----  From: Teressa Senter  Sent: 07/13/2017 12:08 PM  To: [facility] Clinical Pool  Subject: Non-Urgent Medical Question             ----- Message from [place], Generic sent at 07/13/2017 12:08 PM EST -----    Dr Debara Pickett, I am going to have back surgery as soon as the can schedule me. The surgeon is Dr Gust Brooms with [facility]. They are requesting a release from you for me to have the surgery. I am in a good deal of pain so I urge you to respond quickly. Thank you.

## 2017-07-13 NOTE — Telephone Encounter (Signed)
Called [facility] and got their fax number to send over pts clearance.  Fax has been sent.

## 2017-07-13 NOTE — Telephone Encounter (Signed)
   [facility] Pre-operative Risk Assessment    Request for surgical clearance:  1. What type of surgery is being performed? Back surgery (not specified per patient)   2. When is this surgery scheduled? Not yet scheduled   3. Are there any medications that need to be held prior to surgery and how long? Not specified per patient   4. Practice name and name of physician performing surgery? Dr. Lynann Bologna @  [facility]  5. What is your office phone and fax number? Patient did not provide this info   6. Anesthesia type (None, local, MAC, general) ? Not specified per patient    [name] 07/13/2017, 3:06 PM  _________________________________________________________________   (provider comments below)

## 2017-07-18 ENCOUNTER — Other Ambulatory Visit: Payer: Self-pay | Admitting: Orthopedic Surgery

## 2017-07-18 ENCOUNTER — Other Ambulatory Visit: Payer: Self-pay | Admitting: Family

## 2017-07-18 ENCOUNTER — Telehealth: Payer: Self-pay | Admitting: Internal Medicine

## 2017-07-18 NOTE — Telephone Encounter (Signed)
[name] calling to follow up on the clearance that was sent over. Please refax to [phone] [id number]:[id number],TCN needs this asap please.

## 2017-07-19 NOTE — Telephone Encounter (Signed)
Please seen previous clearance letter on 07/13/2017 and fax to orthopedic service  Thanks  Signed, Almyra Deforest PA Pager: [phone]

## 2017-07-19 NOTE — Telephone Encounter (Signed)
Clearance letter faxed to Elmyra Ricks at [facility] at fax # [phone].

## 2017-07-23 DIAGNOSIS — S82892A Other fracture of left lower leg, initial encounter for closed fracture: Secondary | ICD-10-CM | POA: Diagnosis not present

## 2017-07-23 DIAGNOSIS — S82832A Other fracture of upper and lower end of left fibula, initial encounter for closed fracture: Secondary | ICD-10-CM | POA: Diagnosis not present

## 2017-07-23 DIAGNOSIS — T148XXA Other injury of unspecified body region, initial encounter: Secondary | ICD-10-CM | POA: Diagnosis not present

## 2017-07-23 DIAGNOSIS — S99912A Unspecified injury of left ankle, initial encounter: Secondary | ICD-10-CM | POA: Diagnosis not present

## 2017-07-24 DIAGNOSIS — M25572 Pain in left ankle and joints of left foot: Secondary | ICD-10-CM | POA: Diagnosis not present

## 2017-07-24 DIAGNOSIS — M48061 Spinal stenosis, lumbar region without neurogenic claudication: Secondary | ICD-10-CM | POA: Diagnosis not present

## 2017-07-25 ENCOUNTER — Other Ambulatory Visit: Payer: Self-pay | Admitting: Orthopedic Surgery

## 2017-07-26 DIAGNOSIS — M6281 Muscle weakness (generalized): Secondary | ICD-10-CM | POA: Diagnosis not present

## 2017-07-26 DIAGNOSIS — R2689 Other abnormalities of gait and mobility: Secondary | ICD-10-CM | POA: Diagnosis not present

## 2017-07-26 DIAGNOSIS — Z4789 Encounter for other orthopedic aftercare: Secondary | ICD-10-CM | POA: Diagnosis not present

## 2017-07-26 DIAGNOSIS — I10 Essential (primary) hypertension: Secondary | ICD-10-CM | POA: Diagnosis not present

## 2017-07-26 DIAGNOSIS — K219 Gastro-esophageal reflux disease without esophagitis: Secondary | ICD-10-CM | POA: Diagnosis not present

## 2017-07-27 ENCOUNTER — Inpatient Hospital Stay (HOSPITAL_COMMUNITY)
Admission: RE | Admit: 2017-07-27 | Discharge: 2017-07-27 | Disposition: A | Discharge status: Home / Self Care | Payer: Medicare Other | Source: Ambulatory Visit

## 2017-07-27 DIAGNOSIS — K219 Gastro-esophageal reflux disease without esophagitis: Secondary | ICD-10-CM | POA: Diagnosis not present

## 2017-07-27 DIAGNOSIS — M48061 Spinal stenosis, lumbar region without neurogenic claudication: Secondary | ICD-10-CM | POA: Diagnosis not present

## 2017-07-27 DIAGNOSIS — R2689 Other abnormalities of gait and mobility: Secondary | ICD-10-CM | POA: Diagnosis not present

## 2017-07-27 DIAGNOSIS — I10 Essential (primary) hypertension: Secondary | ICD-10-CM | POA: Diagnosis not present

## 2017-07-27 DIAGNOSIS — S82892A Other fracture of left lower leg, initial encounter for closed fracture: Secondary | ICD-10-CM | POA: Diagnosis not present

## 2017-07-27 DIAGNOSIS — M6281 Muscle weakness (generalized): Secondary | ICD-10-CM | POA: Diagnosis not present

## 2017-07-27 DIAGNOSIS — Z4789 Encounter for other orthopedic aftercare: Secondary | ICD-10-CM | POA: Diagnosis not present

## 2017-07-27 DIAGNOSIS — M1990 Unspecified osteoarthritis, unspecified site: Secondary | ICD-10-CM | POA: Diagnosis not present

## 2017-07-27 DIAGNOSIS — W19XXXA Unspecified fall, initial encounter: Secondary | ICD-10-CM | POA: Diagnosis not present

## 2017-07-27 MED ORDER — DEXTROSE 5 % IV SOLN
3.0000 g | INTRAVENOUS | Status: AC
  Administered 2017-07-30: 3 g via INTRAVENOUS
  Filled 2017-07-27: qty 3

## 2017-07-27 NOTE — Pre-Procedure Instructions (Signed)
[name]  07/27/2017      [id number] - [place], [place] - [address] [facility] [address] Phone: [phone] Fax: [phone]    Your procedure is scheduled on 07/30/2017.  Report to [facility] Admitting at 1000 A.M.  Call this number if you have problems the morning of surgery:  [phone]   Remember:  Do not eat food or drink liquids after midnight.   Continue all medications as directed by your physician except follow these medication instructions before surgery below   Take these medicines the morning of surgery with A SIP OF WATER: Acetaminophen (Tylenol) - if needed for pain Bisoprolol (Zebeta) Clonidine (Catapres) Fexofenadine (Allegra) - if needed for allergies Fluticasone (Flonase) - if needed for allergies Hydrocodone-acetaminophen (Norco) - if needed for pain Omeprazole (Prilosec) - if needed for heartburn Polyethyl Glycol-Propyl Glycol eye drops - if needed for dry eyes  7 days prior to surgery STOP taking any Aspirin (unless otherwise instructed by your surgeon), Aleve, Naproxen, Ibuprofen, Motrin, Advil, Goody's, BC's, all herbal medications, fish oil, and all vitamins, however DO NOT stop taking your potassium chloride    Do not wear jewelry, make-up or nail polish.  Do not wear lotions, powders, or perfumes, or deodorant.  Do not shave 48 hours prior to surgery.    Do not bring valuables to the hospital.  [facility] is not responsible for any belongings or valuables.  Hearing aids, eyeglasses, contacts, dentures or bridgework may not be worn into surgery.  Leave your suitcase in the car.  After surgery it may be brought to your room.  For patients admitted to the hospital, discharge time will be determined by your treatment team.  Patients discharged the day of surgery will not be allowed to drive home.   Name and phone number of your driver:    Special instructions:   [facility]- Preparing For  Surgery  Before surgery, you can play an important role. Because skin is not sterile, your skin needs to be as free of germs as possible. You can reduce the number of germs on your skin by washing with CHG (chlorahexidine gluconate) Soap before surgery.  CHG is an antiseptic cleaner which kills germs and bonds with the skin to continue killing germs even after washing.  Please do not use if you have an allergy to CHG or antibacterial soaps. If your skin becomes reddened/irritated stop using the CHG.  Do not shave (including legs and underarms) for at least 48 hours prior to first CHG shower. It is OK to shave your face.  Please follow these instructions carefully.   1. Shower the NIGHT BEFORE SURGERY and the MORNING OF SURGERY with CHG.   2. If you chose to wash your hair, wash your hair first as usual with your normal shampoo.  3. After you shampoo, rinse your hair and body thoroughly to remove the shampoo.  4. Use CHG as you would any other liquid soap. You can apply CHG directly to the skin and wash gently with a scrungie or a clean washcloth.   5. Apply the CHG Soap to your body ONLY FROM THE NECK DOWN.  Do not use on open wounds or open sores. Avoid contact with your eyes, ears, mouth and genitals (private parts). Wash Face and genitals (private parts)  with your normal soap.  6. Wash thoroughly, paying special attention to the area where your surgery will be performed.  7. Thoroughly rinse  your body with warm water from the neck down.  8. DO NOT shower/wash with your normal soap after using and rinsing off the CHG Soap.  9. Pat yourself dry with a CLEAN TOWEL.  10. Wear CLEAN PAJAMAS to bed the night before surgery, wear comfortable clothes the morning of surgery  11. Place CLEAN SHEETS on your bed the night of your first shower and DO NOT SLEEP WITH PETS.    Day of Surgery: Shower as stated above. Do not apply any deodorants/lotions. Please wear clean clothes to the  hospital/surgery center.      Please read over the following fact sheets that you were given.

## 2017-07-28 DIAGNOSIS — R2689 Other abnormalities of gait and mobility: Secondary | ICD-10-CM | POA: Diagnosis not present

## 2017-07-28 DIAGNOSIS — M6281 Muscle weakness (generalized): Secondary | ICD-10-CM | POA: Diagnosis not present

## 2017-07-28 DIAGNOSIS — I10 Essential (primary) hypertension: Secondary | ICD-10-CM | POA: Diagnosis not present

## 2017-07-28 DIAGNOSIS — Z4789 Encounter for other orthopedic aftercare: Secondary | ICD-10-CM | POA: Diagnosis not present

## 2017-07-28 DIAGNOSIS — K219 Gastro-esophageal reflux disease without esophagitis: Secondary | ICD-10-CM | POA: Diagnosis not present

## 2017-07-28 NOTE — Progress Notes (Signed)
Patient resident at [facility] temporarily. Spoke to [employer], LPN and give the following instructions: Patient needs to arrive at 0930 to [facility] Stay. NPO including gum or candy after midnight. Do not wear lotions, powders, perfume, or jewelry. Leave valuables, dentures glasses, and contacts at nursing home. Give tylenol or hydrocodone (prn), bisoprolol, clonidine, allegra, Flonase, and omeprazole with a sip of water. Send MAR with medications given morning of surgery, EKG, and current medical history.

## 2017-07-28 NOTE — Progress Notes (Signed)
Left message with the following instructions for patient: Arrive at 0930 to [facility] Entrance A. NPO including gum or candy after midnight. Do not wear lotions, powders, perfume, or jewelry. Contact, glasses, and contacts to be removed prior to going to the OR. If not admitted will need someone to stay with you for 24 hours after surgery. Please call [phone] if you get this message before 1800 to review medical history or ask any questions.

## 2017-07-29 DIAGNOSIS — M6281 Muscle weakness (generalized): Secondary | ICD-10-CM | POA: Diagnosis not present

## 2017-07-29 DIAGNOSIS — R2689 Other abnormalities of gait and mobility: Secondary | ICD-10-CM | POA: Diagnosis not present

## 2017-07-29 DIAGNOSIS — Z4789 Encounter for other orthopedic aftercare: Secondary | ICD-10-CM | POA: Diagnosis not present

## 2017-07-29 DIAGNOSIS — I10 Essential (primary) hypertension: Secondary | ICD-10-CM | POA: Diagnosis not present

## 2017-07-29 DIAGNOSIS — K219 Gastro-esophageal reflux disease without esophagitis: Secondary | ICD-10-CM | POA: Diagnosis not present

## 2017-07-30 ENCOUNTER — Inpatient Hospital Stay (HOSPITAL_COMMUNITY)
Admission: RE | Admit: 2017-07-30 | Discharge: 2017-08-02 | DRG: 494 | Disposition: A | Discharge status: Skilled Nursing Facility | Payer: Medicare Other | Source: Ambulatory Visit | Attending: Orthopedic Surgery | Admitting: Orthopedic Surgery

## 2017-07-30 ENCOUNTER — Encounter (HOSPITAL_COMMUNITY)
Admission: RE | Disposition: A | Discharge status: Skilled Nursing Facility | Payer: Self-pay | Source: Ambulatory Visit | Attending: Orthopedic Surgery

## 2017-07-30 ENCOUNTER — Inpatient Hospital Stay (HOSPITAL_COMMUNITY): Payer: Medicare Other | Admitting: Certified Registered"

## 2017-07-30 ENCOUNTER — Inpatient Hospital Stay (HOSPITAL_COMMUNITY): Payer: Medicare Other

## 2017-07-30 ENCOUNTER — Other Ambulatory Visit: Payer: Self-pay

## 2017-07-30 ENCOUNTER — Encounter (HOSPITAL_COMMUNITY): Payer: Self-pay | Admitting: Anesthesiology

## 2017-07-30 DIAGNOSIS — S8262XA Displaced fracture of lateral malleolus of left fibula, initial encounter for closed fracture: Principal | ICD-10-CM

## 2017-07-30 DIAGNOSIS — G4739 Other sleep apnea: Secondary | ICD-10-CM | POA: Diagnosis not present

## 2017-07-30 DIAGNOSIS — I10 Essential (primary) hypertension: Secondary | ICD-10-CM | POA: Diagnosis present

## 2017-07-30 DIAGNOSIS — Z6837 Body mass index (BMI) 37.0-37.9, adult: Secondary | ICD-10-CM | POA: Diagnosis not present

## 2017-07-30 DIAGNOSIS — L602 Onychogryphosis: Secondary | ICD-10-CM | POA: Diagnosis present

## 2017-07-30 DIAGNOSIS — Z87891 Personal history of nicotine dependence: Secondary | ICD-10-CM

## 2017-07-30 DIAGNOSIS — R2689 Other abnormalities of gait and mobility: Secondary | ICD-10-CM | POA: Diagnosis not present

## 2017-07-30 DIAGNOSIS — W19XXXA Unspecified fall, initial encounter: Secondary | ICD-10-CM | POA: Diagnosis present

## 2017-07-30 DIAGNOSIS — Z9842 Cataract extraction status, left eye: Secondary | ICD-10-CM

## 2017-07-30 DIAGNOSIS — Z79899 Other long term (current) drug therapy: Secondary | ICD-10-CM | POA: Diagnosis not present

## 2017-07-30 DIAGNOSIS — R278 Other lack of coordination: Secondary | ICD-10-CM | POA: Diagnosis not present

## 2017-07-30 DIAGNOSIS — Z9013 Acquired absence of bilateral breasts and nipples: Secondary | ICD-10-CM | POA: Diagnosis not present

## 2017-07-30 DIAGNOSIS — M79605 Pain in left leg: Secondary | ICD-10-CM | POA: Diagnosis present

## 2017-07-30 DIAGNOSIS — S82832A Other fracture of upper and lower end of left fibula, initial encounter for closed fracture: Secondary | ICD-10-CM | POA: Diagnosis present

## 2017-07-30 DIAGNOSIS — Z419 Encounter for procedure for purposes other than remedying health state, unspecified: Secondary | ICD-10-CM

## 2017-07-30 DIAGNOSIS — Z853 Personal history of malignant neoplasm of breast: Secondary | ICD-10-CM

## 2017-07-30 DIAGNOSIS — G4733 Obstructive sleep apnea (adult) (pediatric): Secondary | ICD-10-CM | POA: Diagnosis present

## 2017-07-30 DIAGNOSIS — M545 Low back pain, unspecified: Secondary | ICD-10-CM | POA: Diagnosis present

## 2017-07-30 DIAGNOSIS — Z9841 Cataract extraction status, right eye: Secondary | ICD-10-CM

## 2017-07-30 DIAGNOSIS — Z4789 Encounter for other orthopedic aftercare: Secondary | ICD-10-CM | POA: Diagnosis not present

## 2017-07-30 DIAGNOSIS — G8918 Other acute postprocedural pain: Secondary | ICD-10-CM | POA: Diagnosis not present

## 2017-07-30 DIAGNOSIS — S8262XS Displaced fracture of lateral malleolus of left fibula, sequela: Secondary | ICD-10-CM | POA: Diagnosis not present

## 2017-07-30 DIAGNOSIS — G8911 Acute pain due to trauma: Secondary | ICD-10-CM | POA: Diagnosis not present

## 2017-07-30 DIAGNOSIS — Z96641 Presence of right artificial hip joint: Secondary | ICD-10-CM | POA: Diagnosis present

## 2017-07-30 DIAGNOSIS — M6281 Muscle weakness (generalized): Secondary | ICD-10-CM | POA: Diagnosis not present

## 2017-07-30 DIAGNOSIS — K219 Gastro-esophageal reflux disease without esophagitis: Secondary | ICD-10-CM | POA: Diagnosis not present

## 2017-07-30 DIAGNOSIS — R262 Difficulty in walking, not elsewhere classified: Secondary | ICD-10-CM | POA: Diagnosis not present

## 2017-07-30 HISTORY — DX: Other fracture of upper and lower end of left fibula, initial encounter for closed fracture: S82.832A

## 2017-07-30 HISTORY — PX: ORIF ANKLE FRACTURE: SHX5408

## 2017-07-30 HISTORY — DX: Other fracture of unspecified lower leg, initial encounter for closed fracture: S82.899A

## 2017-07-30 LAB — CBC
HEMATOCRIT: 33.9 % — AB (ref 36.0–46.0)
HEMOGLOBIN: 11.4 g/dL — AB (ref 12.0–15.0)
MCH: 32.7 pg (ref 26.0–34.0)
MCHC: 33.6 g/dL (ref 30.0–36.0)
MCV: 97.1 fL (ref 78.0–100.0)
Platelets: 319 10*3/uL (ref 150–400)
RBC: 3.49 MIL/uL — AB (ref 3.87–5.11)
RDW: 12.9 % (ref 11.5–15.5)
WBC: 10.8 10*3/uL — AB (ref 4.0–10.5)

## 2017-07-30 LAB — BASIC METABOLIC PANEL
ANION GAP: 11 (ref 5–15)
BUN: 15 mg/dL (ref 6–20)
CO2: 19 mmol/L — ABNORMAL LOW (ref 22–32)
Calcium: 10.1 mg/dL (ref 8.9–10.3)
Chloride: 110 mmol/L (ref 101–111)
Creatinine, Ser: 1.1 mg/dL — ABNORMAL HIGH (ref 0.44–1.00)
GFR calc Af Amer: 56 mL/min — ABNORMAL LOW (ref >60)
GFR, EST NON AFRICAN AMERICAN: 48 mL/min — AB (ref >60)
Glucose, Bld: 116 mg/dL — ABNORMAL HIGH (ref 65–99)
POTASSIUM: 3.7 mmol/L (ref 3.5–5.1)
SODIUM: 140 mmol/L (ref 135–145)

## 2017-07-30 SURGERY — OPEN REDUCTION INTERNAL FIXATION (ORIF) ANKLE FRACTURE
Anesthesia: Regional | Site: Ankle | Laterality: Left

## 2017-07-30 MED ORDER — KETOROLAC TROMETHAMINE 15 MG/ML IJ SOLN
15.0000 mg | Freq: Three times a day (TID) | INTRAMUSCULAR | Status: AC
  Administered 2017-07-30 – 2017-07-31 (×4): 15 mg via INTRAVENOUS
  Filled 2017-07-30 (×3): qty 1

## 2017-07-30 MED ORDER — BISOPROLOL FUMARATE 10 MG PO TABS
20.0000 mg | ORAL_TABLET | Freq: Every day | ORAL | Status: DC
  Administered 2017-07-31 – 2017-08-02 (×3): 20 mg via ORAL
  Filled 2017-07-30 (×3): qty 2

## 2017-07-30 MED ORDER — DOCUSATE SODIUM 100 MG PO CAPS
100.0000 mg | ORAL_CAPSULE | Freq: Two times a day (BID) | ORAL | Status: DC
  Administered 2017-07-30 – 2017-08-01 (×3): 100 mg via ORAL
  Filled 2017-07-30 (×6): qty 1

## 2017-07-30 MED ORDER — BUPIVACAINE-EPINEPHRINE (PF) 0.5% -1:200000 IJ SOLN
INTRAMUSCULAR | Status: DC | PRN
  Administered 2017-07-30: 5 mL via PERINEURAL

## 2017-07-30 MED ORDER — OXYCODONE HCL 5 MG PO TABS
ORAL_TABLET | ORAL | Status: AC
  Filled 2017-07-30: qty 2

## 2017-07-30 MED ORDER — HYDROMORPHONE HCL 1 MG/ML IJ SOLN
INTRAMUSCULAR | Status: AC
  Administered 2017-07-30: 1 mg via INTRAVENOUS
  Filled 2017-07-30: qty 1

## 2017-07-30 MED ORDER — METHOCARBAMOL 500 MG PO TABS
ORAL_TABLET | ORAL | Status: AC
  Filled 2017-07-30: qty 1

## 2017-07-30 MED ORDER — ONDANSETRON HCL 4 MG/2ML IJ SOLN
INTRAMUSCULAR | Status: DC | PRN
  Administered 2017-07-30: 4 mg via INTRAVENOUS

## 2017-07-30 MED ORDER — SIMVASTATIN 10 MG PO TABS
10.0000 mg | ORAL_TABLET | Freq: Every day | ORAL | Status: DC
  Administered 2017-07-30 – 2017-08-01 (×3): 10 mg via ORAL
  Filled 2017-07-30 (×3): qty 1

## 2017-07-30 MED ORDER — LIDOCAINE 2% (20 MG/ML) 5 ML SYRINGE
INTRAMUSCULAR | Status: DC | PRN
  Administered 2017-07-30: 60 mg via INTRAVENOUS

## 2017-07-30 MED ORDER — ROPIVACAINE HCL 7.5 MG/ML IJ SOLN
INTRAMUSCULAR | Status: DC | PRN
  Administered 2017-07-30: 20 mL via PERINEURAL

## 2017-07-30 MED ORDER — SODIUM CHLORIDE 0.9 % IV SOLN
INTRAVENOUS | Status: DC
  Administered 2017-07-30 – 2017-07-31 (×2): via INTRAVENOUS

## 2017-07-30 MED ORDER — 0.9 % SODIUM CHLORIDE (POUR BTL) OPTIME
TOPICAL | Status: DC | PRN
  Administered 2017-07-30: 1000 mL

## 2017-07-30 MED ORDER — PHENYLEPHRINE HCL 10 MG/ML IJ SOLN
INTRAMUSCULAR | Status: DC | PRN
  Administered 2017-07-30 (×2): 80 ug via INTRAVENOUS

## 2017-07-30 MED ORDER — POTASSIUM CHLORIDE ER 20 MEQ PO TBCR: 20.0000 meq | EXTENDED_RELEASE_TABLET | Freq: Every day | ORAL | Status: DC

## 2017-07-30 MED ORDER — ONDANSETRON HCL 4 MG/2ML IJ SOLN: 4.0000 mg | Freq: Four times a day (QID) | INTRAMUSCULAR | Status: DC | PRN

## 2017-07-30 MED ORDER — CHLORHEXIDINE GLUCONATE 4 % EX LIQD: 60.0000 mL | Freq: Once | CUTANEOUS | Status: DC

## 2017-07-30 MED ORDER — ASPIRIN EC 325 MG PO TBEC
325.0000 mg | DELAYED_RELEASE_TABLET | Freq: Two times a day (BID) | ORAL | Status: DC
  Administered 2017-07-30 – 2017-08-02 (×6): 325 mg via ORAL
  Filled 2017-07-30 (×6): qty 1

## 2017-07-30 MED ORDER — OXYCODONE-ACETAMINOPHEN 5-325 MG PO TABS: 1.0000 | ORAL_TABLET | Freq: Four times a day (QID) | ORAL | 0 refills | Status: DC | PRN

## 2017-07-30 MED ORDER — FENTANYL CITRATE (PF) 100 MCG/2ML IJ SOLN
INTRAMUSCULAR | Status: DC | PRN
  Administered 2017-07-30 (×2): 25 ug via INTRAVENOUS
  Administered 2017-07-30: 50 ug via INTRAVENOUS
  Administered 2017-07-30: 25 ug via INTRAVENOUS
  Administered 2017-07-30 (×2): 50 ug via INTRAVENOUS
  Administered 2017-07-30: 25 ug via INTRAVENOUS

## 2017-07-30 MED ORDER — FUROSEMIDE 20 MG PO TABS
20.0000 mg | ORAL_TABLET | Freq: Two times a day (BID) | ORAL | Status: DC
  Administered 2017-07-31 – 2017-08-02 (×5): 20 mg via ORAL
  Filled 2017-07-30 (×6): qty 1

## 2017-07-30 MED ORDER — FENTANYL CITRATE (PF) 100 MCG/2ML IJ SOLN
INTRAMUSCULAR | Status: AC
  Administered 2017-07-30: 50 ug via INTRAVENOUS
  Filled 2017-07-30: qty 2

## 2017-07-30 MED ORDER — METHOCARBAMOL 1000 MG/10ML IJ SOLN
500.0000 mg | Freq: Four times a day (QID) | INTRAMUSCULAR | Status: DC | PRN
  Filled 2017-07-30: qty 5

## 2017-07-30 MED ORDER — FENTANYL CITRATE (PF) 100 MCG/2ML IJ SOLN
50.0000 ug | Freq: Once | INTRAMUSCULAR | Status: AC
  Administered 2017-07-30: 50 ug via INTRAVENOUS
  Filled 2017-07-30: qty 1

## 2017-07-30 MED ORDER — METHOCARBAMOL 500 MG PO TABS
500.0000 mg | ORAL_TABLET | Freq: Four times a day (QID) | ORAL | Status: DC | PRN
  Administered 2017-07-30 – 2017-08-01 (×3): 500 mg via ORAL
  Filled 2017-07-30 (×2): qty 1

## 2017-07-30 MED ORDER — ACETAMINOPHEN 650 MG RE SUPP: 650.0000 mg | RECTAL | Status: DC | PRN

## 2017-07-30 MED ORDER — HYDROMORPHONE HCL 1 MG/ML IJ SOLN
0.5000 mg | INTRAMUSCULAR | Status: DC | PRN
  Administered 2017-07-30: 1 mg via INTRAVENOUS

## 2017-07-30 MED ORDER — MIDAZOLAM HCL 2 MG/2ML IJ SOLN
INTRAMUSCULAR | Status: AC
  Administered 2017-07-30: 1 mg via INTRAVENOUS
  Filled 2017-07-30: qty 2

## 2017-07-30 MED ORDER — OXYCODONE HCL 5 MG PO TABS
5.0000 mg | ORAL_TABLET | ORAL | Status: DC | PRN
  Administered 2017-07-30 – 2017-08-01 (×6): 10 mg via ORAL
  Filled 2017-07-30 (×5): qty 2

## 2017-07-30 MED ORDER — CEFAZOLIN SODIUM-DEXTROSE 2-4 GM/100ML-% IV SOLN
2.0000 g | Freq: Four times a day (QID) | INTRAVENOUS | Status: AC
  Administered 2017-07-30 – 2017-07-31 (×3): 2 g via INTRAVENOUS
  Filled 2017-07-30 (×3): qty 100

## 2017-07-30 MED ORDER — MIDAZOLAM HCL 2 MG/2ML IJ SOLN
1.0000 mg | Freq: Once | INTRAMUSCULAR | Status: AC
  Administered 2017-07-30: 1 mg via INTRAVENOUS
  Filled 2017-07-30: qty 1

## 2017-07-30 MED ORDER — KETOROLAC TROMETHAMINE 15 MG/ML IJ SOLN
INTRAMUSCULAR | Status: AC
  Administered 2017-07-30: 15 mg via INTRAVENOUS
  Filled 2017-07-30: qty 1

## 2017-07-30 MED ORDER — PROPOFOL 10 MG/ML IV BOLUS
INTRAVENOUS | Status: DC | PRN
  Administered 2017-07-30: 180 mg via INTRAVENOUS

## 2017-07-30 MED ORDER — LOSARTAN POTASSIUM 50 MG PO TABS
100.0000 mg | ORAL_TABLET | Freq: Every day | ORAL | Status: DC
  Administered 2017-07-30 – 2017-08-02 (×4): 100 mg via ORAL
  Filled 2017-07-30 (×4): qty 2

## 2017-07-30 MED ORDER — POLYETHYLENE GLYCOL 3350 17 G PO PACK: 17.0000 g | PACK | Freq: Every day | ORAL | Status: DC | PRN

## 2017-07-30 MED ORDER — ACETAMINOPHEN 325 MG PO TABS
650.0000 mg | ORAL_TABLET | ORAL | Status: DC | PRN
  Administered 2017-07-31: 650 mg via ORAL
  Filled 2017-07-30: qty 2

## 2017-07-30 MED ORDER — DEXAMETHASONE SODIUM PHOSPHATE 10 MG/ML IJ SOLN
INTRAMUSCULAR | Status: DC | PRN
  Administered 2017-07-30: 10 mg via INTRAVENOUS

## 2017-07-30 MED ORDER — CLONIDINE HCL 0.1 MG PO TABS
0.1000 mg | ORAL_TABLET | Freq: Two times a day (BID) | ORAL | Status: DC
  Administered 2017-07-30 – 2017-08-02 (×6): 0.1 mg via ORAL
  Filled 2017-07-30 (×6): qty 1

## 2017-07-30 MED ORDER — LACTATED RINGERS IV SOLN
INTRAVENOUS | Status: DC
  Administered 2017-07-30 (×2): via INTRAVENOUS

## 2017-07-30 MED ORDER — POTASSIUM CHLORIDE ER 20 MEQ PO TBCR: 1.0000 | EXTENDED_RELEASE_TABLET | Freq: Every day | ORAL | Status: DC

## 2017-07-30 MED ORDER — ONDANSETRON HCL 4 MG PO TABS: 4.0000 mg | ORAL_TABLET | Freq: Four times a day (QID) | ORAL | Status: DC | PRN

## 2017-07-30 MED ORDER — POTASSIUM CHLORIDE CRYS ER 20 MEQ PO TBCR
20.0000 meq | EXTENDED_RELEASE_TABLET | Freq: Every day | ORAL | Status: DC
  Administered 2017-07-30 – 2017-08-02 (×4): 20 meq via ORAL
  Filled 2017-07-30 (×4): qty 1

## 2017-07-30 MED ORDER — PANTOPRAZOLE SODIUM 40 MG PO TBEC
40.0000 mg | DELAYED_RELEASE_TABLET | Freq: Every day | ORAL | Status: DC
  Administered 2017-07-31 – 2017-08-02 (×3): 40 mg via ORAL
  Filled 2017-07-30 (×3): qty 1

## 2017-07-30 SURGICAL SUPPLY — 72 items
ANKLE SYNDEMOSIS ZIPTIGHT (Ankle) ×2 IMPLANT
BANDAGE ACE 4X5 VEL STRL LF (GAUZE/BANDAGES/DRESSINGS) ×1 IMPLANT
BANDAGE ACE 6X5 VEL STRL LF (GAUZE/BANDAGES/DRESSINGS) ×1 IMPLANT
BANDAGE ELASTIC 6 VELCRO ST LF (GAUZE/BANDAGES/DRESSINGS) ×1 IMPLANT
BANDAGE ESMARK 6X9 LF (GAUZE/BANDAGES/DRESSINGS) ×1 IMPLANT
BIT DRILL 110X2.5XQCK CNCT (BIT) IMPLANT
BIT DRILL 2.5 (BIT) ×2
BIT DRL 110X2.5XQCK CNCT (BIT) ×1
BLADE CLIPPER SURG (BLADE) IMPLANT
BNDG CMPR 9X6 STRL LF SNTH (GAUZE/BANDAGES/DRESSINGS) ×1
BNDG ESMARK 6X9 LF (GAUZE/BANDAGES/DRESSINGS) ×2
COVER MAYO STAND STRL (DRAPES) ×2 IMPLANT
COVER SURGICAL LIGHT HANDLE (MISCELLANEOUS) ×2 IMPLANT
CUFF TOURNIQUET SINGLE 34IN LL (TOURNIQUET CUFF) IMPLANT
CUFF TOURNIQUET SINGLE 44IN (TOURNIQUET CUFF) ×1 IMPLANT
DEVICE FIXATION W/ZIPLOOP (Orthopedic Implant) ×1 IMPLANT
DRAPE C-ARM 42X72 X-RAY (DRAPES) ×1 IMPLANT
DRAPE C-ARMOR (DRAPES) ×1 IMPLANT
DRAPE OEC MINIVIEW 54X84 (DRAPES) IMPLANT
DRAPE U-SHAPE 47X51 STRL (DRAPES) ×2 IMPLANT
DURAPREP 26ML APPLICATOR (WOUND CARE) ×2 IMPLANT
ELECT REM PT RETURN 9FT ADLT (ELECTROSURGICAL) ×2
ELECTRODE REM PT RTRN 9FT ADLT (ELECTROSURGICAL) ×1 IMPLANT
GAUZE SPONGE 4X4 12PLY STRL (GAUZE/BANDAGES/DRESSINGS) IMPLANT
GAUZE SPONGE 4X4 12PLY STRL LF (GAUZE/BANDAGES/DRESSINGS) ×1 IMPLANT
GAUZE XEROFORM 1X8 LF (GAUZE/BANDAGES/DRESSINGS) IMPLANT
GAUZE XEROFORM 5X9 LF (GAUZE/BANDAGES/DRESSINGS) ×1 IMPLANT
GLOVE BIOGEL PI IND STRL 6.5 (GLOVE) IMPLANT
GLOVE BIOGEL PI IND STRL 8 (GLOVE) ×2 IMPLANT
GLOVE BIOGEL PI IND STRL 8.5 (GLOVE) IMPLANT
GLOVE BIOGEL PI INDICATOR 6.5 (GLOVE) ×1
GLOVE BIOGEL PI INDICATOR 8 (GLOVE) ×2
GLOVE BIOGEL PI INDICATOR 8.5 (GLOVE) ×2
GLOVE ECLIPSE 7.5 STRL STRAW (GLOVE) ×4 IMPLANT
GLOVE SURG SS PI 6.5 STRL IVOR (GLOVE) ×1 IMPLANT
GOWN STRL REUS W/ TWL LRG LVL3 (GOWN DISPOSABLE) ×1 IMPLANT
GOWN STRL REUS W/ TWL XL LVL3 (GOWN DISPOSABLE) ×2 IMPLANT
GOWN STRL REUS W/TWL LRG LVL3 (GOWN DISPOSABLE) ×4
GOWN STRL REUS W/TWL XL LVL3 (GOWN DISPOSABLE) ×4
KIT BASIN OR (CUSTOM PROCEDURE TRAY) ×2 IMPLANT
KIT ROOM TURNOVER OR (KITS) ×2 IMPLANT
MANIFOLD NEPTUNE II (INSTRUMENTS) ×1 IMPLANT
NS IRRIG 1000ML POUR BTL (IV SOLUTION) ×2 IMPLANT
PACK ORTHO EXTREMITY (CUSTOM PROCEDURE TRAY) ×2 IMPLANT
PAD ARMBOARD 7.5X6 YLW CONV (MISCELLANEOUS) ×3 IMPLANT
PAD CAST 4YDX4 CTTN HI CHSV (CAST SUPPLIES) IMPLANT
PADDING CAST COTTON 4X4 STRL (CAST SUPPLIES)
PADDING CAST COTTON 6X4 STRL (CAST SUPPLIES) ×1 IMPLANT
PLATE RECON 12 H 3.5 (Plate) ×1 IMPLANT
SCREW CANC 2.5XFT 16X4XST SM (Screw) IMPLANT
SCREW CANC 4.0X16 (Screw) ×2 IMPLANT
SCREW CANCELLOUS 4.0X18 (Screw) ×1 IMPLANT
SCREW CANCELLOUS FT 4.0X14 (Screw) ×1 IMPLANT
SCREW CORTICAL 3.5 16MM (Screw) ×2 IMPLANT
SCREW CORTICAL 3.5 18MM (Screw) ×1 IMPLANT
SCREW CORTICAL 3.5X14 (Screw) ×5 IMPLANT
SLEEVE SURGEON STRL (DRAPES) ×1 IMPLANT
SPLINT FIBERGLASS 4X30 (CAST SUPPLIES) ×2 IMPLANT
SPONGE LAP 4X18 X RAY DECT (DISPOSABLE) ×3 IMPLANT
STAPLER VISISTAT 35W (STAPLE) ×1 IMPLANT
SUCTION FRAZIER HANDLE 10FR (MISCELLANEOUS) ×1
SUCTION TUBE FRAZIER 10FR DISP (MISCELLANEOUS) ×1 IMPLANT
SUT ETHILON 4 0 PS 2 18 (SUTURE) IMPLANT
SUT VIC AB 0 CTB1 27 (SUTURE) ×1 IMPLANT
SUT VIC AB 2-0 FS1 27 (SUTURE) ×1 IMPLANT
SYR CONTROL 10ML LL (SYRINGE) IMPLANT
SYSTEM FIXATN ANKL SYNDESMOSIS (Ankle) IMPLANT
TOWEL OR 17X24 6PK STRL BLUE (TOWEL DISPOSABLE) ×1 IMPLANT
TOWEL OR 17X26 10 PK STRL BLUE (TOWEL DISPOSABLE) ×2 IMPLANT
TUBE CONNECTING 12X1/4 (SUCTIONS) ×2 IMPLANT
WATER STERILE IRR 1000ML POUR (IV SOLUTION) ×1 IMPLANT
YANKAUER SUCT BULB TIP NO VENT (SUCTIONS) ×1 IMPLANT

## 2017-07-30 NOTE — Transfer of Care (Signed)
Immediate Anesthesia Transfer of Care Note  Patient: [name]  Procedure(s) Performed: LEFT OPEN REDUCTION INTERNAL FIXATION (ORIF) ANKLE FRACTURE WITH SYDESMOTIC FIXATION (Left Ankle)  Patient Location: PACU  Anesthesia Type:General  Level of Consciousness: awake, alert  and oriented  Airway & Oxygen Therapy: Patient Spontanous Breathing and Patient connected to nasal cannula oxygen  Post-op Assessment: Report given to RN, Post -op Vital signs reviewed and stable and Patient moving all extremities  Post vital signs: Reviewed and stable  Last Vitals:  Vitals:   07/30/17 1050 07/30/17 1057  BP: (!) 152/84 (!) 154/100  Pulse: 79   Resp: 20 20  Temp:    SpO2: 100%     Last Pain:  Vitals:   07/30/17 0933  TempSrc: Oral      Patients Stated Pain Goal: 3 (17/40/81 4481)  Complications: No apparent anesthesia complications pt verbalized comfort with popliteal block

## 2017-07-30 NOTE — Anesthesia Preprocedure Evaluation (Signed)
Anesthesia Evaluation  Patient identified by MRN, date of birth, ID band Patient awake    Reviewed: Allergy & Precautions, H&P , Patient's Chart, lab work & pertinent test results, reviewed documented beta blocker date and time   Airway Mallampati: II  TM Distance: >3 FB Neck ROM: full    Dental no notable dental hx.    Pulmonary sleep apnea and Continuous Positive Airway Pressure Ventilation , former smoker,    Pulmonary exam normal breath sounds clear to auscultation       Cardiovascular hypertension,  Rhythm:regular Rate:Normal     Neuro/Psych    GI/Hepatic   Endo/Other  Morbid obesity  Renal/GU      Musculoskeletal   Abdominal   Peds  Hematology   Anesthesia Other Findings  The left ventricular ejection fraction is normal (55-65%).  Nuclear stress EF: 64%. No wall motion abnormalities  There was no ST segment deviation noted during stress. Bowel loop artifact noted during stress.  This is a low risk study. No ischemia identified.    Reproductive/Obstetrics                             Anesthesia Physical Anesthesia Plan  ASA: III  Anesthesia Plan: General   Post-op Pain Management:  Regional for Post-op pain   Induction: Intravenous  PONV Risk Score and Plan: 2 and Treatment may vary due to age or medical condition, Dexamethasone and Ondansetron  Airway Management Planned: LMA  Additional Equipment:   Intra-op Plan:   Post-operative Plan:   Informed Consent: I have reviewed the patients History and Physical, chart, labs and discussed the procedure including the risks, benefits and alternatives for the proposed anesthesia with the patient or authorized representative who has indicated his/her understanding and acceptance.   Dental Advisory Given  Plan Discussed with: CRNA and Surgeon  Anesthesia Plan Comments: ( )        Anesthesia Quick Evaluation

## 2017-07-30 NOTE — Anesthesia Procedure Notes (Signed)
Procedure Name: LMA Insertion Date/Time: 07/30/2017 12:36 PM Performed by: Lieutenant Diego, CRNA Pre-anesthesia Checklist: Patient identified, Emergency Drugs available, Suction available and Patient being monitored Patient Re-evaluated:Patient Re-evaluated prior to induction Oxygen Delivery Method: Circle system utilized Preoxygenation: Pre-oxygenation with 100% oxygen Induction Type: IV induction Ventilation: Mask ventilation without difficulty LMA: LMA inserted LMA Size: 4.0 Number of attempts: 1 Airway Equipment and Method: Bite block Placement Confirmation: positive ETCO2 and breath sounds checked- equal and bilateral Tube secured with: Tape Dental Injury: Teeth and Oropharynx as per pre-operative assessment

## 2017-07-30 NOTE — Anesthesia Procedure Notes (Signed)
Anesthesia Regional Block: Popliteal block   Pre-Anesthetic Checklist: ,, timeout performed, Correct Patient, Correct Site, Correct Laterality, Correct Procedure, Correct Position, site marked, Risks and benefits discussed, pre-op evaluation,  At surgeon's request and post-op pain management  Laterality: Left  Prep: chloraprep       Needles:   Needle Type: Echogenic Needle     Needle Length: 9cm  Needle Gauge: 21     Additional Needles:   Procedures:, nerve stimulator,,, ultrasound used (permanent image in chart),,,,   Nerve Stimulator or Paresthesia:  Response: great toe, 0.4 mA,   Additional Responses:   Narrative:  Start time: 07/30/2017 10:50 AM End time: 07/30/2017 10:55 AM Injection made incrementally with aspirations every 5 mL.  Performed by: Personally  Anesthesiologist: Lyndle Herrlich, MD

## 2017-07-30 NOTE — Op Note (Signed)
NAME:  Owens, Courtney                ACCOUNT NO.:  MEDICAL RECORD NO.:  [id number]  LOCATION:                                 FACILITY:  PHYSICIAN:  Alta Corning, M.D.        DATE OF BIRTH:  DATE OF PROCEDURE:  07/30/2017 DATE OF DISCHARGE:                              OPERATIVE REPORT   PREOPERATIVE DIAGNOSES: 1. Distal fibula fracture, left with mortise widening. 2. Multiple hypertrophic overgrown toenails.  POSTOPERATIVE DIAGNOSES: 1. Distal fibula fracture, left with mortise widening. 2. Multiple hypertrophic overgrown toenails.  PROCEDURE: 1. Open reduction internal fixation of lateral malleolus fracture with     a 12-hole pelvic reconstruction plate. 2. Open reduction internal fixation of distal syndesmosis with 2     TightRope implants. 3. Debridement of 10 hypertrophic overgrown toenails.  SURGEON:  Alta Corning, M.D.  ASSISTModena Slater.  ANESTHESIA:  General.  BRIEF HISTORY:  Ms. Regner is a 77 year old female with a long history of significant complaints of left ankle pain after a fall.  She presented to our office with x-rays showing distal fibular fracture with significant mortise widening.  We talked about treatment options.  We felt even at her age, she was not going to get a good result without open reduction internal fixation.  There was significant social issues with difficulty taking care of her and ultimately, she had to be admitted to a nursing home.  We are bringing her into the operating room for open reduction internal fixation.  At the prep and drape, we realized that she had multiple hypertrophic overgrown toenails and felt that it would be very important to get this addressed at the time of surgery as well.  DESCRIPTION OF PROCEDURE:  The patient was brought to the operating room and after adequate anesthesia was obtained with general anesthetic, the patient was placed supine on the operating table, and the left leg was then prepped  and draped in usual sterile fashion.  Following this, the leg was exsanguinated.  Blood pressure tourniquet was inflated to 300 mmHg.  Attention was turned to the lateral side.  An incision was made. She had to go very deep relative to her large size, and we dissected down to the level of the fracture.  The fracture unfortunately was a coronal split.  At that point, we knew we would not able to get interfragmentary fixation and still lay a plate down, so at that point, what we knew was that we needed length and fixation of the syndesmosis and so we basically did a manipulative closed reduction with a tenaculum and then weaved a 12-hole plate.  Given that her bone quality was so poor, I felt would need a lot of fixation proximally and a pelvic recon plate had been opened and we did a manipulation of the place so it would fit the bone.  Once this was applied proximally and distally, there still was some gap, although we were able to maintain the length alignment.  Once we had the length alignment set and the overall alignment, set at that point we put through the pelvic reconstruction plate.  We passed some K-wires parallel to the  joint surface and then over drilled this with a cannulated drill and then passed 2 TightRopes through the plate and through the far side of the tibia carefully, and under x-ray guidance, we were able to get these flip back against the bone and we were fairly certain that we were on to the periosteum at that point.  These were toggled and then tightened and then tied and then cut and then our final x-rays were taken showing that we had excellent fixation of the syndesmosis.  We had excellent repair of her fibular fracture.  At this point, the wounds were irrigated thoroughly and then closed in layers.  Once the wound was closed, her stockinette was removed, and we then took a bone cutter and debrided the 5 toes on her left foot.  I then removed the stocking from her  right foot.  Along the whole, she had just severely hypertrophic overgrown nails there.  We used a bone cutter and cut all 5 of the toenails on her right foot as well.  The patient had been previously placed into a posterior and U splint to provide compression and positional hold, and at this point, she was taken to the recovery room, where she was noted be in satisfactory condition.  Estimated blood loss for the procedure was minimal and the total tourniquet time was approximately 90 minutes, but it can be gotten from her anesthetic record.     Alta Corning, M.D.     Corliss Skains  D:  07/30/2017  T:  07/30/2017  Job:  [id number]  cc:   Alta Corning, M.D.

## 2017-07-30 NOTE — Brief Op Note (Signed)
07/30/2017  2:43 PM  PATIENT:  [name]  77 y.o. female  PRE-OPERATIVE DIAGNOSIS:  LEFT ANKLE FRACTURE  POST-OPERATIVE DIAGNOSIS:  LEFT ANKLE FRACTURE  PROCEDURE:  Procedure(s): LEFT OPEN REDUCTION INTERNAL FIXATION (ORIF) ANKLE FRACTURE WITH SYDESMOTIC FIXATION (Left)  SURGEON:  Surgeon(s) and Role:    Dorna Leitz, MD - Primary  PHYSICIAN ASSISTANT:   ASSISTANTS: bethune   ANESTHESIA:   general  EBL:  25 mL   BLOOD ADMINISTERED:none  DRAINS: none   LOCAL MEDICATIONS USED:  MARCAINE     SPECIMEN:  No Specimen  DISPOSITION OF SPECIMEN:  PATHOLOGY  COUNTS:  YES  TOURNIQUET:  * Missing tourniquet times found for documented tourniquets in log: [id number] *  DICTATION: .Other Dictation: Dictation Number [id number]  PLAN OF CARE: Admit to inpatient   PATIENT DISPOSITION:  PACU - hemodynamically stable.   Delay start of Pharmacological VTE agent (>24hrs) due to surgical blood loss or risk of bleeding: no

## 2017-07-30 NOTE — H&P (Signed)
PREOPERATIVE H&P  Chief Complaint: l ankle pain  HPI: [name] is a 77 y.o. female who presents for evaluation of l ankle pain . It has been present for 5 days and has been worsening. She has failed conservative measures. Pain is rated as severe.  Past Medical History:  Diagnosis Date  . Allergic rhinitis   . Ankle fracture    Left, Mortise Widening  . Arthritis    "knuckles" (06/17/2012)  . 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 Breast cancer ([place])    "right" (06/17/2012)  . Cataracts, bilateral   . Exertional dyspnea   . Hypertension   . Iron deficiency anemia    "hematologist watches it" (06/17/2012)  . Obesity   . OSA treated with BiPAP 03/08/2016  . Osteoarthritis    severe right hip osteoarthritis-s/p hip replacement  . Osteopenia 11/26/2009  . Thyroid disorder    "sees endocrinologist yearly" (06/17/2012)   Past Surgical History:  Procedure Laterality Date  . BREAST BIOPSY  ` 1992   "bilaterlly" (06/17/2012)  . EYE SURGERY  2011   cataract removal both eyes  . FRACTURE SURGERY Right 01/01/14   Has pins. Procedure performed at [facility]  07/26/2012   Procedure: CLOSED MANIPULATION HIP;  Surgeon: Nita Sells, MD;  Location: [facility];  Service: Orthopedics;  Laterality: Right;  . MASTECTOMY  1993?   bilateral mastectomy  . TONSILLECTOMY  1949  . TOTAL HIP ARTHROPLASTY  04/2006   right hip   . TOTAL HIP REVISION  06/17/2012   "right" (06/17/2012)  . TOTAL HIP REVISION  06/17/2012   Procedure: TOTAL HIP REVISION;  Surgeon: Kerin Salen, MD;  Location: [facility];  Service: Orthopedics;  Laterality: Right;   Social History   Socioeconomic History  . Marital status: Widowed    Spouse name: None  . Number of children: None  . Years of education: None  . Highest education level: None  Social Needs  . Financial resource strain: None  . Food insecurity - worry: None  . Food insecurity - inability: None  . Transportation  needs - medical: None  . Transportation needs - non-medical: None  Occupational History  . None  Tobacco Use  . Smoking status: Former Smoker    Packs/day: 0.50    Years: 10.00    Pack years: 5.00    Types: Cigarettes    Last attempt to quit: 07/14/1974    Years since quitting: 43.0  . Smokeless tobacco: Never Used  . Tobacco comment: quit in 1976 smoked for approx. 10 years  Substance and Sexual Activity  . Alcohol use: Yes    Alcohol/week: 4.2 oz    Types: 7 Glasses of wine per week    Comment: drinks wine with dinner  . Drug use: No  . Sexual activity: No  Other Topics Concern  . None  Social History Narrative   Retired   The patient is married and has one child and two grandchildren that live in the area.     She drinks wine with dinner.  She quit smoking in 1976, smoked for   approximately 10 years.      Moved to [place] from [place], [place]         Family History  Problem Relation Age of Onset  . Heart failure Father        deceased age 65  . Diabetes Father   . Alcohol abuse Father   . Breast cancer Mother  deceased at age 11 secondary to breast cancer  . Liver disease Sister        Liver failure--deceased  . Dementia Maternal Grandmother   . Colon cancer Neg Hx   . Esophageal cancer Neg Hx   . Rectal cancer Neg Hx   . Stomach cancer Neg Hx    Allergies  Allergen Reactions  . Amlodipine     Swelling   . Sulfonamide Derivatives Swelling    REACTION: Swelling of the face; "eyes swelled shut"  . Chocolate Other (See Comments)    Sinus problems   Prior to Admission medications   Medication Sig Start Date End Date Taking? Authorizing Provider  acetaminophen (TYLENOL) 500 MG tablet Take 1,000 mg by mouth 3 (three) times daily as needed for moderate pain or headache.   Yes [provider]  bisoprolol (ZEBETA) 10 MG tablet TAKE 2 TABLETS EVERY DAY Patient taking differently: Take 10 mg by mouth twice daily 07/19/17  Yes Debbrah Alar, NP   Cholecalciferol (VITAMIN D) 2000 units CAPS Take 4,000 Units by mouth daily.    Yes [provider]  cloNIDine (CATAPRES) 0.1 MG tablet TAKE 1 TABLET TWICE DAILY 04/18/17  Yes Debbrah Alar, NP  fexofenadine (ALLEGRA) 180 MG tablet Take 180 mg by mouth daily as needed. For seasonal allergies   Yes [provider]  fluticasone (FLONASE) 50 MCG/ACT nasal spray USE 1 SPRAY IN EACH NOSTRIL EVERY DAY Patient taking differently: Use 1 spray in each nostril once daily as needed for congestion 04/18/17  Yes Debbrah Alar, NP  furosemide (LASIX) 20 MG tablet TAKE 1 TABLET EVERY MORNING AND 1 TABLET AFTER  3PM, MAY TAKE 1 TABLET EVERY EVENING AS NEEDED FOR  SWELLING Patient taking differently: Take 20 mg by mouth twice daily 04/19/17  Yes Debbrah Alar, NP  HYDROcodone-acetaminophen (NORCO/VICODIN) 5-325 MG tablet Take 1 tablet by mouth 3 (three) times daily as needed for moderate pain.   Yes [provider]  losartan (COZAAR) 100 MG tablet TAKE 1 TABLET EVERY DAY 04/18/17  Yes Debbrah Alar, NP  Omega-3 Fatty Acids (FISH OIL) 1000 MG CAPS Take 1,000 mg by mouth daily.   Yes [provider]  omeprazole (PRILOSEC) 40 MG capsule TAKE 1 CAPSULE (40 MG TOTAL) EVERY DAY Patient taking differently: Take 40 mg by mouth once daily as needed for heartburn 04/18/17  Yes Debbrah Alar, NP  Polyethyl Glycol-Propyl Glycol (SYSTANE OP) Apply 1 drop to eye daily as needed (dry eyes).   Yes [provider]  Potassium Chloride ER 20 MEQ TBCR Take 1 tablet by mouth daily. 12/05/16  Yes Debbrah Alar, NP  simvastatin (ZOCOR) 10 MG tablet TAKE 1 TABLET EVERY DAY Patient taking differently: Take 10 mg by mouth every evening 04/30/17  Yes Debbrah Alar, NP     Positive ROS: none  All other systems have been reviewed and were otherwise negative with the exception of those mentioned in the HPI and as above.  Physical Exam: Vitals:    07/30/17 1050 07/30/17 1057  BP: (!) 152/84 (!) 154/100  Pulse: 79   Resp: 20 20  Temp:    SpO2: 100%     General: Alert, no acute distress Cardiovascular: No pedal edema Respiratory: No cyanosis, no use of accessory musculature GI: No organomegaly, abdomen is soft and non-tender Skin: No lesions in the area of chief complaint Neurologic: Sensation intact distally Psychiatric: Patient is competent for consent with normal mood and affect Lymphatic: No axillary or cervical lymphadenopathy  MUSCULOSKELETAL:  l ankle--Mod sts and pain with rom  Xray displaced ankle fracture with Mortise widening  Assessment/Plan: LEFT ANKLE FRACTURE Plan for Procedure(s): OPEN REDUCTION INTERNAL FIXATION (ORIF) ANKLE FRACTURE WITH SYDESMOTIC FIXATION  The risks benefits and alternatives were discussed with the patient including but not limited to the risks of nonoperative treatment, versus surgical intervention including infection, bleeding, nerve injury, malunion, nonunion, hardware prominence, hardware failure, need for hardware removal, blood clots, cardiopulmonary complications, morbidity, mortality, among others, and they were willing to proceed.  Predicted outcome is good, although there will be at least a six to nine month expected recovery.  Alta Corning, MD 07/30/2017 12:22 PM

## 2017-07-31 ENCOUNTER — Encounter (HOSPITAL_COMMUNITY): Payer: Self-pay | Admitting: Orthopedic Surgery

## 2017-07-31 NOTE — Evaluation (Signed)
Physical Therapy Evaluation Patient Details Name: [name] MRN: [id number] DOB: [birth year] Today's Date: 07/31/2017   History of Present Illness  Admitted for OPEN REDUCTION INTERNAL FIXATION (ORIF) ANKLE FRACTURE WITH SYDESMOTIC FIXATION after fall with L ankle fracture and mortise widening;  has a past medical history of Allergic rhinitis, Ankle fracture, Arthritis, Borderline diabetes mellitus (03/06/2011), Breast cancer ([place]), Cataracts, bilateral, Exertional dyspnea, Hypertension, Iron deficiency anemia, Obesity, OSA treated with BiPAP (03/08/2016), Osteoarthritis, Osteopenia (11/26/2009), and Thyroid disorder.  Clinical Impression   Patient is s/p above surgery resulting in functional limitations due to the deficits listed below (see PT Problem List). Had been managing preop with assist at home, and with a brief rehab stay at [facility]; Presents with functional dependencies, difficulty with transfers due to NWB status LLE; Good rise to stand, once up, difficulty maintianing NWB LLE;  Patient will benefit from skilled PT to increase their independence and safety with mobility to allow discharge to the venue listed below.       Follow Up Recommendations SNF    Equipment Recommendations  Wheelchair (measurements PT);Wheelchair cushion (measurements PT)(drop-arm BSC; sliding board)    Recommendations for Other Services       Precautions / Restrictions Precautions Precautions: Other (comment);Fall(NWB L Ankle/LE) Restrictions Weight Bearing Restrictions: Yes LLE Weight Bearing: Non weight bearing      Mobility  Bed Mobility Overal bed mobility: Needs Assistance Bed Mobility: Supine to Sit     Supine to sit: Min guard     General bed mobility comments: Used bed rails, minguard for safety; pulled to sit well  Transfers Overall transfer level: Needs assistance Equipment used: Rolling walker (2 wheeled)(sliding board) Transfers: Lateral/Scoot Transfers;Sit to/from  [facility] Sit to Stand: +2 safety/equipment;Min assist Stand pivot transfers: +2 safety/equipment;Min assist      Lateral/Scoot Transfers: Min assist;With slide board General transfer comment: Performed lateral scoot transfer with sliding board first; very good weight shift/hip hike for placement, cues for technique; excellent use of UEs for lateral scooting; sit to stand with min assist for safety; dependent on push up with UEs, L foot touching down; Tends to sit without reaching back for UE support despite cues; Basic pivot transfer with RW; tending for L foot to touch floor; concern that she may be putting too much weight on it  Ambulation/Gait                Stairs            Wheelchair Mobility    Modified Rankin (Stroke Patients Only)       Balance                                             Pertinent Vitals/Pain Pain Assessment: Faces Faces Pain Scale: Hurts a little bit Pain Location: L ankle; reports minimal pain Pain Descriptors / Indicators: Grimacing Pain Intervention(s): Monitored during session(elevated extremity)    Home Living Family/patient expects to be discharged to:: Skilled nursing facility Living Arrangements: Alone   Type of Home: House       Home Layout: One level Home Equipment: Cane - single point;Adaptive equipment;Grab bars - toilet;Other (comment)(slide board) Additional Comments: pt plans to d/c to SNF for short term rehab    Prior Function Level of Independence: Independent         Comments: pt used cane     Hand Dominance  Dominant Hand: Right    Extremity/Trunk Assessment   Upper Extremity Assessment Upper Extremity Assessment: Defer to OT evaluation    Lower Extremity Assessment Lower Extremity Assessment: Generalized weakness;LLE deficits/detail LLE Deficits / Details: Ankle immobilized; able to actively wigle toes; sensation intact to light touch; weak hip/knee,  required UE assist to lift LLE off of bed       Communication   Communication: No difficulties  Cognition Arousal/Alertness: Awake/alert Behavior During Therapy: WFL for tasks assessed/performed Overall Cognitive Status: Within Functional Limits for tasks assessed                                        General Comments      Exercises     Assessment/Plan    PT Assessment Patient needs continued PT services  PT Problem List Decreased strength;Decreased range of motion;Decreased activity tolerance;Decreased balance;Decreased mobility;Decreased knowledge of use of DME;Decreased safety awareness;Decreased knowledge of precautions;Obesity;Pain       PT Treatment Interventions DME instruction;Gait training;Functional mobility training;Therapeutic activities;Therapeutic exercise;Balance training;Patient/family education;Wheelchair mobility training    PT Goals (Current goals can be found in the Care Plan section)  Acute Rehab PT Goals Patient Stated Goal: Recover PT Goal Formulation: With patient Time For Goal Achievement: 08/14/17 Potential to Achieve Goals: Good    Frequency Min 2X/week   Barriers to discharge        Co-evaluation               AM-PAC PT "6 Clicks" Daily Activity  Outcome Measure Difficulty turning over in bed (including adjusting bedclothes, sheets and blankets)?: A Little Difficulty moving from lying on back to sitting on the side of the bed? : A Little Difficulty sitting down on and standing up from a chair with arms (e.g., wheelchair, bedside commode, etc,.)?: A Lot Help needed moving to and from a bed to chair (including a wheelchair)?: A Little Help needed walking in hospital room?: Total Help needed climbing 3-5 steps with a railing? : Total 6 Click Score: 13    End of Session Equipment Utilized During Treatment: Gait belt(Sliding board) Activity Tolerance: Patient tolerated treatment well Patient left: in chair;with call  bell/phone within reach;with family/visitor present Nurse Communication: Mobility status PT Visit Diagnosis: Other abnormalities of gait and mobility (R26.89);History of falling (Z91.81)    Time: 1610-9604 PT Time Calculation (min) (ACUTE ONLY): 35 min   Charges:   PT Evaluation $PT Eval Moderate Complexity: 1 Mod     PT G Codes:        Roney Marion, PT  Acute Rehabilitation Services Pager [phone] Office [phone]   Colletta [place] 07/31/2017, 1:14 PM

## 2017-07-31 NOTE — Evaluation (Signed)
Occupational Therapy Evaluation Patient Details Name: [name] MRN: [id number] DOB: 08-Oct-1940 Today's Date: 07/31/2017    History of Present Illness Admitted for OPEN REDUCTION INTERNAL FIXATION (ORIF) ANKLE FRACTURE WITH SYDESMOTIC FIXATION after fall with L ankle fracture and mortise widening;  has a past medical history of Allergic rhinitis, Ankle fracture, Arthritis, Borderline diabetes mellitus (03/06/2011), Breast cancer ([place]), Cataracts, bilateral, Exertional dyspnea, Hypertension, Iron deficiency anemia, Obesity, OSA treated with BiPAP (03/08/2016), Osteoarthritis, Osteopenia (11/26/2009), and Thyroid disorder.   Clinical Impression   Pt with decline in function and safety with ADLs and ADL mobility with decreased strength, balance and endurance. Pt is NWB L LE. Pt previously at [facility] fot rehab and was awaiting back surgery before she fell with ankle fx. Pt would benefit from skilled OT services to address impairments to maximize level of function and safety    Follow Up Recommendations  SNF    Equipment Recommendations  Other (comment)(TBD at next venue of care)    Recommendations for Other Services       Precautions / Restrictions Precautions Precautions: Other (comment);Fall(NWB L LE/ankle) Restrictions Weight Bearing Restrictions: Yes LLE Weight Bearing: Non weight bearing      Mobility Bed Mobility Overal bed mobility: Needs Assistance Bed Mobility: Supine to Sit     Supine to sit: Min guard     General bed mobility comments: Used bed rails, minguard for safety; pulled to sit well  Transfers Overall transfer level: Needs assistance Equipment used: Rolling walker (2 wheeled) Transfers: Lateral/Scoot Transfers;Sit to/from [facility] Sit to Stand: +2 safety/equipment;Min assist Stand pivot transfers: +2 safety/equipment;Min assist      Lateral/Scoot Transfers: Min assist;With slide board General transfer comment: Performed  lateral scoot transfer with sliding board first; very good weight shift/hip hike for placement, cues for technique; excellent use of UEs for lateral scooting; sit to stand with min assist for safety; dependent on push up with UEs, L foot touching down; Tends to sit without reaching back for UE support despite cues; Basic pivot transfer with RW; tending for L foot to touch floor; concern that she may be putting too much weight on it    Balance                                           ADL either performed or assessed with clinical judgement   ADL Overall ADL's : Needs assistance/impaired Eating/Feeding: Independent;Sitting   Grooming: Wash/dry hands;Wash/dry face;Min guard;Sitting   Upper Body Bathing: Min guard;Sitting   Lower Body Bathing: Maximal assistance   Upper Body Dressing : Min guard;Sitting   Lower Body Dressing: Total assistance   Toilet Transfer: Minimal assistance;+2 for physical assistance;+2 for safety/equipment Toilet Transfer Details (indicate cue type and reason): used RW SPT to [facility], however pt unable to maintain NWB. Used slide board bed - Midwife and Hygiene: Total assistance       Functional mobility during ADLs: Minimal assistance;+2 for physical assistance;+2 for safety/equipment       Vision Baseline Vision/History: Wears glasses Wears Glasses: Reading only Patient Visual Report: No change from baseline       Perception     Praxis      Pertinent Vitals/Pain Pain Assessment: Faces Faces Pain Scale: Hurts a little bit Pain Location: L ankle; reports minimal pain Pain Descriptors / Indicators: Grimacing Pain Intervention(s): Monitored during session;Repositioned  Hand Dominance Right   Extremity/Trunk Assessment Upper Extremity Assessment Upper Extremity Assessment: Generalized weakness   Lower Extremity Assessment Lower Extremity Assessment: Defer to PT evaluation LLE Deficits /  Details: Ankle immobilized; able to actively wigle toes; sensation intact to light touch; weak hip/knee, required UE assist to lift LLE off of bed       Communication Communication Communication: No difficulties   Cognition Arousal/Alertness: Awake/alert Behavior During Therapy: WFL for tasks assessed/performed Overall Cognitive Status: Within Functional Limits for tasks assessed                                     General Comments       Exercises     Shoulder Instructions      Home Living Family/patient expects to be discharged to:: Skilled nursing facility Living Arrangements: Alone   Type of Home: [place]: One level     Bathroom Shower/Tub: Tub/shower unit;Walk-in shower         Home Equipment: Kasandra Knudsen - single point;Adaptive equipment;Grab bars - toilet;Other (comment)(transfer slide board) Adaptive Equipment: [place] Additional Comments: pt plans to d/c to SNF for short term rehab      Prior Functioning/Environment Level of Independence: Independent        Comments: pt used cane        OT Problem List: Decreased strength;Decreased activity tolerance;Decreased knowledge of use of DME or AE;Impaired balance (sitting and/or standing);Obesity;Pain      OT Treatment/Interventions: Self-care/ADL training;DME and/or AE instruction;Therapeutic activities;Patient/family education    OT Goals(Current goals can be found in the care plan section) Acute Rehab OT Goals Patient Stated Goal: get better OT Goal Formulation: With patient/family Time For Goal Achievement: 08/14/17 Potential to Achieve Goals: Good ADL Goals Pt Will Perform Grooming: with set-up;with supervision;sitting Pt Will Perform Upper Body Bathing: with supervision;with set-up;sitting Pt Will Perform Lower Body Bathing: with mod assist;sitting/lateral leans Pt Will Perform Upper Body Dressing: with supervision;with set-up;sitting Pt Will Transfer to Toilet: with min  assist;with min guard assist;stand pivot transfer;with transfer board;bedside commode Additional ADL Goal #1: Pt will complete be dmobility with sup to sit EOB for ADLs and functional tasks  OT Frequency: Min 2X/week   Barriers to D/C: Decreased caregiver support  pt plans to d/c to a SNF for short term rehab       Co-evaluation              AM-PAC PT "6 Clicks" Daily Activity     Outcome Measure Help from another person eating meals?: None Help from another person taking care of personal grooming?: A Little Help from another person toileting, which includes using toliet, bedpan, or urinal?: Total Help from another person bathing (including washing, rinsing, drying)?: A Lot Help from another person to put on and taking off regular upper body clothing?: A Little Help from another person to put on and taking off regular lower body clothing?: Total 6 Click Score: 14   End of Session Equipment Utilized During Treatment: Gait belt;Rolling walker;Other (comment)(BSC)  Activity Tolerance: Patient tolerated treatment well Patient left: in chair;with call bell/phone within reach;with family/visitor present  OT Visit Diagnosis: Unsteadiness on feet (R26.81);Other abnormalities of gait and mobility (R26.89);History of falling (Z91.81);Muscle weakness (generalized) (M62.81);Pain Pain - Right/Left: Left Pain - part of body: Ankle and joints of foot  Time: 1638-4665 OT Time Calculation (min): 35 min Charges:  OT Evaluation $OT Eval Moderate Complexity: 1 Mod G-Codes: OT G-codes **NOT FOR INPATIENT CLASS** Functional Assessment Tool Used: AM-PAC 6 Clicks Daily Activity     Britt Bottom 07/31/2017, 1:58 PM

## 2017-07-31 NOTE — Progress Notes (Signed)
Patient is concerned with weight and would like to speak with a nutritionist. Educated patient on speaking with PCP at follow up appointment.

## 2017-07-31 NOTE — Anesthesia Postprocedure Evaluation (Signed)
Anesthesia Post Note  Patient: [name]  Procedure(s) Performed: LEFT OPEN REDUCTION INTERNAL FIXATION (ORIF) ANKLE FRACTURE WITH SYDESMOTIC FIXATION (Left Ankle)     Patient location during evaluation: PACU Anesthesia Type: Regional and General Level of consciousness: awake and alert Pain management: pain level controlled Vital Signs Assessment: post-procedure vital signs reviewed and stable Respiratory status: spontaneous breathing, nonlabored ventilation, respiratory function stable and patient connected to nasal cannula oxygen Cardiovascular status: blood pressure returned to baseline and stable Postop Assessment: no apparent nausea or vomiting Anesthetic complications: no    Last Vitals:  Vitals:   07/31/17 0505 07/31/17 0900  BP: 128/63 122/61  Pulse: 77 74  Resp: 16 18  Temp: 36.6 C 36.7 C  SpO2: 99% 100%    Last Pain:  Vitals:   07/31/17 0900  TempSrc: Oral  PainSc: 1                  Czar Ysaguirre

## 2017-07-31 NOTE — Progress Notes (Signed)
Subjective: 1 Day Post-Op Procedure(s) (LRB): LEFT OPEN REDUCTION INTERNAL FIXATION (ORIF) ANKLE FRACTURE WITH SYDESMOTIC FIXATION (Left) Patient reports pain as mild.  Taking by mouth and voiding okay.  She has a poor home situation.  She lives alone.Not out of bed yet.  Objective: Vital signs in last 24 hours: Temp:  [97 F (36.1 C)-98.3 F (36.8 C)] 97.8 F (36.6 C) (01/22 0505) Pulse Rate:  [64-80] 77 (01/22 0505) Resp:  [10-20] 16 (01/22 0505) BP: (128-181)/(60-106) 128/63 (01/22 0505) SpO2:  [97 %-100 %] 99 % (01/22 0505) Weight:  [108.9 kg (240 lb)] 108.9 kg (240 lb) (01/21 0933)  Intake/Output from previous day: 01/21 0701 - 01/22 0700 In: 4818 [P.O.:120; I.V.:1000] Out: 626 [Urine:601; Blood:25] Intake/Output this shift: No intake/output data recorded.  Recent Labs    07/30/17 0939  HGB 11.4*   Recent Labs    07/30/17 0939  WBC 10.8*  RBC 3.49*  HCT 33.9*  PLT 319   Recent Labs    07/30/17 0939  NA 140  K 3.7  CL 110  CO2 19*  BUN 15  CREATININE 1.10*  GLUCOSE 116*  CALCIUM 10.1   No results for input(s): LABPT, INR in the last 72 hours. Left lower extremity exam:Posterior/U-splint intact.  Moves toes actively.  Still some numbness in toes from block.  Minimal swelling in toes.Patient alert and oriented.   Assessment/Plan: 1 Day Post-Op Procedure(s) (LRB): LEFT OPEN REDUCTION INTERNAL FIXATION (ORIF) ANKLE FRACTURE WITH SYDESMOTIC FIXATION (Left)  Plan: Up with physical therapy nonweightbearing on left lower extremity. Aspirin/SCDs for DVT prophylaxis. OT evaluation for ADLs. Social work consult for skilled nursing facility.  Patient interested in [facility].  She hasa poor situation at home.  She lives alone.  This would place heart high riskfor falling due to her inability to ambulate safely. Patient can be discharged to skilled nursing facility as soon as arrangements made/insurance approval given.   [employer] G 07/31/2017, 9:12 AM

## 2017-07-31 NOTE — Clinical Social Work Note (Signed)
Clinical Social Work Assessment  Patient Details  Name: [name] MRN: [id number] Date of Birth: [birth year]  Date of referral:  07/31/17               Reason for consult:  Discharge Planning, Facility Placement                Permission sought to share information with:    Permission granted to share information::     Name::     [name] Rymil  Agency::  [facility]  Relationship::  son   Contact Information:  [phone]  Housing/Transportation Living arrangements for the past 2 months:  [place] of Information:  Patient Patient Interpreter Needed:  None Criminal Activity/Legal Involvement Pertinent to Current Situation/Hospitalization:  No - Comment as needed Significant Relationships:  Adult Children Lives with:  Self Do you feel safe going back to the place where you live?  Yes Need for family participation in patient care:  Yes (Comment)  Care giving concerns:  Patient had adult son present at bedside   Social Worker assessment / plan:  CSW met patent and son at bedside to discuss disposition plan. Patient stated she lives alone at home and even though son is supportive son will be unable to assist at home because son works. Patient stated she is agreeable to discharge to SNF and would prefer [facility]. CSW has made a referral to facility and is waiting for facility to respond back. CSW explained to patient the process of placing her in SNF and patient stated she understands. Patient has regular medicare and will need 3 night stay to be able to qualify. Patients PA is aware  Employment status:  Retired Forensic scientist:  Medicare PT Recommendations:  Not assessed at this time Information / Referral to community resources:  [place]  Patient/Family's Response to care:  Family supportive of patient. Patient stated she is being well taken care of by the staff and is appreciative of everyone's help   Patient/Family's Understanding of and  Emotional Response to Diagnosis, Current Treatment, and Prognosis:  Patient agreeable to discharge to SNF. CSW to follow up with patient when bed is available     Emotional Assessment Appearance:  Appears stated age Attitude/Demeanor/Rapport:  Other Affect (typically observed):  Accepting, Frustrated, Pleasant Orientation:  Oriented to Place, Oriented to  Time, Oriented to Self, Oriented to Situation Alcohol / Substance use:  Not Applicable Psych involvement (Current and /or in the community):  No (Comment)  Discharge Needs  Concerns to be addressed:  No discharge needs identified Readmission within the last 30 days:  No Current discharge risk:  None Barriers to Discharge:  No Barriers Identified   Wende Neighbors, LCSW 07/31/2017, 1:05 PM

## 2017-08-01 ENCOUNTER — Encounter (HOSPITAL_COMMUNITY): Payer: Self-pay | Admitting: General Practice

## 2017-08-01 ENCOUNTER — Ambulatory Visit (HOSPITAL_COMMUNITY): Admission: RE | Admit: 2017-08-01 | Payer: Medicare Other | Source: Ambulatory Visit | Admitting: Orthopedic Surgery

## 2017-08-01 ENCOUNTER — Encounter (HOSPITAL_COMMUNITY): Admission: RE | Payer: Self-pay | Source: Ambulatory Visit

## 2017-08-01 ENCOUNTER — Other Ambulatory Visit: Payer: Self-pay

## 2017-08-01 SURGERY — LUMBAR LAMINECTOMY/DECOMPRESSION MICRODISCECTOMY
Anesthesia: General

## 2017-08-01 NOTE — Progress Notes (Signed)
Subjective: 2 Days Post-Op Procedure(s) (LRB): LEFT OPEN REDUCTION INTERNAL FIXATION (ORIF) ANKLE FRACTURE WITH SYDESMOTIC FIXATION (Left) Patient reports pain as mild.  Up with physical therapy yesterday.  Taking by mouth and voiding without difficulty.  Objective: Vital signs in last 24 hours: Temp:  [97.5 F (36.4 C)-98.1 F (36.7 C)] 97.9 F (36.6 C) (01/23 0441) Pulse Rate:  [66-74] 67 (01/23 0441) Resp:  [14-18] 16 (01/23 0441) BP: (122-168)/(61-85) 168/80 (01/23 0441) SpO2:  [100 %] 100 % (01/23 0441)  Intake/Output from previous day: 01/22 0701 - 01/23 0700 In: 1372.5 [P.O.:240; I.V.:1132.5] Out: -  Intake/Output this shift: No intake/output data recorded.  Recent Labs    07/30/17 0939  HGB 11.4*   Recent Labs    07/30/17 0939  WBC 10.8*  RBC 3.49*  HCT 33.9*  PLT 319   Recent Labs    07/30/17 0939  NA 140  K 3.7  CL 110  CO2 19*  BUN 15  CREATININE 1.10*  GLUCOSE 116*  CALCIUM 10.1   No results for input(s): LABPT, INR in the last 72 hours. Left lower extremity exam: Posterior/U splints intact.  Moves toes actively.  Good sensation in toes. Neurovascular intact Sensation intact distally Incision: dressing C/D/I  Assessment/Plan: 2 Days Post-Op Procedure(s) (LRB): LEFT OPEN REDUCTION INTERNAL FIXATION (ORIF) ANKLE FRACTURE WITH SYDESMOTIC FIXATION (Left) Plan: With physical therapy nonweightbearing on left lower extremity. Discontinue IV. To skilled nursing facility tomorrow. Aspirin with SCDs for DVT prophylaxis.   Kord Monette G 08/01/2017, 7:37 AM

## 2017-08-01 NOTE — Progress Notes (Signed)
   08/01/17 1300  Clinical Encounter Type  Visited With Patient  Visit Type Initial  Referral From Chaplain  Consult/Referral To Chaplain  Spiritual Encounters  Spiritual Needs Emotional  Stress Factors  Patient Stress Factors Exhausted  Family Stress Factors Exhausted   Patient was alone in her room sitting up in her chair and just finished talking over the phone. Patient talked about her concern for her health. She was very appreciative and receptive of my visit. I provided emotional support, reflective listening and compassionate presence.  Haley Roza a Medical sales representative, [employer]

## 2017-08-01 NOTE — NC FL2 (Signed)
[place] LEVEL OF CARE SCREENING TOOL     IDENTIFICATION  Patient Name: [name] Birthdate: May 31, 1941 Sex: female Admission Date (Current Location): 07/30/2017  [facility] and [place] Number:  Herbalist and Address:  [facility] [place], [place] [address] [place], [place] [address]      Provider Number: [id number]  Attending Physician Name and Address:  Dorna Leitz, MD  Relative Name and Phone Number:  Kailia Starry, son, [phone]    Current Level of Care: Hospital Recommended Level of Care: [place] Prior Approval Number:    Date Approved/Denied:   PASRR Number: [id number]  Discharge Plan: SNF    Current Diagnoses: Patient Active Problem List   Diagnosis Date Noted  . Traumatic closed displaced fracture of lateral malleolus of left fibula, initial encounter 07/30/2017  . Low back pain 07/30/2017  . Displaced fracture of distal end of left fibula 07/30/2017  . Acute on chronic diastolic heart failure ([place]) 03/15/2017  . Diastolic heart failure ([place]) 03/08/2016  . OSA (obstructive sleep apnea) 03/08/2016  . Palpitations 10/29/2015  . Snoring 10/29/2015  . Excessive daytime sleepiness 10/29/2015  . PVC's (premature ventricular contractions) 10/29/2015  . SVT (supraventricular tachycardia) ([place]) 10/29/2015  . Onychomycosis 09/23/2015  . Leukocytosis 12/16/2014  . Osteoarthritis of both hands 08/29/2014  . CHF (congestive heart failure) ([place]) 07/14/2014  . Adjustment disorder 06/28/2014  . GERD (gastroesophageal reflux disease) 03/25/2014  . Elevated lipase 06/26/2013  . Cough 05/28/2013  . Morbid obesity due to excess calories ([place]) 03/05/2013  . Dislocation of hip prosthesis ([place]) 07/26/2012  . 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 Hyperglycemia 12/28/2011  . Abdominal pain 12/06/2011  . Hyperparathyroidism ([place]) 10/10/2011  . Overactive bladder 09/06/2011  . Hip pain, right  06/05/2011  . 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 HYPERCALCEMIA 11/29/2009  . Osteopenia 11/26/2009  . ALLERGIC RHINITIS 09/24/2008  . ADENOCARCINOMA, BREAST, HX OF 09/24/2008  . Hyperlipemia 01/24/2008  . ANEMIA 01/24/2008  . Essential hypertension 12/13/2007    Orientation RESPIRATION BLADDER Height & Weight     Self, Time, Situation, Place  Normal Continent Weight: 240 lb (108.9 kg) Height:  5\' 7"  (170.2 cm)  BEHAVIORAL SYMPTOMS/MOOD NEUROLOGICAL BOWEL NUTRITION STATUS      Continent Diet(regular)  AMBULATORY STATUS COMMUNICATION OF NEEDS Skin   Extensive Assist Verbally Surgical wounds                       Personal Care Assistance Level of Assistance  Dressing, Bathing, Feeding Bathing Assistance: Maximum assistance Feeding assistance: Independent Dressing Assistance: Maximum assistance     Functional Limitations Info  Sight, Hearing, Speech Sight Info: Adequate Hearing Info: Adequate Speech Info: Adequate    SPECIAL CARE FACTORS FREQUENCY  PT (By licensed PT), OT (By licensed OT)     PT Frequency: 5x wk OT Frequency: 3x wk            Contractures Contractures Info: Not present    Additional Factors Info  Code Status, Allergies Code Status Info: full code Allergies Info: AMLODIPINE, SULFONAMIDE DERIVATIVES, CHOCOLATE           Current Medications (08/01/2017):  This is the current hospital active medication list Current Facility-Administered Medications  Medication Dose Route Frequency Provider Last Rate Last Dose  . 0.9 %  sodium chloride infusion   Intravenous Continuous Gary Fleet, PA-C 50 mL/hr at 07/31/17 1717    . acetaminophen (TYLENOL) tablet 650 mg  650 mg Oral Q4H PRN Gary Fleet, PA-C   650 mg at 07/31/17 1353   Or  . acetaminophen (TYLENOL) suppository 650 mg  650 mg Rectal Q4H PRN Gary Fleet, PA-C      . aspirin EC tablet 325 mg  325 mg Oral BID PC Gary Fleet, PA-C   325 mg at 08/01/17 8675  . bisoprolol  (ZEBETA) tablet 20 mg  20 mg Oral Daily Gary Fleet, PA-C   20 mg at 08/01/17 4492  . cloNIDine (CATAPRES) tablet 0.1 mg  0.1 mg Oral BID Gary Fleet, PA-C   0.1 mg at 08/01/17 0100  . docusate sodium (COLACE) capsule 100 mg  100 mg Oral BID Gary Fleet, PA-C   100 mg at 08/01/17 7121  . furosemide (LASIX) tablet 20 mg  20 mg Oral BID Gary Fleet, PA-C   20 mg at 08/01/17 9758  . HYDROmorphone (DILAUDID) injection 0.5-1 mg  0.5-1 mg Intravenous Q3H PRN Gary Fleet, PA-C   1 mg at 07/30/17 1532  . losartan (COZAAR) tablet 100 mg  100 mg Oral Daily Gary Fleet, PA-C   100 mg at 08/01/17 8325  . methocarbamol (ROBAXIN) tablet 500 mg  500 mg Oral Q6H PRN Gary Fleet, PA-C   500 mg at 07/31/17 1354   Or  . methocarbamol (ROBAXIN) 500 mg in dextrose 5 % 50 mL IVPB  500 mg Intravenous Q6H PRN Gary Fleet, PA-C      . ondansetron [facility]) tablet 4 mg  4 mg Oral Q6H PRN Gary Fleet, PA-C       Or  . ondansetron [facility]) injection 4 mg  4 mg Intravenous Q6H PRN Gary Fleet, PA-C      . oxyCODONE (Oxy IR/ROXICODONE) immediate release tablet 5-10 mg  5-10 mg Oral Q3H PRN Gary Fleet, PA-C   10 mg at 07/31/17 2127  . pantoprazole (PROTONIX) EC tablet 40 mg  40 mg Oral Daily Gary Fleet, PA-C   40 mg at 08/01/17 4982  . polyethylene glycol (MIRALAX / GLYCOLAX) packet 17 g  17 g Oral Daily PRN Gary Fleet, PA-C      . potassium chloride SA (K-DUR,KLOR-CON) CR tablet 20 mEq  20 mEq Oral Daily Gary Fleet, PA-C   20 mEq at 08/01/17 0925  . simvastatin (ZOCOR) tablet 10 mg  10 mg Oral q1800 Gary Fleet, PA-C   10 mg at 07/31/17 1711     Discharge Medications: Please see discharge summary for a list of discharge medications.  Relevant Imaging Results:  Relevant Lab Results:   Additional Information SS# [id number]  Normajean Baxter, LCSW

## 2017-08-01 NOTE — Social Work (Signed)
CSW met with patient and son at bedside and confirmed [facility] for SNF placement.   CSW contacted admission staff at [facility] to confirm bed offer and advised that patient will dc to SNF tomorrow as she will meet medicare qualifying night stay.  CSW will f/u for disposition.  Elissa Hefty, LCSW Clinical Social Worker [phone]

## 2017-08-02 ENCOUNTER — Encounter: Payer: Self-pay | Admitting: Internal Medicine

## 2017-08-02 DIAGNOSIS — Z5189 Encounter for other specified aftercare: Secondary | ICD-10-CM | POA: Diagnosis not present

## 2017-08-02 DIAGNOSIS — M545 Low back pain: Secondary | ICD-10-CM | POA: Diagnosis not present

## 2017-08-02 DIAGNOSIS — S8262XS Displaced fracture of lateral malleolus of left fibula, sequela: Secondary | ICD-10-CM | POA: Diagnosis not present

## 2017-08-02 DIAGNOSIS — S8262XD Displaced fracture of lateral malleolus of left fibula, subsequent encounter for closed fracture with routine healing: Secondary | ICD-10-CM | POA: Diagnosis not present

## 2017-08-02 DIAGNOSIS — S8263XA Displaced fracture of lateral malleolus of unspecified fibula, initial encounter for closed fracture: Secondary | ICD-10-CM | POA: Diagnosis not present

## 2017-08-02 DIAGNOSIS — L602 Onychogryphosis: Secondary | ICD-10-CM | POA: Diagnosis not present

## 2017-08-02 DIAGNOSIS — R600 Localized edema: Secondary | ICD-10-CM | POA: Diagnosis not present

## 2017-08-02 DIAGNOSIS — I10 Essential (primary) hypertension: Secondary | ICD-10-CM | POA: Diagnosis not present

## 2017-08-02 DIAGNOSIS — S60222A Contusion of left hand, initial encounter: Secondary | ICD-10-CM | POA: Diagnosis not present

## 2017-08-02 DIAGNOSIS — R262 Difficulty in walking, not elsewhere classified: Secondary | ICD-10-CM | POA: Diagnosis not present

## 2017-08-02 DIAGNOSIS — Z9889 Other specified postprocedural states: Secondary | ICD-10-CM | POA: Diagnosis not present

## 2017-08-02 DIAGNOSIS — R278 Other lack of coordination: Secondary | ICD-10-CM | POA: Diagnosis not present

## 2017-08-02 DIAGNOSIS — M6281 Muscle weakness (generalized): Secondary | ICD-10-CM | POA: Diagnosis not present

## 2017-08-02 DIAGNOSIS — G8929 Other chronic pain: Secondary | ICD-10-CM | POA: Diagnosis not present

## 2017-08-02 DIAGNOSIS — G8911 Acute pain due to trauma: Secondary | ICD-10-CM | POA: Diagnosis not present

## 2017-08-02 DIAGNOSIS — M25572 Pain in left ankle and joints of left foot: Secondary | ICD-10-CM | POA: Diagnosis not present

## 2017-08-02 DIAGNOSIS — S32000A Wedge compression fracture of unspecified lumbar vertebra, initial encounter for closed fracture: Secondary | ICD-10-CM | POA: Diagnosis not present

## 2017-08-02 DIAGNOSIS — E669 Obesity, unspecified: Secondary | ICD-10-CM | POA: Diagnosis not present

## 2017-08-02 DIAGNOSIS — Z4789 Encounter for other orthopedic aftercare: Secondary | ICD-10-CM | POA: Diagnosis not present

## 2017-08-02 DIAGNOSIS — R2681 Unsteadiness on feet: Secondary | ICD-10-CM | POA: Diagnosis not present

## 2017-08-02 MED ORDER — ASPIRIN 325 MG PO TBEC: 325.0000 mg | DELAYED_RELEASE_TABLET | Freq: Every day | ORAL | 0 refills | Status: DC

## 2017-08-02 NOTE — Clinical Social Work Placement (Signed)
   CLINICAL SOCIAL WORK PLACEMENT  NOTE  Date:  08/02/2017  Patient Details  Name: [name] MRN: [id number] Date of Birth: 1940-10-23  Clinical Social Work is seeking post-discharge placement for this patient at the [place] level of care (*CSW will initial, date and re-position this form in  chart as items are completed):  Yes   Patient/family provided with [place] Work Department's list of facilities offering this level of care within the geographic area requested by the patient (or if unable, by the patient's family).  Yes   Patient/family informed of their freedom to choose among providers that offer the needed level of care, that participate in Medicare, Medicaid or managed care program needed by the patient, have an available bed and are willing to accept the patient.  Yes   Patient/family informed of [facility]'s ownership interest in [facility] and [facility], as well as of the fact that they are under no obligation to receive care at these facilities.  PASRR submitted to EDS on       PASRR number received on       Existing PASRR number confirmed on 08/01/17     FL2 transmitted to all facilities in geographic area requested by pt/family on       FL2 transmitted to all facilities within larger geographic area on 08/01/17     Patient informed that his/her managed care company has contracts with or will negotiate with certain facilities, including the following:        Yes   Patient/family informed of bed offers received.  Patient chooses bed at [facility]     Physician recommends and patient chooses bed at      Patient to be transferred to [facility] on 08/01/17.  Patient to be transferred to facility by [facility]     Patient family notified on 08/02/17 of transfer.  Name of family member notified:  pt responsible for self     PHYSICIAN       Additional Comment:     _______________________________________________ Normajean Baxter, LCSW 08/02/2017, 9:35 AM

## 2017-08-02 NOTE — Discharge Summary (Signed)
Patient ID: [name] MRN: [id number] DOB/AGE: [birth year] 77 y.o.  Admit date: 07/30/2017 Discharge date: 08/02/2017  Admission Diagnoses:  Principal Problem:   Traumatic closed displaced fracture of lateral malleolus of left fibula, initial encounter Active Problems:   Morbid obesity due to excess calories (HCC)   Low back pain   Displaced fracture of distal end of left fibula   Discharge Diagnoses:  Same  Past Medical History:  Diagnosis Date  . Allergic rhinitis   . Ankle fracture    Left, [place]  . Arthritis    "knuckles" (06/17/2012)  . 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 Breast cancer ([place])    "right" (06/17/2012)  . Cataracts, bilateral   . Exertional dyspnea   . Hypertension   . Iron deficiency anemia    "hematologist watches it" (06/17/2012)  . Obesity   . OSA treated with BiPAP 03/08/2016  . Osteoarthritis    severe right hip osteoarthritis-s/p hip replacement  . Osteopenia 11/26/2009  . Thyroid disorder    "sees endocrinologist yearly" (06/17/2012)    Surgeries: Procedure(s): LEFT OPEN REDUCTION INTERNAL FIXATION (ORIF) ANKLE FRACTURE WITH SYDESMOTIC FIXATION on 07/30/2017  Discharged Condition: Improved  Hospital Course: [name] is an 77 y.o. female who was admitted 07/30/2017 for operative treatment ofTraumatic closed displaced fracture of lateral malleolus of left fibula, initial encounter. Patient has severe unremitting pain that affects sleep, daily activities, and work/hobbies. After pre-op clearance the patient was taken to the operating room on 07/30/2017 and underwent  Procedure(s): LEFT OPEN REDUCTION INTERNAL FIXATION (ORIF) ANKLE FRACTURE WITH SYDESMOTIC FIXATION.    Patient was given perioperative antibiotics:  Anti-infectives (From admission, onward)   Start     Dose/Rate Route Frequency Ordered Stop   07/30/17 1830  ceFAZolin (ANCEF) IVPB 2g/100 mL premix     2 g 200 mL/hr over 30 Minutes Intravenous Every 6 hours  07/30/17 1517 07/31/17 0657   07/30/17 0700  ceFAZolin (ANCEF) 3 g in dextrose 5 % 50 mL IVPB     3 g 130 mL/hr over 30 Minutes Intravenous On call to O.R. 07/27/17 1336 07/30/17 1256       Patient was given sequential compression devices, early ambulation, and chemoprophylaxis to prevent DVT.  Patient benefited maximally from hospital stay and there were no complications.    Recent vital signs:  Patient Vitals for the past 24 hrs:  BP Temp Temp src Pulse Resp SpO2  08/02/17 0630 (!) 155/62 98 F (36.7 C) Oral 72 16 99 %  08/01/17 2100 136/80 98.9 F (37.2 C) Oral 67 16 100 %     Recent laboratory studies:  Recent Labs    07/30/17 0939  WBC 10.8*  HGB 11.4*  HCT 33.9*  PLT 319  NA 140  K 3.7  CL 110  CO2 19*  BUN 15  CREATININE 1.10*  GLUCOSE 116*  CALCIUM 10.1     Discharge Medications:   Allergies as of 08/02/2017      Reactions   Amlodipine    Swelling   Sulfonamide Derivatives Swelling   REACTION: Swelling of the face; "eyes swelled shut"   Chocolate Other (See Comments)   Sinus problems      Medication List    STOP taking these medications   HYDROcodone-acetaminophen 5-325 MG tablet Commonly known as:  NORCO/VICODIN     TAKE these medications   acetaminophen 500 MG tablet Commonly known as:  TYLENOL Take 1,000 mg by mouth 3 (three) times daily as needed for moderate pain  or headache.   aspirin 325 MG EC tablet Take 1 tablet (325 mg total) by mouth daily.   bisoprolol 10 MG tablet Commonly known as:  ZEBETA TAKE 2 TABLETS EVERY DAY What changed:    how much to take  how to take this  when to take this   cloNIDine 0.1 MG tablet Commonly known as:  CATAPRES TAKE 1 TABLET TWICE DAILY   fexofenadine 180 MG tablet Commonly known as:  ALLEGRA Take 180 mg by mouth daily as needed. For seasonal allergies   Fish Oil 1000 MG Caps Take 1,000 mg by mouth daily.   fluticasone 50 MCG/ACT nasal spray Commonly known as:  FLONASE USE 1 SPRAY  IN EACH NOSTRIL EVERY DAY What changed:  See the new instructions.   furosemide 20 MG tablet Commonly known as:  LASIX TAKE 1 TABLET EVERY MORNING AND 1 TABLET AFTER  3PM, MAY TAKE 1 TABLET EVERY EVENING AS NEEDED FOR  SWELLING What changed:  See the new instructions.   losartan 100 MG tablet Commonly known as:  COZAAR TAKE 1 TABLET EVERY DAY   omeprazole 40 MG capsule Commonly known as:  PRILOSEC TAKE 1 CAPSULE (40 MG TOTAL) EVERY DAY What changed:  See the new instructions.   oxyCODONE-acetaminophen 5-325 MG tablet Commonly known as:  PERCOCET/ROXICET Take 1-2 tablets by mouth every 6 (six) hours as needed for severe pain.   Potassium Chloride ER 20 MEQ Tbcr Take 1 tablet by mouth daily.   simvastatin 10 MG tablet Commonly known as:  ZOCOR TAKE 1 TABLET EVERY DAY What changed:    how much to take  how to take this  when to take this   SYSTANE OP Apply 1 drop to eye daily as needed (dry eyes).   Vitamin D 2000 units Caps Take 4,000 Units by mouth daily.       Diagnostic Studies: Dg Ankle Complete Left  Result Date: 07/30/2017 CLINICAL DATA:  Post ORIF of the left ankle EXAM: LEFT ANKLE COMPLETE - 3+ VIEW; DG C-ARM 61-120 MIN COMPARISON:  Left ankle radiographs-07/23/2017 FINDINGS: 3 spot intraoperative fluoroscopic images of the distal tibia and fibula are provided for review Images demonstrate the sequela of sideplate fixation of previously noted minimally displaced distal fibular fracture. Additionally, there is suspected fusion of the distal tib-fib joint with radiolucent fixation material. Alignment appears anatomic. Expected subcutaneous emphysema about the operative site. Re demonstrated scattered dermal calcifications. No radiopaque foreign body. IMPRESSION: Post ORIF of the distal fibula and distal tib-fib joint without evidence of complication. Electronically Signed   By: Sandi Mariscal M.D.   On: 07/30/2017 14:45   Dg C-arm 1-60 Min  Result Date:  07/30/2017 CLINICAL DATA:  Post ORIF of the left ankle EXAM: LEFT ANKLE COMPLETE - 3+ VIEW; DG C-ARM 61-120 MIN COMPARISON:  Left ankle radiographs-07/23/2017 FINDINGS: 3 spot intraoperative fluoroscopic images of the distal tibia and fibula are provided for review Images demonstrate the sequela of sideplate fixation of previously noted minimally displaced distal fibular fracture. Additionally, there is suspected fusion of the distal tib-fib joint with radiolucent fixation material. Alignment appears anatomic. Expected subcutaneous emphysema about the operative site. Re demonstrated scattered dermal calcifications. No radiopaque foreign body. IMPRESSION: Post ORIF of the distal fibula and distal tib-fib joint without evidence of complication. Electronically Signed   By: Sandi Mariscal M.D.   On: 07/30/2017 14:45    Disposition: Skilled nursing facility  Discharge Instructions    Call MD / Call 911   Complete by:  As directed    If you experience chest pain or shortness of breath, CALL 911 and be transported to the hospital emergency room.  If you develope a fever above 101 F, pus (white drainage) or increased drainage or redness at the wound, or calf pain, call your surgeon's office.   Constipation Prevention   Complete by:  As directed    Drink plenty of fluids.  Prune juice may be helpful.  You may use a stool softener, such as Colace (over the counter) 100 mg twice a day.  Use MiraLax (over the counter) for constipation as needed.   Increase activity slowly as tolerated   Complete by:  As directed     Nonweightbearing left lower extremity.   Contact information for follow-up providers    Dorna Leitz, MD. Schedule an appointment as soon as possible for a visit in 2 weeks.   Specialty:  Orthopedic Surgery Contact information: [place] [address] [phone]            Contact information for after-discharge care    Destination    [facility] .   Service:   Skilled Nursing Contact information: [place] [phone]                   Signed: Erlene Senters 08/02/2017, 9:38 AM

## 2017-08-02 NOTE — Progress Notes (Signed)
Subjective: 3 Days Post-Op Procedure(s) (LRB): LEFT OPEN REDUCTION INTERNAL FIXATION (ORIF) ANKLE FRACTURE WITH SYDESMOTIC FIXATION (Left) Patient reports pain as mild. No complaints.   Ready to go to skilled nursing facility.  Objective: Vital signs in last 24 hours: Temp:  [98 F (36.7 C)-98.9 F (37.2 C)] 98 F (36.7 C) (01/24 0630) Pulse Rate:  [67-72] 72 (01/24 0630) Resp:  [16] 16 (01/24 0630) BP: (136-155)/(62-80) 155/62 (01/24 0630) SpO2:  [99 %-100 %] 99 % (01/24 0630)  Intake/Output from previous day: 01/23 0701 - 01/24 0700 In: 480 [P.O.:480] Out: 400 [Urine:400] Intake/Output this shift: No intake/output data recorded.  Recent Labs    07/30/17 0939  HGB 11.4*   Recent Labs    07/30/17 0939  WBC 10.8*  RBC 3.49*  HCT 33.9*  PLT 319   Recent Labs    07/30/17 0939  NA 140  K 3.7  CL 110  CO2 19*  BUN 15  CREATININE 1.10*  GLUCOSE 116*  CALCIUM 10.1   No results for input(s): LABPT, INR in the last 72 hours. Left lower extremity exam: Posterior splint intact.  Moves toes actively.  Good sensation in toes.   Assessment/Plan: 3 Days Post-Op Procedure(s) (LRB): LEFT OPEN REDUCTION INTERNAL FIXATION (ORIF) ANKLE FRACTURE WITH SYDESMOTIC FIXATION (Left)  Plan: Discharged to skilled nursing facility today. Aspirin 325 mg once daily for DVT prophylaxis. Treat times one month. Ambulate nonweightbearing on left lower extremity. Follow-up with Dr. Berenice Primas in 2 weeks.    [place] G 08/02/2017, 9:34 AM

## 2017-08-02 NOTE — Social Work (Addendum)
Clinical Social Worker facilitated patient discharge including contacting patient family and facility to confirm patient discharge plans.  Clinical information faxed to facility and family agreeable with plan.    CSW arranged ambulance transport via [facility] to [facility] at 1:00pm.    RN to call [phone] [facility], Room [phone]) to give report prior to discharge.  Clinical Social Worker will sign off for now as social work intervention is no longer needed. Please consult [place] again if new need arises.  Elissa Hefty, LCSW Clinical Social Worker [phone]

## 2017-08-03 ENCOUNTER — Telehealth: Payer: Self-pay

## 2017-08-03 DIAGNOSIS — Z9889 Other specified postprocedural states: Secondary | ICD-10-CM | POA: Diagnosis not present

## 2017-08-03 DIAGNOSIS — S8263XA Displaced fracture of lateral malleolus of unspecified fibula, initial encounter for closed fracture: Secondary | ICD-10-CM | POA: Diagnosis not present

## 2017-08-03 DIAGNOSIS — I10 Essential (primary) hypertension: Secondary | ICD-10-CM | POA: Diagnosis not present

## 2017-08-03 DIAGNOSIS — E669 Obesity, unspecified: Secondary | ICD-10-CM | POA: Diagnosis not present

## 2017-08-03 NOTE — Telephone Encounter (Signed)
TCM call attempted. No answer left message for patient to return call.

## 2017-08-03 NOTE — Telephone Encounter (Signed)
Patient returned TCM call. States she is in SNF for rehab post hospitalization.

## 2017-08-06 DIAGNOSIS — S32000A Wedge compression fracture of unspecified lumbar vertebra, initial encounter for closed fracture: Secondary | ICD-10-CM | POA: Diagnosis not present

## 2017-08-07 DIAGNOSIS — M545 Low back pain: Secondary | ICD-10-CM | POA: Diagnosis not present

## 2017-08-07 DIAGNOSIS — Z5189 Encounter for other specified aftercare: Secondary | ICD-10-CM | POA: Diagnosis not present

## 2017-08-07 DIAGNOSIS — R2681 Unsteadiness on feet: Secondary | ICD-10-CM | POA: Diagnosis not present

## 2017-08-07 DIAGNOSIS — G8929 Other chronic pain: Secondary | ICD-10-CM | POA: Diagnosis not present

## 2017-08-07 DIAGNOSIS — R600 Localized edema: Secondary | ICD-10-CM | POA: Diagnosis not present

## 2017-08-07 DIAGNOSIS — M25572 Pain in left ankle and joints of left foot: Secondary | ICD-10-CM | POA: Diagnosis not present

## 2017-08-09 DIAGNOSIS — G8929 Other chronic pain: Secondary | ICD-10-CM | POA: Diagnosis not present

## 2017-08-09 DIAGNOSIS — R2681 Unsteadiness on feet: Secondary | ICD-10-CM | POA: Diagnosis not present

## 2017-08-09 DIAGNOSIS — M25572 Pain in left ankle and joints of left foot: Secondary | ICD-10-CM | POA: Diagnosis not present

## 2017-08-09 DIAGNOSIS — Z5189 Encounter for other specified aftercare: Secondary | ICD-10-CM | POA: Diagnosis not present

## 2017-08-09 DIAGNOSIS — M545 Low back pain: Secondary | ICD-10-CM | POA: Diagnosis not present

## 2017-08-09 DIAGNOSIS — R600 Localized edema: Secondary | ICD-10-CM | POA: Diagnosis not present

## 2017-08-13 DIAGNOSIS — S8263XA Displaced fracture of lateral malleolus of unspecified fibula, initial encounter for closed fracture: Secondary | ICD-10-CM | POA: Diagnosis not present

## 2017-08-13 DIAGNOSIS — R2681 Unsteadiness on feet: Secondary | ICD-10-CM | POA: Diagnosis not present

## 2017-08-13 DIAGNOSIS — Z5189 Encounter for other specified aftercare: Secondary | ICD-10-CM | POA: Diagnosis not present

## 2017-08-13 DIAGNOSIS — I10 Essential (primary) hypertension: Secondary | ICD-10-CM | POA: Diagnosis not present

## 2017-08-13 DIAGNOSIS — G8929 Other chronic pain: Secondary | ICD-10-CM | POA: Diagnosis not present

## 2017-08-13 DIAGNOSIS — M25572 Pain in left ankle and joints of left foot: Secondary | ICD-10-CM | POA: Diagnosis not present

## 2017-08-13 DIAGNOSIS — M545 Low back pain: Secondary | ICD-10-CM | POA: Diagnosis not present

## 2017-08-13 DIAGNOSIS — R600 Localized edema: Secondary | ICD-10-CM | POA: Diagnosis not present

## 2017-08-14 DIAGNOSIS — Z5189 Encounter for other specified aftercare: Secondary | ICD-10-CM | POA: Diagnosis not present

## 2017-08-14 DIAGNOSIS — S8262XD Displaced fracture of lateral malleolus of left fibula, subsequent encounter for closed fracture with routine healing: Secondary | ICD-10-CM | POA: Diagnosis not present

## 2017-08-14 DIAGNOSIS — R600 Localized edema: Secondary | ICD-10-CM | POA: Diagnosis not present

## 2017-08-14 DIAGNOSIS — M545 Low back pain: Secondary | ICD-10-CM | POA: Diagnosis not present

## 2017-08-14 DIAGNOSIS — R2681 Unsteadiness on feet: Secondary | ICD-10-CM | POA: Diagnosis not present

## 2017-08-14 DIAGNOSIS — L602 Onychogryphosis: Secondary | ICD-10-CM | POA: Diagnosis not present

## 2017-08-14 DIAGNOSIS — M25572 Pain in left ankle and joints of left foot: Secondary | ICD-10-CM | POA: Diagnosis not present

## 2017-08-14 DIAGNOSIS — G8929 Other chronic pain: Secondary | ICD-10-CM | POA: Diagnosis not present

## 2017-08-16 DIAGNOSIS — G8929 Other chronic pain: Secondary | ICD-10-CM | POA: Diagnosis not present

## 2017-08-16 DIAGNOSIS — M545 Low back pain: Secondary | ICD-10-CM | POA: Diagnosis not present

## 2017-08-16 DIAGNOSIS — M25572 Pain in left ankle and joints of left foot: Secondary | ICD-10-CM | POA: Diagnosis not present

## 2017-08-16 DIAGNOSIS — R600 Localized edema: Secondary | ICD-10-CM | POA: Diagnosis not present

## 2017-08-16 DIAGNOSIS — Z5189 Encounter for other specified aftercare: Secondary | ICD-10-CM | POA: Diagnosis not present

## 2017-08-16 DIAGNOSIS — R2681 Unsteadiness on feet: Secondary | ICD-10-CM | POA: Diagnosis not present

## 2017-08-17 DIAGNOSIS — S60222A Contusion of left hand, initial encounter: Secondary | ICD-10-CM | POA: Diagnosis not present

## 2017-08-20 DIAGNOSIS — R2681 Unsteadiness on feet: Secondary | ICD-10-CM | POA: Diagnosis not present

## 2017-08-20 DIAGNOSIS — Z5189 Encounter for other specified aftercare: Secondary | ICD-10-CM | POA: Diagnosis not present

## 2017-08-20 DIAGNOSIS — R600 Localized edema: Secondary | ICD-10-CM | POA: Diagnosis not present

## 2017-08-20 DIAGNOSIS — M545 Low back pain: Secondary | ICD-10-CM | POA: Diagnosis not present

## 2017-08-20 DIAGNOSIS — G8929 Other chronic pain: Secondary | ICD-10-CM | POA: Diagnosis not present

## 2017-08-20 DIAGNOSIS — M25572 Pain in left ankle and joints of left foot: Secondary | ICD-10-CM | POA: Diagnosis not present

## 2017-08-24 DIAGNOSIS — R600 Localized edema: Secondary | ICD-10-CM | POA: Diagnosis not present

## 2017-08-24 DIAGNOSIS — Z5189 Encounter for other specified aftercare: Secondary | ICD-10-CM | POA: Diagnosis not present

## 2017-08-24 DIAGNOSIS — G8929 Other chronic pain: Secondary | ICD-10-CM | POA: Diagnosis not present

## 2017-08-24 DIAGNOSIS — M545 Low back pain: Secondary | ICD-10-CM | POA: Diagnosis not present

## 2017-08-24 DIAGNOSIS — M25572 Pain in left ankle and joints of left foot: Secondary | ICD-10-CM | POA: Diagnosis not present

## 2017-08-24 DIAGNOSIS — R2681 Unsteadiness on feet: Secondary | ICD-10-CM | POA: Diagnosis not present

## 2017-08-27 DIAGNOSIS — M545 Low back pain: Secondary | ICD-10-CM | POA: Diagnosis not present

## 2017-08-27 DIAGNOSIS — R2681 Unsteadiness on feet: Secondary | ICD-10-CM | POA: Diagnosis not present

## 2017-08-27 DIAGNOSIS — Z5189 Encounter for other specified aftercare: Secondary | ICD-10-CM | POA: Diagnosis not present

## 2017-08-27 DIAGNOSIS — M25572 Pain in left ankle and joints of left foot: Secondary | ICD-10-CM | POA: Diagnosis not present

## 2017-08-27 DIAGNOSIS — G8929 Other chronic pain: Secondary | ICD-10-CM | POA: Diagnosis not present

## 2017-08-27 DIAGNOSIS — R600 Localized edema: Secondary | ICD-10-CM | POA: Diagnosis not present

## 2017-08-28 DIAGNOSIS — M25572 Pain in left ankle and joints of left foot: Secondary | ICD-10-CM | POA: Diagnosis not present

## 2017-08-30 DIAGNOSIS — M25572 Pain in left ankle and joints of left foot: Secondary | ICD-10-CM | POA: Diagnosis not present

## 2017-09-03 DIAGNOSIS — M545 Low back pain: Secondary | ICD-10-CM | POA: Diagnosis not present

## 2017-09-03 DIAGNOSIS — I10 Essential (primary) hypertension: Secondary | ICD-10-CM | POA: Diagnosis not present

## 2017-09-03 DIAGNOSIS — Z9889 Other specified postprocedural states: Secondary | ICD-10-CM | POA: Diagnosis not present

## 2017-09-03 DIAGNOSIS — M25572 Pain in left ankle and joints of left foot: Secondary | ICD-10-CM | POA: Diagnosis not present

## 2017-09-03 DIAGNOSIS — S8263XA Displaced fracture of lateral malleolus of unspecified fibula, initial encounter for closed fracture: Secondary | ICD-10-CM | POA: Diagnosis not present

## 2017-09-11 DIAGNOSIS — M25572 Pain in left ankle and joints of left foot: Secondary | ICD-10-CM | POA: Diagnosis not present

## 2017-09-13 DIAGNOSIS — M25572 Pain in left ankle and joints of left foot: Secondary | ICD-10-CM | POA: Diagnosis not present

## 2017-09-13 DIAGNOSIS — M545 Low back pain: Secondary | ICD-10-CM | POA: Diagnosis not present

## 2017-09-14 DIAGNOSIS — S8263XA Displaced fracture of lateral malleolus of unspecified fibula, initial encounter for closed fracture: Secondary | ICD-10-CM | POA: Diagnosis not present

## 2017-09-14 DIAGNOSIS — I10 Essential (primary) hypertension: Secondary | ICD-10-CM | POA: Diagnosis not present

## 2017-09-14 DIAGNOSIS — Z9889 Other specified postprocedural states: Secondary | ICD-10-CM | POA: Diagnosis not present

## 2017-09-14 DIAGNOSIS — S32000A Wedge compression fracture of unspecified lumbar vertebra, initial encounter for closed fracture: Secondary | ICD-10-CM | POA: Diagnosis not present

## 2017-09-19 DIAGNOSIS — I5033 Acute on chronic diastolic (congestive) heart failure: Secondary | ICD-10-CM | POA: Diagnosis not present

## 2017-09-19 DIAGNOSIS — I110 Hypertensive heart disease with heart failure: Secondary | ICD-10-CM | POA: Diagnosis not present

## 2017-09-19 DIAGNOSIS — M48061 Spinal stenosis, lumbar region without neurogenic claudication: Secondary | ICD-10-CM | POA: Diagnosis not present

## 2017-09-19 DIAGNOSIS — S8262XD Displaced fracture of lateral malleolus of left fibula, subsequent encounter for closed fracture with routine healing: Secondary | ICD-10-CM | POA: Diagnosis not present

## 2017-09-19 DIAGNOSIS — M19042 Primary osteoarthritis, left hand: Secondary | ICD-10-CM | POA: Diagnosis not present

## 2017-09-19 DIAGNOSIS — M19041 Primary osteoarthritis, right hand: Secondary | ICD-10-CM | POA: Diagnosis not present

## 2017-09-21 DIAGNOSIS — S8262XD Displaced fracture of lateral malleolus of left fibula, subsequent encounter for closed fracture with routine healing: Secondary | ICD-10-CM | POA: Diagnosis not present

## 2017-09-21 DIAGNOSIS — I110 Hypertensive heart disease with heart failure: Secondary | ICD-10-CM | POA: Diagnosis not present

## 2017-09-21 DIAGNOSIS — M19041 Primary osteoarthritis, right hand: Secondary | ICD-10-CM | POA: Diagnosis not present

## 2017-09-21 DIAGNOSIS — I5033 Acute on chronic diastolic (congestive) heart failure: Secondary | ICD-10-CM | POA: Diagnosis not present

## 2017-09-21 DIAGNOSIS — M48061 Spinal stenosis, lumbar region without neurogenic claudication: Secondary | ICD-10-CM | POA: Diagnosis not present

## 2017-09-21 DIAGNOSIS — M19042 Primary osteoarthritis, left hand: Secondary | ICD-10-CM | POA: Diagnosis not present

## 2017-09-24 DIAGNOSIS — S8262XD Displaced fracture of lateral malleolus of left fibula, subsequent encounter for closed fracture with routine healing: Secondary | ICD-10-CM | POA: Diagnosis not present

## 2017-09-24 DIAGNOSIS — I5033 Acute on chronic diastolic (congestive) heart failure: Secondary | ICD-10-CM | POA: Diagnosis not present

## 2017-09-24 DIAGNOSIS — M19041 Primary osteoarthritis, right hand: Secondary | ICD-10-CM | POA: Diagnosis not present

## 2017-09-24 DIAGNOSIS — M48061 Spinal stenosis, lumbar region without neurogenic claudication: Secondary | ICD-10-CM | POA: Diagnosis not present

## 2017-09-24 DIAGNOSIS — M19042 Primary osteoarthritis, left hand: Secondary | ICD-10-CM | POA: Diagnosis not present

## 2017-09-24 DIAGNOSIS — I110 Hypertensive heart disease with heart failure: Secondary | ICD-10-CM | POA: Diagnosis not present

## 2017-09-25 DIAGNOSIS — M25572 Pain in left ankle and joints of left foot: Secondary | ICD-10-CM | POA: Diagnosis not present

## 2017-09-26 DIAGNOSIS — M19041 Primary osteoarthritis, right hand: Secondary | ICD-10-CM | POA: Diagnosis not present

## 2017-09-26 DIAGNOSIS — S8262XD Displaced fracture of lateral malleolus of left fibula, subsequent encounter for closed fracture with routine healing: Secondary | ICD-10-CM | POA: Diagnosis not present

## 2017-09-26 DIAGNOSIS — I110 Hypertensive heart disease with heart failure: Secondary | ICD-10-CM | POA: Diagnosis not present

## 2017-09-26 DIAGNOSIS — I5033 Acute on chronic diastolic (congestive) heart failure: Secondary | ICD-10-CM | POA: Diagnosis not present

## 2017-09-26 DIAGNOSIS — M19042 Primary osteoarthritis, left hand: Secondary | ICD-10-CM | POA: Diagnosis not present

## 2017-09-26 DIAGNOSIS — M48061 Spinal stenosis, lumbar region without neurogenic claudication: Secondary | ICD-10-CM | POA: Diagnosis not present

## 2017-09-28 DIAGNOSIS — M19041 Primary osteoarthritis, right hand: Secondary | ICD-10-CM | POA: Diagnosis not present

## 2017-09-28 DIAGNOSIS — M19042 Primary osteoarthritis, left hand: Secondary | ICD-10-CM | POA: Diagnosis not present

## 2017-09-28 DIAGNOSIS — I5033 Acute on chronic diastolic (congestive) heart failure: Secondary | ICD-10-CM | POA: Diagnosis not present

## 2017-09-28 DIAGNOSIS — I110 Hypertensive heart disease with heart failure: Secondary | ICD-10-CM | POA: Diagnosis not present

## 2017-09-28 DIAGNOSIS — S8262XD Displaced fracture of lateral malleolus of left fibula, subsequent encounter for closed fracture with routine healing: Secondary | ICD-10-CM | POA: Diagnosis not present

## 2017-09-28 DIAGNOSIS — M48061 Spinal stenosis, lumbar region without neurogenic claudication: Secondary | ICD-10-CM | POA: Diagnosis not present

## 2017-10-01 DIAGNOSIS — M19042 Primary osteoarthritis, left hand: Secondary | ICD-10-CM | POA: Diagnosis not present

## 2017-10-01 DIAGNOSIS — M48061 Spinal stenosis, lumbar region without neurogenic claudication: Secondary | ICD-10-CM | POA: Diagnosis not present

## 2017-10-01 DIAGNOSIS — M19041 Primary osteoarthritis, right hand: Secondary | ICD-10-CM | POA: Diagnosis not present

## 2017-10-01 DIAGNOSIS — S8262XD Displaced fracture of lateral malleolus of left fibula, subsequent encounter for closed fracture with routine healing: Secondary | ICD-10-CM | POA: Diagnosis not present

## 2017-10-01 DIAGNOSIS — I110 Hypertensive heart disease with heart failure: Secondary | ICD-10-CM | POA: Diagnosis not present

## 2017-10-01 DIAGNOSIS — I5033 Acute on chronic diastolic (congestive) heart failure: Secondary | ICD-10-CM | POA: Diagnosis not present

## 2017-10-03 DIAGNOSIS — I5033 Acute on chronic diastolic (congestive) heart failure: Secondary | ICD-10-CM | POA: Diagnosis not present

## 2017-10-03 DIAGNOSIS — S8262XD Displaced fracture of lateral malleolus of left fibula, subsequent encounter for closed fracture with routine healing: Secondary | ICD-10-CM | POA: Diagnosis not present

## 2017-10-03 DIAGNOSIS — M19042 Primary osteoarthritis, left hand: Secondary | ICD-10-CM | POA: Diagnosis not present

## 2017-10-03 DIAGNOSIS — M48061 Spinal stenosis, lumbar region without neurogenic claudication: Secondary | ICD-10-CM | POA: Diagnosis not present

## 2017-10-03 DIAGNOSIS — I110 Hypertensive heart disease with heart failure: Secondary | ICD-10-CM | POA: Diagnosis not present

## 2017-10-03 DIAGNOSIS — M19041 Primary osteoarthritis, right hand: Secondary | ICD-10-CM | POA: Diagnosis not present

## 2017-10-04 DIAGNOSIS — S8262XD Displaced fracture of lateral malleolus of left fibula, subsequent encounter for closed fracture with routine healing: Secondary | ICD-10-CM | POA: Diagnosis not present

## 2017-10-04 DIAGNOSIS — M19042 Primary osteoarthritis, left hand: Secondary | ICD-10-CM | POA: Diagnosis not present

## 2017-10-04 DIAGNOSIS — M48061 Spinal stenosis, lumbar region without neurogenic claudication: Secondary | ICD-10-CM | POA: Diagnosis not present

## 2017-10-04 DIAGNOSIS — I5033 Acute on chronic diastolic (congestive) heart failure: Secondary | ICD-10-CM | POA: Diagnosis not present

## 2017-10-04 DIAGNOSIS — I110 Hypertensive heart disease with heart failure: Secondary | ICD-10-CM | POA: Diagnosis not present

## 2017-10-04 DIAGNOSIS — M19041 Primary osteoarthritis, right hand: Secondary | ICD-10-CM | POA: Diagnosis not present

## 2017-10-05 DIAGNOSIS — M19042 Primary osteoarthritis, left hand: Secondary | ICD-10-CM | POA: Diagnosis not present

## 2017-10-05 DIAGNOSIS — I5033 Acute on chronic diastolic (congestive) heart failure: Secondary | ICD-10-CM | POA: Diagnosis not present

## 2017-10-05 DIAGNOSIS — M19041 Primary osteoarthritis, right hand: Secondary | ICD-10-CM | POA: Diagnosis not present

## 2017-10-05 DIAGNOSIS — I110 Hypertensive heart disease with heart failure: Secondary | ICD-10-CM | POA: Diagnosis not present

## 2017-10-05 DIAGNOSIS — M48061 Spinal stenosis, lumbar region without neurogenic claudication: Secondary | ICD-10-CM | POA: Diagnosis not present

## 2017-10-05 DIAGNOSIS — S8262XD Displaced fracture of lateral malleolus of left fibula, subsequent encounter for closed fracture with routine healing: Secondary | ICD-10-CM | POA: Diagnosis not present

## 2017-10-08 ENCOUNTER — Other Ambulatory Visit: Payer: Self-pay | Admitting: Family

## 2017-10-08 DIAGNOSIS — M48062 Spinal stenosis, lumbar region with neurogenic claudication: Secondary | ICD-10-CM | POA: Diagnosis not present

## 2017-10-09 ENCOUNTER — Encounter: Payer: Self-pay | Admitting: Family

## 2017-10-09 DIAGNOSIS — M19041 Primary osteoarthritis, right hand: Secondary | ICD-10-CM | POA: Diagnosis not present

## 2017-10-09 DIAGNOSIS — I5033 Acute on chronic diastolic (congestive) heart failure: Secondary | ICD-10-CM | POA: Diagnosis not present

## 2017-10-09 DIAGNOSIS — M19042 Primary osteoarthritis, left hand: Secondary | ICD-10-CM | POA: Diagnosis not present

## 2017-10-09 DIAGNOSIS — I110 Hypertensive heart disease with heart failure: Secondary | ICD-10-CM | POA: Diagnosis not present

## 2017-10-09 DIAGNOSIS — S8262XD Displaced fracture of lateral malleolus of left fibula, subsequent encounter for closed fracture with routine healing: Secondary | ICD-10-CM | POA: Diagnosis not present

## 2017-10-09 DIAGNOSIS — M48061 Spinal stenosis, lumbar region without neurogenic claudication: Secondary | ICD-10-CM | POA: Diagnosis not present

## 2017-10-10 DIAGNOSIS — S8262XD Displaced fracture of lateral malleolus of left fibula, subsequent encounter for closed fracture with routine healing: Secondary | ICD-10-CM | POA: Diagnosis not present

## 2017-10-10 DIAGNOSIS — M19042 Primary osteoarthritis, left hand: Secondary | ICD-10-CM | POA: Diagnosis not present

## 2017-10-10 DIAGNOSIS — I5033 Acute on chronic diastolic (congestive) heart failure: Secondary | ICD-10-CM | POA: Diagnosis not present

## 2017-10-10 DIAGNOSIS — M19041 Primary osteoarthritis, right hand: Secondary | ICD-10-CM | POA: Diagnosis not present

## 2017-10-10 DIAGNOSIS — I110 Hypertensive heart disease with heart failure: Secondary | ICD-10-CM | POA: Diagnosis not present

## 2017-10-10 DIAGNOSIS — M48061 Spinal stenosis, lumbar region without neurogenic claudication: Secondary | ICD-10-CM | POA: Diagnosis not present

## 2017-10-10 MED ORDER — FLUOXETINE HCL 40 MG PO CAPS: 40.0000 mg | ORAL_CAPSULE | Freq: Every day | ORAL | 3 refills | Status: DC

## 2017-10-10 MED ORDER — GABAPENTIN 100 MG PO CAPS: 100.0000 mg | ORAL_CAPSULE | Freq: Three times a day (TID) | ORAL | 3 refills | Status: DC

## 2017-10-11 DIAGNOSIS — M19041 Primary osteoarthritis, right hand: Secondary | ICD-10-CM | POA: Diagnosis not present

## 2017-10-11 DIAGNOSIS — I5033 Acute on chronic diastolic (congestive) heart failure: Secondary | ICD-10-CM | POA: Diagnosis not present

## 2017-10-11 DIAGNOSIS — S8262XD Displaced fracture of lateral malleolus of left fibula, subsequent encounter for closed fracture with routine healing: Secondary | ICD-10-CM | POA: Diagnosis not present

## 2017-10-11 DIAGNOSIS — M19042 Primary osteoarthritis, left hand: Secondary | ICD-10-CM | POA: Diagnosis not present

## 2017-10-11 DIAGNOSIS — I110 Hypertensive heart disease with heart failure: Secondary | ICD-10-CM | POA: Diagnosis not present

## 2017-10-11 DIAGNOSIS — M48061 Spinal stenosis, lumbar region without neurogenic claudication: Secondary | ICD-10-CM | POA: Diagnosis not present

## 2017-10-12 DIAGNOSIS — S8262XD Displaced fracture of lateral malleolus of left fibula, subsequent encounter for closed fracture with routine healing: Secondary | ICD-10-CM | POA: Diagnosis not present

## 2017-10-12 DIAGNOSIS — M48061 Spinal stenosis, lumbar region without neurogenic claudication: Secondary | ICD-10-CM | POA: Diagnosis not present

## 2017-10-12 DIAGNOSIS — I5033 Acute on chronic diastolic (congestive) heart failure: Secondary | ICD-10-CM | POA: Diagnosis not present

## 2017-10-12 DIAGNOSIS — M19041 Primary osteoarthritis, right hand: Secondary | ICD-10-CM | POA: Diagnosis not present

## 2017-10-12 DIAGNOSIS — M19042 Primary osteoarthritis, left hand: Secondary | ICD-10-CM | POA: Diagnosis not present

## 2017-10-12 DIAGNOSIS — I110 Hypertensive heart disease with heart failure: Secondary | ICD-10-CM | POA: Diagnosis not present

## 2017-10-15 DIAGNOSIS — M19041 Primary osteoarthritis, right hand: Secondary | ICD-10-CM | POA: Diagnosis not present

## 2017-10-15 DIAGNOSIS — I110 Hypertensive heart disease with heart failure: Secondary | ICD-10-CM | POA: Diagnosis not present

## 2017-10-15 DIAGNOSIS — M19042 Primary osteoarthritis, left hand: Secondary | ICD-10-CM | POA: Diagnosis not present

## 2017-10-15 DIAGNOSIS — I5033 Acute on chronic diastolic (congestive) heart failure: Secondary | ICD-10-CM | POA: Diagnosis not present

## 2017-10-15 DIAGNOSIS — M48061 Spinal stenosis, lumbar region without neurogenic claudication: Secondary | ICD-10-CM | POA: Diagnosis not present

## 2017-10-15 DIAGNOSIS — S8262XD Displaced fracture of lateral malleolus of left fibula, subsequent encounter for closed fracture with routine healing: Secondary | ICD-10-CM | POA: Diagnosis not present

## 2017-10-17 ENCOUNTER — Other Ambulatory Visit: Payer: Self-pay | Admitting: Orthopedic Surgery

## 2017-10-18 ENCOUNTER — Encounter (HOSPITAL_COMMUNITY)
Admission: RE | Admit: 2017-10-18 | Discharge: 2017-10-18 | Disposition: A | Discharge status: Home / Self Care | Payer: Medicare Other | Source: Ambulatory Visit | Attending: Orthopedic Surgery | Admitting: Orthopedic Surgery

## 2017-10-18 ENCOUNTER — Ambulatory Visit (HOSPITAL_COMMUNITY)
Admission: RE | Admit: 2017-10-18 | Discharge: 2017-10-18 | Disposition: A | Discharge status: Home / Self Care | Payer: Medicare Other | Source: Ambulatory Visit | Attending: Orthopedic Surgery | Admitting: Orthopedic Surgery

## 2017-10-18 ENCOUNTER — Other Ambulatory Visit: Payer: Self-pay

## 2017-10-18 ENCOUNTER — Encounter (HOSPITAL_COMMUNITY): Payer: Self-pay

## 2017-10-18 DIAGNOSIS — Z01818 Encounter for other preprocedural examination: Secondary | ICD-10-CM

## 2017-10-18 DIAGNOSIS — J984 Other disorders of lung: Secondary | ICD-10-CM | POA: Diagnosis not present

## 2017-10-18 DIAGNOSIS — M5135 Other intervertebral disc degeneration, thoracolumbar region: Secondary | ICD-10-CM | POA: Insufficient documentation

## 2017-10-18 DIAGNOSIS — Z01812 Encounter for preprocedural laboratory examination: Secondary | ICD-10-CM | POA: Insufficient documentation

## 2017-10-18 DIAGNOSIS — M4855XA Collapsed vertebra, not elsewhere classified, thoracolumbar region, initial encounter for fracture: Secondary | ICD-10-CM | POA: Insufficient documentation

## 2017-10-18 DIAGNOSIS — M419 Scoliosis, unspecified: Secondary | ICD-10-CM | POA: Insufficient documentation

## 2017-10-18 DIAGNOSIS — Z01810 Encounter for preprocedural cardiovascular examination: Secondary | ICD-10-CM | POA: Diagnosis not present

## 2017-10-18 HISTORY — DX: Dyspnea, unspecified: R06.00

## 2017-10-18 HISTORY — DX: Gastro-esophageal reflux disease without esophagitis: K21.9

## 2017-10-18 LAB — COMPREHENSIVE METABOLIC PANEL
ALBUMIN: 3.9 g/dL (ref 3.5–5.0)
ALT: 19 U/L (ref 14–54)
AST: 28 U/L (ref 15–41)
Alkaline Phosphatase: 80 U/L (ref 38–126)
Anion gap: 10 (ref 5–15)
BUN: 32 mg/dL — AB (ref 6–20)
CHLORIDE: 108 mmol/L (ref 101–111)
CO2: 20 mmol/L — AB (ref 22–32)
Calcium: 10.8 mg/dL — ABNORMAL HIGH (ref 8.9–10.3)
Creatinine, Ser: 1.37 mg/dL — ABNORMAL HIGH (ref 0.44–1.00)
GFR calc Af Amer: 43 mL/min — ABNORMAL LOW (ref >60)
GFR calc non Af Amer: 37 mL/min — ABNORMAL LOW (ref >60)
GLUCOSE: 109 mg/dL — AB (ref 65–99)
Potassium: 4.4 mmol/L (ref 3.5–5.1)
Sodium: 138 mmol/L (ref 135–145)
Total Bilirubin: 0.7 mg/dL (ref 0.3–1.2)
Total Protein: 7.3 g/dL (ref 6.5–8.1)

## 2017-10-18 LAB — URINALYSIS, ROUTINE W REFLEX MICROSCOPIC
Bilirubin Urine: NEGATIVE
Glucose, UA: NEGATIVE mg/dL
Hgb urine dipstick: NEGATIVE
KETONES UR: NEGATIVE mg/dL
LEUKOCYTES UA: NEGATIVE
NITRITE: NEGATIVE
PH: 5 (ref 5.0–8.0)
Protein, ur: NEGATIVE mg/dL
SPECIFIC GRAVITY, URINE: 1.01 (ref 1.005–1.030)

## 2017-10-18 LAB — CBC WITH DIFFERENTIAL/PLATELET
BASOS ABS: 0 10*3/uL (ref 0.0–0.1)
BASOS PCT: 0 %
EOS PCT: 1 %
Eosinophils Absolute: 0.1 10*3/uL (ref 0.0–0.7)
HCT: 32.4 % — ABNORMAL LOW (ref 36.0–46.0)
Hemoglobin: 10.6 g/dL — ABNORMAL LOW (ref 12.0–15.0)
LYMPHS PCT: 21 %
Lymphs Abs: 2.1 10*3/uL (ref 0.7–4.0)
MCH: 31.3 pg (ref 26.0–34.0)
MCHC: 32.7 g/dL (ref 30.0–36.0)
MCV: 95.6 fL (ref 78.0–100.0)
MONO ABS: 0.5 10*3/uL (ref 0.1–1.0)
Monocytes Relative: 5 %
Neutro Abs: 7.3 10*3/uL (ref 1.7–7.7)
Neutrophils Relative %: 73 %
PLATELETS: 297 10*3/uL (ref 150–400)
RBC: 3.39 MIL/uL — AB (ref 3.87–5.11)
RDW: 13.8 % (ref 11.5–15.5)
WBC: 9.9 10*3/uL (ref 4.0–10.5)

## 2017-10-18 LAB — SURGICAL PCR SCREEN
MRSA, PCR: NEGATIVE
Staphylococcus aureus: POSITIVE — AB

## 2017-10-18 LAB — PROTIME-INR
INR: 1
Prothrombin Time: 13.1 seconds (ref 11.4–15.2)

## 2017-10-18 LAB — APTT: APTT: 36 s (ref 24–36)

## 2017-10-18 NOTE — Pre-Procedure Instructions (Addendum)
[name]  10/18/2017   Your procedure is scheduled on  Thursday April 18.  Report to [facility] Admitting at 8:35 AM   Call this number if you have problems the morning of surgery: [phone]               For any other questions prior to surgery Monday - Friday, 8:00 AM - 4:00 PM, call [phone]-           PAT desk, ask to speak to any nurse.   Remember:  Do not eat food or drink liquids after midnight Wednesday, April 17.  Take these medicines the morning of surgery with A SIP OF WATER : bisoprolol (ZEBETA)  FLUoxetine (PROZAC)  gabapentin (NEURONTIN) omeprazole (PRILOSEC) Take if needed: cloNIDine (CATAPRES) fexofenadine (ALLEGRA) fluticasone (FLONASE) oxyCODONE (OXY IR/ROXICODONE)  1 Week prior to surgery STOP taking Aspirin - unless instructed differently by your surgeon, Aspirin Products (Goody Powder, Excedrin Migraine), Ibuprofen (Advil), Naproxen (Aleve), Viiamis and Herbal Products (ie Fish Oil)  Patients discharged the day of surgery will not be allowed to drive home.   Special instructions:  Bring the mask for your CPAP with you to the hospital  Please read over the following fact sheets that you were given: Pain Booklet, Patient Instructions for Mupirocin Application, Incentive Spirometry, Surgical Site Infections  [place]- Preparing For Surgery  Before surgery, you can play an important role. Because skin is not sterile, your skin needs to be as free of germs as possible. You can reduce the number of germs on your skin by washing with CHG (chlorahexidine gluconate) Soap before surgery.  CHG is an antiseptic cleaner which kills germs and bonds with the skin to continue killing germs even after washing.  Please do not use if you have an allergy to CHG or antibacterial soaps. If your skin becomes reddened/irritated stop using the CHG.  Do not shave (including legs and underarms) for at least 48 hours prior to first CHG shower. It is OK  to shave your face.  Please follow these instructions carefully.   1. Shower the NIGHT BEFORE SURGERY and the MORNING OF SURGERY with CHG.   2. If you chose to wash your hair, wash your hair first as usual with your normal shampoo.  3. After you shampoo, rinse your hair and body thoroughly to remove the shampoo.             Wash Face and genitals (private parts)  with your normal soap.  4. Use CHG as you would any other liquid soap. You can apply CHG directly to the skin and wash gently with a scrungie or a clean washcloth.   5. Apply the CHG Soap to your body ONLY FROM THE NECK DOWN.  Do not use on open wounds or open sores. Avoid contact with your eyes, ears, mouth and genitals (private parts). Wash Face and genitals (private parts)  with your normal soap.  6. Wash thoroughly, paying special attention to the area where your surgery will be performed.  7. Thoroughly rinse your body with warm water from the neck down.  8. DO NOT shower/wash with your normal soap after using and rinsing off the CHG Soap.  9. Pat yourself dry with a CLEAN TOWEL.  10. Wear CLEAN PAJAMAS to bed the night before surgery, wear comfortable clothes the morning of surgery  11. Place CLEAN SHEETS on your bed the night of your first shower and DO NOT SLEEP WITH PETS.  Day of Surgery: Shower as above Do not apply any deodorants/lotions, powders or colognes.. Please wear clean clothes to the hospital/surgery center.              Do not wear jewelry, make-up or nail polish.  Do not wear lotions, powders, or perfumes, or deodorant.  Do not shave 48 hours prior to surgery.  Men may shave face and neck.  Do not bring valuables to the hospital.  [facility] is not responsible for any belongings or valuables. Contacts, dentures or bridgework may not be worn into surgery.  Leave your suitcase in the car.  After surgery it may be brought to your room.

## 2017-10-19 ENCOUNTER — Ambulatory Visit: Payer: Medicare Other | Admitting: Family

## 2017-10-19 DIAGNOSIS — M48061 Spinal stenosis, lumbar region without neurogenic claudication: Secondary | ICD-10-CM | POA: Diagnosis not present

## 2017-10-19 DIAGNOSIS — M19041 Primary osteoarthritis, right hand: Secondary | ICD-10-CM | POA: Diagnosis not present

## 2017-10-19 DIAGNOSIS — S8262XD Displaced fracture of lateral malleolus of left fibula, subsequent encounter for closed fracture with routine healing: Secondary | ICD-10-CM | POA: Diagnosis not present

## 2017-10-19 DIAGNOSIS — I110 Hypertensive heart disease with heart failure: Secondary | ICD-10-CM | POA: Diagnosis not present

## 2017-10-19 DIAGNOSIS — M19042 Primary osteoarthritis, left hand: Secondary | ICD-10-CM | POA: Diagnosis not present

## 2017-10-19 DIAGNOSIS — I5033 Acute on chronic diastolic (congestive) heart failure: Secondary | ICD-10-CM | POA: Diagnosis not present

## 2017-10-22 DIAGNOSIS — I110 Hypertensive heart disease with heart failure: Secondary | ICD-10-CM | POA: Diagnosis not present

## 2017-10-22 DIAGNOSIS — M19042 Primary osteoarthritis, left hand: Secondary | ICD-10-CM | POA: Diagnosis not present

## 2017-10-22 DIAGNOSIS — I5033 Acute on chronic diastolic (congestive) heart failure: Secondary | ICD-10-CM | POA: Diagnosis not present

## 2017-10-22 DIAGNOSIS — S8262XD Displaced fracture of lateral malleolus of left fibula, subsequent encounter for closed fracture with routine healing: Secondary | ICD-10-CM | POA: Diagnosis not present

## 2017-10-22 DIAGNOSIS — M48061 Spinal stenosis, lumbar region without neurogenic claudication: Secondary | ICD-10-CM | POA: Diagnosis not present

## 2017-10-22 DIAGNOSIS — M19041 Primary osteoarthritis, right hand: Secondary | ICD-10-CM | POA: Diagnosis not present

## 2017-10-23 DIAGNOSIS — M25572 Pain in left ankle and joints of left foot: Secondary | ICD-10-CM | POA: Diagnosis not present

## 2017-10-24 DIAGNOSIS — I5033 Acute on chronic diastolic (congestive) heart failure: Secondary | ICD-10-CM | POA: Diagnosis not present

## 2017-10-24 DIAGNOSIS — M48061 Spinal stenosis, lumbar region without neurogenic claudication: Secondary | ICD-10-CM | POA: Diagnosis not present

## 2017-10-24 DIAGNOSIS — M19042 Primary osteoarthritis, left hand: Secondary | ICD-10-CM | POA: Diagnosis not present

## 2017-10-24 DIAGNOSIS — M19041 Primary osteoarthritis, right hand: Secondary | ICD-10-CM | POA: Diagnosis not present

## 2017-10-24 DIAGNOSIS — S8262XD Displaced fracture of lateral malleolus of left fibula, subsequent encounter for closed fracture with routine healing: Secondary | ICD-10-CM | POA: Diagnosis not present

## 2017-10-24 DIAGNOSIS — I110 Hypertensive heart disease with heart failure: Secondary | ICD-10-CM | POA: Diagnosis not present

## 2017-10-25 ENCOUNTER — Encounter (HOSPITAL_COMMUNITY)
Admission: RE | Disposition: A | Discharge status: Home / Self Care | Payer: Self-pay | Source: Ambulatory Visit | Attending: Orthopedic Surgery

## 2017-10-25 ENCOUNTER — Inpatient Hospital Stay (HOSPITAL_COMMUNITY): Payer: Medicare Other

## 2017-10-25 ENCOUNTER — Ambulatory Visit (HOSPITAL_COMMUNITY)
Admission: RE | Admit: 2017-10-25 | Discharge: 2017-10-25 | Disposition: A | Discharge status: Home / Self Care | Payer: Medicare Other | Source: Ambulatory Visit | Attending: Orthopedic Surgery | Admitting: Orthopedic Surgery

## 2017-10-25 ENCOUNTER — Encounter (HOSPITAL_COMMUNITY): Payer: Self-pay | Admitting: *Deleted

## 2017-10-25 ENCOUNTER — Inpatient Hospital Stay (HOSPITAL_COMMUNITY): Payer: Medicare Other | Admitting: Certified Registered Nurse Anesthetist

## 2017-10-25 DIAGNOSIS — I5033 Acute on chronic diastolic (congestive) heart failure: Secondary | ICD-10-CM | POA: Diagnosis not present

## 2017-10-25 DIAGNOSIS — M4326 Fusion of spine, lumbar region: Secondary | ICD-10-CM | POA: Diagnosis not present

## 2017-10-25 DIAGNOSIS — Z853 Personal history of malignant neoplasm of breast: Secondary | ICD-10-CM | POA: Diagnosis not present

## 2017-10-25 DIAGNOSIS — Z91018 Allergy to other foods: Secondary | ICD-10-CM | POA: Insufficient documentation

## 2017-10-25 DIAGNOSIS — G4733 Obstructive sleep apnea (adult) (pediatric): Secondary | ICD-10-CM | POA: Insufficient documentation

## 2017-10-25 DIAGNOSIS — K219 Gastro-esophageal reflux disease without esophagitis: Secondary | ICD-10-CM | POA: Diagnosis not present

## 2017-10-25 DIAGNOSIS — D509 Iron deficiency anemia, unspecified: Secondary | ICD-10-CM | POA: Insufficient documentation

## 2017-10-25 DIAGNOSIS — R7303 Prediabetes: Secondary | ICD-10-CM | POA: Insufficient documentation

## 2017-10-25 DIAGNOSIS — Z833 Family history of diabetes mellitus: Secondary | ICD-10-CM | POA: Diagnosis not present

## 2017-10-25 DIAGNOSIS — Z8379 Family history of other diseases of the digestive system: Secondary | ICD-10-CM | POA: Diagnosis not present

## 2017-10-25 DIAGNOSIS — Z6836 Body mass index (BMI) 36.0-36.9, adult: Secondary | ICD-10-CM | POA: Insufficient documentation

## 2017-10-25 DIAGNOSIS — Z803 Family history of malignant neoplasm of breast: Secondary | ICD-10-CM | POA: Insufficient documentation

## 2017-10-25 DIAGNOSIS — Z87891 Personal history of nicotine dependence: Secondary | ICD-10-CM | POA: Insufficient documentation

## 2017-10-25 DIAGNOSIS — Z882 Allergy status to sulfonamides status: Secondary | ICD-10-CM | POA: Diagnosis not present

## 2017-10-25 DIAGNOSIS — M19049 Primary osteoarthritis, unspecified hand: Secondary | ICD-10-CM | POA: Insufficient documentation

## 2017-10-25 DIAGNOSIS — Z820 Family history of epilepsy and other diseases of the nervous system: Secondary | ICD-10-CM | POA: Insufficient documentation

## 2017-10-25 DIAGNOSIS — R0600 Dyspnea, unspecified: Secondary | ICD-10-CM | POA: Diagnosis not present

## 2017-10-25 DIAGNOSIS — I10 Essential (primary) hypertension: Secondary | ICD-10-CM | POA: Insufficient documentation

## 2017-10-25 DIAGNOSIS — E669 Obesity, unspecified: Secondary | ICD-10-CM | POA: Diagnosis not present

## 2017-10-25 DIAGNOSIS — Z7982 Long term (current) use of aspirin: Secondary | ICD-10-CM | POA: Insufficient documentation

## 2017-10-25 DIAGNOSIS — Z8249 Family history of ischemic heart disease and other diseases of the circulatory system: Secondary | ICD-10-CM | POA: Diagnosis not present

## 2017-10-25 DIAGNOSIS — E079 Disorder of thyroid, unspecified: Secondary | ICD-10-CM | POA: Insufficient documentation

## 2017-10-25 DIAGNOSIS — M48062 Spinal stenosis, lumbar region with neurogenic claudication: Secondary | ICD-10-CM | POA: Insufficient documentation

## 2017-10-25 DIAGNOSIS — Z96641 Presence of right artificial hip joint: Secondary | ICD-10-CM | POA: Insufficient documentation

## 2017-10-25 DIAGNOSIS — I471 Supraventricular tachycardia: Secondary | ICD-10-CM | POA: Diagnosis not present

## 2017-10-25 DIAGNOSIS — Z419 Encounter for procedure for purposes other than remedying health state, unspecified: Secondary | ICD-10-CM

## 2017-10-25 DIAGNOSIS — J309 Allergic rhinitis, unspecified: Secondary | ICD-10-CM | POA: Insufficient documentation

## 2017-10-25 DIAGNOSIS — Z86010 Personal history of colonic polyps: Secondary | ICD-10-CM | POA: Insufficient documentation

## 2017-10-25 DIAGNOSIS — Z888 Allergy status to other drugs, medicaments and biological substances status: Secondary | ICD-10-CM | POA: Diagnosis not present

## 2017-10-25 DIAGNOSIS — I110 Hypertensive heart disease with heart failure: Secondary | ICD-10-CM | POA: Diagnosis not present

## 2017-10-25 DIAGNOSIS — Z811 Family history of alcohol abuse and dependence: Secondary | ICD-10-CM | POA: Diagnosis not present

## 2017-10-25 DIAGNOSIS — Z79899 Other long term (current) drug therapy: Secondary | ICD-10-CM | POA: Insufficient documentation

## 2017-10-25 DIAGNOSIS — M79604 Pain in right leg: Secondary | ICD-10-CM | POA: Diagnosis not present

## 2017-10-25 HISTORY — PX: LUMBAR LAMINECTOMY/DECOMPRESSION MICRODISCECTOMY: SHX5026

## 2017-10-25 LAB — GLUCOSE, CAPILLARY: Glucose-Capillary: 126 mg/dL — ABNORMAL HIGH (ref 65–99)

## 2017-10-25 SURGERY — LUMBAR LAMINECTOMY/DECOMPRESSION MICRODISCECTOMY
Anesthesia: General | Laterality: Bilateral

## 2017-10-25 MED ORDER — FENTANYL CITRATE (PF) 100 MCG/2ML IJ SOLN
INTRAMUSCULAR | Status: AC
  Filled 2017-10-25: qty 2

## 2017-10-25 MED ORDER — SUGAMMADEX SODIUM 200 MG/2ML IV SOLN
INTRAVENOUS | Status: DC | PRN
  Administered 2017-10-25: 200 mg via INTRAVENOUS

## 2017-10-25 MED ORDER — FENTANYL CITRATE (PF) 250 MCG/5ML IJ SOLN
INTRAMUSCULAR | Status: AC
  Filled 2017-10-25: qty 5

## 2017-10-25 MED ORDER — PROPOFOL 10 MG/ML IV BOLUS
INTRAVENOUS | Status: DC | PRN
  Administered 2017-10-25: 130 mg via INTRAVENOUS

## 2017-10-25 MED ORDER — BUPIVACAINE LIPOSOME 1.3 % IJ SUSP
INTRAMUSCULAR | Status: DC | PRN
  Administered 2017-10-25: 20 mL

## 2017-10-25 MED ORDER — METHYLENE BLUE 0.5 % INJ SOLN
INTRAVENOUS | Status: DC | PRN
  Administered 2017-10-25: .5 mL

## 2017-10-25 MED ORDER — ONDANSETRON HCL 4 MG/2ML IJ SOLN
INTRAMUSCULAR | Status: DC | PRN
  Administered 2017-10-25: 4 mg via INTRAVENOUS

## 2017-10-25 MED ORDER — ROCURONIUM BROMIDE 100 MG/10ML IV SOLN
INTRAVENOUS | Status: DC | PRN
  Administered 2017-10-25: 20 mg via INTRAVENOUS
  Administered 2017-10-25: 50 mg via INTRAVENOUS
  Administered 2017-10-25: 20 mg via INTRAVENOUS
  Administered 2017-10-25: 10 mg via INTRAVENOUS

## 2017-10-25 MED ORDER — PROPOFOL 10 MG/ML IV BOLUS
INTRAVENOUS | Status: AC
  Filled 2017-10-25: qty 20

## 2017-10-25 MED ORDER — LACTATED RINGERS IV SOLN
INTRAVENOUS | Status: DC
  Administered 2017-10-25 (×2): via INTRAVENOUS

## 2017-10-25 MED ORDER — MIDAZOLAM HCL 2 MG/2ML IJ SOLN
INTRAMUSCULAR | Status: AC
  Filled 2017-10-25: qty 2

## 2017-10-25 MED ORDER — METHYLPREDNISOLONE ACETATE 40 MG/ML IJ SUSP
INTRAMUSCULAR | Status: AC
  Filled 2017-10-25: qty 1

## 2017-10-25 MED ORDER — BUPIVACAINE LIPOSOME 1.3 % IJ SUSP
20.0000 mL | Freq: Once | INTRAMUSCULAR | Status: DC
  Filled 2017-10-25: qty 20

## 2017-10-25 MED ORDER — PHENYLEPHRINE 40 MCG/ML (10ML) SYRINGE FOR IV PUSH (FOR BLOOD PRESSURE SUPPORT)
PREFILLED_SYRINGE | INTRAVENOUS | Status: DC | PRN
  Administered 2017-10-25 (×3): 80 ug via INTRAVENOUS

## 2017-10-25 MED ORDER — MINERAL OIL LIGHT 100 % EX OIL
TOPICAL_OIL | CUTANEOUS | Status: DC | PRN
  Administered 2017-10-25: 1 via TOPICAL

## 2017-10-25 MED ORDER — MIDAZOLAM HCL 2 MG/2ML IJ SOLN
INTRAMUSCULAR | Status: DC | PRN
  Administered 2017-10-25: 2 mg via INTRAVENOUS

## 2017-10-25 MED ORDER — CEFAZOLIN SODIUM-DEXTROSE 2-4 GM/100ML-% IV SOLN
2.0000 g | INTRAVENOUS | Status: AC
  Administered 2017-10-25: 2 g via INTRAVENOUS

## 2017-10-25 MED ORDER — LIDOCAINE 2% (20 MG/ML) 5 ML SYRINGE
INTRAMUSCULAR | Status: AC
  Filled 2017-10-25: qty 5

## 2017-10-25 MED ORDER — EPHEDRINE SULFATE-NACL 50-0.9 MG/10ML-% IV SOSY
PREFILLED_SYRINGE | INTRAVENOUS | Status: DC | PRN
  Administered 2017-10-25 (×2): 10 mg via INTRAVENOUS

## 2017-10-25 MED ORDER — CEFAZOLIN SODIUM-DEXTROSE 2-4 GM/100ML-% IV SOLN
INTRAVENOUS | Status: AC
  Filled 2017-10-25: qty 100

## 2017-10-25 MED ORDER — SUGAMMADEX SODIUM 200 MG/2ML IV SOLN
INTRAVENOUS | Status: AC
  Filled 2017-10-25: qty 2

## 2017-10-25 MED ORDER — DEXAMETHASONE SODIUM PHOSPHATE 10 MG/ML IJ SOLN
INTRAMUSCULAR | Status: DC | PRN
  Administered 2017-10-25: 10 mg via INTRAVENOUS

## 2017-10-25 MED ORDER — FENTANYL CITRATE (PF) 100 MCG/2ML IJ SOLN
INTRAMUSCULAR | Status: DC | PRN
  Administered 2017-10-25: 50 ug via INTRAVENOUS
  Administered 2017-10-25: 100 ug via INTRAVENOUS
  Administered 2017-10-25: 25 ug via INTRAVENOUS
  Administered 2017-10-25 (×2): 50 ug via INTRAVENOUS
  Administered 2017-10-25: 100 ug via INTRAVENOUS
  Administered 2017-10-25: 25 ug via INTRAVENOUS
  Administered 2017-10-25 (×2): 50 ug via INTRAVENOUS

## 2017-10-25 MED ORDER — METHYLPREDNISOLONE ACETATE 40 MG/ML IJ SUSP
INTRAMUSCULAR | Status: DC | PRN
  Administered 2017-10-25: 40 mg

## 2017-10-25 MED ORDER — FENTANYL CITRATE (PF) 100 MCG/2ML IJ SOLN
25.0000 ug | INTRAMUSCULAR | Status: DC | PRN
  Administered 2017-10-25 (×4): 25 ug via INTRAVENOUS

## 2017-10-25 MED ORDER — SURGIFOAM 100 EX MISC
CUTANEOUS | Status: DC | PRN
  Administered 2017-10-25: 12:00:00 via TOPICAL

## 2017-10-25 MED ORDER — MINERAL OIL LIGHT 100 % EX OIL
TOPICAL_OIL | CUTANEOUS | Status: AC
  Filled 2017-10-25: qty 25

## 2017-10-25 MED ORDER — POVIDONE-IODINE 7.5 % EX SOLN: Freq: Once | CUTANEOUS | Status: DC

## 2017-10-25 MED ORDER — BUPIVACAINE-EPINEPHRINE 0.25% -1:200000 IJ SOLN
INTRAMUSCULAR | Status: DC | PRN
  Administered 2017-10-25: 7 mL
  Administered 2017-10-25: 23 mL

## 2017-10-25 MED ORDER — ONDANSETRON HCL 4 MG/2ML IJ SOLN
INTRAMUSCULAR | Status: AC
  Filled 2017-10-25: qty 2

## 2017-10-25 MED ORDER — LIDOCAINE 2% (20 MG/ML) 5 ML SYRINGE
INTRAMUSCULAR | Status: DC | PRN
  Administered 2017-10-25: 100 mg via INTRAVENOUS

## 2017-10-25 SURGICAL SUPPLY — 72 items
APL SKNCLS STERI-STRIP NONHPOA (GAUZE/BANDAGES/DRESSINGS) ×1
BENZOIN TINCTURE PRP APPL 2/3 (GAUZE/BANDAGES/DRESSINGS) ×1 IMPLANT
BUR ROUND PRECISION 4.0 (BURR) ×2 IMPLANT
CANISTER SUCT 3000ML PPV (MISCELLANEOUS) ×2 IMPLANT
CARTRIDGE OIL MAESTRO DRILL (MISCELLANEOUS) ×1 IMPLANT
CORDS BIPOLAR (ELECTRODE) ×2 IMPLANT
COVER SURGICAL LIGHT HANDLE (MISCELLANEOUS) ×2 IMPLANT
DIFFUSER DRILL AIR PNEUMATIC (MISCELLANEOUS) ×2 IMPLANT
DRAIN CHANNEL 15F RND FF W/TCR (WOUND CARE) IMPLANT
DRAPE POUCH INSTRU U-SHP 10X18 (DRAPES) ×4 IMPLANT
DRAPE SURG 17X23 STRL (DRAPES) ×8 IMPLANT
DURAPREP 26ML APPLICATOR (WOUND CARE) ×2 IMPLANT
ELECT BLADE 4.0 EZ CLEAN MEGAD (MISCELLANEOUS) ×2
ELECT CAUTERY BLADE 6.4 (BLADE) ×2 IMPLANT
ELECT REM PT RETURN 9FT ADLT (ELECTROSURGICAL) ×2
ELECTRODE BLDE 4.0 EZ CLN MEGD (MISCELLANEOUS) ×1 IMPLANT
ELECTRODE REM PT RTRN 9FT ADLT (ELECTROSURGICAL) ×1 IMPLANT
EVACUATOR SILICONE 100CC (DRAIN) IMPLANT
FILTER STRAW FLUID ASPIR (MISCELLANEOUS) ×2 IMPLANT
GAUZE SPONGE 4X4 12PLY STRL (GAUZE/BANDAGES/DRESSINGS) ×2 IMPLANT
GAUZE SPONGE 4X4 16PLY XRAY LF (GAUZE/BANDAGES/DRESSINGS) ×4 IMPLANT
GLOVE BIO SURGEON STRL SZ7 (GLOVE) ×3 IMPLANT
GLOVE BIO SURGEON STRL SZ8 (GLOVE) ×3 IMPLANT
GLOVE BIOGEL PI IND STRL 7.0 (GLOVE) ×1 IMPLANT
GLOVE BIOGEL PI IND STRL 8 (GLOVE) ×1 IMPLANT
GLOVE BIOGEL PI INDICATOR 7.0 (GLOVE) ×1
GLOVE BIOGEL PI INDICATOR 8 (GLOVE) ×1
GOWN STRL REUS W/ TWL LRG LVL3 (GOWN DISPOSABLE) ×1 IMPLANT
GOWN STRL REUS W/ TWL XL LVL3 (GOWN DISPOSABLE) ×2 IMPLANT
GOWN STRL REUS W/TWL LRG LVL3 (GOWN DISPOSABLE) ×6
GOWN STRL REUS W/TWL XL LVL3 (GOWN DISPOSABLE) ×4
IV CATH 14GX2 1/4 (CATHETERS) ×2 IMPLANT
KIT BASIN OR (CUSTOM PROCEDURE TRAY) ×2 IMPLANT
KIT POSITION SURG JACKSON T1 (MISCELLANEOUS) ×2 IMPLANT
KIT TURNOVER KIT B (KITS) ×2 IMPLANT
NDL 18GX1X1/2 (RX/OR ONLY) (NEEDLE) ×1 IMPLANT
NDL HYPO 25GX1X1/2 BEV (NEEDLE) ×1 IMPLANT
NDL SPNL 18GX3.5 QUINCKE PK (NEEDLE) ×2 IMPLANT
NEEDLE 18GX1X1/2 (RX/OR ONLY) (NEEDLE) ×2 IMPLANT
NEEDLE 22X1 1/2 (OR ONLY) (NEEDLE) ×2 IMPLANT
NEEDLE HYPO 25GX1X1/2 BEV (NEEDLE) ×2 IMPLANT
NEEDLE SPNL 18GX3.5 QUINCKE PK (NEEDLE) ×4 IMPLANT
NS IRRIG 1000ML POUR BTL (IV SOLUTION) ×2 IMPLANT
OIL CARTRIDGE MAESTRO DRILL (MISCELLANEOUS) ×2
PACK LAMINECTOMY ORTHO (CUSTOM PROCEDURE TRAY) ×2 IMPLANT
PACK UNIVERSAL I (CUSTOM PROCEDURE TRAY) ×2 IMPLANT
PAD ARMBOARD 7.5X6 YLW CONV (MISCELLANEOUS) ×4 IMPLANT
PATTIES SURGICAL .5 X.5 (GAUZE/BANDAGES/DRESSINGS) ×1 IMPLANT
PATTIES SURGICAL .5 X1 (DISPOSABLE) ×3 IMPLANT
SPONGE INTESTINAL PEANUT (DISPOSABLE) ×3 IMPLANT
SPONGE SURGIFOAM ABS GEL 100 (HEMOSTASIS) ×2 IMPLANT
SPONGE SURGIFOAM ABS GEL SZ50 (HEMOSTASIS) ×2 IMPLANT
STRIP CLOSURE SKIN 1/2X4 (GAUZE/BANDAGES/DRESSINGS) ×1 IMPLANT
SURGIFLO W/THROMBIN 8M KIT (HEMOSTASIS) ×1 IMPLANT
SUT MNCRL AB 4-0 PS2 18 (SUTURE) ×2 IMPLANT
SUT VIC AB 0 CT1 18XCR BRD 8 (SUTURE) IMPLANT
SUT VIC AB 0 CT1 27 (SUTURE)
SUT VIC AB 0 CT1 27XBRD ANBCTR (SUTURE) IMPLANT
SUT VIC AB 0 CT1 8-18 (SUTURE) ×2
SUT VIC AB 1 CT1 18XCR BRD 8 (SUTURE) ×1 IMPLANT
SUT VIC AB 1 CT1 8-18 (SUTURE) ×2
SUT VIC AB 2-0 CT2 18 VCP726D (SUTURE) ×3 IMPLANT
SYR 20CC LL (SYRINGE) ×2 IMPLANT
SYR BULB IRRIGATION 50ML (SYRINGE) ×2 IMPLANT
SYR CONTROL 10ML LL (SYRINGE) ×4 IMPLANT
SYR TB 1ML 26GX3/8 SAFETY (SYRINGE) ×4 IMPLANT
SYR TB 1ML LUER SLIP (SYRINGE) ×4 IMPLANT
TAPE CLOTH SURG 4X10 WHT LF (GAUZE/BANDAGES/DRESSINGS) ×1 IMPLANT
TOWEL OR 17X24 6PK STRL BLUE (TOWEL DISPOSABLE) ×2 IMPLANT
TOWEL OR 17X26 10 PK STRL BLUE (TOWEL DISPOSABLE) ×2 IMPLANT
WATER STERILE IRR 1000ML POUR (IV SOLUTION) ×2 IMPLANT
YANKAUER SUCT BULB TIP NO VENT (SUCTIONS) ×3 IMPLANT

## 2017-10-25 NOTE — H&P (Signed)
PREOPERATIVE H&P  Chief Complaint: Bilateral leg pain   HPI: [name] is a 77 y.o. female who presents with ongoing pain in the pain and weakness in the bilateral legs  MRI reveals stenosis at mainly L2/3 and also at L3/4  Patient has failed multiple forms of conservative care and continues to have pain (see office notes for additional details regarding the patient's full course of treatment)  Past Medical History:  Diagnosis Date  . Allergic rhinitis   . Ankle fracture    Left, [place]  . Arthritis    "knuckles" (06/17/2012)  . 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 Breast cancer ([place])    "right" (06/17/2012)  . Cataracts, bilateral   . Dyspnea    with exertion  . Exertional dyspnea   . GERD (gastroesophageal reflux disease)   . Hypertension   . Iron deficiency anemia    "hematologist watches it" (06/17/2012)  . Obesity   . OSA treated with BiPAP 03/08/2016  . Osteoarthritis    severe right hip osteoarthritis-s/p hip replacement  . Osteopenia 11/26/2009  . Thyroid disorder    "sees endocrinologist yearly" (06/17/2012)   Past Surgical History:  Procedure Laterality Date  . ANKLE FRACTURE SURGERY Right 01/01/14   Has pins. Procedure performed at [facility]   "bilaterlly" (06/17/2012)  . COLONOSCOPY W/ POLYPECTOMY    . EYE SURGERY  2011   cataract removal both eyes  . HIP CLOSED REDUCTION  07/26/2012   Procedure: CLOSED MANIPULATION HIP;  Surgeon: Nita Sells, MD;  Location: [facility];  Service: Orthopedics;  Laterality: Right;  . MASTECTOMY  1993?   bilateral mastectomy  . ORIF ANKLE FRACTURE Left 07/30/2017   Procedure: LEFT OPEN REDUCTION INTERNAL FIXATION (ORIF) ANKLE FRACTURE WITH SYDESMOTIC FIXATION;  Surgeon: Dorna Leitz, MD;  Location: [place];  Service: Orthopedics;  Laterality: Left;  . TONSILLECTOMY  1949  . TOTAL HIP ARTHROPLASTY  04/2006   right hip   . TOTAL HIP REVISION  06/17/2012   "right"  (06/17/2012)  . TOTAL HIP REVISION  06/17/2012   Procedure: TOTAL HIP REVISION;  Surgeon: Kerin Salen, MD;  Location: [facility];  Service: Orthopedics;  Laterality: Right;   Social History   Socioeconomic History  . Marital status: Widowed    Spouse name: Not on file  . Number of children: Not on file  . Years of education: Not on file  . Highest education level: Not on file  Occupational History  . Not on file  Social Needs  . Financial resource strain: Not on file  . Food insecurity:    Worry: Not on file    Inability: Not on file  . Transportation needs:    Medical: Not on file    Non-medical: Not on file  Tobacco Use  . Smoking status: Former Smoker    Packs/day: 0.50    Years: 10.00    Pack years: 5.00    Types: Cigarettes    Last attempt to quit: 07/14/1974    Years since quitting: 43.3  . Smokeless tobacco: Never Used  . Tobacco comment: quit in 1976 smoked for approx. 10 years  Substance and Sexual Activity  . Alcohol use: Not Currently  . Drug use: No  . Sexual activity: Never  Lifestyle  . Physical activity:    Days per week: Not on file    Minutes per session: Not on file  . Stress: Not on file  Relationships  . Social connections:    Talks on phone: Not on file    Gets together: Not on file    Attends religious service: Not on file    Active member of club or organization: Not on file    Attends meetings of clubs or organizations: Not on file    Relationship status: Not on file  Other Topics Concern  . Not on file  Social History Narrative   Retired   The patient is married and has one child and two grandchildren that live in the area.     She drinks wine with dinner.  She quit smoking in 1976, smoked for   approximately 10 years.      Moved to [place] from [place], [place]         Family History  Problem Relation Age of Onset  . Heart failure Father        deceased age 69  . Diabetes Father   . Alcohol abuse Father   . Breast cancer Mother         deceased at age 83 secondary to breast cancer  . Liver disease Sister        Liver failure--deceased  . Dementia Maternal Grandmother   . Colon cancer Neg Hx   . Esophageal cancer Neg Hx   . Rectal cancer Neg Hx   . Stomach cancer Neg Hx    Allergies  Allergen Reactions  . Amlodipine Swelling    Swelling   . Sulfonamide Derivatives Swelling    REACTION: Swelling of the face; "eyes swelled shut"  . Chocolate Other (See Comments)    Sinus problems   Prior to Admission medications   Medication Sig Start Date End Date Taking? Authorizing Provider  acetaminophen (TYLENOL) 500 MG tablet Take 1,000 mg by mouth 3 (three) times daily. With gabapentin   Yes [provider]  bisoprolol (ZEBETA) 10 MG tablet TAKE 2 TABLETS EVERY DAY 10/10/17  Yes Debbrah Alar, NP  Cholecalciferol (VITAMIN D) 2000 units tablet Take 4,000 Units by mouth daily.   Yes [provider]  cloNIDine (CATAPRES) 0.1 MG tablet TAKE 1 TABLET TWICE DAILY Patient taking differently: Take 0.1 mg twice daily as needed for systolic bp of 376 or greater 04/18/17  Yes Debbrah Alar, NP  fexofenadine (ALLEGRA) 180 MG tablet Take 180 mg by mouth daily as needed. For seasonal allergies   Yes [provider]  FLUoxetine (PROZAC) 40 MG capsule Take 1 capsule (40 mg total) by mouth daily. 10/10/17  Yes Debbrah Alar, NP  fluticasone (FLONASE) 50 MCG/ACT nasal spray USE 1 SPRAY IN EACH NOSTRIL EVERY DAY Patient taking differently: Use 1 spray in each nostril once daily as needed for congestion 04/18/17  Yes Debbrah Alar, NP  furosemide (LASIX) 20 MG tablet Take 1 tablet every morning, 1 tablet after 3pm and may take 1 tablet every evening as needed for swelling. Patient taking differently: Take 20 mg by mouth 2 (two) times daily.  10/10/17  Yes Debbrah Alar, NP  gabapentin (NEURONTIN) 100 MG capsule Take 1 capsule (100 mg total) by mouth 3 (three) times daily. 10/10/17  Yes Debbrah Alar, NP  KLOR-CON M20 20 MEQ tablet TAKE 1 TABLET EVERY DAY 10/10/17  Yes Debbrah Alar, NP  Lidocaine (ASPERCREME LIDOCAINE) 4 % PTCH Apply 1 patch topically daily as needed (pain).   Yes [provider]  losartan (COZAAR) 100 MG tablet TAKE 1 TABLET EVERY DAY Patient taking differently: Take 100 mg by mouth  once daily in the evening 10/10/17  Yes Debbrah Alar, NP  Omega-3 Fatty Acids (FISH OIL) 1000 MG CAPS Take 1,000 mg by mouth daily.   Yes [provider]  omeprazole (PRILOSEC) 40 MG capsule TAKE 1 CAPSULE (40 MG TOTAL) EVERY DAY 04/18/17  Yes Debbrah Alar, NP  oxyCODONE (OXY IR/ROXICODONE) 5 MG immediate release tablet Take 5 mg by mouth 3 (three) times a week. Prior to physical therapy   Yes [provider]  Polyethyl Glycol-Propyl Glycol (SYSTANE OP) Apply 1 drop to eye daily as needed (dry eyes).   Yes [provider]  simvastatin (ZOCOR) 10 MG tablet TAKE 1 TABLET EVERY DAY Patient taking differently: Take 10 mg by mouth once daily in the evening 10/10/17  Yes Debbrah Alar, NP  aspirin EC 325 MG EC tablet Take 1 tablet (325 mg total) by mouth daily. 08/02/17   Gary Fleet, PA-C  oxyCODONE-acetaminophen (PERCOCET/ROXICET) 5-325 MG tablet Take 1-2 tablets by mouth every 6 (six) hours as needed for severe pain. Patient not taking: Reported on 10/16/2017 07/30/17   Gary Fleet, PA-C     All other systems have been reviewed and were otherwise negative with the exception of those mentioned in the HPI and as above.  Physical Exam: There were no vitals filed for this visit.  There is no height or weight on file to calculate BMI.  General: Alert, no acute distress Cardiovascular: No pedal edema Respiratory: No cyanosis, no use of accessory musculature Skin: No lesions in the area of chief complaint Neurologic: Sensation intact distally Psychiatric: Patient is competent for consent with normal mood and affect Lymphatic: No  axillary or cervical lymphadenopathy   Assessment/Plan: BILATERAL LEG PAIN Plan for Procedure(s): LUMBAR 2-3, POSSIBLE LUMBAR 3-4 DECOMPRESSION   Sinclair Ship, MD 10/25/2017 6:45 AM

## 2017-10-25 NOTE — Anesthesia Preprocedure Evaluation (Signed)
Anesthesia Evaluation  Patient identified by MRN, date of birth, ID band Patient awake    Reviewed: Allergy & Precautions, H&P , Patient's Chart, lab work & pertinent test results, reviewed documented beta blocker date and time   Airway Mallampati: II  TM Distance: >3 FB Neck ROM: full    Dental no notable dental hx.    Pulmonary sleep apnea and Continuous Positive Airway Pressure Ventilation , former smoker,    Pulmonary exam normal breath sounds clear to auscultation       Cardiovascular hypertension,  Rhythm:regular Rate:Normal     Neuro/Psych    GI/Hepatic   Endo/Other  Morbid obesity  Renal/GU      Musculoskeletal   Abdominal   Peds  Hematology   Anesthesia Other Findings  The left ventricular ejection fraction is normal (55-65%).  Nuclear stress EF: 64%. No wall motion abnormalities  There was no ST segment deviation noted during stress. Bowel loop artifact noted during stress.  This is a low risk study. No ischemia identified.    Reproductive/Obstetrics                             Anesthesia Physical  Anesthesia Plan  ASA: III  Anesthesia Plan: General   Post-op Pain Management:  Regional for Post-op pain   Induction: Intravenous  PONV Risk Score and Plan: 2 and Treatment may vary due to age or medical condition, Dexamethasone and Ondansetron  Airway Management Planned: Oral ETT  Additional Equipment:   Intra-op Plan:   Post-operative Plan: Extubation in OR  Informed Consent: I have reviewed the patients History and Physical, chart, labs and discussed the procedure including the risks, benefits and alternatives for the proposed anesthesia with the patient or authorized representative who has indicated his/her understanding and acceptance.   Dental Advisory Given  Plan Discussed with: CRNA and Surgeon  Anesthesia Plan Comments: ( )        Anesthesia Quick  Evaluation

## 2017-10-25 NOTE — Anesthesia Procedure Notes (Signed)
Procedure Name: Intubation Date/Time: 10/25/2017 11:18 AM Performed by: Leonor Liv, CRNA Pre-anesthesia Checklist: Patient identified, Emergency Drugs available, Suction available and Patient being monitored Patient Re-evaluated:Patient Re-evaluated prior to induction Oxygen Delivery Method: Circle System Utilized Preoxygenation: Pre-oxygenation with 100% oxygen Induction Type: IV induction Ventilation: Mask ventilation without difficulty Laryngoscope Size: Mac and 3 Grade View: Grade I Tube type: Oral Tube size: 7.0 mm Number of attempts: 1 Airway Equipment and Method: Stylet and Oral airway Placement Confirmation: ETT inserted through vocal cords under direct vision,  positive ETCO2 and breath sounds checked- equal and bilateral Secured at: 21 cm Tube secured with: Tape Dental Injury: Teeth and Oropharynx as per pre-operative assessment

## 2017-10-25 NOTE — Transfer of Care (Signed)
Immediate Anesthesia Transfer of Care Note  Patient: [name]  Procedure(s) Performed: LUMBAR 2-3, LUMBAR 3-4 DECOMPRESSION TIME REQUESTED 4.5 HOURS (Bilateral )  Patient Location: PACU  Anesthesia Type:General  Level of Consciousness: awake, alert  and oriented  Airway & Oxygen Therapy: Patient Spontanous Breathing and Patient connected to nasal cannula oxygen  Post-op Assessment: Report given to RN, Post -op Vital signs reviewed and stable and Patient moving all extremities  Post vital signs: Reviewed and stable  Last Vitals:  Vitals Value Taken Time  BP 108/49 10/25/2017  3:45 PM  Temp    Pulse 65 10/25/2017  3:47 PM  Resp 10 10/25/2017  3:47 PM  SpO2 100 % 10/25/2017  3:47 PM  Vitals shown include unvalidated device data.  Last Pain:  Vitals:   10/25/17 0941  PainSc: 5          Complications: No apparent anesthesia complications

## 2017-10-25 NOTE — Op Note (Signed)
NAME:  [name]             MEDICAL RECORD NO.:  [id number]  PHYSICIAN:  Phylliss Bob, MD      DATE OF BIRTH:  [birth year]  DATE OF PROCEDURE:  10/25/2017                               OPERATIVE REPORT   PREOPERATIVE DIAGNOSES: 1. Bilateral leg pain. 2. Neurogenic claudication. 3. 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 Bilateral leg pain. 2. Neurogenic claudication. 3. 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  SURGEON:  Phylliss Bob, MD.  ASSISTANTPricilla Holm, PA-C.  ANESTHESIA:  General endotracheal anesthesia.  COMPLICATIONS:  None.  DISPOSITION:  Stable.  ESTIMATED BLOOD LOSS:  Minimal.  INDICATIONS FOR SURGERY:  Briefly, [name] is a very pleasant 77 year-old female, who did present to me with pain and weakness in the bilateral legs. The patient's MRI did reveal spinal stenosis at L2/3 and L3/4.  We did proceed with appropriate conservative treatment, but the patient did continue to have ongoing pain, which he did feel was limiting her function substantially.  Given the patient's ongoing pain and dysfunction, we did discuss proceeding with the procedure reflected above.  The patient was fully aware of the risks and limitations of surgery and did wish to proceed. Of note, the patient did clearly have extensive and multiple degenerative changes throughout her lumbar spine, and she did understand that the goal of today's surgery was to address her more acute nerve compression, but that she may have additional symptoms related to her extensive pathology in her lumbar spine,  which may require additional surgery  OPERATIVE DETAILS:  On 10/25/2017, the patient was brought to surgery and general endotracheal anesthesia was administered.  The patient was placed prone on a well-padded flat Jackson bed with a jackson frame. Antibiotics  were given and the back was prepped and draped in the usual sterile fashion.  A time-out procedure was performed.  I then made a midline incision overlying the L2-3 and L3-4 intervertebral spaces.  The fascia was incised at the midline.  The paraspinal musculature was bluntly retracted laterally and held retracted with a self-retaining retractor. After confirming the appropriate operative level, I did remove the spinous process of L3, and partially at L2.  At this point, I proceeded with a partial facetectomy on the right and left sides at L3/4.  Of note, there was very substantial and very significant overgrowth of the facet joint bilaterally, and there was also very substantial hypertrophy of the ligamentum flavum in addition to epidural lipomatiosis.  This was causing very severe spinal stenosis.  The lateral recess stenosis was addressed by using Kerrison punches to thoroughly and decompress the right and left lateral recess.  A decompression was then performed at  L2/3 in the same manner, with even more prominent stenosis clearly demonstrated. At the termination of the decompression, I was able to pass a [facility] out the neuro foramen on the right and left sides at the L2/3 and L3/4 levels.  The spinal canal was entirely decompressed.  All bleeding was then adequately controlled.  At this point, 40 mg of Depo-Medrol was introduced about the epidural space.  Prior to this, the wound was copiously irrigated with a total of approximately 2 L of normal saline. Gelfoam was placed  over the laminectomy site.  I was very pleased with the decompression.  There was no extravasation of cerebrospinal fluid noted throughout the entire surgery.  At this point, the wound was closed in layers using #1 Vicryl, followed by 2-0 Vicryl, followed by 4- 0 Monocryl.  Benzoin and Steri-Strips were applied, followed by a sterile dressing.  All instrument counts were correct at the termination of the  procedure.  Of note, Pricilla Holm, PA-C, was my assistant throughout surgery, and did aid in retraction, suctioning, and closure from start to finish.     Phylliss Bob, MD

## 2017-10-25 NOTE — Discharge Instructions (Signed)

## 2017-10-25 NOTE — Anesthesia Postprocedure Evaluation (Signed)
Anesthesia Post Note  Patient: [name]  Procedure(s) Performed: LUMBAR 2-3, LUMBAR 3-4 DECOMPRESSION TIME REQUESTED 4.5 HOURS (Bilateral )     Patient location during evaluation: PACU Anesthesia Type: General Level of consciousness: sedated Pain management: pain level controlled Vital Signs Assessment: post-procedure vital signs reviewed and stable Respiratory status: spontaneous breathing and respiratory function stable Cardiovascular status: stable Postop Assessment: no apparent nausea or vomiting Anesthetic complications: no    Last Vitals:  Vitals:   10/25/17 1615 10/25/17 1630  BP: (!) 120/96 (!) 126/57  Pulse: 65 66  Resp: 10 10  Temp:    SpO2: 100% 100%    Last Pain:  Vitals:   10/25/17 1630  PainSc: 5                  Ferrah Panagopoulos DANIEL

## 2017-10-26 ENCOUNTER — Encounter (HOSPITAL_COMMUNITY): Payer: Self-pay | Admitting: Orthopedic Surgery

## 2017-10-26 HISTORY — PX: LUMBAR LAMINECTOMY: SHX95

## 2017-10-29 DIAGNOSIS — M48061 Spinal stenosis, lumbar region without neurogenic claudication: Secondary | ICD-10-CM | POA: Diagnosis not present

## 2017-10-29 DIAGNOSIS — S8262XD Displaced fracture of lateral malleolus of left fibula, subsequent encounter for closed fracture with routine healing: Secondary | ICD-10-CM | POA: Diagnosis not present

## 2017-10-29 DIAGNOSIS — I5033 Acute on chronic diastolic (congestive) heart failure: Secondary | ICD-10-CM | POA: Diagnosis not present

## 2017-10-29 DIAGNOSIS — I110 Hypertensive heart disease with heart failure: Secondary | ICD-10-CM | POA: Diagnosis not present

## 2017-10-29 DIAGNOSIS — M19041 Primary osteoarthritis, right hand: Secondary | ICD-10-CM | POA: Diagnosis not present

## 2017-10-29 DIAGNOSIS — M19042 Primary osteoarthritis, left hand: Secondary | ICD-10-CM | POA: Diagnosis not present

## 2017-10-31 DIAGNOSIS — M19042 Primary osteoarthritis, left hand: Secondary | ICD-10-CM | POA: Diagnosis not present

## 2017-10-31 DIAGNOSIS — S8262XD Displaced fracture of lateral malleolus of left fibula, subsequent encounter for closed fracture with routine healing: Secondary | ICD-10-CM | POA: Diagnosis not present

## 2017-10-31 DIAGNOSIS — I110 Hypertensive heart disease with heart failure: Secondary | ICD-10-CM | POA: Diagnosis not present

## 2017-10-31 DIAGNOSIS — I5033 Acute on chronic diastolic (congestive) heart failure: Secondary | ICD-10-CM | POA: Diagnosis not present

## 2017-10-31 DIAGNOSIS — M19041 Primary osteoarthritis, right hand: Secondary | ICD-10-CM | POA: Diagnosis not present

## 2017-10-31 DIAGNOSIS — M48061 Spinal stenosis, lumbar region without neurogenic claudication: Secondary | ICD-10-CM | POA: Diagnosis not present

## 2017-11-02 DIAGNOSIS — S8262XD Displaced fracture of lateral malleolus of left fibula, subsequent encounter for closed fracture with routine healing: Secondary | ICD-10-CM | POA: Diagnosis not present

## 2017-11-02 DIAGNOSIS — M19042 Primary osteoarthritis, left hand: Secondary | ICD-10-CM | POA: Diagnosis not present

## 2017-11-02 DIAGNOSIS — M48061 Spinal stenosis, lumbar region without neurogenic claudication: Secondary | ICD-10-CM | POA: Diagnosis not present

## 2017-11-02 DIAGNOSIS — M19041 Primary osteoarthritis, right hand: Secondary | ICD-10-CM | POA: Diagnosis not present

## 2017-11-02 DIAGNOSIS — I110 Hypertensive heart disease with heart failure: Secondary | ICD-10-CM | POA: Diagnosis not present

## 2017-11-02 DIAGNOSIS — I5033 Acute on chronic diastolic (congestive) heart failure: Secondary | ICD-10-CM | POA: Diagnosis not present

## 2017-11-06 DIAGNOSIS — Z9889 Other specified postprocedural states: Secondary | ICD-10-CM | POA: Diagnosis not present

## 2017-11-07 DIAGNOSIS — S8262XD Displaced fracture of lateral malleolus of left fibula, subsequent encounter for closed fracture with routine healing: Secondary | ICD-10-CM | POA: Diagnosis not present

## 2017-11-07 DIAGNOSIS — M19042 Primary osteoarthritis, left hand: Secondary | ICD-10-CM | POA: Diagnosis not present

## 2017-11-07 DIAGNOSIS — M19041 Primary osteoarthritis, right hand: Secondary | ICD-10-CM | POA: Diagnosis not present

## 2017-11-07 DIAGNOSIS — I5033 Acute on chronic diastolic (congestive) heart failure: Secondary | ICD-10-CM | POA: Diagnosis not present

## 2017-11-07 DIAGNOSIS — I110 Hypertensive heart disease with heart failure: Secondary | ICD-10-CM | POA: Diagnosis not present

## 2017-11-07 DIAGNOSIS — M48061 Spinal stenosis, lumbar region without neurogenic claudication: Secondary | ICD-10-CM | POA: Diagnosis not present

## 2017-11-09 DIAGNOSIS — M48061 Spinal stenosis, lumbar region without neurogenic claudication: Secondary | ICD-10-CM | POA: Diagnosis not present

## 2017-11-09 DIAGNOSIS — S8262XD Displaced fracture of lateral malleolus of left fibula, subsequent encounter for closed fracture with routine healing: Secondary | ICD-10-CM | POA: Diagnosis not present

## 2017-11-09 DIAGNOSIS — I5033 Acute on chronic diastolic (congestive) heart failure: Secondary | ICD-10-CM | POA: Diagnosis not present

## 2017-11-09 DIAGNOSIS — M19041 Primary osteoarthritis, right hand: Secondary | ICD-10-CM | POA: Diagnosis not present

## 2017-11-09 DIAGNOSIS — I110 Hypertensive heart disease with heart failure: Secondary | ICD-10-CM | POA: Diagnosis not present

## 2017-11-09 DIAGNOSIS — M19042 Primary osteoarthritis, left hand: Secondary | ICD-10-CM | POA: Diagnosis not present

## 2017-11-13 DIAGNOSIS — M19041 Primary osteoarthritis, right hand: Secondary | ICD-10-CM | POA: Diagnosis not present

## 2017-11-13 DIAGNOSIS — S8262XD Displaced fracture of lateral malleolus of left fibula, subsequent encounter for closed fracture with routine healing: Secondary | ICD-10-CM | POA: Diagnosis not present

## 2017-11-13 DIAGNOSIS — I110 Hypertensive heart disease with heart failure: Secondary | ICD-10-CM | POA: Diagnosis not present

## 2017-11-13 DIAGNOSIS — M19042 Primary osteoarthritis, left hand: Secondary | ICD-10-CM | POA: Diagnosis not present

## 2017-11-13 DIAGNOSIS — I5033 Acute on chronic diastolic (congestive) heart failure: Secondary | ICD-10-CM | POA: Diagnosis not present

## 2017-11-13 DIAGNOSIS — M48061 Spinal stenosis, lumbar region without neurogenic claudication: Secondary | ICD-10-CM | POA: Diagnosis not present

## 2017-11-18 DIAGNOSIS — M19042 Primary osteoarthritis, left hand: Secondary | ICD-10-CM | POA: Diagnosis not present

## 2017-11-18 DIAGNOSIS — I5033 Acute on chronic diastolic (congestive) heart failure: Secondary | ICD-10-CM | POA: Diagnosis not present

## 2017-11-18 DIAGNOSIS — M19041 Primary osteoarthritis, right hand: Secondary | ICD-10-CM | POA: Diagnosis not present

## 2017-11-18 DIAGNOSIS — M48061 Spinal stenosis, lumbar region without neurogenic claudication: Secondary | ICD-10-CM | POA: Diagnosis not present

## 2017-11-18 DIAGNOSIS — S8262XD Displaced fracture of lateral malleolus of left fibula, subsequent encounter for closed fracture with routine healing: Secondary | ICD-10-CM | POA: Diagnosis not present

## 2017-11-18 DIAGNOSIS — I110 Hypertensive heart disease with heart failure: Secondary | ICD-10-CM | POA: Diagnosis not present

## 2017-11-19 DIAGNOSIS — S8262XD Displaced fracture of lateral malleolus of left fibula, subsequent encounter for closed fracture with routine healing: Secondary | ICD-10-CM | POA: Diagnosis not present

## 2017-11-19 DIAGNOSIS — M19042 Primary osteoarthritis, left hand: Secondary | ICD-10-CM | POA: Diagnosis not present

## 2017-11-19 DIAGNOSIS — I110 Hypertensive heart disease with heart failure: Secondary | ICD-10-CM | POA: Diagnosis not present

## 2017-11-19 DIAGNOSIS — M48061 Spinal stenosis, lumbar region without neurogenic claudication: Secondary | ICD-10-CM | POA: Diagnosis not present

## 2017-11-19 DIAGNOSIS — I5033 Acute on chronic diastolic (congestive) heart failure: Secondary | ICD-10-CM | POA: Diagnosis not present

## 2017-11-19 DIAGNOSIS — M19041 Primary osteoarthritis, right hand: Secondary | ICD-10-CM | POA: Diagnosis not present

## 2017-11-20 DIAGNOSIS — I110 Hypertensive heart disease with heart failure: Secondary | ICD-10-CM | POA: Diagnosis not present

## 2017-11-20 DIAGNOSIS — M48061 Spinal stenosis, lumbar region without neurogenic claudication: Secondary | ICD-10-CM | POA: Diagnosis not present

## 2017-11-20 DIAGNOSIS — M19041 Primary osteoarthritis, right hand: Secondary | ICD-10-CM | POA: Diagnosis not present

## 2017-11-20 DIAGNOSIS — I5033 Acute on chronic diastolic (congestive) heart failure: Secondary | ICD-10-CM | POA: Diagnosis not present

## 2017-11-20 DIAGNOSIS — S8262XD Displaced fracture of lateral malleolus of left fibula, subsequent encounter for closed fracture with routine healing: Secondary | ICD-10-CM | POA: Diagnosis not present

## 2017-11-20 DIAGNOSIS — M19042 Primary osteoarthritis, left hand: Secondary | ICD-10-CM | POA: Diagnosis not present

## 2017-11-21 DIAGNOSIS — M19042 Primary osteoarthritis, left hand: Secondary | ICD-10-CM | POA: Diagnosis not present

## 2017-11-21 DIAGNOSIS — M48061 Spinal stenosis, lumbar region without neurogenic claudication: Secondary | ICD-10-CM | POA: Diagnosis not present

## 2017-11-21 DIAGNOSIS — S8262XD Displaced fracture of lateral malleolus of left fibula, subsequent encounter for closed fracture with routine healing: Secondary | ICD-10-CM | POA: Diagnosis not present

## 2017-11-21 DIAGNOSIS — I5033 Acute on chronic diastolic (congestive) heart failure: Secondary | ICD-10-CM | POA: Diagnosis not present

## 2017-11-21 DIAGNOSIS — M19041 Primary osteoarthritis, right hand: Secondary | ICD-10-CM | POA: Diagnosis not present

## 2017-11-21 DIAGNOSIS — I110 Hypertensive heart disease with heart failure: Secondary | ICD-10-CM | POA: Diagnosis not present

## 2017-11-26 DIAGNOSIS — I5033 Acute on chronic diastolic (congestive) heart failure: Secondary | ICD-10-CM | POA: Diagnosis not present

## 2017-11-26 DIAGNOSIS — M19042 Primary osteoarthritis, left hand: Secondary | ICD-10-CM | POA: Diagnosis not present

## 2017-11-26 DIAGNOSIS — M19041 Primary osteoarthritis, right hand: Secondary | ICD-10-CM | POA: Diagnosis not present

## 2017-11-26 DIAGNOSIS — I110 Hypertensive heart disease with heart failure: Secondary | ICD-10-CM | POA: Diagnosis not present

## 2017-11-26 DIAGNOSIS — M48061 Spinal stenosis, lumbar region without neurogenic claudication: Secondary | ICD-10-CM | POA: Diagnosis not present

## 2017-11-26 DIAGNOSIS — S8262XD Displaced fracture of lateral malleolus of left fibula, subsequent encounter for closed fracture with routine healing: Secondary | ICD-10-CM | POA: Diagnosis not present

## 2017-11-27 DIAGNOSIS — M48061 Spinal stenosis, lumbar region without neurogenic claudication: Secondary | ICD-10-CM | POA: Diagnosis not present

## 2017-11-27 DIAGNOSIS — S8262XD Displaced fracture of lateral malleolus of left fibula, subsequent encounter for closed fracture with routine healing: Secondary | ICD-10-CM | POA: Diagnosis not present

## 2017-11-27 DIAGNOSIS — M19041 Primary osteoarthritis, right hand: Secondary | ICD-10-CM | POA: Diagnosis not present

## 2017-11-27 DIAGNOSIS — M19042 Primary osteoarthritis, left hand: Secondary | ICD-10-CM | POA: Diagnosis not present

## 2017-11-27 DIAGNOSIS — I110 Hypertensive heart disease with heart failure: Secondary | ICD-10-CM | POA: Diagnosis not present

## 2017-11-27 DIAGNOSIS — I5033 Acute on chronic diastolic (congestive) heart failure: Secondary | ICD-10-CM | POA: Diagnosis not present

## 2017-11-28 DIAGNOSIS — M48061 Spinal stenosis, lumbar region without neurogenic claudication: Secondary | ICD-10-CM | POA: Diagnosis not present

## 2017-11-28 DIAGNOSIS — I110 Hypertensive heart disease with heart failure: Secondary | ICD-10-CM | POA: Diagnosis not present

## 2017-11-28 DIAGNOSIS — M19042 Primary osteoarthritis, left hand: Secondary | ICD-10-CM | POA: Diagnosis not present

## 2017-11-28 DIAGNOSIS — S8262XD Displaced fracture of lateral malleolus of left fibula, subsequent encounter for closed fracture with routine healing: Secondary | ICD-10-CM | POA: Diagnosis not present

## 2017-11-28 DIAGNOSIS — I5033 Acute on chronic diastolic (congestive) heart failure: Secondary | ICD-10-CM | POA: Diagnosis not present

## 2017-11-28 DIAGNOSIS — M19041 Primary osteoarthritis, right hand: Secondary | ICD-10-CM | POA: Diagnosis not present

## 2017-12-04 DIAGNOSIS — M48061 Spinal stenosis, lumbar region without neurogenic claudication: Secondary | ICD-10-CM | POA: Diagnosis not present

## 2017-12-04 DIAGNOSIS — M19042 Primary osteoarthritis, left hand: Secondary | ICD-10-CM | POA: Diagnosis not present

## 2017-12-04 DIAGNOSIS — M19041 Primary osteoarthritis, right hand: Secondary | ICD-10-CM | POA: Diagnosis not present

## 2017-12-04 DIAGNOSIS — S8262XD Displaced fracture of lateral malleolus of left fibula, subsequent encounter for closed fracture with routine healing: Secondary | ICD-10-CM | POA: Diagnosis not present

## 2017-12-04 DIAGNOSIS — M25572 Pain in left ankle and joints of left foot: Secondary | ICD-10-CM | POA: Diagnosis not present

## 2017-12-04 DIAGNOSIS — I5033 Acute on chronic diastolic (congestive) heart failure: Secondary | ICD-10-CM | POA: Diagnosis not present

## 2017-12-04 DIAGNOSIS — I110 Hypertensive heart disease with heart failure: Secondary | ICD-10-CM | POA: Diagnosis not present

## 2017-12-05 DIAGNOSIS — M19042 Primary osteoarthritis, left hand: Secondary | ICD-10-CM | POA: Diagnosis not present

## 2017-12-05 DIAGNOSIS — M19041 Primary osteoarthritis, right hand: Secondary | ICD-10-CM | POA: Diagnosis not present

## 2017-12-05 DIAGNOSIS — S8262XD Displaced fracture of lateral malleolus of left fibula, subsequent encounter for closed fracture with routine healing: Secondary | ICD-10-CM | POA: Diagnosis not present

## 2017-12-05 DIAGNOSIS — M48061 Spinal stenosis, lumbar region without neurogenic claudication: Secondary | ICD-10-CM | POA: Diagnosis not present

## 2017-12-05 DIAGNOSIS — I110 Hypertensive heart disease with heart failure: Secondary | ICD-10-CM | POA: Diagnosis not present

## 2017-12-05 DIAGNOSIS — I5033 Acute on chronic diastolic (congestive) heart failure: Secondary | ICD-10-CM | POA: Diagnosis not present

## 2017-12-06 DIAGNOSIS — I110 Hypertensive heart disease with heart failure: Secondary | ICD-10-CM | POA: Diagnosis not present

## 2017-12-06 DIAGNOSIS — M48061 Spinal stenosis, lumbar region without neurogenic claudication: Secondary | ICD-10-CM | POA: Diagnosis not present

## 2017-12-06 DIAGNOSIS — I5033 Acute on chronic diastolic (congestive) heart failure: Secondary | ICD-10-CM | POA: Diagnosis not present

## 2017-12-06 DIAGNOSIS — M19041 Primary osteoarthritis, right hand: Secondary | ICD-10-CM | POA: Diagnosis not present

## 2017-12-06 DIAGNOSIS — S8262XD Displaced fracture of lateral malleolus of left fibula, subsequent encounter for closed fracture with routine healing: Secondary | ICD-10-CM | POA: Diagnosis not present

## 2017-12-06 DIAGNOSIS — M19042 Primary osteoarthritis, left hand: Secondary | ICD-10-CM | POA: Diagnosis not present

## 2017-12-07 DIAGNOSIS — Z9889 Other specified postprocedural states: Secondary | ICD-10-CM | POA: Diagnosis not present

## 2017-12-11 DIAGNOSIS — M48061 Spinal stenosis, lumbar region without neurogenic claudication: Secondary | ICD-10-CM | POA: Diagnosis not present

## 2017-12-11 DIAGNOSIS — M19042 Primary osteoarthritis, left hand: Secondary | ICD-10-CM | POA: Diagnosis not present

## 2017-12-11 DIAGNOSIS — I110 Hypertensive heart disease with heart failure: Secondary | ICD-10-CM | POA: Diagnosis not present

## 2017-12-11 DIAGNOSIS — M19041 Primary osteoarthritis, right hand: Secondary | ICD-10-CM | POA: Diagnosis not present

## 2017-12-11 DIAGNOSIS — I5033 Acute on chronic diastolic (congestive) heart failure: Secondary | ICD-10-CM | POA: Diagnosis not present

## 2017-12-11 DIAGNOSIS — S8262XD Displaced fracture of lateral malleolus of left fibula, subsequent encounter for closed fracture with routine healing: Secondary | ICD-10-CM | POA: Diagnosis not present

## 2017-12-12 DIAGNOSIS — M19042 Primary osteoarthritis, left hand: Secondary | ICD-10-CM | POA: Diagnosis not present

## 2017-12-12 DIAGNOSIS — S8262XD Displaced fracture of lateral malleolus of left fibula, subsequent encounter for closed fracture with routine healing: Secondary | ICD-10-CM | POA: Diagnosis not present

## 2017-12-12 DIAGNOSIS — I110 Hypertensive heart disease with heart failure: Secondary | ICD-10-CM | POA: Diagnosis not present

## 2017-12-12 DIAGNOSIS — M19041 Primary osteoarthritis, right hand: Secondary | ICD-10-CM | POA: Diagnosis not present

## 2017-12-12 DIAGNOSIS — I5033 Acute on chronic diastolic (congestive) heart failure: Secondary | ICD-10-CM | POA: Diagnosis not present

## 2017-12-12 DIAGNOSIS — M48061 Spinal stenosis, lumbar region without neurogenic claudication: Secondary | ICD-10-CM | POA: Diagnosis not present

## 2017-12-14 DIAGNOSIS — S8262XD Displaced fracture of lateral malleolus of left fibula, subsequent encounter for closed fracture with routine healing: Secondary | ICD-10-CM | POA: Diagnosis not present

## 2017-12-14 DIAGNOSIS — M48061 Spinal stenosis, lumbar region without neurogenic claudication: Secondary | ICD-10-CM | POA: Diagnosis not present

## 2017-12-14 DIAGNOSIS — I110 Hypertensive heart disease with heart failure: Secondary | ICD-10-CM | POA: Diagnosis not present

## 2017-12-14 DIAGNOSIS — M19041 Primary osteoarthritis, right hand: Secondary | ICD-10-CM | POA: Diagnosis not present

## 2017-12-14 DIAGNOSIS — I5033 Acute on chronic diastolic (congestive) heart failure: Secondary | ICD-10-CM | POA: Diagnosis not present

## 2017-12-14 DIAGNOSIS — M19042 Primary osteoarthritis, left hand: Secondary | ICD-10-CM | POA: Diagnosis not present

## 2018-01-16 DIAGNOSIS — Z9889 Other specified postprocedural states: Secondary | ICD-10-CM | POA: Diagnosis not present

## 2018-02-12 ENCOUNTER — Other Ambulatory Visit: Payer: Self-pay | Admitting: Family

## 2018-03-05 ENCOUNTER — Telehealth: Payer: Self-pay

## 2018-03-05 NOTE — Telephone Encounter (Signed)
Copied from [place]. Topic: Appointment Scheduling - Scheduling Inquiry for Clinic >> Mar 05, 2018  3:33 PM Synthia Innocent wrote: Reason for CRM: Patient is requesting to schedule AWV.

## 2018-03-06 NOTE — Telephone Encounter (Signed)
Called and scheduled AWV for 03/19/18.

## 2018-03-14 DIAGNOSIS — M62551 Muscle wasting and atrophy, not elsewhere classified, right thigh: Secondary | ICD-10-CM | POA: Diagnosis not present

## 2018-03-14 DIAGNOSIS — R2689 Other abnormalities of gait and mobility: Secondary | ICD-10-CM | POA: Diagnosis not present

## 2018-03-14 DIAGNOSIS — M545 Low back pain: Secondary | ICD-10-CM | POA: Diagnosis not present

## 2018-03-14 DIAGNOSIS — M62552 Muscle wasting and atrophy, not elsewhere classified, left thigh: Secondary | ICD-10-CM | POA: Diagnosis not present

## 2018-03-18 DIAGNOSIS — M62552 Muscle wasting and atrophy, not elsewhere classified, left thigh: Secondary | ICD-10-CM | POA: Diagnosis not present

## 2018-03-18 DIAGNOSIS — M545 Low back pain: Secondary | ICD-10-CM | POA: Diagnosis not present

## 2018-03-18 DIAGNOSIS — R2689 Other abnormalities of gait and mobility: Secondary | ICD-10-CM | POA: Diagnosis not present

## 2018-03-18 DIAGNOSIS — M62551 Muscle wasting and atrophy, not elsewhere classified, right thigh: Secondary | ICD-10-CM | POA: Diagnosis not present

## 2018-03-18 NOTE — Progress Notes (Signed)
Subjective:   [name] is a 77 y.o. female who presents for Medicare Annual (Subsequent) preventive examination.  Review of Systems: No ROS.  Medicare Wellness Visit. Additional risk factors are reflected in the social history. Cardiac Risk Factors include: advanced age (>48men, >17 women);dyslipidemia;hypertension;obesity (BMI >30kg/m2) Sleep patterns: no issues. Naps as needed.  Home Safety/Smoke Alarms: Feels safe in home. Smoke alarms in place. Lives alone w/ dog in 1 story home. Uses cane. Walk in shower with bench and grab bar.   Pt currently doing rehab 3 hrs per week.   Female:        Mammo- hx Double mastectomy      Dexa scan-  utd    CCS- next due 09/2020  Eye- Dr. Satira Sark. Yearly. UTD per pt.    Objective:     Vitals: BP 140/86 (BP Location: Left Arm, Patient Position: Sitting, Cuff Size: Normal)   Pulse 65   Ht 5\' 6"  (1.676 m)   Wt 193 lb 6.4 oz (87.7 kg)   LMP 07/11/1991   SpO2 98%   BMI 31.22 kg/m   Body mass index is 31.22 kg/m.  Advanced Directives 03/19/2018 10/18/2017 07/30/2017 08/10/2016 12/23/2015 09/06/2015 08/25/2015  Does Patient Have a Medical Advance Directive? Yes Yes Yes Yes No Yes Yes  Type of Paramedic of [place];Living will [place];Living will Living will [place];Living will - Healthcare Power of [place];Living will  Does patient want to make changes to medical advance directive? - - No - Patient declined - - - No - Patient declined  Copy of [place] in Chart? Yes Yes - Yes - - No - copy requested  Would patient like information on creating a medical advance directive? - - - - No - patient declined information - -  Pre-existing out of facility DNR order (yellow form or pink MOST form) - - - - - - -    Tobacco Social History   Tobacco Use  Smoking Status Former Smoker  . Packs/day: 0.50  . Years: 10.00  . Pack years: 5.00   . Types: Cigarettes  . Last attempt to quit: 07/14/1974  . Years since quitting: 43.7  Smokeless Tobacco Never Used  Tobacco Comment   quit in 1976 smoked for approx. 10 years     Counseling given: Not Answered Comment: quit in 1976 smoked for approx. 10 years   Clinical Intake: Pain : No/denies pain    Past Medical History:  Diagnosis Date  . Allergic rhinitis   . Ankle fracture    Left, [place]  . Arthritis    "knuckles" (06/17/2012)  . 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 Breast cancer ([place])    "right" (06/17/2012)  . Cataracts, bilateral   . Dyspnea    with exertion  . Exertional dyspnea   . GERD (gastroesophageal reflux disease)   . Hypertension   . Iron deficiency anemia    "hematologist watches it" (06/17/2012)  . Obesity   . OSA treated with BiPAP 03/08/2016  . Osteoarthritis    severe right hip osteoarthritis-s/p hip replacement  . Osteopenia 11/26/2009  . Thyroid disorder    "sees endocrinologist yearly" (06/17/2012)   Past Surgical History:  Procedure Laterality Date  . ANKLE FRACTURE SURGERY Right 01/01/14   Has pins. Procedure performed at [facility]   "bilaterlly" (06/17/2012)  . COLONOSCOPY W/ POLYPECTOMY    . EYE  SURGERY  2011   cataract removal both eyes  . HIP CLOSED REDUCTION  07/26/2012   Procedure: CLOSED MANIPULATION HIP;  Surgeon: Nita Sells, MD;  Location: [facility];  Service: Orthopedics;  Laterality: Right;  . LUMBAR LAMINECTOMY/DECOMPRESSION MICRODISCECTOMY Bilateral 10/25/2017   Procedure: LUMBAR 2-3, LUMBAR 3-4 DECOMPRESSION TIME REQUESTED 4.5 HOURS;  Surgeon: Phylliss Bob, MD;  Location: [facility];  Service: Orthopedics;  Laterality: Bilateral;  . MASTECTOMY  1993?   bilateral mastectomy  . ORIF ANKLE FRACTURE Left 07/30/2017   Procedure: LEFT OPEN REDUCTION INTERNAL FIXATION (ORIF) ANKLE FRACTURE WITH SYDESMOTIC FIXATION;  Surgeon: Dorna Leitz, MD;  Location: [facility];  Service:  Orthopedics;  Laterality: Left;  . TONSILLECTOMY  1949  . TOTAL HIP ARTHROPLASTY  04/2006   right hip   . TOTAL HIP REVISION  06/17/2012   "right" (06/17/2012)  . TOTAL HIP REVISION  06/17/2012   Procedure: TOTAL HIP REVISION;  Surgeon: Kerin Salen, MD;  Location: [facility];  Service: Orthopedics;  Laterality: Right;   Family History  Problem Relation Age of Onset  . Heart failure Father        deceased age 2  . Diabetes Father   . Alcohol abuse Father   . Breast cancer Mother        deceased at age 61 secondary to breast cancer  . Liver disease Sister        Liver failure--deceased  . Dementia Maternal Grandmother   . Colon cancer Neg Hx   . Esophageal cancer Neg Hx   . Rectal cancer Neg Hx   . Stomach cancer Neg Hx    Social History   Socioeconomic History  . Marital status: Widowed    Spouse name: Not on file  . Number of children: Not on file  . Years of education: Not on file  . Highest education level: Not on file  Occupational History  . Not on file  Social Needs  . Financial resource strain: Not on file  . Food insecurity:    Worry: Not on file    Inability: Not on file  . Transportation needs:    Medical: Not on file    Non-medical: Not on file  Tobacco Use  . Smoking status: Former Smoker    Packs/day: 0.50    Years: 10.00    Pack years: 5.00    Types: Cigarettes    Last attempt to quit: 07/14/1974    Years since quitting: 43.7  . Smokeless tobacco: Never Used  . Tobacco comment: quit in 1976 smoked for approx. 10 years  Substance and Sexual Activity  . Alcohol use: Not Currently  . Drug use: No  . Sexual activity: Never  Lifestyle  . Physical activity:    Days per week: Not on file    Minutes per session: Not on file  . Stress: Not on file  Relationships  . Social connections:    Talks on phone: Not on file    Gets together: Not on file    Attends religious service: Not on file    Active member of club or organization: Not on file    Attends  meetings of clubs or organizations: Not on file    Relationship status: Not on file  Other Topics Concern  . Not on file  Social History Narrative   Retired   The patient is married and has one child and two grandchildren that live in the area.     She drinks wine with  dinner.  She quit smoking in 1976, smoked for   approximately 10 years.      Moved to [place] from [place], [place]          Outpatient Encounter Medications as of 03/19/2018  Medication Sig  . bisoprolol (ZEBETA) 10 MG tablet TAKE 2 TABLETS EVERY DAY  . Cholecalciferol (VITAMIN D) 2000 units tablet Take 4,000 Units by mouth daily.  . fexofenadine (ALLEGRA) 180 MG tablet Take 180 mg by mouth daily as needed. For seasonal allergies  . FLUoxetine (PROZAC) 40 MG capsule TAKE 1 CAPSULE BY MOUTH EVERY DAY  . fluticasone (FLONASE) 50 MCG/ACT nasal spray USE 1 SPRAY IN EACH NOSTRIL EVERY DAY (Patient taking differently: Use 1 spray in each nostril once daily as needed for congestion)  . furosemide (LASIX) 20 MG tablet Take 1 tablet every morning, 1 tablet after 3pm and may take 1 tablet every evening as needed for swelling. (Patient taking differently: Take 20 mg by mouth 2 (two) times daily. )  . KLOR-CON M20 20 MEQ tablet TAKE 1 TABLET EVERY DAY  . Lidocaine (ASPERCREME LIDOCAINE) 4 % PTCH Apply 1 patch topically daily as needed (pain).  Marland Kitchen losartan (COZAAR) 100 MG tablet TAKE 1 TABLET EVERY DAY (Patient taking differently: Take 100 mg by mouth once daily in the evening)  . omeprazole (PRILOSEC) 40 MG capsule TAKE 1 CAPSULE (40 MG TOTAL) EVERY DAY  . Polyethyl Glycol-Propyl Glycol (SYSTANE OP) Apply 1 drop to eye daily as needed (dry eyes).  . simvastatin (ZOCOR) 10 MG tablet TAKE 1 TABLET EVERY DAY (Patient taking differently: Take 10 mg by mouth once daily in the evening)  . cloNIDine (CATAPRES) 0.1 MG tablet TAKE 1 TABLET TWICE DAILY (Patient not taking: Reported on 03/19/2018)  . gabapentin (NEURONTIN) 100 MG capsule TAKE 1  CAPSULE BY MOUTH THREE TIMES A DAY (Patient not taking: Reported on 03/19/2018)  . [DISCONTINUED] oxyCODONE-acetaminophen (PERCOCET/ROXICET) 5-325 MG tablet Take 1-2 tablets by mouth every 6 (six) hours as needed for severe pain. (Patient not taking: Reported on 10/16/2017)   No facility-administered encounter medications on file as of 03/19/2018.     Activities of Daily Living In your present state of health, do you have any difficulty performing the following activities: 03/19/2018 10/18/2017  Hearing? N N  Vision? N N  Difficulty concentrating or making decisions? N N  Walking or climbing stairs? Y N  Comment -  does not have any to climb  Dressing or bathing? N -  Doing errands, shopping? N Y  Comment - needs a walker to ambulate  Preparing Food and eating ? N -  Using the Toilet? N -  In the past six months, have you accidently leaked urine? Y -  Do you have problems with loss of bowel control? N -  Managing your Medications? N -  Managing your Finances? N -  Some recent data might be hidden    Patient Care Team: Debbrah Alar, NP as PCP - General (Internal Medicine) Marin Olp Rudell Cobb, MD as Consulting Physician (Hematology and Oncology) Marygrace Drought, MD as Consulting Physician (Ophthalmology) Frederik Pear, MD as Consulting Physician (Orthopedic Surgery) Debara Pickett Nadean Corwin, MD as Consulting Physician (Cardiology) Troy Sine, MD as Consulting Physician (Cardiology) Phylliss Bob, MD as Consulting Physician (Orthopedic Surgery)    Assessment:   This is a routine wellness examination for [place]. Physical assessment deferred to PCP.  Exercise Activities and Dietary recommendations Current Exercise Habits: Home exercise routine(rehab exercises daily for 41min per daiy.), Time (  Minutes): 15, Frequency (Times/Week): 7, Weekly Exercise (Minutes/Week): 105, Intensity: Mild, Exercise limited by: None identified Diet (meal preparation, eat out, water intake, caffeinated  beverages, dairy products, fruits and vegetables): in general, a "healthy" diet  , on average, 3 meals per day    Goals    . Increase physical activity     Increase physical strength    . Lose 20 lbs by next year.   (pt-stated)     Increase physical activity----[employer]ne[employer] at the [employer]        Fall Risk Fall Risk  03/19/2018 08/10/2016 08/06/2015 06/24/2014 03/05/2013  Falls in the past year? Yes Yes Yes Yes No  Comment - - Lost balance; walks with a cane - -  Number falls in past yr: 1 1 1 2  or more -  Injury with Fall? Yes - Yes - -  Risk for fall due to : - History of fall(s);Impaired balance/gait Impaired balance/gait;Impaired mobility History of fall(s);Impaired mobility History of fall(s)  Follow up Education provided;Falls prevention discussed Falls prevention discussed Education provided;Falls prevention discussed - -    Depression Screen PHQ 2/9 Scores 03/19/2018 08/10/2016 08/06/2015 06/24/2014  PHQ - 2 Score 1 4 1 6   PHQ- 9 Score - 10 - 9     Cognitive Function Ad8 score reviewed for issues:  Issues making decisions:no  Less interest in hobbies / activities:no  Repeats questions, stories (family complaining):no  Trouble using ordinary gadgets (microwave, computer, phone):no  Forgets the month or year: no  Mismanaging finances: no  Remembering appts:no  Daily problems with thinking and/or memory:no Ad8 score is=0    MMSE - Mini Mental State Exam 08/10/2016 08/06/2015  Orientation to time 5 5  Orientation to Place 5 5  Registration 3 3  Attention/ Calculation 5 5  Recall 3 3  Language- name 2 objects 2 2  Language- repeat 1 1  Language- follow 3 step command 3 3  Language- read & follow direction 1 1  Write a sentence 1 1  Copy design 1 1  Total score 30 30        Immunization History  Administered Date(s) Administered  . Influenza Split 03/22/2012  . Influenza Whole 04/28/2008, 05/04/2009  . Influenza, High Dose Seasonal PF  04/01/2015, 03/27/2016, 03/13/2017  . Influenza,inj,Quad PF,6+ Mos 03/19/2013, 03/25/2014  . Pneumococcal Conjugate-13 06/25/2013  . Pneumococcal Polysaccharide-23 05/27/2001, 06/18/2012  . Td 05/26/2008  . Zoster 12/01/2010    Screening Tests Health Maintenance  Topic Date Due  . INFLUENZA VACCINE  02/07/2018  . TETANUS/TDAP  05/26/2018  . COLONOSCOPY  09/05/2020  . DEXA SCAN  Completed  . PNA vac Low Risk Adult  Completed    Plan:     Please schedule your next medicare wellness visit with me in 1 yr.  Please schedule appt to follow up with [name], NP  Continue to eat heart healthy diet (full of fruits, vegetables, whole grains, lean protein, water--limit salt, fat, and sugar intake) and increase physical activity as tolerated.     I have personally reviewed and noted the following in the patient's chart:   . Medical and social history . Use of alcohol, tobacco or illicit drugs  . Current medications and supplements . Functional ability and status . Nutritional status . Physical activity . Advanced directives . List of other physicians . Hospitalizations, surgeries, and ER visits in previous 12 months . Vitals . Screenings to include cognitive, depression, and falls . Referrals and appointments  In addition,  I have reviewed and discussed with patient certain preventive protocols, quality metrics, and best practice recommendations. A written personalized care plan for preventive services as well as general preventive health recommendations were provided to patient.     Shela Nevin, [place]  03/19/2018

## 2018-03-19 ENCOUNTER — Encounter: Payer: Self-pay | Admitting: *Deleted

## 2018-03-19 ENCOUNTER — Ambulatory Visit (INDEPENDENT_AMBULATORY_CARE_PROVIDER_SITE_OTHER): Payer: Medicare Other | Admitting: *Deleted

## 2018-03-19 VITALS — BP 140/86 | HR 65 | Ht 66.0 in | Wt 193.4 lb

## 2018-03-19 DIAGNOSIS — Z0000 Encounter for general adult medical examination without abnormal findings: Secondary | ICD-10-CM

## 2018-03-19 NOTE — Patient Instructions (Signed)
Please schedule your next medicare wellness visit with me in 1 yr.  Please schedule appt to follow up with Courtney Sciara, NP  Continue to eat heart healthy diet (full of fruits, vegetables, whole grains, lean protein, water--limit salt, fat, and sugar intake) and increase physical activity as tolerated.   [name] , Thank you for taking time to come for your Medicare Wellness Visit. I appreciate your ongoing commitment to your health goals. Please review the following plan we discussed and let me know if I can assist you in the future.   These are the goals we discussed: Goals    . Increase physical activity     Increase physical strength    . Lose 20 lbs by next year.   (pt-stated)     Increase physical activity----[employer] at the [employer]        This is a list of the screening recommended for you and due dates:  Health Maintenance  Topic Date Due  . Flu Shot  02/07/2018  . Tetanus Vaccine  05/26/2018  . Colon Cancer Screening  09/05/2020  . DEXA scan (bone density measurement)  Completed  . Pneumonia vaccines  Completed    Health Maintenance for Postmenopausal Women Menopause is a normal process in which your reproductive ability comes to an end. This process happens gradually over a span of months to years, usually between the ages of 31 and 49. Menopause is complete when you have missed 12 consecutive menstrual periods. It is important to talk with your health care provider about some of the most common conditions that affect postmenopausal women, such as heart disease, cancer, and bone loss (osteoporosis). Adopting a healthy lifestyle and getting preventive care can help to promote your health and wellness. Those actions can also lower your chances of developing some of these common conditions. What should I know about menopause? During menopause, you may experience a number of symptoms, such as:  Moderate-to-severe hot flashes.  Night sweats.  Decrease in sex  drive.  Mood swings.  Headaches.  Tiredness.  Irritability.  Memory problems.  Insomnia.  Choosing to treat or not to treat menopausal changes is an individual decision that you make with your health care provider. What should I know about hormone replacement therapy and supplements? Hormone therapy products are effective for treating symptoms that are associated with menopause, such as hot flashes and night sweats. Hormone replacement carries certain risks, especially as you become older. If you are thinking about using estrogen or estrogen with progestin treatments, discuss the benefits and risks with your health care provider. What should I know about heart disease and stroke? Heart disease, heart attack, and stroke become more likely as you age. This may be due, in part, to the hormonal changes that your body experiences during menopause. These can affect how your body processes dietary fats, triglycerides, and cholesterol. Heart attack and stroke are both medical emergencies. There are many things that you can do to help prevent heart disease and stroke:  Have your blood pressure checked at least every 1-2 years. High blood pressure causes heart disease and increases the risk of stroke.  If you are 63-58 years old, ask your health care provider if you should take aspirin to prevent a heart attack or a stroke.  Do not use any tobacco products, including cigarettes, chewing tobacco, or electronic cigarettes. If you need help quitting, ask your health care provider.  It is important to eat a healthy diet and maintain a  healthy weight. ? Be sure to include plenty of vegetables, fruits, low-fat dairy products, and lean protein. ? Avoid eating foods that are high in solid fats, added sugars, or salt (sodium).  Get regular exercise. This is one of the most important things that you can do for your health. ? Try to exercise for at least 150 minutes each week. The type of exercise that  you do should increase your heart rate and make you sweat. This is known as moderate-intensity exercise. ? Try to do strengthening exercises at least twice each week. Do these in addition to the moderate-intensity exercise.  Know your numbers.Ask your health care provider to check your cholesterol and your blood glucose. Continue to have your blood tested as directed by your health care provider.  What should I know about cancer screening? There are several types of cancer. Take the following steps to reduce your risk and to catch any cancer development as early as possible. Breast Cancer  Practice breast self-awareness. ? This means understanding how your breasts normally appear and feel. ? It also means doing regular breast self-exams. Let your health care provider know about any changes, no matter how small.  If you are 50 or older, have a clinician do a breast exam (clinical breast exam or CBE) every year. Depending on your age, family history, and medical history, it may be recommended that you also have a yearly breast X-ray (mammogram).  If you have a family history of breast cancer, talk with your health care provider about genetic screening.  If you are at high risk for breast cancer, talk with your health care provider about having an MRI and a mammogram every year.  Breast cancer (BRCA) gene test is recommended for women who have family members with BRCA-related cancers. Results of the assessment will determine the need for genetic counseling and BRCA1 and for BRCA2 testing. BRCA-related cancers include these types: ? Breast. This occurs in males or females. ? Ovarian. ? Tubal. This may also be called fallopian tube cancer. ? Cancer of the abdominal or pelvic lining (peritoneal cancer). ? Prostate. ? Pancreatic.  Cervical, Uterine, and Ovarian Cancer Your health care provider may recommend that you be screened regularly for cancer of the pelvic organs. These include your  ovaries, uterus, and vagina. This screening involves a pelvic exam, which includes checking for microscopic changes to the surface of your cervix (Pap test).  For women ages 21-65, health care providers may recommend a pelvic exam and a Pap test every three years. For women ages 62-65, they may recommend the Pap test and pelvic exam, combined with testing for human papilloma virus (HPV), every five years. Some types of HPV increase your risk of cervical cancer. Testing for HPV may also be done on women of any age who have unclear Pap test results.  Other health care providers may not recommend any screening for nonpregnant women who are considered low risk for pelvic cancer and have no symptoms. Ask your health care provider if a screening pelvic exam is right for you.  If you have had past treatment for cervical cancer or a condition that could lead to cancer, you need Pap tests and screening for cancer for at least 20 years after your treatment. If Pap tests have been discontinued for you, your risk factors (such as having a new sexual partner) need to be reassessed to determine if you should start having screenings again. Some women have medical problems that increase the chance of  getting cervical cancer. In these cases, your health care provider may recommend that you have screening and Pap tests more often.  If you have a family history of uterine cancer or ovarian cancer, talk with your health care provider about genetic screening.  If you have vaginal bleeding after reaching menopause, tell your health care provider.  There are currently no reliable tests available to screen for ovarian cancer.  Lung Cancer Lung cancer screening is recommended for adults 71-21 years old who are at high risk for lung cancer because of a history of smoking. A yearly low-dose CT scan of the lungs is recommended if you:  Currently smoke.  Have a history of at least 30 pack-years of smoking and you currently  smoke or have quit within the past 15 years. A pack-year is smoking an average of one pack of cigarettes per day for one year.  Yearly screening should:  Continue until it has been 15 years since you quit.  Stop if you develop a health problem that would prevent you from having lung cancer treatment.  Colorectal Cancer  This type of cancer can be detected and can often be prevented.  Routine colorectal cancer screening usually begins at age 63 and continues through age 39.  If you have risk factors for colon cancer, your health care provider may recommend that you be screened at an earlier age.  If you have a family history of colorectal cancer, talk with your health care provider about genetic screening.  Your health care provider may also recommend using home test kits to check for hidden blood in your stool.  A small camera at the end of a tube can be used to examine your colon directly (sigmoidoscopy or colonoscopy). This is done to check for the earliest forms of colorectal cancer.  Direct examination of the colon should be repeated every 5-10 years until age 68. However, if early forms of precancerous polyps or small growths are found or if you have a family history or genetic risk for colorectal cancer, you may need to be screened more often.  Skin Cancer  Check your skin from head to toe regularly.  Monitor any moles. Be sure to tell your health care provider: ? About any new moles or changes in moles, especially if there is a change in a mole's shape or color. ? If you have a mole that is larger than the size of a pencil eraser.  If any of your family members has a history of skin cancer, especially at a young age, talk with your health care provider about genetic screening.  Always use sunscreen. Apply sunscreen liberally and repeatedly throughout the day.  Whenever you are outside, protect yourself by wearing long sleeves, pants, a wide-brimmed hat, and  sunglasses.  What should I know about osteoporosis? Osteoporosis is a condition in which bone destruction happens more quickly than new bone creation. After menopause, you may be at an increased risk for osteoporosis. To help prevent osteoporosis or the bone fractures that can happen because of osteoporosis, the following is recommended:  If you are 23-35 years old, get at least 1,000 mg of calcium and at least 600 mg of vitamin D per day.  If you are older than age 50 but younger than age 30, get at least 1,200 mg of calcium and at least 600 mg of vitamin D per day.  If you are older than age 67, get at least 1,200 mg of calcium and at least 800  mg of vitamin D per day.  Smoking and excessive alcohol intake increase the risk of osteoporosis. Eat foods that are rich in calcium and vitamin D, and do weight-bearing exercises several times each week as directed by your health care provider. What should I know about how menopause affects my mental health? Depression may occur at any age, but it is more common as you become older. Common symptoms of depression include:  Low or sad mood.  Changes in sleep patterns.  Changes in appetite or eating patterns.  Feeling an overall lack of motivation or enjoyment of activities that you previously enjoyed.  Frequent crying spells.  Talk with your health care provider if you think that you are experiencing depression. What should I know about immunizations? It is important that you get and maintain your immunizations. These include:  Tetanus, diphtheria, and pertussis (Tdap) booster vaccine.  Influenza every year before the flu season begins.  Pneumonia vaccine.  Shingles vaccine.  Your health care provider may also recommend other immunizations. This information is not intended to replace advice given to you by your health care provider. Make sure you discuss any questions you have with your health care provider. Document Released:  08/18/2005 Document Revised: 01/14/2016 Document Reviewed: 03/30/2015 Elsevier Interactive Patient Education  2018 [employer].

## 2018-03-19 NOTE — Progress Notes (Signed)
RN note reviewed and agree.  Cristle Jared S O'Sullivan NP 

## 2018-03-20 DIAGNOSIS — M545 Low back pain: Secondary | ICD-10-CM | POA: Diagnosis not present

## 2018-03-20 DIAGNOSIS — M62551 Muscle wasting and atrophy, not elsewhere classified, right thigh: Secondary | ICD-10-CM | POA: Diagnosis not present

## 2018-03-20 DIAGNOSIS — M62552 Muscle wasting and atrophy, not elsewhere classified, left thigh: Secondary | ICD-10-CM | POA: Diagnosis not present

## 2018-03-20 DIAGNOSIS — R2689 Other abnormalities of gait and mobility: Secondary | ICD-10-CM | POA: Diagnosis not present

## 2018-03-22 DIAGNOSIS — M62551 Muscle wasting and atrophy, not elsewhere classified, right thigh: Secondary | ICD-10-CM | POA: Diagnosis not present

## 2018-03-22 DIAGNOSIS — M545 Low back pain: Secondary | ICD-10-CM | POA: Diagnosis not present

## 2018-03-22 DIAGNOSIS — R2689 Other abnormalities of gait and mobility: Secondary | ICD-10-CM | POA: Diagnosis not present

## 2018-03-22 DIAGNOSIS — M62552 Muscle wasting and atrophy, not elsewhere classified, left thigh: Secondary | ICD-10-CM | POA: Diagnosis not present

## 2018-03-25 DIAGNOSIS — M62551 Muscle wasting and atrophy, not elsewhere classified, right thigh: Secondary | ICD-10-CM | POA: Diagnosis not present

## 2018-03-25 DIAGNOSIS — M62552 Muscle wasting and atrophy, not elsewhere classified, left thigh: Secondary | ICD-10-CM | POA: Diagnosis not present

## 2018-03-25 DIAGNOSIS — M545 Low back pain: Secondary | ICD-10-CM | POA: Diagnosis not present

## 2018-03-25 DIAGNOSIS — R2689 Other abnormalities of gait and mobility: Secondary | ICD-10-CM | POA: Diagnosis not present

## 2018-03-27 DIAGNOSIS — M545 Low back pain: Secondary | ICD-10-CM | POA: Diagnosis not present

## 2018-03-27 DIAGNOSIS — M62551 Muscle wasting and atrophy, not elsewhere classified, right thigh: Secondary | ICD-10-CM | POA: Diagnosis not present

## 2018-03-27 DIAGNOSIS — R2689 Other abnormalities of gait and mobility: Secondary | ICD-10-CM | POA: Diagnosis not present

## 2018-03-27 DIAGNOSIS — M62552 Muscle wasting and atrophy, not elsewhere classified, left thigh: Secondary | ICD-10-CM | POA: Diagnosis not present

## 2018-03-29 DIAGNOSIS — R2689 Other abnormalities of gait and mobility: Secondary | ICD-10-CM | POA: Diagnosis not present

## 2018-03-29 DIAGNOSIS — M545 Low back pain: Secondary | ICD-10-CM | POA: Diagnosis not present

## 2018-03-29 DIAGNOSIS — M62551 Muscle wasting and atrophy, not elsewhere classified, right thigh: Secondary | ICD-10-CM | POA: Diagnosis not present

## 2018-03-29 DIAGNOSIS — M62552 Muscle wasting and atrophy, not elsewhere classified, left thigh: Secondary | ICD-10-CM | POA: Diagnosis not present

## 2018-04-01 DIAGNOSIS — M62552 Muscle wasting and atrophy, not elsewhere classified, left thigh: Secondary | ICD-10-CM | POA: Diagnosis not present

## 2018-04-01 DIAGNOSIS — M545 Low back pain: Secondary | ICD-10-CM | POA: Diagnosis not present

## 2018-04-01 DIAGNOSIS — R2689 Other abnormalities of gait and mobility: Secondary | ICD-10-CM | POA: Diagnosis not present

## 2018-04-01 DIAGNOSIS — M62551 Muscle wasting and atrophy, not elsewhere classified, right thigh: Secondary | ICD-10-CM | POA: Diagnosis not present

## 2018-04-02 ENCOUNTER — Encounter: Payer: Self-pay | Admitting: Family

## 2018-04-02 ENCOUNTER — Ambulatory Visit (INDEPENDENT_AMBULATORY_CARE_PROVIDER_SITE_OTHER): Payer: Medicare Other | Admitting: Family

## 2018-04-02 VITALS — BP 142/84 | HR 64 | Temp 98.7°F | Resp 16 | Ht 66.0 in | Wt 193.0 lb

## 2018-04-02 DIAGNOSIS — I10 Essential (primary) hypertension: Secondary | ICD-10-CM | POA: Diagnosis not present

## 2018-04-02 DIAGNOSIS — Z23 Encounter for immunization: Secondary | ICD-10-CM

## 2018-04-02 DIAGNOSIS — F419 Anxiety disorder, unspecified: Secondary | ICD-10-CM

## 2018-04-02 DIAGNOSIS — E785 Hyperlipidemia, unspecified: Secondary | ICD-10-CM | POA: Diagnosis not present

## 2018-04-02 MED ORDER — FLUOXETINE HCL 10 MG PO CAPS: 10.0000 mg | ORAL_CAPSULE | Freq: Every day | ORAL | 3 refills | Status: DC

## 2018-04-02 NOTE — Progress Notes (Signed)
Subjective:    Patient ID: [name], female    DOB: 1940-08-25, 77 y.o.   MRN: [id number]  HPI  Patient presents today for follow up.  HTN- maintained on losartan, bisoprolol and lasix.  Stopped clonidine in february BP Readings from Last 3 Encounters:  04/02/18 (!) 142/84  03/19/18 140/86  10/25/17 (!) 151/88   Wt Readings from Last 3 Encounters:  04/02/18 193 lb (87.5 kg)  03/19/18 193 lb 6.4 oz (87.7 kg)  10/25/17 224 lb (101.6 kg)   Depression- reports that she is using fluoxetine 1-2 times a week, only if she feels anxious. Reprots that her mood has been good.    Hyperlipidemia- maintained on simvastatin-   Lab Results  Component Value Date   CHOL 160 03/27/2016   HDL 64.90 03/27/2016   LDLCALC 80 03/27/2016   LDLDIRECT 110.4 12/13/2006   TRIG 78.0 03/27/2016   CHOLHDL 2 03/27/2016   Since her last visit she underwent L2/3 and L3/4 laminectomy with bilateral partial facetectomy and bilateral foraminotomy on 10/25/17. She also had a traumatic closed displaced fracture of lateral malleolus of left fibula back in January. She has had a lot of help from her neighbor and has lost a lot of weight. She is happy to be walking and driving now.  Reports pain is stable.   Review of Systems    see HPI  Past Medical History:  Diagnosis Date  . Allergic rhinitis   . Ankle fracture    Left, [place]  . Arthritis    "knuckles" (06/17/2012)  . 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 Breast cancer ([place])    "right" (06/17/2012)  . Cataracts, bilateral   . Dyspnea    with exertion  . Exertional dyspnea   . GERD (gastroesophageal reflux disease)   . Hypertension   . Iron deficiency anemia    "hematologist watches it" (06/17/2012)  . Obesity   . OSA treated with BiPAP 03/08/2016  . Osteoarthritis    severe right hip osteoarthritis-s/p hip replacement  . Osteopenia 11/26/2009  . Thyroid disorder    "sees endocrinologist yearly" (06/17/2012)     Social  History   Socioeconomic History  . Marital status: Widowed    Spouse name: Not on file  . Number of children: Not on file  . Years of education: Not on file  . Highest education level: Not on file  Occupational History  . Not on file  Social Needs  . Financial resource strain: Not on file  . Food insecurity:    Worry: Not on file    Inability: Not on file  . Transportation needs:    Medical: Not on file    Non-medical: Not on file  Tobacco Use  . Smoking status: Former Smoker    Packs/day: 0.50    Years: 10.00    Pack years: 5.00    Types: Cigarettes    Last attempt to quit: 07/14/1974    Years since quitting: 43.7  . Smokeless tobacco: Never Used  . Tobacco comment: quit in 1976 smoked for approx. 10 years  Substance and Sexual Activity  . Alcohol use: Not Currently  . Drug use: No  . Sexual activity: Never  Lifestyle  . Physical activity:    Days per week: Not on file    Minutes per session: Not on file  . Stress: Not on file  Relationships  . Social connections:    Talks on phone: Not on file    Gets together: Not  on file    Attends religious service: Not on file    Active member of club or organization: Not on file    Attends meetings of clubs or organizations: Not on file    Relationship status: Not on file  . Intimate partner violence:    Fear of current or ex partner: Not on file    Emotionally abused: Not on file    Physically abused: Not on file    Forced sexual activity: Not on file  Other Topics Concern  . Not on file  Social History Narrative   Retired   The patient is married and has one child and two grandchildren that live in the area.     She drinks wine with dinner.  She quit smoking in 1976, smoked for   approximately 10 years.      Moved to [place] from [place]          Past Surgical History:  Procedure Laterality Date  . ANKLE FRACTURE SURGERY Right 01/01/14   Has pins. Procedure performed at [facility]   "bilaterlly" (06/17/2012)  . COLONOSCOPY W/ POLYPECTOMY    . EYE SURGERY  2011   cataract removal both eyes  . HIP CLOSED REDUCTION  07/26/2012   Procedure: CLOSED MANIPULATION HIP;  Surgeon: Nita Sells, MD;  Location: [facility];  Service: Orthopedics;  Laterality: Right;  . LUMBAR LAMINECTOMY/DECOMPRESSION MICRODISCECTOMY Bilateral 10/25/2017   Procedure: LUMBAR 2-3, LUMBAR 3-4 DECOMPRESSION TIME REQUESTED 4.5 HOURS;  Surgeon: Phylliss Bob, MD;  Location: [facility];  Service: Orthopedics;  Laterality: Bilateral;  . MASTECTOMY  1993?   bilateral mastectomy  . ORIF ANKLE FRACTURE Left 07/30/2017   Procedure: LEFT OPEN REDUCTION INTERNAL FIXATION (ORIF) ANKLE FRACTURE WITH SYDESMOTIC FIXATION;  Surgeon: Dorna Leitz, MD;  Location: [facility];  Service: Orthopedics;  Laterality: Left;  . TONSILLECTOMY  1949  . TOTAL HIP ARTHROPLASTY  04/2006   right hip   . TOTAL HIP REVISION  06/17/2012   "right" (06/17/2012)  . TOTAL HIP REVISION  06/17/2012   Procedure: TOTAL HIP REVISION;  Surgeon: Kerin Salen, MD;  Location: [facility];  Service: Orthopedics;  Laterality: Right;    Family History  Problem Relation Age of Onset  . Heart failure Father        deceased age 32  . Diabetes Father   . Alcohol abuse Father   . Breast cancer Mother        deceased at age 53 secondary to breast cancer  . Liver disease Sister        Liver failure--deceased  . Dementia Maternal Grandmother   . Colon cancer Neg Hx   . Esophageal cancer Neg Hx   . Rectal cancer Neg Hx   . Stomach cancer Neg Hx     Allergies  Allergen Reactions  . Amlodipine Swelling    Swelling   . Sulfonamide Derivatives Swelling    REACTION: Swelling of the face; "eyes swelled shut"  . Chocolate Other (See Comments)    Sinus problems    Current Outpatient Medications on File Prior to Visit  Medication Sig Dispense Refill  . bisoprolol (ZEBETA) 10 MG tablet TAKE 2 TABLETS EVERY DAY 180 tablet 1  . Cholecalciferol  (VITAMIN D) 2000 units tablet Take 4,000 Units by mouth daily.    . fexofenadine (ALLEGRA) 180 MG tablet Take 180 mg by mouth daily as needed. For seasonal allergies    . fluticasone (FLONASE)  50 MCG/ACT nasal spray USE 1 SPRAY IN EACH NOSTRIL EVERY DAY (Patient taking differently: Use 1 spray in each nostril once daily as needed for congestion) 32 g 1  . furosemide (LASIX) 20 MG tablet Take 1 tablet every morning, 1 tablet after 3pm and may take 1 tablet every evening as needed for swelling. (Patient taking differently: Take 20 mg by mouth 2 (two) times daily. ) 270 tablet 1  . KLOR-CON M20 20 MEQ tablet TAKE 1 TABLET EVERY DAY 90 tablet 1  . Lidocaine (ASPERCREME LIDOCAINE) 4 % PTCH Apply 1 patch topically daily as needed (pain).    Marland Kitchen losartan (COZAAR) 100 MG tablet TAKE 1 TABLET EVERY DAY (Patient taking differently: Take 100 mg by mouth once daily in the evening) 90 tablet 1  . meloxicam (MOBIC) 15 MG tablet Take 15 mg by mouth daily.    Marland Kitchen omeprazole (PRILOSEC) 40 MG capsule TAKE 1 CAPSULE (40 MG TOTAL) EVERY DAY 90 capsule 1  . Polyethyl Glycol-Propyl Glycol (SYSTANE OP) Apply 1 drop to eye daily as needed (dry eyes).    . simvastatin (ZOCOR) 10 MG tablet TAKE 1 TABLET EVERY DAY (Patient taking differently: Take 10 mg by mouth once daily in the evening) 90 tablet 1   No current facility-administered medications on file prior to visit.     BP (!) 142/84 (BP Location: Left Arm, Patient Position: Sitting, Cuff Size: Large)   Pulse 64   Temp 98.7 F (37.1 C) (Oral)   Resp 16   Ht 5\' 6"  (1.676 m)   Wt 193 lb (87.5 kg)   LMP 07/11/1991   SpO2 98%   BMI 31.15 kg/m    Objective:   Physical Exam  Constitutional: She is oriented to person, place, and time. She appears well-developed and well-nourished.  Cardiovascular: Normal rate, regular rhythm and normal heart sounds.  No murmur heard. Pulmonary/Chest: Effort normal and breath sounds normal. No respiratory distress. She has no wheezes.   Musculoskeletal: She exhibits no edema.  Neurological: She is alert and oriented to person, place, and time.  Skin: Skin is warm and dry.  Psychiatric: She has a normal mood and affect. Her behavior is normal. Judgment and thought content normal.          Assessment & Plan:  HTN- BP stable off of clonidine. Continue current meds.commended her on her weight loss.   Anxiety- discussed changing prozac from 40mg  1-2 times weekly to 10mg  once daily. She is to let me know if she has any mood issues with this adjustment.   Hyperlipidemia- maintained on simvastatin, check follow up lipid panel.  Flu shot today.

## 2018-04-02 NOTE — Patient Instructions (Addendum)
Please complete lab work prior to leaving. Please change prozac to 10mg  once daily.  Great job with weight loss, keep up the good work!

## 2018-04-03 DIAGNOSIS — M62552 Muscle wasting and atrophy, not elsewhere classified, left thigh: Secondary | ICD-10-CM | POA: Diagnosis not present

## 2018-04-03 DIAGNOSIS — M545 Low back pain: Secondary | ICD-10-CM | POA: Diagnosis not present

## 2018-04-03 DIAGNOSIS — R2689 Other abnormalities of gait and mobility: Secondary | ICD-10-CM | POA: Diagnosis not present

## 2018-04-03 DIAGNOSIS — M62551 Muscle wasting and atrophy, not elsewhere classified, right thigh: Secondary | ICD-10-CM | POA: Diagnosis not present

## 2018-04-03 LAB — LIPID PANEL
Cholesterol: 155 mg/dL (ref 0–200)
HDL: 54.8 mg/dL (ref >39.00)
LDL Cholesterol: 86 mg/dL (ref 0–99)
NONHDL: 100.47
Total CHOL/HDL Ratio: 3
Triglycerides: 74 mg/dL (ref 0.0–149.0)
VLDL: 14.8 mg/dL (ref 0.0–40.0)

## 2018-04-03 LAB — COMPREHENSIVE METABOLIC PANEL
ALK PHOS: 83 U/L (ref 39–117)
ALT: 22 U/L (ref 0–35)
AST: 27 U/L (ref 0–37)
Albumin: 4.3 g/dL (ref 3.5–5.2)
BILIRUBIN TOTAL: 0.8 mg/dL (ref 0.2–1.2)
BUN: 32 mg/dL — AB (ref 6–23)
CO2: 24 mEq/L (ref 19–32)
CREATININE: 1.37 mg/dL — AB (ref 0.40–1.20)
Calcium: 11.1 mg/dL — ABNORMAL HIGH (ref 8.4–10.5)
Chloride: 103 mEq/L (ref 96–112)
GFR: 39.94 mL/min — ABNORMAL LOW (ref >60.00)
GLUCOSE: 104 mg/dL — AB (ref 70–99)
POTASSIUM: 4.6 meq/L (ref 3.5–5.1)
SODIUM: 137 meq/L (ref 135–145)
Total Protein: 7 g/dL (ref 6.0–8.3)

## 2018-04-05 ENCOUNTER — Telehealth: Payer: Self-pay | Admitting: Family

## 2018-04-05 DIAGNOSIS — R2689 Other abnormalities of gait and mobility: Secondary | ICD-10-CM | POA: Diagnosis not present

## 2018-04-05 DIAGNOSIS — M62552 Muscle wasting and atrophy, not elsewhere classified, left thigh: Secondary | ICD-10-CM | POA: Diagnosis not present

## 2018-04-05 DIAGNOSIS — M62551 Muscle wasting and atrophy, not elsewhere classified, right thigh: Secondary | ICD-10-CM | POA: Diagnosis not present

## 2018-04-05 DIAGNOSIS — E213 Hyperparathyroidism, unspecified: Secondary | ICD-10-CM

## 2018-04-05 DIAGNOSIS — M545 Low back pain: Secondary | ICD-10-CM | POA: Diagnosis not present

## 2018-04-05 NOTE — Telephone Encounter (Signed)
Calcium remains quite elevated. Is she seeing endocrinology?  If I would like her to arrange a follow up appointment with them. If not, let me know and I will place consult.   Cholesterol looks good.

## 2018-04-08 DIAGNOSIS — M62552 Muscle wasting and atrophy, not elsewhere classified, left thigh: Secondary | ICD-10-CM | POA: Diagnosis not present

## 2018-04-08 DIAGNOSIS — R2689 Other abnormalities of gait and mobility: Secondary | ICD-10-CM | POA: Diagnosis not present

## 2018-04-08 DIAGNOSIS — M545 Low back pain: Secondary | ICD-10-CM | POA: Diagnosis not present

## 2018-04-08 DIAGNOSIS — M62551 Muscle wasting and atrophy, not elsewhere classified, right thigh: Secondary | ICD-10-CM | POA: Diagnosis not present

## 2018-04-09 NOTE — Telephone Encounter (Signed)
Per patient she does not have an endocrinologist.

## 2018-04-10 ENCOUNTER — Other Ambulatory Visit: Payer: Self-pay | Admitting: Family

## 2018-04-12 DIAGNOSIS — R2689 Other abnormalities of gait and mobility: Secondary | ICD-10-CM | POA: Diagnosis not present

## 2018-04-12 DIAGNOSIS — M545 Low back pain: Secondary | ICD-10-CM | POA: Diagnosis not present

## 2018-04-12 DIAGNOSIS — M62551 Muscle wasting and atrophy, not elsewhere classified, right thigh: Secondary | ICD-10-CM | POA: Diagnosis not present

## 2018-04-12 DIAGNOSIS — M62552 Muscle wasting and atrophy, not elsewhere classified, left thigh: Secondary | ICD-10-CM | POA: Diagnosis not present

## 2018-04-15 DIAGNOSIS — M62551 Muscle wasting and atrophy, not elsewhere classified, right thigh: Secondary | ICD-10-CM | POA: Diagnosis not present

## 2018-04-15 DIAGNOSIS — R2689 Other abnormalities of gait and mobility: Secondary | ICD-10-CM | POA: Diagnosis not present

## 2018-04-15 DIAGNOSIS — M62552 Muscle wasting and atrophy, not elsewhere classified, left thigh: Secondary | ICD-10-CM | POA: Diagnosis not present

## 2018-04-15 DIAGNOSIS — M545 Low back pain: Secondary | ICD-10-CM | POA: Diagnosis not present

## 2018-04-17 DIAGNOSIS — R2689 Other abnormalities of gait and mobility: Secondary | ICD-10-CM | POA: Diagnosis not present

## 2018-04-17 DIAGNOSIS — M545 Low back pain: Secondary | ICD-10-CM | POA: Diagnosis not present

## 2018-04-17 DIAGNOSIS — M62552 Muscle wasting and atrophy, not elsewhere classified, left thigh: Secondary | ICD-10-CM | POA: Diagnosis not present

## 2018-04-17 DIAGNOSIS — M62551 Muscle wasting and atrophy, not elsewhere classified, right thigh: Secondary | ICD-10-CM | POA: Diagnosis not present

## 2018-04-19 DIAGNOSIS — M62551 Muscle wasting and atrophy, not elsewhere classified, right thigh: Secondary | ICD-10-CM | POA: Diagnosis not present

## 2018-04-19 DIAGNOSIS — R2689 Other abnormalities of gait and mobility: Secondary | ICD-10-CM | POA: Diagnosis not present

## 2018-04-19 DIAGNOSIS — M62552 Muscle wasting and atrophy, not elsewhere classified, left thigh: Secondary | ICD-10-CM | POA: Diagnosis not present

## 2018-04-19 DIAGNOSIS — M545 Low back pain: Secondary | ICD-10-CM | POA: Diagnosis not present

## 2018-04-22 DIAGNOSIS — M62551 Muscle wasting and atrophy, not elsewhere classified, right thigh: Secondary | ICD-10-CM | POA: Diagnosis not present

## 2018-04-22 DIAGNOSIS — M62552 Muscle wasting and atrophy, not elsewhere classified, left thigh: Secondary | ICD-10-CM | POA: Diagnosis not present

## 2018-04-22 DIAGNOSIS — M545 Low back pain: Secondary | ICD-10-CM | POA: Diagnosis not present

## 2018-04-22 DIAGNOSIS — R2689 Other abnormalities of gait and mobility: Secondary | ICD-10-CM | POA: Diagnosis not present

## 2018-04-24 DIAGNOSIS — M62551 Muscle wasting and atrophy, not elsewhere classified, right thigh: Secondary | ICD-10-CM | POA: Diagnosis not present

## 2018-04-24 DIAGNOSIS — M62552 Muscle wasting and atrophy, not elsewhere classified, left thigh: Secondary | ICD-10-CM | POA: Diagnosis not present

## 2018-04-24 DIAGNOSIS — R2689 Other abnormalities of gait and mobility: Secondary | ICD-10-CM | POA: Diagnosis not present

## 2018-04-24 DIAGNOSIS — M545 Low back pain: Secondary | ICD-10-CM | POA: Diagnosis not present

## 2018-04-26 DIAGNOSIS — R2689 Other abnormalities of gait and mobility: Secondary | ICD-10-CM | POA: Diagnosis not present

## 2018-04-26 DIAGNOSIS — M545 Low back pain: Secondary | ICD-10-CM | POA: Diagnosis not present

## 2018-04-26 DIAGNOSIS — M62551 Muscle wasting and atrophy, not elsewhere classified, right thigh: Secondary | ICD-10-CM | POA: Diagnosis not present

## 2018-04-26 DIAGNOSIS — M62552 Muscle wasting and atrophy, not elsewhere classified, left thigh: Secondary | ICD-10-CM | POA: Diagnosis not present

## 2018-04-27 ENCOUNTER — Other Ambulatory Visit: Payer: Self-pay | Admitting: Family

## 2018-04-29 DIAGNOSIS — M545 Low back pain: Secondary | ICD-10-CM | POA: Diagnosis not present

## 2018-04-29 DIAGNOSIS — M62552 Muscle wasting and atrophy, not elsewhere classified, left thigh: Secondary | ICD-10-CM | POA: Diagnosis not present

## 2018-04-29 DIAGNOSIS — M62551 Muscle wasting and atrophy, not elsewhere classified, right thigh: Secondary | ICD-10-CM | POA: Diagnosis not present

## 2018-04-29 DIAGNOSIS — R2689 Other abnormalities of gait and mobility: Secondary | ICD-10-CM | POA: Diagnosis not present

## 2018-05-01 DIAGNOSIS — R2689 Other abnormalities of gait and mobility: Secondary | ICD-10-CM | POA: Diagnosis not present

## 2018-05-01 DIAGNOSIS — M545 Low back pain: Secondary | ICD-10-CM | POA: Diagnosis not present

## 2018-05-01 DIAGNOSIS — M62552 Muscle wasting and atrophy, not elsewhere classified, left thigh: Secondary | ICD-10-CM | POA: Diagnosis not present

## 2018-05-01 DIAGNOSIS — M62551 Muscle wasting and atrophy, not elsewhere classified, right thigh: Secondary | ICD-10-CM | POA: Diagnosis not present

## 2018-05-03 DIAGNOSIS — M545 Low back pain: Secondary | ICD-10-CM | POA: Diagnosis not present

## 2018-05-03 DIAGNOSIS — R2689 Other abnormalities of gait and mobility: Secondary | ICD-10-CM | POA: Diagnosis not present

## 2018-05-03 DIAGNOSIS — M62552 Muscle wasting and atrophy, not elsewhere classified, left thigh: Secondary | ICD-10-CM | POA: Diagnosis not present

## 2018-05-03 DIAGNOSIS — M62551 Muscle wasting and atrophy, not elsewhere classified, right thigh: Secondary | ICD-10-CM | POA: Diagnosis not present

## 2018-05-06 DIAGNOSIS — M545 Low back pain: Secondary | ICD-10-CM | POA: Diagnosis not present

## 2018-05-06 DIAGNOSIS — R2689 Other abnormalities of gait and mobility: Secondary | ICD-10-CM | POA: Diagnosis not present

## 2018-05-06 DIAGNOSIS — M62551 Muscle wasting and atrophy, not elsewhere classified, right thigh: Secondary | ICD-10-CM | POA: Diagnosis not present

## 2018-05-06 DIAGNOSIS — M62552 Muscle wasting and atrophy, not elsewhere classified, left thigh: Secondary | ICD-10-CM | POA: Diagnosis not present

## 2018-05-08 DIAGNOSIS — R2689 Other abnormalities of gait and mobility: Secondary | ICD-10-CM | POA: Diagnosis not present

## 2018-05-08 DIAGNOSIS — M62551 Muscle wasting and atrophy, not elsewhere classified, right thigh: Secondary | ICD-10-CM | POA: Diagnosis not present

## 2018-05-08 DIAGNOSIS — M62552 Muscle wasting and atrophy, not elsewhere classified, left thigh: Secondary | ICD-10-CM | POA: Diagnosis not present

## 2018-05-08 DIAGNOSIS — M545 Low back pain: Secondary | ICD-10-CM | POA: Diagnosis not present

## 2018-05-10 DIAGNOSIS — M545 Low back pain: Secondary | ICD-10-CM | POA: Diagnosis not present

## 2018-05-10 DIAGNOSIS — R2689 Other abnormalities of gait and mobility: Secondary | ICD-10-CM | POA: Diagnosis not present

## 2018-05-10 DIAGNOSIS — M62552 Muscle wasting and atrophy, not elsewhere classified, left thigh: Secondary | ICD-10-CM | POA: Diagnosis not present

## 2018-05-10 DIAGNOSIS — M62551 Muscle wasting and atrophy, not elsewhere classified, right thigh: Secondary | ICD-10-CM | POA: Diagnosis not present

## 2018-05-13 DIAGNOSIS — R2689 Other abnormalities of gait and mobility: Secondary | ICD-10-CM | POA: Diagnosis not present

## 2018-05-13 DIAGNOSIS — M545 Low back pain: Secondary | ICD-10-CM | POA: Diagnosis not present

## 2018-05-13 DIAGNOSIS — M62551 Muscle wasting and atrophy, not elsewhere classified, right thigh: Secondary | ICD-10-CM | POA: Diagnosis not present

## 2018-05-13 DIAGNOSIS — M62552 Muscle wasting and atrophy, not elsewhere classified, left thigh: Secondary | ICD-10-CM | POA: Diagnosis not present

## 2018-05-15 DIAGNOSIS — M545 Low back pain: Secondary | ICD-10-CM | POA: Diagnosis not present

## 2018-05-15 DIAGNOSIS — R2689 Other abnormalities of gait and mobility: Secondary | ICD-10-CM | POA: Diagnosis not present

## 2018-05-15 DIAGNOSIS — M62551 Muscle wasting and atrophy, not elsewhere classified, right thigh: Secondary | ICD-10-CM | POA: Diagnosis not present

## 2018-05-15 DIAGNOSIS — M62552 Muscle wasting and atrophy, not elsewhere classified, left thigh: Secondary | ICD-10-CM | POA: Diagnosis not present

## 2018-05-17 DIAGNOSIS — M545 Low back pain: Secondary | ICD-10-CM | POA: Diagnosis not present

## 2018-05-17 DIAGNOSIS — M62552 Muscle wasting and atrophy, not elsewhere classified, left thigh: Secondary | ICD-10-CM | POA: Diagnosis not present

## 2018-05-17 DIAGNOSIS — R2689 Other abnormalities of gait and mobility: Secondary | ICD-10-CM | POA: Diagnosis not present

## 2018-05-17 DIAGNOSIS — M62551 Muscle wasting and atrophy, not elsewhere classified, right thigh: Secondary | ICD-10-CM | POA: Diagnosis not present

## 2018-05-20 DIAGNOSIS — M62551 Muscle wasting and atrophy, not elsewhere classified, right thigh: Secondary | ICD-10-CM | POA: Diagnosis not present

## 2018-05-20 DIAGNOSIS — R2689 Other abnormalities of gait and mobility: Secondary | ICD-10-CM | POA: Diagnosis not present

## 2018-05-20 DIAGNOSIS — M545 Low back pain: Secondary | ICD-10-CM | POA: Diagnosis not present

## 2018-05-20 DIAGNOSIS — M62552 Muscle wasting and atrophy, not elsewhere classified, left thigh: Secondary | ICD-10-CM | POA: Diagnosis not present

## 2018-05-22 DIAGNOSIS — M545 Low back pain: Secondary | ICD-10-CM | POA: Diagnosis not present

## 2018-05-22 DIAGNOSIS — M62552 Muscle wasting and atrophy, not elsewhere classified, left thigh: Secondary | ICD-10-CM | POA: Diagnosis not present

## 2018-05-22 DIAGNOSIS — R2689 Other abnormalities of gait and mobility: Secondary | ICD-10-CM | POA: Diagnosis not present

## 2018-05-22 DIAGNOSIS — M62551 Muscle wasting and atrophy, not elsewhere classified, right thigh: Secondary | ICD-10-CM | POA: Diagnosis not present

## 2018-05-24 DIAGNOSIS — M62552 Muscle wasting and atrophy, not elsewhere classified, left thigh: Secondary | ICD-10-CM | POA: Diagnosis not present

## 2018-05-24 DIAGNOSIS — M545 Low back pain: Secondary | ICD-10-CM | POA: Diagnosis not present

## 2018-05-24 DIAGNOSIS — R2689 Other abnormalities of gait and mobility: Secondary | ICD-10-CM | POA: Diagnosis not present

## 2018-05-24 DIAGNOSIS — M62551 Muscle wasting and atrophy, not elsewhere classified, right thigh: Secondary | ICD-10-CM | POA: Diagnosis not present

## 2018-05-27 DIAGNOSIS — M545 Low back pain: Secondary | ICD-10-CM | POA: Diagnosis not present

## 2018-05-27 DIAGNOSIS — M62551 Muscle wasting and atrophy, not elsewhere classified, right thigh: Secondary | ICD-10-CM | POA: Diagnosis not present

## 2018-05-27 DIAGNOSIS — R2689 Other abnormalities of gait and mobility: Secondary | ICD-10-CM | POA: Diagnosis not present

## 2018-05-27 DIAGNOSIS — M62552 Muscle wasting and atrophy, not elsewhere classified, left thigh: Secondary | ICD-10-CM | POA: Diagnosis not present

## 2018-05-29 DIAGNOSIS — M545 Low back pain: Secondary | ICD-10-CM | POA: Diagnosis not present

## 2018-05-29 DIAGNOSIS — M62551 Muscle wasting and atrophy, not elsewhere classified, right thigh: Secondary | ICD-10-CM | POA: Diagnosis not present

## 2018-05-29 DIAGNOSIS — R2689 Other abnormalities of gait and mobility: Secondary | ICD-10-CM | POA: Diagnosis not present

## 2018-05-29 DIAGNOSIS — M62552 Muscle wasting and atrophy, not elsewhere classified, left thigh: Secondary | ICD-10-CM | POA: Diagnosis not present

## 2018-05-30 ENCOUNTER — Ambulatory Visit (INDEPENDENT_AMBULATORY_CARE_PROVIDER_SITE_OTHER): Payer: Medicare Other | Admitting: Internal Medicine

## 2018-05-30 ENCOUNTER — Encounter: Payer: Self-pay | Admitting: Internal Medicine

## 2018-05-30 ENCOUNTER — Telehealth: Payer: Self-pay

## 2018-05-30 DIAGNOSIS — E673 Hypervitaminosis D: Secondary | ICD-10-CM | POA: Diagnosis not present

## 2018-05-30 LAB — COMPREHENSIVE METABOLIC PANEL
ALBUMIN: 4.3 g/dL (ref 3.5–5.2)
ALT: 21 U/L (ref 0–35)
AST: 28 U/L (ref 0–37)
Alkaline Phosphatase: 73 U/L (ref 39–117)
BILIRUBIN TOTAL: 0.6 mg/dL (ref 0.2–1.2)
BUN: 41 mg/dL — ABNORMAL HIGH (ref 6–23)
CALCIUM: 10.9 mg/dL — AB (ref 8.4–10.5)
CO2: 24 mEq/L (ref 19–32)
CREATININE: 1.61 mg/dL — AB (ref 0.40–1.20)
Chloride: 104 mEq/L (ref 96–112)
GFR: 33.14 mL/min — AB (ref >60.00)
Glucose, Bld: 115 mg/dL — ABNORMAL HIGH (ref 70–99)
Potassium: 4.6 mEq/L (ref 3.5–5.1)
Sodium: 137 mEq/L (ref 135–145)
Total Protein: 7.1 g/dL (ref 6.0–8.3)

## 2018-05-30 LAB — VITAMIN D 25 HYDROXY (VIT D DEFICIENCY, FRACTURES): VITD: 107.19 ng/mL — AB (ref 30.00–100.00)

## 2018-05-30 NOTE — Progress Notes (Signed)
Name: [name]  MRN/ DOB: [id number], Sep 09, 1940    Age/ Sex: 77 y.o., female    PCP: Courtney Alar, NP   Reason for Endocrinology Evaluation:      Date of Initial Endocrinology Evaluation: 05/30/2018     HPI: [name] is a 77 y.o. female with a past medical history of HTN, CHF, SVT, Osteopenia and left foot drop. The patient presented for initial endocrinology clinic visit on 05/30/2018 for consultative assistance with her hypercalcemia.   In review of her records, pt was noted to have intermittent hypercalcemia since 09/2008. This has become more persistent in April, 2019. Pt also was noted to have an elevated PTH in 2013, 2014 and in 2018.  Pt was evaluated by an endocrinologist  A couple of years ago, pt under the impression this was not something of a worry.  [name] indicates that she was first diagnosed with hypercalcemia in the past 2 years.. Since that time, she denies experienced symptoms of constipation, polyuria, polydipsia, generalized weakness, She does however notice occasional diffuse muscle pains, and mild memory impairment. She denies use of over the counter calcium (including supplements, Tums, Rolaids, or other calcium containing antacids), lithium.   She takes 4000 iu vitamin D supplements. Daily  She has been off HCTZ for ~ 4 yrs.    She denies history of kidney stones, kidney disease, liver disease, granulomatous disease. She denies osteoporosis or prior fractures. Daily dietary calcium intake: 1 servings (cheese). She denies parathyroid disease, thyroid disease.   Maternal grandmother with osteoporosis   HISTORY:  Past Medical History:  Past Medical History:  Diagnosis Date  . Allergic rhinitis   . Ankle fracture    Left, Mortise Widening  . Arthritis    "knuckles" (06/17/2012)  . 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 Breast cancer ([place])    "right" (06/17/2012)  . Cataracts, bilateral   . Dyspnea    with exertion  .  Exertional dyspnea   . GERD (gastroesophageal reflux disease)   . Hypertension   . Iron deficiency anemia    "hematologist watches it" (06/17/2012)  . Obesity   . OSA treated with BiPAP 03/08/2016  . Osteoarthritis    severe right hip osteoarthritis-s/p hip replacement  . Osteopenia 11/26/2009  . Thyroid disorder    "sees endocrinologist yearly" (06/17/2012)   Past Surgical History:  Past Surgical History:  Procedure Laterality Date  . ANKLE FRACTURE SURGERY Right 01/01/14   Has pins. Procedure performed at [place]   "bilaterlly" (06/17/2012)  . COLONOSCOPY W/ POLYPECTOMY    . EYE SURGERY  2011   cataract removal both eyes  . HIP CLOSED REDUCTION  07/26/2012   Procedure: CLOSED MANIPULATION HIP;  Surgeon: Nita Sells, MD;  [facility];  Service: Orthopedics;  Laterality: Right;  . LUMBAR LAMINECTOMY  10/26/2017  . LUMBAR LAMINECTOMY/DECOMPRESSION MICRODISCECTOMY Bilateral 10/25/2017   Procedure: LUMBAR 2-3, LUMBAR 3-4 DECOMPRESSION TIME REQUESTED 4.5 HOURS;  Surgeon: Phylliss Bob, MD;  Location: [place];  Service: Orthopedics;  Laterality: Bilateral;  . MASTECTOMY  1993?   bilateral mastectomy  . ORIF ANKLE FRACTURE Left 07/30/2017   Procedure: LEFT OPEN REDUCTION INTERNAL FIXATION (ORIF) ANKLE FRACTURE WITH SYDESMOTIC FIXATION;  Surgeon: Dorna Leitz, MD;  Location: [facility];  Service: Orthopedics;  Laterality: Left;  . TONSILLECTOMY  1949  . TOTAL HIP ARTHROPLASTY  04/2006   right hip   . TOTAL HIP REVISION  06/17/2012   "right" (  06/17/2012)  . TOTAL HIP REVISION  06/17/2012   Procedure: TOTAL HIP REVISION;  Surgeon: Kerin Salen, MD;  Location: [place];  Service: Orthopedics;  Laterality: Right;      Social History:  reports that she quit smoking about 43 years ago. Her smoking use included cigarettes. She has a 5.00 pack-year smoking history. She has never used smokeless tobacco. She reports that she drank alcohol. She reports that she  does not use drugs.  Family History: family history includes Alcohol abuse in her father; Breast cancer in her mother; Dementia in her maternal grandmother; Diabetes in her father; Heart failure in her father; Liver disease in her sister.   HOME MEDICATIONS: Current Outpatient Medications on File Prior to Visit  Medication Sig Dispense Refill  . bisoprolol (ZEBETA) 10 MG tablet TAKE 2 TABLETS EVERY DAY 180 tablet 1  . Cholecalciferol (VITAMIN D) 2000 units tablet Take 4,000 Units by mouth daily.    . fexofenadine (ALLEGRA) 180 MG tablet Take 180 mg by mouth daily as needed. For seasonal allergies    . FLUoxetine (PROZAC) 10 MG capsule TAKE 1 CAPSULE BY MOUTH EVERY DAY 90 capsule 1  . fluticasone (FLONASE) 50 MCG/ACT nasal spray USE 1 SPRAY IN EACH NOSTRIL EVERY DAY (Patient taking differently: Use 1 spray in each nostril once daily as needed for congestion) 32 g 1  . furosemide (LASIX) 20 MG tablet Take 1 tablet every morning, 1 tablet after 3pm and may take 1 tablet every evening as needed for swelling. (Patient taking differently: Take 20 mg by mouth 2 (two) times daily. ) 270 tablet 1  . Lidocaine (ASPERCREME LIDOCAINE) 4 % PTCH Apply 1 patch topically daily as needed (pain).    Marland Kitchen losartan (COZAAR) 100 MG tablet TAKE 1 TABLET EVERY DAY (Patient taking differently: Take 100 mg by mouth once daily in the evening) 90 tablet 1  . meloxicam (MOBIC) 15 MG tablet Take 15 mg by mouth daily.    Marland Kitchen omeprazole (PRILOSEC) 40 MG capsule TAKE 1 CAPSULE (40 MG TOTAL) EVERY DAY 90 capsule 1  . Polyethyl Glycol-Propyl Glycol (SYSTANE OP) Apply 1 drop to eye daily as needed (dry eyes).    . potassium chloride SA (K-DUR,KLOR-CON) 20 MEQ tablet TAKE 1 TABLET EVERY DAY 90 tablet 1  . simvastatin (ZOCOR) 10 MG tablet TAKE 1 TABLET EVERY DAY (Patient taking differently: Take 10 mg by mouth once daily in the evening) 90 tablet 1   No current facility-administered medications on file prior to visit.       REVIEW  OF SYSTEMS: A comprehensive ROS was conducted with the patient and is negative except as per HPI and below:  Review of Systems  Constitutional: Positive for weight loss. Negative for malaise/fatigue.  HENT: Negative for congestion and sore throat.   Eyes: Negative for blurred vision and pain.  Respiratory: Negative for cough and shortness of breath.   Cardiovascular: Positive for leg swelling. Negative for chest pain.  Gastrointestinal: Positive for constipation. Negative for diarrhea.  Genitourinary: Positive for frequency.  Musculoskeletal: Positive for back pain and joint pain.  Skin: Negative.   Neurological: Negative for tingling and tremors.  Endo/Heme/Allergies: Negative for polydipsia.  Psychiatric/Behavioral: Negative for depression. The patient is not nervous/anxious.        OBJECTIVE:  VS: BP 138/68   Pulse 63   Ht '5\' 6"'$  (1.676 m)   Wt 193 lb 3.2 oz (87.6 kg)   LMP 07/11/1991   SpO2 100%   BMI 31.18  kg/m    Wt Readings from Last 3 Encounters:  05/30/18 193 lb 3.2 oz (87.6 kg)  04/02/18 193 lb (87.5 kg)  03/19/18 193 lb 6.4 oz (87.7 kg)     EXAM: General: Pt appears well and is in NAD  Hydration: Well-hydrated with moist mucous membranes and good skin turgor  Eyes: External eye exam normal without stare, lid lag or exophthalmos.  EOM intact.   Ears, Nose, Throat: Hearing: Grossly intact bilaterally Throat: Clear without mass, erythema or exudate  Neck: General: Supple without adenopathy. Thyroid: Thyroid size normal.  No goiter or nodules appreciated. No thyroid bruit.  Lungs: Clear with good BS bilat with no rales, rhonchi, or wheezes  Heart: Auscultation: RRR.  Abdomen: Soft, nontender, without masses or organomegaly palpable  Extremities: BL LE: No pretibial edema normal ROM and strength.  Skin: Hair: Texture and amount normal with gender appropriate distribution Skin Inspection: No rashes. Skin Palpation: Skin temperature, texture, and thickness normal  to palpation  Neuro: Cranial nerves: II - XII grossly intact  Motor: Normal strength throughout DTRs: 2+ and symmetric in UE without delay in relaxation phase  Mental Status: Judgment, insight: Intact Orientation: Oriented to time, place, and person Mood and affect: No depression, anxiety, or agitation     DATA REVIEWED:  CMP     Component Value Date/Time   NA 137 04/02/2018 1331   NA 139 03/27/2017 1038   K 4.6 04/02/2018 1331   CL 103 04/02/2018 1331   CO2 24 04/02/2018 1331   GLUCOSE 104 (H) 04/02/2018 1331   BUN 32 (H) 04/02/2018 1331   BUN 38 (H) 03/27/2017 1038   CREATININE 1.37 (H) 04/02/2018 1331   CREATININE 0.91 06/25/2013 1053   CALCIUM 11.1 (H) 04/02/2018 1331   CALCIUM 10.2 10/11/2011 1326   PROT 7.0 04/02/2018 1331   ALBUMIN 4.3 04/02/2018 1331   AST 27 04/02/2018 1331   ALT 22 04/02/2018 1331   ALKPHOS 83 04/02/2018 1331   BILITOT 0.8 04/02/2018 1331   GFRNONAA 37 (L) 10/18/2017 1348   GFRNONAA 51 (L) 03/05/2013 1117   GFRAA 43 (L) 10/18/2017 1348   GFRAA 59 (L) 03/05/2013 1117   Results for Ingerson, Courtney "[name]" (MRN [id number]) as of 05/30/2018 08:17  Ref. Range 05/01/2017 13:34  PTH, Intact Latest Ref Range: 14 - 64 pg/mL 187 (H)   Results for Goffe, [name] (MRN [id number]) as of 05/31/2018 08:36  Ref. Range 05/30/2018 11:59  Sodium Latest Ref Range: 135 - 145 mEq/L 137  Potassium Latest Ref Range: 3.5 - 5.1 mEq/L 4.6  Chloride Latest Ref Range: 96 - 112 mEq/L 104  CO2 Latest Ref Range: 19 - 32 mEq/L 24  Glucose Latest Ref Range: 70 - 99 mg/dL 115 (H)  BUN Latest Ref Range: 6 - 23 mg/dL 41 (H)  Creatinine Latest Ref Range: 0.40 - 1.20 mg/dL 1.61 (H)  Calcium Latest Ref Range: 8.4 - 10.5 mg/dL 10.9 (H)  Alkaline Phosphatase Latest Ref Range: 39 - 117 U/L 73  Albumin Latest Ref Range: 3.5 - 5.2 g/dL 4.3  AST Latest Ref Range: 0 - 37 U/L 28  ALT Latest Ref Range: 0 - 35 U/L 21  Total Protein Latest Ref Range: 6.0 - 8.3 g/dL 7.1    Total Bilirubin Latest Ref Range: 0.2 - 1.2 mg/dL 0.6  GFR Latest Ref Range: >60.00 mL/min 33.14 (L)  VITD Latest Ref Range: 30.00 - 100.00 ng/mL 107.19 (HH)    DXA (08/17/2016)     The BMD measured  at Femur Neck is 0.779 g/cm2 with a T-score of -1.9. This patient is considered osteopenic according to [facility]) criteria. This patient has a right hip replacement .  Site Region Measured Date Measured Age WHO YA BMD Classification T-score AP Spine L3-L4 08/17/2016 [age] Normal 0.1 1.232 g/cm2  DualFemur Neck 08/17/2016 [age] Osteopenia -1.9 0.779 g/cm2  Left Forearm Radius 33% 08/17/2016 75.4 Normal -0.2 0.860 g/cm2  ASSESSMENT/PLAN/RECOMMENDATIONS:   1. PTH- Mediated Hypercalcemia :   - We discussed D/D of Primary Hyperparathyroidism (pHPT) vs Familial hypocalciuric hypercalcemia - We will need to obtain a 24-hr calcium urine collection to determine above. Pt understand she has to be vitamin d replete prior to proceeding with urine collection.  - If pt has pHPT, she will needs further evaluation to determine surgical candidacy - Criteria for surgical intervention (Serum calcium concentration of 1.0 mg/dL or more above the upper limit of normal,Estimated glomerular filtration rate (eGFR) <60 mL/min, evidence of Osteoporosis on bone density, Twenty-four-hour urinary calcium >300 mg/day ,Nephrolithiasis or nephrocalcinosis by radiograph, Age less than 50 years.) - Encouraged hydration  - AVOID CALCIUM SUPPLEMENTS, AVOID LOW CALCIUM DIET - Maintain normal dietary calcium intake (2-3 servings of dairy a day) -  DXA to rule out Osteoporosis - Renal ultrasound to r/o nephrolithiasis or nephrocalcinosis.   - In review of her lab results , she has hypervitaminosis D, corrected calcium is 10.66 mg/dL. Part of her hypercalcemia is the high level of vitamin D. We will not proceed with 24-hr urine collection at this time,until vitamin D normalizes. Still awaiting on PTh  levels.    2. Hypervitaminosis D:   - She is currently on Vitamin D 4000 iu daily, we will cut that to 1000 units daily and recheck on next visit. It takes a long time for Vitamin D to adjust levels.  Pt expressed the above.    F/u in 3 months.   Signed electronically by: Mack Guise, MD  [facility] Endocrinology  [facility] [address] [place], [place] [address] Phone: [phone] FAX: [phone]   CC: [name], [address] [place]  [address] Phone: [phone] Fax: [phone]   Return to Endocrinology clinic as below: Future Appointments  Date Time Provider [place]  07/15/2018  1:20 PM Courtney Alar, NP [facility]  09/05/2018 11:10 AM Katieann Hungate, Melanie Crazier, MD [facility] None  03/25/2019  1:00 PM Dennis Bast, RN [facility]

## 2018-05-30 NOTE — Telephone Encounter (Signed)
Calcium was not excessively high and will defer any dosage adjustment to Dr. Maretta Bees

## 2018-05-30 NOTE — Patient Instructions (Addendum)
-   Try to stay hydrated - AVOID CALCIUM SUPPLEMENTS, AVOID LOW CALCIUM DIET - Maintain normal dietary calcium intake (2-3 servings of dairy a day) - FOODS THAT CONTAIN CALCIUM  Calcium can be found in many foods, not only in dairy products.  Dairy Foods  Yogurt (1 cup) 350 mg  Milk (1 cup) 300 mg  Cheddar cheese (1 oz.) 204 mg  Ricotta cheese, part skim (1/4 cup) 169 mg  Cottage cheese (1 cup) 150 mg  Nondairy Foods  Whole Grain Total cereal (3/4 cup) 1000 mg  Pink salmon with bones, sardines (3 oz., cooked) 181 mg  Black beans (1 cup) 103 mg  Broccoli (1 cup, cooked) 150 mg  Almonds (1 tbsp.) 50 mg  Soy Products  Soy yogurt with calcium (3/4 cup) 300 mg  Soy milk enriched with calcium (1 cup) 300 mg  Tofu, firm or extra firm (1/4 cup) 250 mg  Soy nuts, roasted/salted (1/2 cup) 103 mg

## 2018-05-30 NOTE — Telephone Encounter (Signed)
Main lab called with a critical vit d of 107.

## 2018-05-31 ENCOUNTER — Telehealth: Payer: Self-pay | Admitting: Internal Medicine

## 2018-05-31 LAB — PTH, INTACT AND CALCIUM
Calcium: 10.8 mg/dL — ABNORMAL HIGH (ref 8.6–10.4)
PTH: 176 pg/mL — ABNORMAL HIGH (ref 14–64)

## 2018-05-31 MED ORDER — VITAMIN D 25 MCG (1000 UNIT) PO TABS: 1000.0000 [IU] | ORAL_TABLET | Freq: Every day | ORAL | Status: DC

## 2018-05-31 NOTE — Telephone Encounter (Signed)
Left a message for the patient to call back to discuss abnormal lab results.    [name]

## 2018-06-27 ENCOUNTER — Other Ambulatory Visit: Payer: Self-pay

## 2018-06-27 MED ORDER — BISOPROLOL FUMARATE 10 MG PO TABS: 20.0000 mg | ORAL_TABLET | Freq: Every day | ORAL | 1 refills | Status: DC

## 2018-06-27 MED ORDER — SIMVASTATIN 10 MG PO TABS: 10.0000 mg | ORAL_TABLET | Freq: Every day | ORAL | 1 refills | Status: DC

## 2018-06-27 MED ORDER — LOSARTAN POTASSIUM 100 MG PO TABS: 100.0000 mg | ORAL_TABLET | Freq: Every day | ORAL | 1 refills | Status: DC

## 2018-06-27 MED ORDER — FUROSEMIDE 20 MG PO TABS: ORAL_TABLET | ORAL | 1 refills | Status: DC

## 2018-07-15 ENCOUNTER — Ambulatory Visit (INDEPENDENT_AMBULATORY_CARE_PROVIDER_SITE_OTHER): Payer: Medicare Other | Admitting: Family

## 2018-07-15 ENCOUNTER — Encounter: Payer: Self-pay | Admitting: Family

## 2018-07-15 VITALS — BP 155/69 | HR 71 | Temp 97.8°F | Resp 16 | Ht 66.0 in | Wt 196.0 lb

## 2018-07-15 DIAGNOSIS — I10 Essential (primary) hypertension: Secondary | ICD-10-CM | POA: Diagnosis not present

## 2018-07-15 DIAGNOSIS — N289 Disorder of kidney and ureter, unspecified: Secondary | ICD-10-CM | POA: Diagnosis not present

## 2018-07-15 DIAGNOSIS — K219 Gastro-esophageal reflux disease without esophagitis: Secondary | ICD-10-CM

## 2018-07-15 DIAGNOSIS — E785 Hyperlipidemia, unspecified: Secondary | ICD-10-CM

## 2018-07-15 MED ORDER — GABAPENTIN 100 MG PO CAPS: 100.0000 mg | ORAL_CAPSULE | Freq: Two times a day (BID) | ORAL | 3 refills | Status: DC | PRN

## 2018-07-15 MED ORDER — OMEPRAZOLE 20 MG PO CPDR
20.0000 mg | DELAYED_RELEASE_CAPSULE | Freq: Every day | ORAL | 1 refills | Status: DC
Start: 2018-07-15 — End: 2019-01-03

## 2018-07-15 NOTE — Progress Notes (Signed)
Subjective:    Patient ID: [name], female    DOB: Nov 13, 1940, 78 y.o.   MRN: [id number]  HPI  Patient is a 78 yr old female who presents today for routine follow up:  HTN- reports home bp readings in the 409'W systolic.   BP Readings from Last 3 Encounters:  07/15/18 (!) 155/69  05/30/18 138/68  04/02/18 (!) 142/84   Depression/anxiety- Pt is maintained on fluoxetine. She reports feeling well on daily dose of prozac.  Wt Readings from Last 3 Encounters:  07/15/18 196 lb (88.9 kg)  05/30/18 193 lb 3.2 oz (87.6 kg)  04/02/18 193 lb (87.5 kg)   Hyperlipidemia- maintained on statin.  Lab Results  Component Value Date   CHOL 155 04/02/2018   HDL 54.80 04/02/2018   LDLCALC 86 04/02/2018   LDLDIRECT 110.4 12/13/2006   TRIG 74.0 04/02/2018   CHOLHDL 3 04/02/2018   GERD- reports symptoms well controlled on omeprazole 40mg .  Review of Systems See HPI  Past Medical History:  Diagnosis Date  . Allergic rhinitis   . Ankle fracture    Left, Mortise Widening  . Arthritis    "knuckles" (06/17/2012)  . 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 Breast cancer ([place])    "right" (06/17/2012)  . Cataracts, bilateral   . Dyspnea    with exertion  . Exertional dyspnea   . GERD (gastroesophageal reflux disease)   . Hypertension   . Iron deficiency anemia    "hematologist watches it" (06/17/2012)  . Obesity   . OSA treated with BiPAP 03/08/2016  . Osteoarthritis    severe right hip osteoarthritis-s/p hip replacement  . Osteopenia 11/26/2009  . Thyroid disorder    "sees endocrinologist yearly" (06/17/2012)     Social History   Socioeconomic History  . Marital status: Widowed    Spouse name: Not on file  . Number of children: Not on file  . Years of education: Not on file  . Highest education level: Not on file  Occupational History  . Not on file  Social Needs  . Financial resource strain: Not on file  . Food insecurity:    Worry: Not on file    Inability: Not on  file  . Transportation needs:    Medical: Not on file    Non-medical: Not on file  Tobacco Use  . Smoking status: Former Smoker    Packs/day: 0.50    Years: 10.00    Pack years: 5.00    Types: Cigarettes    Last attempt to quit: 07/14/1974    Years since quitting: 44.0  . Smokeless tobacco: Never Used  . Tobacco comment: quit in 1976 smoked for approx. 10 years  Substance and Sexual Activity  . Alcohol use: Not Currently  . Drug use: No  . Sexual activity: Never  Lifestyle  . Physical activity:    Days per week: Not on file    Minutes per session: Not on file  . Stress: Not on file  Relationships  . Social connections:    Talks on phone: Not on file    Gets together: Not on file    Attends religious service: Not on file    Active member of club or organization: Not on file    Attends meetings of clubs or organizations: Not on file    Relationship status: Not on file  . Intimate partner violence:    Fear of current or ex partner: Not on file    Emotionally abused: Not on  file    Physically abused: Not on file    Forced sexual activity: Not on file  Other Topics Concern  . Not on file  Social History Narrative   Retired   The patient is married and has one child and two grandchildren that live in the area.     She drinks wine with dinner.  She quit smoking in 1976, smoked for   approximately 10 years.      Moved to [place] from [place]          Past Surgical History:  Procedure Laterality Date  . ANKLE FRACTURE SURGERY Right 01/01/14   Has pins. Procedure performed at [facility]   "bilaterlly" (06/17/2012)  . COLONOSCOPY W/ POLYPECTOMY    . EYE SURGERY  2011   cataract removal both eyes  . HIP CLOSED REDUCTION  07/26/2012   Procedure: CLOSED MANIPULATION HIP;  Surgeon: Nita Sells, MD;  [facility];  Service: Orthopedics;  Laterality: Right;  . LUMBAR LAMINECTOMY  10/26/2017  . LUMBAR  LAMINECTOMY/DECOMPRESSION MICRODISCECTOMY Bilateral 10/25/2017   Procedure: LUMBAR 2-3, LUMBAR 3-4 DECOMPRESSION TIME REQUESTED 4.5 HOURS;  Surgeon: Phylliss Bob, MD;  Location: [place];  Service: Orthopedics;  Laterality: Bilateral;  . MASTECTOMY  1993?   bilateral mastectomy  . ORIF ANKLE FRACTURE Left 07/30/2017   Procedure: LEFT OPEN REDUCTION INTERNAL FIXATION (ORIF) ANKLE FRACTURE WITH SYDESMOTIC FIXATION;  Surgeon: Dorna Leitz, MD;  Location: [place];  Service: Orthopedics;  Laterality: Left;  . TONSILLECTOMY  1949  . TOTAL HIP ARTHROPLASTY  04/2006   right hip   . TOTAL HIP REVISION  06/17/2012   "right" (06/17/2012)  . TOTAL HIP REVISION  06/17/2012   Procedure: TOTAL HIP REVISION;  Surgeon: Kerin Salen, MD;  Location: [place];  Service: Orthopedics;  Laterality: Right;    Family History  Problem Relation Age of Onset  . Heart failure Father        deceased age 60  . Diabetes Father   . Alcohol abuse Father   . Breast cancer Mother        deceased at age 47 secondary to breast cancer  . Liver disease Sister        Liver failure--deceased  . Dementia Maternal Grandmother   . Colon cancer Neg Hx   . Esophageal cancer Neg Hx   . Rectal cancer Neg Hx   . Stomach cancer Neg Hx     Allergies  Allergen Reactions  . Amlodipine Swelling    Swelling   . Sulfonamide Derivatives Swelling    REACTION: Swelling of the face; "eyes swelled shut"  . Chocolate Other (See Comments)    Sinus problems    Current Outpatient Medications on File Prior to Visit  Medication Sig Dispense Refill  . bisoprolol (ZEBETA) 10 MG tablet Take 2 tablets (20 mg total) by mouth daily. 180 tablet 1  . fexofenadine (ALLEGRA) 180 MG tablet Take 180 mg by mouth daily as needed. For seasonal allergies    . FLUoxetine (PROZAC) 10 MG capsule TAKE 1 CAPSULE BY MOUTH EVERY DAY 90 capsule 1  . fluticasone (FLONASE) 50 MCG/ACT nasal spray USE 1 SPRAY IN EACH NOSTRIL EVERY DAY (Patient taking differently: Use 1  spray in each nostril once daily as needed for congestion) 32 g 1  . furosemide (LASIX) 20 MG tablet Take 1 tablet every morning, 1 tablet after 3pm and may take 1 tablet every evening as needed for  swelling. 270 tablet 1  . Lidocaine (ASPERCREME LIDOCAINE) 4 % PTCH Apply 1 patch topically daily as needed (pain).    Marland Kitchen losartan (COZAAR) 100 MG tablet Take 1 tablet (100 mg total) by mouth daily. 90 tablet 1  . meloxicam (MOBIC) 15 MG tablet Take 15 mg by mouth daily.    . Omega-3 Fatty Acids (FISH OIL) 1200 MG CAPS Take by mouth.    Marland Kitchen omeprazole (PRILOSEC) 40 MG capsule TAKE 1 CAPSULE (40 MG TOTAL) EVERY DAY 90 capsule 1  . Polyethyl Glycol-Propyl Glycol (SYSTANE OP) Apply 1 drop to eye daily as needed (dry eyes).    . potassium chloride SA (K-DUR,KLOR-CON) 20 MEQ tablet TAKE 1 TABLET EVERY DAY 90 tablet 1  . simvastatin (ZOCOR) 10 MG tablet Take 1 tablet (10 mg total) by mouth daily. 90 tablet 1   Current Facility-Administered Medications on File Prior to Visit  Medication Dose Route Frequency Provider Last Rate Last Dose  . cholecalciferol (VITAMIN D3) tablet 1,000 Units  1,000 Units Oral Daily Shamleffer, Melanie Crazier, MD        BP (!) 155/69 (BP Location: Right Arm, Patient Position: Sitting, Cuff Size: Large)   Pulse 71   Temp 97.8 F (36.6 C) (Oral)   Resp 16   Ht 5\' 6"  (1.676 m)   Wt 196 lb (88.9 kg)   LMP 07/11/1991   SpO2 100%   BMI 31.64 kg/m       Objective:   Physical Exam Constitutional:      Appearance: She is well-developed.  Cardiovascular:     Rate and Rhythm: Normal rate and regular rhythm.     Heart sounds: Normal heart sounds. No murmur.  Pulmonary:     Effort: Pulmonary effort is normal. No respiratory distress.     Breath sounds: Normal breath sounds. No wheezing.  Musculoskeletal:        General: No swelling.  Psychiatric:        Behavior: Behavior normal.        Thought Content: Thought content normal.        Judgment: Judgment normal.            Assessment & Plan:  HTN- bp is mildly elevated today but better at home and on recent visits. Advised pt to continue current meds and monitor bp at home. Let me know if consistently >024 systolic.  GERD- stable on omeprazole 40mg . Advised pt to try cutting down to 20mg  once daily if if stable on this dose can try d/c'ing.   Hyperlipidemia- Stable on statin, continue same.  Renal insufficiency- Cr was up a bit at the time of her endo appointment.  She is advised to discontinue use of meloxicam.   Depression- stable/improved on prozac 10mg  once daily.

## 2018-08-28 DIAGNOSIS — H43813 Vitreous degeneration, bilateral: Secondary | ICD-10-CM | POA: Diagnosis not present

## 2018-08-28 DIAGNOSIS — Z961 Presence of intraocular lens: Secondary | ICD-10-CM | POA: Diagnosis not present

## 2018-08-28 DIAGNOSIS — H52203 Unspecified astigmatism, bilateral: Secondary | ICD-10-CM | POA: Diagnosis not present

## 2018-08-28 DIAGNOSIS — H524 Presbyopia: Secondary | ICD-10-CM | POA: Diagnosis not present

## 2018-09-04 ENCOUNTER — Encounter: Payer: Self-pay | Admitting: Family

## 2018-09-04 MED ORDER — GABAPENTIN 100 MG PO CAPS: 100.0000 mg | ORAL_CAPSULE | Freq: Two times a day (BID) | ORAL | 5 refills | Status: DC | PRN

## 2018-09-05 ENCOUNTER — Ambulatory Visit: Payer: Medicare Other | Admitting: Internal Medicine

## 2018-09-06 ENCOUNTER — Encounter: Payer: Self-pay | Admitting: Family

## 2018-09-09 ENCOUNTER — Encounter: Payer: Self-pay | Admitting: Family

## 2018-09-09 ENCOUNTER — Other Ambulatory Visit: Payer: Self-pay | Admitting: Family

## 2018-09-17 ENCOUNTER — Other Ambulatory Visit: Payer: Self-pay

## 2018-09-17 ENCOUNTER — Ambulatory Visit (INDEPENDENT_AMBULATORY_CARE_PROVIDER_SITE_OTHER): Payer: Medicare Other | Admitting: Internal Medicine

## 2018-09-17 ENCOUNTER — Encounter: Payer: Self-pay | Admitting: Internal Medicine

## 2018-09-17 DIAGNOSIS — E673 Hypervitaminosis D: Secondary | ICD-10-CM | POA: Diagnosis not present

## 2018-09-17 LAB — BASIC METABOLIC PANEL
BUN: 53 mg/dL — ABNORMAL HIGH (ref 6–23)
CHLORIDE: 104 meq/L (ref 96–112)
CO2: 23 mEq/L (ref 19–32)
CREATININE: 1.76 mg/dL — AB (ref 0.40–1.20)
Calcium: 10.4 mg/dL (ref 8.4–10.5)
GFR: 28.11 mL/min — ABNORMAL LOW (ref >60.00)
Glucose, Bld: 94 mg/dL (ref 70–99)
POTASSIUM: 4.7 meq/L (ref 3.5–5.1)
Sodium: 136 mEq/L (ref 135–145)

## 2018-09-17 LAB — ALBUMIN: ALBUMIN: 4 g/dL (ref 3.5–5.2)

## 2018-09-17 LAB — VITAMIN D 25 HYDROXY (VIT D DEFICIENCY, FRACTURES): VITD: 94.25 ng/mL (ref 30.00–100.00)

## 2018-09-17 NOTE — Patient Instructions (Signed)
-   Try to stay hydrated - AVOID CALCIUM SUPPLEMENTS, AVOID LOW CALCIUM DIET - Maintain normal dietary calcium intake (2-3 servings of dairy a day) - FOODS THAT CONTAIN CALCIUM  Calcium can be found in many foods, not only in dairy products.  Dairy Foods  Yogurt (1 cup) 350 mg  Milk (1 cup) 300 mg  Cheddar cheese (1 oz.) 204 mg  Ricotta cheese, part skim (1/4 cup) 169 mg  Cottage cheese (1 cup) 150 mg  Nondairy Foods  Whole Grain Total cereal (3/4 cup) 1000 mg  Pink salmon with bones, sardines (3 oz., cooked) 181 mg  Black beans (1 cup) 103 mg  Broccoli (1 cup, cooked) 150 mg  Almonds (1 tbsp.) 50 mg  Soy Products  Soy yogurt with calcium (3/4 cup) 300 mg  Soy milk enriched with calcium (1 cup) 300 mg  Tofu, firm or extra firm (1/4 cup) 250 mg  Soy nuts, roasted/salted (1/2 cup) 103 mg   24-Hour Urine Collection   You will be collecting your urine for a 24-hour period of time.  Your timer starts with your first urine of the morning (For example - If you first pee at [place], your timer will start at [place])  [place] away your first urine of the morning  Collect your urine every time you pee for the next 24 hours STOP your urine collection 24 hours after you started the collection (For example - You would stop at 9AM the day after you started)

## 2018-09-17 NOTE — Progress Notes (Signed)
Name: [name]  MRN/ DOB: [id number], October 09, 1940    Age/ Sex: 78 y.o., female     PCP: Debbrah Alar, NP   Reason for Endocrinology Evaluation: Hypercalcemia     Initial Endocrinology Clinic Visit: 05/30/2018    PATIENT IDENTIFIER: [name] is a 78 y.o., female with a past medical history ofHTN, CHF, SVT, Osteopenia and left foot drop . She has followed with [facility] since 05/30/2018 for consultative assistance with management of her Hypercalcemia .   HISTORICAL SUMMARY: The patient was noted to have intermittent hypercalcemia since 09/2008. This has become more persistent in April, 2019. Pt also was noted to have an elevated PTH in 2013, 2014 and in 2018.   On her initial presentation she was asymptomatic She was taking 4000 IU vitamin D supplements, she was found to have hypervitaminosis D at 107.19 NG per mL, this was reduced to 1000 IU daily.  She has been off HCTZ since 2016.  DEXA scan showed osteopenia in 2010   SUBJECTIVE:     Today (09/18/2018):  [name] is here for 7-monthfollow-up on hypercalcemia. Since her last visit she has denies renal stones, polyuria , polydipsia or constipation.  She is currently on 1000 iu of Vitamin D.     ROS:  As per HPI.   HISTORY:  Past Medical History:  Past Medical History:  Diagnosis Date  . Allergic rhinitis   . Ankle fracture    Left, Mortise Widening  . Arthritis    "knuckles" (06/17/2012)  . 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 Breast cancer [place]    "right" (06/17/2012)  . Cataracts, bilateral   . Dyspnea    with exertion  . Exertional dyspnea   . GERD (gastroesophageal reflux disease)   . Hypertension   . Iron deficiency anemia    "hematologist watches it" (06/17/2012)  . Obesity   . OSA treated with BiPAP 03/08/2016  . Osteoarthritis    severe right hip osteoarthritis-s/p hip replacement  . Osteopenia 11/26/2009  . Thyroid disorder    "sees  endocrinologist yearly" (06/17/2012)   Past Surgical History:  Past Surgical History:  Procedure Laterality Date  . ANKLE FRACTURE SURGERY Right 01/01/14   Has pins. [facility]" (06/17/2012)  . COLONOSCOPY W/ POLYPECTOMY    . EYE SURGERY  2011   cataract removal both eyes  . HIP CLOSED REDUCTION  07/26/2012   Procedure: CLOSED MANIPULATION HIP;  Surgeon: JNita Sells MD;  [facility];  Service: Orthopedics;  Laterality: Right;  . LUMBAR LAMINECTOMY  10/26/2017  . LUMBAR LAMINECTOMY/DECOMPRESSION MICRODISCECTOMY Bilateral 10/25/2017   Procedure: LUMBAR 2-3, LUMBAR 3-4 DECOMPRESSION TIME REQUESTED 4.5 HOURS;  Surgeon: DPhylliss Bob MD;  [facility]  Service: Orthopedics;  Laterality: Bilateral;  . MASTECTOMY  1993?   bilateral mastectomy  . ORIF ANKLE FRACTURE Left 07/30/2017   Procedure: LEFT OPEN REDUCTION INTERNAL FIXATION (ORIF) ANKLE FRACTURE WITH SYDESMOTIC FIXATION;  Surgeon: GDorna Leitz MD;  [facility]  Service: Orthopedics;  Laterality: Left;  . TONSILLECTOMY  1949  . TOTAL HIP ARTHROPLASTY  04/2006   right hip   . TOTAL HIP REVISION  06/17/2012   "right" (06/17/2012)  . TOTAL HIP REVISION  06/17/2012   Procedure: TOTAL HIP REVISION;  Surgeon: FKerin Salen MD;  [facility]  Service: Orthopedics;  Laterality: Right;    Social History:  reports that she quit smoking  about 44 years ago. Her smoking use included cigarettes. She has a 5.00 pack-year smoking history. She has never used smokeless tobacco. She reports previous alcohol use. She reports that she does not use drugs. Family History:  Family History  Problem Relation Age of Onset  . Heart failure Father        deceased age 52  . Diabetes Father   . Alcohol abuse Father   . Breast cancer Mother        deceased at age 31 secondary to breast cancer  . Liver disease Sister        Liver failure--deceased  . Dementia  Maternal Grandmother   . Colon cancer Neg Hx   . Esophageal cancer Neg Hx   . Rectal cancer Neg Hx   . Stomach cancer Neg Hx      HOME MEDICATIONS: Allergies as of 09/17/2018      Reactions   Amlodipine Swelling   Swelling   Sulfonamide Derivatives Swelling   REACTION: Swelling of the face; "eyes swelled shut"   Chocolate Other (See Comments)   Sinus problems      Medication List       Accurate as of September 17, 2018 11:59 PM. Always use your most recent med list.        Aspercreme Lidocaine 4 % Ptch Generic drug:  Lidocaine Apply 1 patch topically daily as needed (pain).   bisoprolol 10 MG tablet Commonly known as:  ZEBETA Take 2 tablets (20 mg total) by mouth daily.   cholecalciferol 25 MCG (1000 UT) tablet Commonly known as:  VITAMIN D3   fexofenadine 180 MG tablet Commonly known as:  ALLEGRA Take 180 mg by mouth daily as needed. For seasonal allergies   Fish Oil 1200 MG Caps Take by mouth.   FLUoxetine 10 MG capsule Commonly known as:  PROZAC TAKE 1 CAPSULE BY MOUTH EVERY DAY   fluticasone 50 MCG/ACT nasal spray Commonly known as:  FLONASE USE 1 SPRAY IN EACH NOSTRIL EVERY DAY   furosemide 20 MG tablet Commonly known as:  LASIX Take 1 tablet every morning, 1 tablet after 3pm and may take 1 tablet every evening as needed for swelling.   gabapentin 100 MG capsule Commonly known as:  NEURONTIN TAKE 1 CAPSULE BY MOUTH THREE TIMES A DAY   losartan 100 MG tablet Commonly known as:  COZAAR Take 1 tablet (100 mg total) by mouth daily.   omeprazole 20 MG capsule Commonly known as:  PRILOSEC Take 1 capsule (20 mg total) by mouth daily.   potassium chloride SA 20 MEQ tablet Commonly known as:  K-DUR,KLOR-CON TAKE 1 TABLET EVERY DAY   simvastatin 10 MG tablet Commonly known as:  ZOCOR Take 1 tablet (10 mg total) by mouth daily.   SYSTANE OP Apply 1 drop to eye daily as needed (dry eyes).         OBJECTIVE:   PHYSICAL EXAM: VS: BP (!) 148/78  (BP Location: Right Arm, Patient Position: Sitting, Cuff Size: Large)   Pulse 64   Ht '5\' 6"'$  (1.676 m)   Wt 196 lb (88.9 kg)   LMP 07/11/1991   SpO2 98%   BMI 31.64 kg/m    EXAM: General: Pt appears well and is in NAD  Neck: General: Supple without adenopathy. Thyroid: Thyroid size normal.  No goiter or nodules appreciated. No thyroid bruit.  Lungs: Clear with good BS bilat with no rales, rhonchi, or wheezes  Heart: Auscultation: RRR.  Abdomen: Normoactive bowel sounds,  soft, nontender, without masses or organomegaly palpable  Extremities:  BL LE: Pretibial non-pitting edema present  Neuro: Cranial nerves: II - XII grossly intact  Motor: Normal strength throughout DTRs: 2+ and symmetric in UE without delay in relaxation phase  Mental Status: Judgment, insight: Intact Orientation: Oriented to time, place, and person Mood and affect: No depression, anxiety, or agitation     DATA REVIEWED: Results for [name], [name] (MRN [id number]) as of 09/18/2018 07:36  Ref. Range 09/17/2018 11:33  Sodium Latest Ref Range: 135 - 145 mEq/L 136  Potassium Latest Ref Range: 3.5 - 5.1 mEq/L 4.7  Chloride Latest Ref Range: 96 - 112 mEq/L 104  CO2 Latest Ref Range: 19 - 32 mEq/L 23  Glucose Latest Ref Range: 70 - 99 mg/dL 94  BUN Latest Ref Range: 6 - 23 mg/dL 53 (H)  Creatinine Latest Ref Range: 0.40 - 1.20 mg/dL 1.76 (H)  Calcium Latest Ref Range: 8.4 - 10.5 mg/dL 10.4  Albumin Latest Ref Range: 3.5 - 5.2 g/dL 4.0  GFR Latest Ref Range: >60.00 mL/min 28.11 (L)  VITD Latest Ref Range: 30.00 - 100.00 ng/mL 94.25      ASSESSMENT / PLAN / RECOMMENDATIONS:   1. 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 - If pt has pHPT, she will need to have parathyroidectomy based  on low GFR of less than 60 mL/min - Criteria for surgical intervention (Serum calcium concentration of 1.0 mg/dL or more above the upper limit of normal,Estimated glomerular filtration rate (eGFR) <60 mL/min, evidence of Osteoporosis on bone density, Twenty-four-hour urinary calcium >300 mg/day ,Nephrolithiasis or nephrocalcinosis by radiograph, Age less than 50 years.) -Repeat BMP today shows normal serum calcium at 10.4 mg/dL, her vitamin D levels have reduced to normal levels but they are still high at 94.25 ng/mL -PTH levels are still pending, GFR continues to deteriorate.  2.  Hypervitaminosis D:  -Vitamin D levels have come down from 107.19 ng/mL down to 94.25 ng/mL, even though this is a normal number but is still in the high upper normal range. -We will stop all vitamin D supplements. -I would like to recheck vitamin D in 6 weeks.     Follow-up in 3 months     Signed electronically by: Mack Guise, MD  [facility]  [facility] [address] [place], [place] [address] Phone: [phone] FAX: [phone]      CC: Debbrah Alar, [address] [place] [address] Phone: [phone]  Fax: [phone]   Return to Endocrinology clinic as below: Future Appointments  Date Time Provider Campus  10/14/2018 11:00 AM Debbrah Alar, NP [facility]  12/18/2018 11:10 AM Paislea Hatton, Melanie Crazier, MD [facility] None  03/25/2019  1:00 PM Dennis Bast, RN [facility]

## 2018-09-18 ENCOUNTER — Encounter: Payer: Self-pay | Admitting: Internal Medicine

## 2018-09-18 DIAGNOSIS — E673 Hypervitaminosis D: Secondary | ICD-10-CM | POA: Insufficient documentation

## 2018-09-18 LAB — PTH, INTACT AND CALCIUM
CALCIUM: 10.5 mg/dL — AB (ref 8.6–10.4)
PTH: 87 pg/mL — AB (ref 14–64)

## 2018-09-23 ENCOUNTER — Other Ambulatory Visit: Payer: Self-pay | Admitting: Internal Medicine

## 2018-09-23 ENCOUNTER — Other Ambulatory Visit: Payer: Self-pay

## 2018-09-23 ENCOUNTER — Other Ambulatory Visit (INDEPENDENT_AMBULATORY_CARE_PROVIDER_SITE_OTHER): Payer: Medicare Other

## 2018-09-24 LAB — CALCIUM, URINE, 24 HOUR: CALCIUM 24H UR: 41 mg/(24.h)

## 2018-09-24 LAB — CREATININE, URINE, 24 HOUR: Creatinine, 24H Ur: 0.7 g/(24.h) (ref 0.50–2.15)

## 2018-10-02 ENCOUNTER — Other Ambulatory Visit: Payer: Self-pay | Admitting: Family

## 2018-10-10 ENCOUNTER — Telehealth: Payer: Self-pay

## 2018-10-10 NOTE — Telephone Encounter (Signed)
Appointment has been scheduled for patient to do WEB visit. Advised providers nurse will call prior to appointment to go over insrtuctions for Web visit. Patient agreed.

## 2018-10-10 NOTE — Telephone Encounter (Signed)
Copied from [place] [id number]. Topic: General - Other >> Oct 10, 2018  9:37 AM Leward Quan A wrote: Reason for CRM: Patient called to say that she is ok with having a Web visit for her follow up. Please contact patient to inform how and get her scheduled. Ph# [phone]

## 2018-10-14 ENCOUNTER — Ambulatory Visit: Payer: Medicare Other | Admitting: Family

## 2018-10-15 ENCOUNTER — Other Ambulatory Visit: Payer: Self-pay

## 2018-10-15 ENCOUNTER — Ambulatory Visit (INDEPENDENT_AMBULATORY_CARE_PROVIDER_SITE_OTHER): Payer: Medicare Other | Admitting: Family

## 2018-10-15 DIAGNOSIS — E213 Hyperparathyroidism, unspecified: Secondary | ICD-10-CM | POA: Diagnosis not present

## 2018-10-15 DIAGNOSIS — E785 Hyperlipidemia, unspecified: Secondary | ICD-10-CM

## 2018-10-15 DIAGNOSIS — R739 Hyperglycemia, unspecified: Secondary | ICD-10-CM

## 2018-10-15 DIAGNOSIS — G4733 Obstructive sleep apnea (adult) (pediatric): Secondary | ICD-10-CM

## 2018-10-15 DIAGNOSIS — I10 Essential (primary) hypertension: Secondary | ICD-10-CM

## 2018-10-15 NOTE — Progress Notes (Signed)
Virtual Visit via Video Note  I connected with [name] on 10/15/18 at 11:00 AM EDT by a video enabled telemedicine application and verified that I am speaking with the correct person using two identifiers. This visit type was conducted due to national recommendations for restrictions regarding the COVID-19 Pandemic (e.g. social distancing).  This format is felt to be most appropriate for this patient at this time.   I discussed the limitations of evaluation and management by telemedicine and the availability of in person appointments. The patient expressed understanding and agreed to proceed.  Only the patient and myself were on today's video visit. The patient was at home and I was in my office at the time of today's visit.   History of Present Illness:  HTN- repots bp 134/82 this AM.  Weight 195 Wt Readings from Last 3 Encounters:  09/17/18 196 lb (88.9 kg)  07/15/18 196 lb (88.9 kg)  05/30/18 193 lb 3.2 oz (87.6 kg)   BP Readings from Last 3 Encounters:  09/17/18 (!) 148/78  07/15/18 (!) 155/69  05/30/18 138/68   Depression/anxiety- continues fluoxetine. Reports that her mood is good.  Hyperlipidemia- maintained on statin.   Lab Results  Component Value Date   CHOL 155 04/02/2018   HDL 54.80 04/02/2018   LDLCALC 86 04/02/2018   LDLDIRECT 110.4 12/13/2006   TRIG 74.0 04/02/2018   CHOLHDL 3 04/02/2018   Hyperglycemia-  Lab Results  Component Value Date   HGBA1C 6.2 05/01/2017   Hyperparathyroid- she has been working with endocrinology.    gerd- reports that once in a while in the evening she has some belching, otherwise feels ok.     OSA- not using bipap.  Has not used in about 1 year.   Observations/Objective:   Gen: Awake, alert, no acute distress Resp: Breathing is even and non-labored Psych: calm/pleasant demeanor Neuro: Alert and Oriented x 3, + facial symmetry, speech is clear.  Assessment and Plan:  HTN- bp stable on current meds. Continue same.    Hyperglycemia- will obtain follow up a1c  OSA- not using bipap. I advised her to follow up with Dr. Claiborne Billings to discuss restarting.  GERD- stable off of PPI.    Hyperlipidemia- obtain follow up lipid panel. Continue statin.     Follow Up Instructions:    I discussed the assessment and treatment plan with the patient. The patient was provided an opportunity to ask questions and all were answered. The patient agreed with the plan and demonstrated an understanding of the instructions.   The patient was advised to call back or seek an in-person evaluation if the symptoms worsen or if the condition fails to improve as anticipated.    Nance Pear, NP

## 2018-10-23 ENCOUNTER — Other Ambulatory Visit: Payer: Self-pay | Admitting: Family

## 2018-10-28 ENCOUNTER — Other Ambulatory Visit: Payer: Self-pay | Admitting: Family

## 2018-10-31 ENCOUNTER — Other Ambulatory Visit: Payer: Medicare Other

## 2018-12-09 ENCOUNTER — Encounter: Payer: Self-pay | Admitting: Internal Medicine

## 2018-12-09 ENCOUNTER — Encounter: Payer: Self-pay | Admitting: Family

## 2018-12-18 ENCOUNTER — Ambulatory Visit: Payer: Medicare Other | Admitting: Internal Medicine

## 2018-12-25 ENCOUNTER — Other Ambulatory Visit: Payer: Self-pay

## 2018-12-25 ENCOUNTER — Other Ambulatory Visit (INDEPENDENT_AMBULATORY_CARE_PROVIDER_SITE_OTHER): Payer: Medicare Other

## 2018-12-25 DIAGNOSIS — E785 Hyperlipidemia, unspecified: Secondary | ICD-10-CM

## 2018-12-25 DIAGNOSIS — R739 Hyperglycemia, unspecified: Secondary | ICD-10-CM | POA: Diagnosis not present

## 2018-12-25 LAB — LIPID PANEL
Cholesterol: 156 mg/dL (ref 0–200)
HDL: 68.8 mg/dL (ref >39.00)
LDL Cholesterol: 73 mg/dL (ref 0–99)
NonHDL: 87.09
Total CHOL/HDL Ratio: 2
Triglycerides: 71 mg/dL (ref 0.0–149.0)
VLDL: 14.2 mg/dL (ref 0.0–40.0)

## 2018-12-25 LAB — COMPREHENSIVE METABOLIC PANEL
ALT: 12 U/L (ref 0–35)
AST: 19 U/L (ref 0–37)
Albumin: 4.4 g/dL (ref 3.5–5.2)
Alkaline Phosphatase: 62 U/L (ref 39–117)
BUN: 39 mg/dL — ABNORMAL HIGH (ref 6–23)
CO2: 26 mEq/L (ref 19–32)
Calcium: 10.6 mg/dL — ABNORMAL HIGH (ref 8.4–10.5)
Chloride: 103 mEq/L (ref 96–112)
Creatinine, Ser: 1.54 mg/dL — ABNORMAL HIGH (ref 0.40–1.20)
GFR: 32.77 mL/min — ABNORMAL LOW (ref >60.00)
Glucose, Bld: 91 mg/dL (ref 70–99)
Potassium: 5 mEq/L (ref 3.5–5.1)
Sodium: 137 mEq/L (ref 135–145)
Total Bilirubin: 0.6 mg/dL (ref 0.2–1.2)
Total Protein: 6.8 g/dL (ref 6.0–8.3)

## 2018-12-25 LAB — HEMOGLOBIN A1C: Hgb A1c MFr Bld: 5.9 % (ref 4.6–6.5)

## 2018-12-26 ENCOUNTER — Ambulatory Visit (INDEPENDENT_AMBULATORY_CARE_PROVIDER_SITE_OTHER): Payer: Medicare Other | Admitting: Internal Medicine

## 2018-12-26 LAB — VITAMIN D 25 HYDROXY (VIT D DEFICIENCY, FRACTURES): VITD: 89.96 ng/mL (ref 30.00–100.00)

## 2018-12-26 NOTE — Patient Instructions (Signed)
-   24-Hour Urine Collection   You will be collecting your urine for a 24-hour period of time.  Your timer starts with your first urine of the morning (For example - If you first pee at [place], your timer will start at [place])  [place] away your first urine of the morning  Collect your urine every time you pee for the next 24 hours STOP your urine collection 24 hours after you started the collection (For example - You would stop at 7AM the day after you started)

## 2018-12-26 NOTE — Progress Notes (Signed)
Name: [name]  MRN/ DOB: [id number], June 08, 1941    Age/ Sex: 78 y.o., female     PCP: Debbrah Alar, NP   Reason for Endocrinology Evaluation: Hypercalcemia     Initial Endocrinology Clinic Visit: 05/30/2018    PATIENT IDENTIFIER: [name] is a 78 y.o., female with a past medical history ofHTN, CHF, SVT, Osteopenia and left foot drop . She has followed with [facility] since 05/30/2018 for consultative assistance with management of her Hypercalcemia .   HISTORICAL SUMMARY: The patient was noted to have intermittent hypercalcemia since 09/2008. This has become more persistent in April, 2019. Pt also was noted to have an elevated PTH in 2013, 2014 and in 2018.   On her initial presentation she was asymptomatic She was taking 4000 IU vitamin D supplements, she was found to have hypervitaminosis D at 107.19 NG per mL, this was reduced to 1000 IU daily.  She has been off HCTZ since 2016.  DEXA scan showed osteopenia in 2010  SUBJECTIVE:   Today (12/26/2018):  [name] is here for 24-monthfollow-up on hypercalcemia. Since her last visit she denies renal stones,  polydipsia or constipation, she has polyuria due to diuretic use.  She is off Vitamin D     ROS:  As per HPI.   HISTORY:  Past Medical History:  Past Medical History:  Diagnosis Date  . Allergic rhinitis   . Ankle fracture    Left, Mortise Widening  . Arthritis    "knuckles" (06/17/2012)  . 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 [place]    "right" (06/17/2012)  . Cataracts, bilateral   . Dyspnea    with exertion  . Exertional dyspnea   . GERD (gastroesophageal reflux disease)   . Hypertension   . Iron deficiency anemia    "hematologist watches it" (06/17/2012)  . Obesity   . OSA treated with BiPAP 03/08/2016  . Osteoarthritis    severe right hip osteoarthritis-s/p hip replacement  . Osteopenia 11/26/2009  . Thyroid disorder    "sees endocrinologist  yearly" (06/17/2012)   Past Surgical History:  Past Surgical History:  Procedure Laterality Date  . ANKLE FRACTURE SURGERY Right 01/01/14   [facility] pins. [facility] ([facility])  . COLONOSCOPY W/ POLYPECTOMY    . EYE SURGERY  2011   cataract removal both eyes  . HIP CLOSED REDUCTION  07/26/2012   Procedure: CLOSED MANIPULATION HIP;  Surgeon: JNita Sells MD;  [facility];  Service: Orthopedics;  Laterality: Right;  . LUMBAR LAMINECTOMY  10/26/2017  . LUMBAR LAMINECTOMY/DECOMPRESSION MICRODISCECTOMY Bilateral 10/25/2017   Procedure: LUMBAR 2-3, LUMBAR 3-4 DECOMPRESSION TIME REQUESTED 4.5 HOURS;  Surgeon: DPhylliss Bob MD;  [facility]  Service: Orthopedics;  Laterality: Bilateral;  . MASTECTOMY  1993?   bilateral mastectomy  . ORIF ANKLE FRACTURE Left 07/30/2017   Procedure: LEFT OPEN REDUCTION INTERNAL FIXATION (ORIF) ANKLE FRACTURE WITH SYDESMOTIC FIXATION;  Surgeon: GDorna Leitz MD;  [facility]  Service: Orthopedics;  Laterality: Left;  . TONSILLECTOMY  1949  . TOTAL HIP ARTHROPLASTY  04/2006   right hip   . TOTAL HIP REVISION  06/17/2012   "right" (06/17/2012)  . TOTAL HIP REVISION  06/17/2012   Procedure: TOTAL HIP REVISION;  Surgeon: FKerin Salen MD;  [facility]  Service: Orthopedics;  Laterality: Right;    Social History:  reports that she quit smoking about 44  years ago. Her smoking use included cigarettes. She has a 5.00 pack-year smoking history. She has never used smokeless tobacco. She reports previous alcohol use. She reports that she does not use drugs. Family History:  Family History  Problem Relation Age of Onset  . Heart failure Father        deceased age 49  . Diabetes Father   . Alcohol abuse Father   . Breast cancer Mother        deceased at age 33 secondary to breast cancer  . Liver disease Sister        Liver failure--deceased  . Dementia Maternal Grandmother    . Colon cancer Neg Hx   . Esophageal cancer Neg Hx   . Rectal cancer Neg Hx   . Stomach cancer Neg Hx      HOME MEDICATIONS: Allergies as of 12/26/2018      Reactions   Amlodipine Swelling   Swelling   Sulfonamide Derivatives Swelling   REACTION: Swelling of the face; "eyes swelled shut"   Chocolate Other (See Comments)   Sinus problems      Medication List       Accurate as of December 26, 2018 12:53 PM. If you have any questions, ask your nurse or doctor.        STOP taking these medications   cholecalciferol 25 MCG (1000 UT) tablet Commonly known as: VITAMIN D3     TAKE these medications   Aspercreme Lidocaine 4 % Ptch Generic drug: Lidocaine Apply 1 patch topically daily as needed (pain).   bisoprolol 10 MG tablet Commonly known as: ZEBETA Take 2 tablets (20 mg total) by mouth daily.   fexofenadine 180 MG tablet Commonly known as: ALLEGRA Take 180 mg by mouth daily as needed. For seasonal allergies   Fish Oil 1200 MG Caps Take by mouth.   FLUoxetine 10 MG capsule Commonly known as: PROZAC TAKE 1 CAPSULE BY MOUTH EVERY DAY   fluticasone 50 MCG/ACT nasal spray Commonly known as: FLONASE USE 1 SPRAY IN EACH NOSTRIL EVERY DAY What changed: See the new instructions.   furosemide 20 MG tablet Commonly known as: LASIX Take 1 tablet every morning, 1 tablet after 3pm and may take 1 tablet every evening as needed for swelling.   gabapentin 100 MG capsule Commonly known as: NEURONTIN TAKE 1 CAPSULE BY MOUTH THREE TIMES A DAY   losartan 100 MG tablet Commonly known as: COZAAR Take 1 tablet (100 mg total) by mouth daily.   omeprazole 20 MG capsule Commonly known as: PRILOSEC Take 1 capsule (20 mg total) by mouth daily.   potassium chloride SA 20 MEQ tablet Commonly known as: K-DUR TAKE 1 TABLET EVERY DAY   simvastatin 10 MG tablet Commonly known as: ZOCOR Take 1 tablet (10 mg total) by mouth daily.   SYSTANE OP Apply 1 drop to eye daily as needed  (dry eyes).         OBJECTIVE:   PHYSICAL EXAM: VS: BP 138/72 (BP Location: Left Arm, Patient Position: Sitting, Cuff Size: Large)   Pulse 68   Temp 98.2 F (36.8 C)   Ht '5\' 6"'$  (1.676 m)   Wt 195 lb 6.4 oz (88.6 kg)   LMP 07/11/1991   SpO2 98%   BMI 31.54 kg/m    EXAM: General: Pt appears well and is in NAD  Neck: General: Supple without adenopathy. Thyroid: Thyroid size normal.  No goiter or nodules appreciated. No thyroid bruit.  Lungs: Clear with good BS  bilat with no rales, rhonchi, or wheezes  Heart: Auscultation: RRR.  Extremities:  BL LE: Pretibial non-pitting edema present  Mental Status: Judgment, insight: Intact Orientation: Oriented to time, place, and person Mood and affect: No depression, anxiety, or agitation     DATA REVIEWED:  Results for Courtney, Owens (MRN [id number]) as of 12/27/2018 14:47  Ref. Range 12/25/2018 08:54 12/26/2018 11:37  Sodium Latest Ref Range: 135 - 145 mEq/L 137   Potassium Latest Ref Range: 3.5 - 5.1 mEq/L 5.0   Chloride Latest Ref Range: 96 - 112 mEq/L 103   CO2 Latest Ref Range: 19 - 32 mEq/L 26   Glucose Latest Ref Range: 70 - 99 mg/dL 91   BUN Latest Ref Range: 6 - 23 mg/dL 39 (H)   Creatinine Latest Ref Range: 0.40 - 1.20 mg/dL 1.54 (H)   Calcium Latest Ref Range: 8.4 - 10.5 mg/dL 10.6 (H)   Alkaline Phosphatase Latest Ref Range: 39 - 117 U/L 62   Albumin Latest Ref Range: 3.5 - 5.2 g/dL 4.4   AST Latest Ref Range: 0 - 37 U/L 19   ALT Latest Ref Range: 0 - 35 U/L 12   Total Protein Latest Ref Range: 6.0 - 8.3 g/dL 6.8   Total Bilirubin Latest Ref Range: 0.2 - 1.2 mg/dL 0.6   GFR Latest Ref Range: >60.00 mL/min 32.77 (L)   Total CHOL/HDL Ratio Unknown 2   Cholesterol Latest Ref Range: 0 - 200 mg/dL 156   HDL Cholesterol Latest Ref Range: >39.00 mg/dL 68.80   LDL (calc) Latest Ref Range: 0 - 99 mg/dL 73   NonHDL Unknown 87.09   Triglycerides Latest Ref Range: 0.0 - 149.0 mg/dL 71.0   VLDL Latest Ref Range:  0.0 - 40.0 mg/dL 14.2   VITD Latest Ref Range: 30.00 - 100.00 ng/mL  89.96       ASSESSMENT / PLAN / RECOMMENDATIONS:   1. 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 - If pt has pHPT, she will need to have parathyroidectomy based on low GFR of less than 60 mL/min - Criteria for surgical intervention (Serum calcium concentration of 1.0 mg/dL or more above the upper limit of normal,Estimated glomerular filtration rate (eGFR) <60 mL/min, evidence of Osteoporosis on bone density, Twenty-four-hour urinary calcium >300 mg/day ,Nephrolithiasis or nephrocalcinosis by radiograph, Age less than 50 years.) -Repeat BMP today shows serum calcium at  10.6 mg/dL, her vitamin D levels have reduced to normal levels  - Will proceed with 24-urine collection   2.  Hypervitaminosis D:  - Normalized. Continue to stay off Vitamin D     Follow-up in 3 months     Signed electronically by: Mack Guise, MD  [facility]  [facility] [address] [place], [place] [address] Phone: [phone] FAX: [phone]      CC: Debbrah Alar, [address] [place] [address] Phone: [phone]  Fax: [phone]   Return to Endocrinology clinic as below: Future Appointments  Date Time Provider [place]  03/25/2019  1:00 PM Dennis Bast, RN [facility]  06/26/2019 11:10 AM Michon Kaczmarek, Melanie Crazier, MD [facility] None

## 2018-12-27 ENCOUNTER — Encounter: Payer: Self-pay | Admitting: Family

## 2019-01-03 ENCOUNTER — Other Ambulatory Visit: Payer: Self-pay | Admitting: Family

## 2019-01-09 ENCOUNTER — Other Ambulatory Visit: Payer: Self-pay

## 2019-01-09 ENCOUNTER — Other Ambulatory Visit (INDEPENDENT_AMBULATORY_CARE_PROVIDER_SITE_OTHER): Payer: Medicare Other

## 2019-01-10 LAB — CALCIUM, URINE, 24 HOUR: Calcium, 24H Urine: 16 mg/24 h — ABNORMAL LOW

## 2019-01-15 ENCOUNTER — Other Ambulatory Visit: Payer: Self-pay | Admitting: *Deleted

## 2019-03-22 ENCOUNTER — Encounter: Payer: Self-pay | Admitting: Family

## 2019-03-25 ENCOUNTER — Ambulatory Visit: Payer: Medicare Other | Admitting: *Deleted

## 2019-03-26 ENCOUNTER — Other Ambulatory Visit: Payer: Self-pay | Admitting: Family

## 2019-03-31 ENCOUNTER — Other Ambulatory Visit: Payer: Self-pay | Admitting: Family

## 2019-04-04 DIAGNOSIS — Z23 Encounter for immunization: Secondary | ICD-10-CM | POA: Diagnosis not present

## 2019-04-10 ENCOUNTER — Other Ambulatory Visit: Payer: Self-pay | Admitting: Family

## 2019-04-30 NOTE — Progress Notes (Signed)
Virtual Visit via Video Note  I connected with patient on 05/01/19 at 11:00 AM EDT by audio enabled telemedicine application and verified that I am speaking with the correct person using two identifiers.   THIS ENCOUNTER IS A VIRTUAL VISIT DUE TO COVID-19 - PATIENT WAS NOT SEEN IN THE OFFICE. PATIENT HAS CONSENTED TO VIRTUAL VISIT / TELEMEDICINE VISIT   Location of patient: home  Location of provider: office  I discussed the limitations of evaluation and management by telemedicine and the availability of in person appointments. The patient expressed understanding and agreed to proceed.   Subjective:   [name] is a 78 y.o. female who presents for Medicare Annual (Subsequent) preventive examination.  Review of Systems:   Home Safety/Smoke Alarms: Feels safe in home. Smoke alarms in place.  Lives alone with dog in 1 story home. Walk in shower w/ bench and grab bar. Uses cane. Dogs walker comes 3x/ week. Pt states she [place] in [place] community and has great neighbors.   Female:       Mammo- hx double mastectomy       Dexa scan-  08/17/16. Pt states she will schedule this Feb 2021  CCS- next due 09/2020     Objective:     Vitals: BP 130/75 Comment: pt reported vitals  Pulse (!) 57   Wt 195 lb (88.5 kg)   LMP 07/11/1991   SpO2 98%   BMI 31.47 kg/m   Body mass index is 31.47 kg/m.  Advanced Directives 05/01/2019 03/19/2018 10/18/2017 07/30/2017 08/10/2016 12/23/2015 09/06/2015  Does Patient Have a Medical Advance Directive? Yes Yes Yes Yes Yes No Yes  Type of Paramedic of [place];Living will [place];Living will [place];Living will Living will [place];Living will - [place]  Does patient want to make changes to medical advance directive? No - Patient declined - - No - Patient declined - - -  Copy of [place] in Chart? Yes - validated most  recent copy scanned in chart (See row information) Yes Yes - Yes - -  Would patient like information on creating a medical advance directive? - - - - - No - patient declined information -  Pre-existing out of facility DNR order (yellow form or pink MOST form) - - - - - - -    Tobacco Social History   Tobacco Use  Smoking Status Former Smoker  . Packs/day: 0.50  . Years: 10.00  . Pack years: 5.00  . Types: Cigarettes  . Quit date: 07/14/1974  . Years since quitting: 44.8  Smokeless Tobacco Never Used  Tobacco Comment   quit in 1976 smoked for approx. 10 years     Counseling given: Not Answered Comment: quit in 1976 smoked for approx. 10 years   Clinical Intake: Pain : No/denies pain    Past Medical History:  Diagnosis Date  . Allergic rhinitis   . Ankle fracture    Left, [place]  . Arthritis    "knuckles" (06/17/2012)  . 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 Breast cancer ([place])    "right" (06/17/2012)  . Cataracts, bilateral   . Dyspnea    with exertion  . Exertional dyspnea   . GERD (gastroesophageal reflux disease)   . Hypertension   . Iron deficiency anemia    "hematologist watches it" (06/17/2012)  . Obesity   . OSA treated with BiPAP 03/08/2016  . Osteoarthritis  severe right hip osteoarthritis-s/p hip replacement  . Osteopenia 11/26/2009  . Thyroid disorder    "sees endocrinologist yearly" (06/17/2012)   Past Surgical History:  Procedure Laterality Date  . ANKLE FRACTURE SURGERY Right 01/01/14   Has pins. Procedure performed at [facility]   "bilaterlly" (06/17/2012)  . COLONOSCOPY W/ POLYPECTOMY    . EYE SURGERY  2011   cataract removal both eyes  . HIP CLOSED REDUCTION  07/26/2012   Procedure: CLOSED MANIPULATION HIP;  Surgeon: Nita Sells, MD;  Location: [facility];  Service: Orthopedics;  Laterality: Right;  . LUMBAR LAMINECTOMY  10/26/2017  . LUMBAR LAMINECTOMY/DECOMPRESSION MICRODISCECTOMY  Bilateral 10/25/2017   Procedure: LUMBAR 2-3, LUMBAR 3-4 DECOMPRESSION TIME REQUESTED 4.5 HOURS;  Surgeon: Phylliss Bob, MD;  Location: [facility];  Service: Orthopedics;  Laterality: Bilateral;  . MASTECTOMY  1993?   bilateral mastectomy  . ORIF ANKLE FRACTURE Left 07/30/2017   Procedure: LEFT OPEN REDUCTION INTERNAL FIXATION (ORIF) ANKLE FRACTURE WITH SYDESMOTIC FIXATION;  Surgeon: Dorna Leitz, MD;  Location: [facility];  Service: Orthopedics;  Laterality: Left;  . TONSILLECTOMY  1949  . TOTAL HIP ARTHROPLASTY  04/2006   right hip   . TOTAL HIP REVISION  06/17/2012   "right" (06/17/2012)  . TOTAL HIP REVISION  06/17/2012   Procedure: TOTAL HIP REVISION;  Surgeon: Kerin Salen, MD;  Location: [facility];  Service: Orthopedics;  Laterality: Right;   Family History  Problem Relation Age of Onset  . Heart failure Father        deceased age 65  . Diabetes Father   . Alcohol abuse Father   . Breast cancer Mother        deceased at age 73 secondary to breast cancer  . Liver disease Sister        Liver failure--deceased  . Dementia Maternal Grandmother   . Colon cancer Neg Hx   . Esophageal cancer Neg Hx   . Rectal cancer Neg Hx   . Stomach cancer Neg Hx    Social History   Socioeconomic History  . Marital status: Widowed    Spouse name: Not on file  . Number of children: Not on file  . Years of education: Not on file  . Highest education level: Not on file  Occupational History  . Not on file  Social Needs  . Financial resource strain: Not hard at all  . Food insecurity    Worry: Never true    Inability: Never true  . Transportation needs    Medical: No    Non-medical: No  Tobacco Use  . Smoking status: Former Smoker    Packs/day: 0.50    Years: 10.00    Pack years: 5.00    Types: Cigarettes    Quit date: 07/14/1974    Years since quitting: 44.8  . Smokeless tobacco: Never Used  . Tobacco comment: quit in 1976 smoked for approx. 10 years  Substance and Sexual Activity  . Alcohol  use: Yes    Comment: wine on occasion. rare.   . Drug use: No  . Sexual activity: Never  Lifestyle  . Physical activity    Days per week: Not on file    Minutes per session: Not on file  . Stress: Not on file  Relationships  . Social Herbalist on phone: Not on file    Gets together: Not on file    Attends religious service: Not on file  Active member of club or organization: Not on file    Attends meetings of clubs or organizations: Not on file    Relationship status: Not on file  Other Topics Concern  . Not on file  Social History Narrative   Retired   The patient is married and has one child and two grandchildren that live in the area.     She drinks wine with dinner.  She quit smoking in 1976, smoked for   approximately 10 years.      Moved to [place] from [place], [place]          Outpatient Encounter Medications as of 05/01/2019  Medication Sig  . bisoprolol (ZEBETA) 10 MG tablet TAKE 2 TABLETS EVERY DAY  . fexofenadine (ALLEGRA) 180 MG tablet Take 180 mg by mouth daily as needed. For seasonal allergies  . FLUoxetine (PROZAC) 10 MG capsule TAKE 1 CAPSULE BY MOUTH EVERY DAY  . furosemide (LASIX) 20 MG tablet TAKE 1 TABLET EVERY MORNING, 1 TABLET AFTER 3PM AND MAY TAKE 1 TABLET EVERY EVENING AS NEEDED FOR SWELLING.  . gabapentin (NEURONTIN) 100 MG capsule TAKE 1 CAPSULE BY MOUTH THREE TIMES A DAY  . Lidocaine (ASPERCREME LIDOCAINE) 4 % PTCH Apply 1 patch topically daily as needed (pain).  Marland Kitchen losartan (COZAAR) 100 MG tablet TAKE 1 TABLET EVERY DAY  . Omega-3 Fatty Acids (FISH OIL) 1200 MG CAPS Take by mouth.  Marland Kitchen omeprazole (PRILOSEC) 20 MG capsule TAKE 1 CAPSULE (20 MG TOTAL) BY MOUTH DAILY.  Vladimir Faster Glycol-Propyl Glycol (SYSTANE OP) Apply 1 drop to eye daily as needed (dry eyes).  . potassium chloride SA (K-DUR) 20 MEQ tablet TAKE 1 TABLET EVERY DAY  . simvastatin (ZOCOR) 10 MG tablet TAKE 1 TABLET EVERY DAY  . fluticasone (FLONASE) 50 MCG/ACT nasal spray  USE 1 SPRAY IN EACH NOSTRIL EVERY DAY (Patient not taking: Reported on 05/01/2019)   No facility-administered encounter medications on file as of 05/01/2019.     Activities of Daily Living In your present state of health, do you have any difficulty performing the following activities: 05/01/2019  Hearing? N  Vision? N  Difficulty concentrating or making decisions? N  Walking or climbing stairs? N  Dressing or bathing? N  Doing errands, shopping? N  Preparing Food and eating ? N  Using the Toilet? N  In the past six months, have you accidently leaked urine? Y  Do you have problems with loss of bowel control? N  Managing your Medications? N  Managing your Finances? N  Housekeeping or managing your Housekeeping? N  Some recent data might be hidden    Patient Care Team: Debbrah Alar, NP as PCP - General (Internal Medicine) Marin Olp Rudell Cobb, MD as Consulting Physician (Hematology and Oncology) Marygrace Drought, MD as Consulting Physician (Ophthalmology) Frederik Pear, MD as Consulting Physician (Orthopedic Surgery) Debara Pickett Nadean Corwin, MD as Consulting Physician (Cardiology) Troy Sine, MD as Consulting Physician (Cardiology) Phylliss Bob, MD as Consulting Physician (Orthopedic Surgery)    Assessment:   This is a routine wellness examination for [place]. Physical assessment deferred to PCP.  Exercise Activities and Dietary recommendations Current Exercise Habits: Home exercise routine, Type of exercise: stretching(recumbent bike), Time (Minutes): 25, Frequency (Times/Week): 7, Weekly Exercise (Minutes/Week): 175, Intensity: Mild, Exercise limited by: None identified Diet (meal preparation, eat out, water intake, caffeinated beverages, dairy products, fruits and vegetables): 24 hr recall Breakfast: oatmeal and tangerine, coffee Lunch: ham and chs sandwich, pop Dinner:    NIKE, salad Pt  drinks at least 6 glasses of water per day.   Goals    . Increase physical  activity     Increase physical strength       Fall Risk Fall Risk  05/01/2019 05/01/2019 03/19/2018 08/10/2016 08/06/2015  Falls in the past year? 1 1 Yes Yes Yes  Comment - - - - Lost balance; walks with a cane  Number falls in past yr: 1 1 1 1 1   Injury with Fall? 0 0 Yes - Yes  Risk for fall due to : - - - History of fall(s);Impaired balance/gait Impaired balance/gait;Impaired mobility  Follow up - - Education provided;Falls prevention discussed Falls prevention discussed Education provided;Falls prevention discussed    Depression Screen PHQ 2/9 Scores 05/01/2019 03/19/2018 08/10/2016 08/06/2015  PHQ - 2 Score 0 1 4 1   PHQ- 9 Score - - 10 -     Cognitive Function Ad8 score reviewed for issues:  Issues making decisions:no  Less interest in hobbies / activities:no  Repeats questions, stories (family complaining):no  Trouble using ordinary gadgets (microwave, computer, phone):no  Forgets the month or year: no  Mismanaging finances: no  Remembering appts:no  Daily problems with thinking and/or memory:no Ad8 score is=0     MMSE - Mini Mental State Exam 08/10/2016 08/06/2015  Orientation to time 5 5  Orientation to Place 5 5  Registration 3 3  Attention/ Calculation 5 5  Recall 3 3  Language- name 2 objects 2 2  Language- repeat 1 1  Language- follow 3 step command 3 3  Language- read & follow direction 1 1  Write a sentence 1 1  Copy design 1 1  Total score 30 30        Immunization History  Administered Date(s) Administered  . Influenza Split 03/22/2012  . Influenza Whole 04/28/2008, 05/04/2009  . Influenza, High Dose Seasonal PF 04/01/2015, 03/27/2016, 03/13/2017, 04/02/2018, 04/04/2019  . Influenza,inj,Quad PF,6+ Mos 03/19/2013, 03/25/2014  . Pneumococcal Conjugate-13 06/25/2013  . Pneumococcal Polysaccharide-23 05/27/2001, 06/18/2012  . Td 05/26/2008  . Zoster 12/01/2010    Screening Tests Health Maintenance  Topic Date Due  . TETANUS/TDAP   05/26/2018  . COLONOSCOPY  09/05/2020  . INFLUENZA VACCINE  Completed  . DEXA SCAN  Completed  . PNA vac Low Risk Adult  Completed      Plan:   See you next year!  Keep up the great work!  I have personally reviewed and noted the following in the patient's chart:   . Medical and social history . Use of alcohol, tobacco or illicit drugs  . Current medications and supplements . Functional ability and status . Nutritional status . Physical activity . Advanced directives . List of other physicians . Hospitalizations, surgeries, and ER visits in previous 12 months . Vitals . Screenings to include cognitive, depression, and falls . Referrals and appointments  In addition, I have reviewed and discussed with patient certain preventive protocols, quality metrics, and best practice recommendations. A written personalized care plan for preventive services as well as general preventive health recommendations were provided to patient.     Shela Nevin, [place]  05/01/2019

## 2019-05-01 ENCOUNTER — Ambulatory Visit (INDEPENDENT_AMBULATORY_CARE_PROVIDER_SITE_OTHER): Payer: Medicare Other | Admitting: *Deleted

## 2019-05-01 ENCOUNTER — Encounter: Payer: Self-pay | Admitting: *Deleted

## 2019-05-01 VITALS — BP 130/75 | HR 57 | Wt 195.0 lb

## 2019-05-01 DIAGNOSIS — Z0000 Encounter for general adult medical examination without abnormal findings: Secondary | ICD-10-CM

## 2019-05-01 NOTE — Patient Instructions (Signed)
See you next year!  Keep up the great work!   [name] , Thank you for taking time to come for your Medicare Wellness Visit. I appreciate your ongoing commitment to your health goals. Please review the following plan we discussed and let me know if I can assist you in the future.   These are the goals we discussed: Goals    . Increase physical activity     Increase physical strength       This is a list of the screening recommended for you and due dates:  Health Maintenance  Topic Date Due  . Tetanus Vaccine  05/26/2018  . Colon Cancer Screening  09/05/2020  . Flu Shot  Completed  . DEXA scan (bone density measurement)  Completed  . Pneumonia vaccines  Completed    Health Maintenance After Age 62 After age 55, you are at a higher risk for certain long-term diseases and infections as well as injuries from falls. Falls are a major cause of broken bones and head injuries in people who are older than age 35. Getting regular preventive care can help to keep you healthy and well. Preventive care includes getting regular testing and making lifestyle changes as recommended by your health care provider. Talk with your health care provider about:  Which screenings and tests you should have. A screening is a test that checks for a disease when you have no symptoms.  A diet and exercise plan that is right for you. What should I know about screenings and tests to prevent falls? Screening and testing are the best ways to find a health problem early. Early diagnosis and treatment give you the best chance of managing medical conditions that are common after age 53. Certain conditions and lifestyle choices may make you more likely to have a fall. Your health care provider may recommend:  Regular vision checks. Poor vision and conditions such as cataracts can make you more likely to have a fall. If you wear glasses, make sure to get your prescription updated if your vision changes.  Medicine  review. Work with your health care provider to regularly review all of the medicines you are taking, including over-the-counter medicines. Ask your health care provider about any side effects that may make you more likely to have a fall. Tell your health care provider if any medicines that you take make you feel dizzy or sleepy.  Osteoporosis screening. Osteoporosis is a condition that causes the bones to get weaker. This can make the bones weak and cause them to break more easily.  Blood pressure screening. Blood pressure changes and medicines to control blood pressure can make you feel dizzy.  Strength and balance checks. Your health care provider may recommend certain tests to check your strength and balance while standing, walking, or changing positions.  Foot health exam. Foot pain and numbness, as well as not wearing proper footwear, can make you more likely to have a fall.  Depression screening. You may be more likely to have a fall if you have a fear of falling, feel emotionally low, or feel unable to do activities that you used to do.  Alcohol use screening. Using too much alcohol can affect your balance and may make you more likely to have a fall. What actions can I take to lower my risk of falls? General instructions  Talk with your health care provider about your risks for falling. Tell your health care provider if: ? You fall. Be sure to tell  your health care provider about all falls, even ones that seem minor. ? You feel dizzy, sleepy, or off-balance.  Take over-the-counter and prescription medicines only as told by your health care provider. These include any supplements.  Eat a healthy diet and maintain a healthy weight. A healthy diet includes low-fat dairy products, low-fat (lean) meats, and fiber from whole grains, beans, and lots of fruits and vegetables. Home safety  Remove any tripping hazards, such as rugs, cords, and clutter.  Install safety equipment such as grab  bars in bathrooms and safety rails on stairs.  Keep rooms and walkways well-lit. Activity   Follow a regular exercise program to stay fit. This will help you maintain your balance. Ask your health care provider what types of exercise are appropriate for you.  If you need a cane or walker, use it as recommended by your health care provider.  Wear supportive shoes that have nonskid soles. Lifestyle  Do not drink alcohol if your health care provider tells you not to drink.  If you drink alcohol, limit how much you have: ? 0-1 drink a day for women. ? 0-2 drinks a day for men.  Be aware of how much alcohol is in your drink. In the [place] one drink equals one typical bottle of beer (12 oz), one-half glass of wine (5 oz), or one shot of hard liquor (1 oz).  Do not use any products that contain nicotine or tobacco, such as cigarettes and e-cigarettes. If you need help quitting, ask your health care provider. Summary  Having a healthy lifestyle and getting preventive care can help to protect your health and wellness after age 27.  Screening and testing are the best way to find a health problem early and help you avoid having a fall. Early diagnosis and treatment give you the best chance for managing medical conditions that are more common for people who are older than age 39.  Falls are a major cause of broken bones and head injuries in people who are older than age 46. Take precautions to prevent a fall at home.  Work with your health care provider to learn what changes you can make to improve your health and wellness and to prevent falls. This information is not intended to replace advice given to you by your health care provider. Make sure you discuss any questions you have with your health care provider. Document Released: 05/09/2017 Document Revised: 10/17/2018 Document Reviewed: 05/09/2017 Elsevier Patient Education  2020 [employer].

## 2019-06-09 ENCOUNTER — Other Ambulatory Visit: Payer: Self-pay | Admitting: Family

## 2019-06-09 NOTE — Telephone Encounter (Signed)
Can you please clarify with pt if she is taking?    Also, let's have her do a virtual follow up please.

## 2019-06-09 NOTE — Telephone Encounter (Signed)
[name] -- last OV in April said pt GERD was stable off of PPI. Please advise Omeprazole request? Also, when is pt due for follow up?

## 2019-06-10 ENCOUNTER — Other Ambulatory Visit: Payer: Self-pay | Admitting: Family

## 2019-06-10 NOTE — Telephone Encounter (Signed)
Patient scheduled for Monday at 11 am for virtual facetime visit

## 2019-06-11 NOTE — Telephone Encounter (Signed)
Per patient she tried to dc medication but kept having reflux. She restarted medication due to her symptoms.

## 2019-06-11 NOTE — Telephone Encounter (Signed)
Lvm for patient to call back to verify if she is taking medication

## 2019-06-16 ENCOUNTER — Other Ambulatory Visit: Payer: Self-pay

## 2019-06-16 ENCOUNTER — Ambulatory Visit (INDEPENDENT_AMBULATORY_CARE_PROVIDER_SITE_OTHER): Payer: Medicare Other | Admitting: Family

## 2019-06-16 DIAGNOSIS — I10 Essential (primary) hypertension: Secondary | ICD-10-CM | POA: Diagnosis not present

## 2019-06-16 DIAGNOSIS — F329 Major depressive disorder, single episode, unspecified: Secondary | ICD-10-CM

## 2019-06-16 DIAGNOSIS — F32A Depression, unspecified: Secondary | ICD-10-CM

## 2019-06-16 DIAGNOSIS — D649 Anemia, unspecified: Secondary | ICD-10-CM

## 2019-06-16 DIAGNOSIS — K219 Gastro-esophageal reflux disease without esophagitis: Secondary | ICD-10-CM | POA: Diagnosis not present

## 2019-06-16 DIAGNOSIS — R32 Unspecified urinary incontinence: Secondary | ICD-10-CM

## 2019-06-16 MED ORDER — MIRABEGRON ER 25 MG PO TB24: 25.0000 mg | ORAL_TABLET | Freq: Every day | ORAL | 5 refills | Status: DC

## 2019-06-16 NOTE — Progress Notes (Signed)
Virtual Visit via Video Note  I connected with [name] on 06/16/19 at 11:00 AM EST by a video enabled telemedicine application and verified that I am speaking with the correct person using two identifiers.  Location: Patient: home Provider: home   I discussed the limitations of evaluation and management by telemedicine and the availability of in person appointments. The patient expressed understanding and agreed to proceed.  History of Present Illness:  Patient is a 78 yr old female who presents today for follow up.  HTN- 150/88, usually lower. Maintained on losartan.   BP Readings from Last 3 Encounters:  05/01/19 130/75  12/26/18 138/72  09/17/18 (!) 148/78   Urinary incontinence- Reports that she sometimes will have incontinence of stool and urine.  This is intermittent. Last episode of urinary incontinence was yesterday.  Stool incontinence was about 3 days ago. Stool incontinence occurs once every few weeks.  Always loose stool when this occurs.  Denies dysuria/frequency.   Hyperlipidemia- maintained on simvastatin.   Lab Results  Component Value Date   CHOL 156 12/25/2018   HDL 68.80 12/25/2018   LDLCALC 73 12/25/2018   LDLDIRECT 110.4 12/13/2006   TRIG 71.0 12/25/2018   CHOLHDL 2 12/25/2018   Reports pain is well controlled with gabapentin and tylenol. No longer needing NSAIDS.    Depression- reports that her mood has been a little down. She continues prozac.    GERD- pt reports that she is trying to come off of omeprazole. She has cut down from 20mg  and then tried to stop and had some breakthrough symptoms.   Past Medical History:  Diagnosis Date  . Allergic rhinitis   . Ankle fracture    Left, Mortise Widening  . Arthritis    "knuckles" (06/17/2012)  . 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 Breast cancer ([place])    "right" (06/17/2012)  . Cataracts, bilateral   . Dyspnea    with exertion  . Exertional dyspnea   . GERD (gastroesophageal reflux  disease)   . Hypertension   . Iron deficiency anemia    "hematologist watches it" (06/17/2012)  . Obesity   . OSA treated with BiPAP 03/08/2016  . Osteoarthritis    severe right hip osteoarthritis-s/p hip replacement  . Osteopenia 11/26/2009  . Thyroid disorder    "sees endocrinologist yearly" (06/17/2012)     Social History   Socioeconomic History  . Marital status: Widowed    Spouse name: Not on file  . Number of children: Not on file  . Years of education: Not on file  . Highest education level: Not on file  Occupational History  . Not on file  Social Needs  . Financial resource strain: Not hard at all  . Food insecurity    Worry: Never true    Inability: Never true  . Transportation needs    Medical: No    Non-medical: No  Tobacco Use  . Smoking status: Former Smoker    Packs/day: 0.50    Years: 10.00    Pack years: 5.00    Types: Cigarettes    Quit date: 07/14/1974    Years since quitting: 44.9  . Smokeless tobacco: Never Used  . Tobacco comment: quit in 1976 smoked for approx. 10 years  Substance and Sexual Activity  . Alcohol use: Yes    Comment: wine on occasion. rare.   . Drug use: No  . Sexual activity: Never  Lifestyle  . Physical activity    Days per week: Not on file  Minutes per session: Not on file  . Stress: Not on file  Relationships  . Social Herbalist on phone: Not on file    Gets together: Not on file    Attends religious service: Not on file    Active member of club or organization: Not on file    Attends meetings of clubs or organizations: Not on file    Relationship status: Not on file  . Intimate partner violence    Fear of current or ex partner: Not on file    Emotionally abused: Not on file    Physically abused: Not on file    Forced sexual activity: Not on file  Other Topics Concern  . Not on file  Social History Narrative   Retired   The patient is married and has one child and two grandchildren that live in the  area.     She drinks wine with dinner.  She quit smoking in 1976, smoked for   approximately 10 years.      Moved to [place] from [place]          Past Surgical History:  Procedure Laterality Date  . ANKLE FRACTURE SURGERY Right 01/01/14   Has pins. Procedure performed at [facility]   "bilaterlly" (06/17/2012)  . COLONOSCOPY W/ POLYPECTOMY    . EYE SURGERY  2011   cataract removal both eyes  . HIP CLOSED REDUCTION  07/26/2012   Procedure: CLOSED MANIPULATION HIP;  Surgeon: Nita Sells, MD;  Location: [facility];  Service: Orthopedics;  Laterality: Right;  . LUMBAR LAMINECTOMY  10/26/2017  . LUMBAR LAMINECTOMY/DECOMPRESSION MICRODISCECTOMY Bilateral 10/25/2017   Procedure: LUMBAR 2-3, LUMBAR 3-4 DECOMPRESSION TIME REQUESTED 4.5 HOURS;  Surgeon: Phylliss Bob, MD;  Location: [facility];  Service: Orthopedics;  Laterality: Bilateral;  . MASTECTOMY  1993?   bilateral mastectomy  . ORIF ANKLE FRACTURE Left 07/30/2017   Procedure: LEFT OPEN REDUCTION INTERNAL FIXATION (ORIF) ANKLE FRACTURE WITH SYDESMOTIC FIXATION;  Surgeon: Dorna Leitz, MD;  Location: [facility];  Service: Orthopedics;  Laterality: Left;  . TONSILLECTOMY  1949  . TOTAL HIP ARTHROPLASTY  04/2006   right hip   . TOTAL HIP REVISION  06/17/2012   "right" (06/17/2012)  . TOTAL HIP REVISION  06/17/2012   Procedure: TOTAL HIP REVISION;  Surgeon: Kerin Salen, MD;  Location: [facility];  Service: Orthopedics;  Laterality: Right;    Family History  Problem Relation Age of Onset  . Heart failure Father        deceased age 79  . Diabetes Father   . Alcohol abuse Father   . Breast cancer Mother        deceased at age 56 secondary to breast cancer  . Liver disease Sister        Liver failure--deceased  . Dementia Maternal Grandmother   . Colon cancer Neg Hx   . Esophageal cancer Neg Hx   . Rectal cancer Neg Hx   . Stomach cancer Neg Hx     Allergies  Allergen Reactions  . Amlodipine  Swelling    Swelling   . Sulfonamide Derivatives Swelling    REACTION: Swelling of the face; "eyes swelled shut"  . Chocolate Other (See Comments)    Sinus problems    Current Outpatient Medications on File Prior to Visit  Medication Sig Dispense Refill  . bisoprolol (ZEBETA) 10 MG tablet TAKE 2 TABLETS EVERY DAY 180 tablet 1  .  fexofenadine (ALLEGRA) 180 MG tablet Take 180 mg by mouth daily as needed. For seasonal allergies    . FLUoxetine (PROZAC) 10 MG capsule TAKE 1 CAPSULE BY MOUTH EVERY DAY 90 capsule 1  . fluticasone (FLONASE) 50 MCG/ACT nasal spray USE 1 SPRAY IN EACH NOSTRIL EVERY DAY 32 g 1  . furosemide (LASIX) 20 MG tablet TAKE 1 TABLET EVERY MORNING, 1 TABLET AFTER 3PM AND MAY TAKE 1 TABLET EVERY EVENING AS NEEDED FOR SWELLING. 270 tablet 0  . gabapentin (NEURONTIN) 100 MG capsule TAKE 1 CAPSULE BY MOUTH THREE TIMES A DAY 270 capsule 2  . Lidocaine (ASPERCREME LIDOCAINE) 4 % PTCH Apply 1 patch topically daily as needed (pain).    Marland Kitchen losartan (COZAAR) 100 MG tablet TAKE 1 TABLET EVERY DAY 90 tablet 0  . Omega-3 Fatty Acids (FISH OIL) 1200 MG CAPS Take by mouth.    Marland Kitchen omeprazole (PRILOSEC) 20 MG capsule TAKE 1 CAPSULE EVERY DAY 90 capsule 1  . Polyethyl Glycol-Propyl Glycol (SYSTANE OP) Apply 1 drop to eye daily as needed (dry eyes).    . potassium chloride SA (K-DUR) 20 MEQ tablet TAKE 1 TABLET EVERY DAY 90 tablet 1  . simvastatin (ZOCOR) 10 MG tablet TAKE 1 TABLET EVERY DAY 90 tablet 0   No current facility-administered medications on file prior to visit.     LMP 07/11/1991       Observations/Objective:   Gen: Awake, alert, no acute distress Resp: Breathing is even and non-labored Psych: calm/pleasant demeanor Neuro: Alert and Oriented x 3, + facial symmetry, speech is clear.   Assessment and Plan:  Diarrhea- suspect related to dairy (lactose intolerance). Suggested that she try avoiding dairy to see if this helps.  Urinary Incontinence- will obtain  urinalysis, urine culture to rule out UTI and give trial of myrbetriq.  Depression- fair control.  Will continue prozac. I have advised the pt to let me know if her depression symptoms worsen or if she would like to increase her prozac.   HTN- BP is acceptable for her age. Continue current medication.  Hyperlipidemia- maintained on statin, LDL at goal, continue same.  GERD- stable on reduced omeprazole 20mg . Did not tolerate complete discontinuation.  Anemia- obtain follow up CBC, IFOB.    Follow Up Instructions:    I discussed the assessment and treatment plan with the patient. The patient was provided an opportunity to ask questions and all were answered. The patient agreed with the plan and demonstrated an understanding of the instructions.   The patient was advised to call back or seek an in-person evaluation if the symptoms worsen or if the condition fails to improve as anticipated.  Nance Pear, NP

## 2019-06-17 ENCOUNTER — Encounter: Payer: Self-pay | Admitting: Family

## 2019-06-18 ENCOUNTER — Other Ambulatory Visit: Payer: Self-pay | Admitting: Family

## 2019-06-20 ENCOUNTER — Other Ambulatory Visit (INDEPENDENT_AMBULATORY_CARE_PROVIDER_SITE_OTHER): Payer: Medicare Other

## 2019-06-20 ENCOUNTER — Other Ambulatory Visit: Payer: Self-pay

## 2019-06-20 DIAGNOSIS — D649 Anemia, unspecified: Secondary | ICD-10-CM

## 2019-06-20 DIAGNOSIS — R32 Unspecified urinary incontinence: Secondary | ICD-10-CM | POA: Diagnosis not present

## 2019-06-20 LAB — URINALYSIS, ROUTINE W REFLEX MICROSCOPIC
Bilirubin Urine: NEGATIVE
Hgb urine dipstick: NEGATIVE
Ketones, ur: NEGATIVE
Leukocytes,Ua: NEGATIVE
Nitrite: NEGATIVE
Specific Gravity, Urine: 1.01 (ref 1.000–1.030)
Total Protein, Urine: NEGATIVE
Urine Glucose: NEGATIVE
Urobilinogen, UA: 0.2 (ref 0.0–1.0)
pH: 6 (ref 5.0–8.0)

## 2019-06-20 LAB — CBC WITH DIFFERENTIAL/PLATELET
Basophils Absolute: 0.1 10*3/uL (ref 0.0–0.1)
Basophils Relative: 0.8 % (ref 0.0–3.0)
Eosinophils Absolute: 0.1 10*3/uL (ref 0.0–0.7)
Eosinophils Relative: 1.7 % (ref 0.0–5.0)
HCT: 31.9 % — ABNORMAL LOW (ref 36.0–46.0)
Hemoglobin: 10.6 g/dL — ABNORMAL LOW (ref 12.0–15.0)
Lymphocytes Relative: 28.7 % (ref 12.0–46.0)
Lymphs Abs: 2.3 10*3/uL (ref 0.7–4.0)
MCHC: 33.3 g/dL (ref 30.0–36.0)
MCV: 99.6 fl (ref 78.0–100.0)
Monocytes Absolute: 0.6 10*3/uL (ref 0.1–1.0)
Monocytes Relative: 7.4 % (ref 3.0–12.0)
Neutro Abs: 4.8 10*3/uL (ref 1.4–7.7)
Neutrophils Relative %: 61.4 % (ref 43.0–77.0)
Platelets: 249 10*3/uL (ref 150.0–400.0)
RBC: 3.21 Mil/uL — ABNORMAL LOW (ref 3.87–5.11)
RDW: 13 % (ref 11.5–15.5)
WBC: 7.9 10*3/uL (ref 4.0–10.5)

## 2019-06-20 LAB — FERRITIN: Ferritin: 24.6 ng/mL (ref 10.0–291.0)

## 2019-06-20 LAB — IRON: Iron: 71 ug/dL (ref 42–145)

## 2019-06-21 LAB — URINE CULTURE
MICRO NUMBER:: 1189590
SPECIMEN QUALITY:: ADEQUATE

## 2019-06-25 ENCOUNTER — Other Ambulatory Visit: Payer: Self-pay | Admitting: Family

## 2019-06-26 ENCOUNTER — Ambulatory Visit: Payer: Medicare Other | Admitting: Internal Medicine

## 2019-06-27 ENCOUNTER — Other Ambulatory Visit: Payer: Self-pay | Admitting: Family

## 2019-07-18 ENCOUNTER — Other Ambulatory Visit: Payer: Self-pay

## 2019-07-21 NOTE — Progress Notes (Signed)
Name: [name]  MRN/ DOB: [id number], [birth year]    Age/ Sex: 79 y.o., female     PCP: Debbrah Alar, NP   Reason for Endocrinology Evaluation: Hypercalcemia     Initial Endocrinology Clinic Visit: 05/30/2018    PATIENT IDENTIFIER: [name] is a 79 y.o., female with a past medical history of HTN, CHF, SVT, Osteopenia and left foot drop . She has followed with [facility] since 05/30/2018 for consultative assistance with management of her Hypercalcemia .   HISTORICAL SUMMARY: The patient was noted to have intermittent hypercalcemia since 09/2008. This has become more persistent in April, 2019. Pt also was noted to have an elevated PTH in 2013, 2014 and in 2018.   On her initial presentation she was asymptomatic She was taking 4000 IU vitamin D supplements, she was found to have hypervitaminosis D at 107.19 ng/  ML. Vitamin D was stopped.   She has been off HCTZ since 2016. Unable to perform 24 hr urine collection due to faulty techniques.   DEXA scan showed osteopenia in 2010  SUBJECTIVE:   Today (07/23/2019):  [name] is here for 3-month follow-up on hypercalcemia. Since her last visit she denies renal stones,  polydipsia or constipation, she has polyuria due to diuretic use.  She is off Vitamin D  She denies polyuria or poly dispsia Denies constipation Denies any renal stones since her last visit here.   She stays hydrates  She consumes 1-2 servings of calcium daily      ROS:  As per HPI.   HISTORY:  Past Medical History:  Past Medical History:  Diagnosis Date  . Allergic rhinitis   . Ankle fracture    Left, Mortise Widening  . Arthritis    "knuckles" (06/17/2012)  . 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 Breast cancer ([place])    "right" (06/17/2012)  . Cataracts, bilateral   . Dyspnea    with exertion  . Exertional dyspnea   . GERD (gastroesophageal reflux disease)   . Hypertension   . Iron deficiency anemia     "hematologist watches it" (06/17/2012)  . Obesity   . OSA treated with BiPAP 03/08/2016  . Osteoarthritis    severe right hip osteoarthritis-s/p hip replacement  . Osteopenia 11/26/2009  . Thyroid disorder    "sees endocrinologist yearly" (06/17/2012)   Past Surgical History:  Past Surgical History:  Procedure Laterality Date  . ANKLE FRACTURE SURGERY Right 01/01/14   Has pins. Procedure performed at [facility]   "bilaterlly" (06/17/2012)  . COLONOSCOPY W/ POLYPECTOMY    . EYE SURGERY  2011   cataract removal both eyes  . HIP CLOSED REDUCTION  07/26/2012   Procedure: CLOSED MANIPULATION HIP;  Surgeon: Nita Sells, MD;  [facility];  Service: Orthopedics;  Laterality: Right;  . LUMBAR LAMINECTOMY  10/26/2017  . LUMBAR LAMINECTOMY/DECOMPRESSION MICRODISCECTOMY Bilateral 10/25/2017   Procedure: LUMBAR 2-3, LUMBAR 3-4 DECOMPRESSION TIME REQUESTED 4.5 HOURS;  Surgeon: Phylliss Bob, MD;  Location: [facility];  Service: Orthopedics;  Laterality: Bilateral;  . MASTECTOMY  1993?   bilateral mastectomy  . ORIF ANKLE FRACTURE Left 07/30/2017   Procedure: LEFT OPEN REDUCTION INTERNAL FIXATION (ORIF) ANKLE FRACTURE WITH SYDESMOTIC FIXATION;  Surgeon: Dorna Leitz, MD;  Location: [facility];  Service: Orthopedics;  Laterality: Left;  . TONSILLECTOMY  1949  . TOTAL HIP ARTHROPLASTY  04/2006   right hip   . TOTAL HIP REVISION  06/17/2012   "right" (  06/17/2012)  . TOTAL HIP REVISION  06/17/2012   Procedure: TOTAL HIP REVISION;  Surgeon: Kerin Salen, MD;  Location: [facility];  Service: Orthopedics;  Laterality: Right;    Social History:  reports that she quit smoking about 45 years ago. Her smoking use included cigarettes. She has a 5.00 pack-year smoking history. She has never used smokeless tobacco. She reports current alcohol use. She reports that she does not use drugs. Family History:  Family History  Problem Relation Age of Onset  . Heart failure Father         deceased age 45  . Diabetes Father   . Alcohol abuse Father   . Breast cancer Mother        deceased at age 73 secondary to breast cancer  . Liver disease Sister        Liver failure--deceased  . Dementia Maternal Grandmother   . Colon cancer Neg Hx   . Esophageal cancer Neg Hx   . Rectal cancer Neg Hx   . Stomach cancer Neg Hx      HOME MEDICATIONS: Allergies as of 07/22/2019      Reactions   Amlodipine Swelling   Swelling   Sulfonamide Derivatives Swelling   REACTION: Swelling of the face; "eyes swelled shut"   Chocolate Other (See Comments)   Sinus problems      Medication List       Accurate as of July 22, 2019 11:59 PM. If you have any questions, ask your nurse or doctor.        Aspercreme Lidocaine 4 % Ptch Generic drug: Lidocaine Apply 1 patch topically daily as needed (pain).   bisoprolol 10 MG tablet Commonly known as: ZEBETA TAKE 2 TABLETS EVERY DAY   fexofenadine 180 MG tablet Commonly known as: ALLEGRA Take 180 mg by mouth daily as needed. For seasonal allergies   Fish Oil 1200 MG Caps Take by mouth.   FLUoxetine 10 MG capsule Commonly known as: PROZAC TAKE 1 CAPSULE BY MOUTH EVERY DAY   fluticasone 50 MCG/ACT nasal spray Commonly known as: FLONASE USE 1 SPRAY IN EACH NOSTRIL EVERY DAY   furosemide 20 MG tablet Commonly known as: LASIX TAKE 1 TABLET EVERY MORNING, 1 TABLET AFTER 3PM AND MAY TAKE 1 TABLET EVERY EVENING AS NEEDED FOR SWELLING.   gabapentin 100 MG capsule Commonly known as: NEURONTIN TAKE 1 CAPSULE BY MOUTH THREE TIMES A DAY   losartan 100 MG tablet Commonly known as: COZAAR TAKE 1 TABLET EVERY DAY   omeprazole 20 MG capsule Commonly known as: PRILOSEC TAKE 1 CAPSULE EVERY DAY   potassium chloride SA 20 MEQ tablet Commonly known as: KLOR-CON TAKE 1 TABLET EVERY DAY   simvastatin 10 MG tablet Commonly known as: ZOCOR TAKE 1 TABLET EVERY DAY   SYSTANE OP Apply 1 drop to eye daily as needed (dry eyes).           OBJECTIVE:   PHYSICAL EXAM: VS: BP (!) 152/82 (BP Location: Left Arm, Patient Position: Sitting, Cuff Size: Large)   Pulse 78   Temp 97.8 F (36.6 C)   Ht 5\' 6"  (1.676 m)   Wt 222 lb (100.7 kg)   LMP 07/11/1991   SpO2 98%   BMI 35.83 kg/m    EXAM: General: Pt appears well and is in NAD  Neck: General: Supple without adenopathy. Thyroid: Thyroid size normal.  No goiter or nodules appreciated. No thyroid bruit.  Lungs: Clear with good BS bilat with no rales, rhonchi,  or wheezes  Heart: Auscultation: RRR.  Extremities:  BL LE: Pretibial non-pitting edema present  Mental Status: Judgment, insight: Intact Orientation: Oriented to time, place, and person Mood and affect: No depression, anxiety, or agitation     DATA REVIEWED: Results for QUANNA, SCHOLLMEYER (MRN [id number]) as of 07/23/2019 15:26  Ref. Range 07/22/2019 11:30  Sodium Latest Ref Range: 135 - 145 mEq/L 136  Potassium Latest Ref Range: 3.5 - 5.1 mEq/L 4.7  Chloride Latest Ref Range: 96 - 112 mEq/L 103  CO2 Latest Ref Range: 19 - 32 mEq/L 25  Glucose Latest Ref Range: 70 - 99 mg/dL 106 (H)  BUN Latest Ref Range: 6 - 23 mg/dL 38 (H)  Creatinine Latest Ref Range: 0.40 - 1.20 mg/dL 1.51 (H)  Calcium Latest Ref Range: 8.4 - 10.5 mg/dL 10.4  Albumin Latest Ref Range: 3.5 - 5.2 g/dL 4.2  GFR Latest Ref Range: >60.00 mL/min 33.47 (L)  VITD Latest Ref Range: 30.00 - 100.00 ng/mL 65.63  PTH, Intact Latest Ref Range: 14 - 64 pg/mL 152 (H)         ASSESSMENT / PLAN / RECOMMENDATIONS:   1) Hypercalcemia :   - Calcium has normalized on today's labs, this could actually be due to normalization of vitamin D, as hers was very high at some point. There's definitely a parathyroid component , but also due to her CKD part of the PTH is secondary to that.   - Initially my differential included Primary Hyperparathyroidism (pHPT) vs Familial hypocalciuric hypercalcemia -We were not able to obtain a 24-hour urine  collection on her last visit, due to improper urine collection technique. Pt with urine incontinence and is unable to recollect urine. The highest calcium level in the urine was  -Repeat BMP today shows serum calcium at  10.4 mg/dL (corrected 10.24 mg/dL ) which is normal , her vitamin D levels have reduced to normal levels .    2.  Hypervitaminosis D:  - Normalized. Continue to stay off Vitamin D   3. Hyperparathyroidism:   - Now that her vitamin D has normalized, her PTH is higher, part of this is secondary to her CKD III, I would strongly recommend a referral to nephrology if she is not under the care of one yet. I will defer this to the discretion of her PCP.     Follow-up in 6 months     Signed electronically by: Mack Guise, MD  [facility] End[facility]in[facility]  [facility] [address] [place], [place] [address] Phone: [phone] FAX: [phone]      CC: Debbrah Alar, [address] [place] [address] Phone: [phone]  Fax: [phone]   Return to Endocrinology clinic as below: Future Appointments  Date Time Provider [place]  01/20/2020 11:10 AM Daymien Goth, Melanie Crazier, MD [facility] None  05/04/2020 11:00 AM Dennis Bast, RN [facility]

## 2019-07-22 ENCOUNTER — Other Ambulatory Visit: Payer: Self-pay

## 2019-07-22 ENCOUNTER — Encounter: Payer: Self-pay | Admitting: Internal Medicine

## 2019-07-22 ENCOUNTER — Ambulatory Visit
Admission: RE | Admit: 2019-07-22 | Discharge: 2019-07-22 | Disposition: A | Discharge status: Home / Self Care | Payer: Medicare Other | Source: Ambulatory Visit | Attending: Internal Medicine | Admitting: Internal Medicine

## 2019-07-22 ENCOUNTER — Ambulatory Visit (INDEPENDENT_AMBULATORY_CARE_PROVIDER_SITE_OTHER): Payer: Medicare Other | Admitting: Internal Medicine

## 2019-07-22 VITALS — BP 152/82 | HR 78 | Temp 97.8°F | Ht 66.0 in | Wt 222.0 lb

## 2019-07-22 DIAGNOSIS — N1832 Chronic kidney disease, stage 3b: Secondary | ICD-10-CM

## 2019-07-22 DIAGNOSIS — E673 Hypervitaminosis D: Secondary | ICD-10-CM | POA: Diagnosis not present

## 2019-07-22 DIAGNOSIS — E213 Hyperparathyroidism, unspecified: Secondary | ICD-10-CM

## 2019-07-22 LAB — BASIC METABOLIC PANEL
BUN: 38 mg/dL — ABNORMAL HIGH (ref 6–23)
CO2: 25 mEq/L (ref 19–32)
Calcium: 10.4 mg/dL (ref 8.4–10.5)
Chloride: 103 mEq/L (ref 96–112)
Creatinine, Ser: 1.51 mg/dL — ABNORMAL HIGH (ref 0.40–1.20)
GFR: 33.47 mL/min — ABNORMAL LOW (ref >60.00)
Glucose, Bld: 106 mg/dL — ABNORMAL HIGH (ref 70–99)
Potassium: 4.7 mEq/L (ref 3.5–5.1)
Sodium: 136 mEq/L (ref 135–145)

## 2019-07-22 LAB — VITAMIN D 25 HYDROXY (VIT D DEFICIENCY, FRACTURES): VITD: 65.63 ng/mL (ref 30.00–100.00)

## 2019-07-22 LAB — ALBUMIN: Albumin: 4.2 g/dL (ref 3.5–5.2)

## 2019-07-22 NOTE — Patient Instructions (Addendum)
-   Try to stay hydrated  - AVOID CALCIUM SUPPLEMENTS, AVOID LOW CALCIUM DIET  - Maintain normal dietary calcium intake (2-3 servings of dairy a day)  - FOODS THAT CONTAIN CALCIUM  Calcium can be found in many foods, not only in dairy products.  Dairy Foods  Yogurt (1 cup) 350 mg  Milk (1 cup) 300 mg  Cheddar cheese (1 oz.) 204 mg  Ricotta cheese, part skim (1/4 cup) 169 mg  Cottage cheese (1 cup) 150 mg  Nondairy Foods  Whole Grain Total cereal (3/4 cup) 1000 mg  Pink salmon with bones, sardines (3 oz., cooked) 181 mg  Black beans (1 cup) 103 mg  Broccoli (1 cup, cooked) 150 mg  Almonds (1 tbsp.) 50 mg  Soy Products  Soy yogurt with calcium (3/4 cup) 300 mg  Soy milk enriched with calcium (1 cup) 300 mg  Tofu, firm or extra firm (1/4 cup) 250 mg  Soy nuts, roasted/salted (1/2 cup) 103 mg

## 2019-07-23 LAB — PARATHYROID HORMONE, INTACT (NO CA): PTH: 152 pg/mL — ABNORMAL HIGH (ref 14–64)

## 2019-07-24 ENCOUNTER — Encounter: Payer: Self-pay | Admitting: Internal Medicine

## 2019-07-24 DIAGNOSIS — N1832 Chronic kidney disease, stage 3b: Secondary | ICD-10-CM | POA: Insufficient documentation

## 2019-08-22 ENCOUNTER — Other Ambulatory Visit: Payer: Self-pay | Admitting: Family

## 2019-08-24 ENCOUNTER — Other Ambulatory Visit: Payer: Self-pay | Admitting: Family

## 2019-08-25 NOTE — Telephone Encounter (Signed)
Please contact pt to schedule a lab visit for bmet, dx hypokalemia.  Needs face to face visit in June please.

## 2019-08-25 NOTE — Telephone Encounter (Signed)
Refilled zebeta and potassium. Pt last seen 06/2019 and has no future appts scheduled. When will pt be due for routine follow up?

## 2019-09-08 ENCOUNTER — Other Ambulatory Visit: Payer: Self-pay | Admitting: Family

## 2019-09-21 ENCOUNTER — Other Ambulatory Visit: Payer: Self-pay | Admitting: Family

## 2019-10-15 ENCOUNTER — Encounter: Payer: Self-pay | Admitting: General Practice

## 2019-11-05 DIAGNOSIS — Z961 Presence of intraocular lens: Secondary | ICD-10-CM | POA: Diagnosis not present

## 2019-11-05 DIAGNOSIS — H0100B Unspecified blepharitis left eye, upper and lower eyelids: Secondary | ICD-10-CM | POA: Diagnosis not present

## 2019-11-05 DIAGNOSIS — H43813 Vitreous degeneration, bilateral: Secondary | ICD-10-CM | POA: Diagnosis not present

## 2019-11-05 DIAGNOSIS — H0100A Unspecified blepharitis right eye, upper and lower eyelids: Secondary | ICD-10-CM | POA: Diagnosis not present

## 2019-11-06 ENCOUNTER — Other Ambulatory Visit: Payer: Self-pay | Admitting: Family

## 2019-11-08 ENCOUNTER — Other Ambulatory Visit: Payer: Self-pay | Admitting: Family

## 2019-11-24 ENCOUNTER — Other Ambulatory Visit: Payer: Self-pay | Admitting: Family

## 2019-11-25 ENCOUNTER — Telehealth: Payer: Self-pay | Admitting: Family

## 2019-11-25 NOTE — Telephone Encounter (Signed)
Please contact pt to schedule a follow up visit with me next month.

## 2019-11-25 NOTE — Telephone Encounter (Signed)
Called pt left msg on machine to call back for 1 month follow up

## 2019-12-20 ENCOUNTER — Other Ambulatory Visit: Payer: Self-pay | Admitting: Family

## 2020-01-19 ENCOUNTER — Other Ambulatory Visit: Payer: Self-pay | Admitting: Family

## 2020-01-20 ENCOUNTER — Ambulatory Visit: Payer: Medicare Other | Admitting: Internal Medicine

## 2020-01-22 ENCOUNTER — Other Ambulatory Visit: Payer: Self-pay | Admitting: Family

## 2020-02-03 ENCOUNTER — Ambulatory Visit (INDEPENDENT_AMBULATORY_CARE_PROVIDER_SITE_OTHER): Payer: Medicare Other | Admitting: Internal Medicine

## 2020-02-03 ENCOUNTER — Encounter: Payer: Self-pay | Admitting: Internal Medicine

## 2020-02-03 ENCOUNTER — Other Ambulatory Visit: Payer: Self-pay

## 2020-02-03 VITALS — BP 181/85 | HR 68 | Temp 97.0°F | Ht 67.0 in | Wt 232.6 lb

## 2020-02-03 DIAGNOSIS — Z79899 Other long term (current) drug therapy: Secondary | ICD-10-CM | POA: Diagnosis not present

## 2020-02-03 DIAGNOSIS — G4733 Obstructive sleep apnea (adult) (pediatric): Secondary | ICD-10-CM | POA: Diagnosis not present

## 2020-02-03 DIAGNOSIS — E66813 Obesity, class 3: Secondary | ICD-10-CM

## 2020-02-03 DIAGNOSIS — E669 Obesity, unspecified: Secondary | ICD-10-CM | POA: Diagnosis not present

## 2020-02-03 DIAGNOSIS — I10 Essential (primary) hypertension: Secondary | ICD-10-CM

## 2020-02-03 DIAGNOSIS — I509 Heart failure, unspecified: Secondary | ICD-10-CM

## 2020-02-03 MED ORDER — VALSARTAN 320 MG PO TABS: 320.0000 mg | ORAL_TABLET | Freq: Every day | ORAL | 3 refills | Status: DC

## 2020-02-03 NOTE — Addendum Note (Signed)
Addended by: Fidel Levy on: 02/03/2020 02:03 PM   Modules accepted: Orders

## 2020-02-03 NOTE — Patient Instructions (Signed)
Medication Instructions:  STOP losartan START valsartan 320mg  daily  *If you need a refill on your cardiac medications before your next appointment, please call your pharmacy*   Lab Work: BMET in 1 week   If you have labs (blood work) drawn today and your tests are completely normal, you will receive your results only by: Marland Kitchen MyChart Message (if you have MyChart) OR . A paper copy in the mail If you have any lab test that is abnormal or we need to change your treatment, we will call you to review the results.   Testing/Procedures: NONE   Follow-Up: At [facility], you and your health needs are our priority.  As part of our continuing mission to provide you with exceptional heart care, we have created designated Provider Care Teams.  These Care Teams include your primary Cardiologist (physician) and Advanced Practice Providers (APPs -  Physician Assistants and Nurse Practitioners) who all work together to provide you with the care you need, when you need it.  We recommend signing up for the patient portal called "MyChart".  Sign up information is provided on this After Visit Summary.  MyChart is used to connect with patients for Virtual Visits (Telemedicine).  Patients are able to view lab/test results, encounter notes, upcoming appointments, etc.  Non-urgent messages can be sent to your provider as well.   To learn more about what you can do with MyChart, go to [email or web address].    Your next appointment:   1-2 month(s)  The format for your next appointment:   In Person  Provider:   You may see Dr. Debara Pickett or one of the following Advanced Practice Providers on your designated Care Team:    Almyra Deforest, PA-C  Fabian Sharp, [place] or   Roby Lofts, [place]    Other Instructions

## 2020-02-03 NOTE — Progress Notes (Signed)
OFFICE NOTE  Chief Complaint:  No complaints  Primary Care Physician: Debbrah Alar, NP  HPI:  [name] is a 79 y.o. female who is currently referred to me for evaluation of palpitations. While [name] has noted some palpitations, she reports not being particularly bothered by them. Her past medical history significant for an episode of congestive heart failure back in January 2016. She presented with symptoms of anasarca including upper and lower extremity swelling. The time she was on amlodipine which was discontinued and she was started on Lasix. She had significant diuresis and an echocardiogram, however did showed normal systolic and reportedly diastolic function. Based on this findings is difficult to understand whether this was cardiac edema or some other cause of volume overload. Nonetheless, recently she's had some palpitations and was fitted with a monitor. The monitor demonstrated PACs, PVCs and an episode of SVT which was transient and spontaneously terminated. Does note that she's been having some more frequent episodes of palpitations but they are not significantly bothersome to her. She also reports some heaviness in her chest when she does activities, although she is very inactive due to her hip problem and walks with cane. She also has morbid obesity and hypertension. She does take Bystolic is one of her blood pressure medications. In addition I asked her screening questions for possible sleep apnea. She does report daytime fatigue, somnolence, poor sleep at night, witnessed snoring and occasional episodes of apnea. Her Epworth sleepiness score scale is greater than 10.  12/07/2015  [name] was seen today in follow-up of her recent stress test. This was negative for ischemia, was interpreted as low risk and showed an LVEF of 64%. She reports since her stress test her palpitations have improved quite a bit. She is on bisoprolol 10 mg twice a day. She has not  yet had her sleep study which is coming up on June 15. She does worry that she may not be able sleep well enough for that study. She typically goes to bed at 2 AM and sleeps till about 6 AM. She says she often naps during the day and this could be playing a role in her poor sleep at night. While she is tried to go to bed earlier she says she cannot fall asleep. She also reports that she's had about 5 pound weight gain over the past 2 days with notable increase in lower extremity swelling. She and her husband report increase salt intake with various foods over the holiday weekend.  03/08/2016  [name] returns back today for follow-up. She reported some improvement in her swelling with an increase in Lasix. In the interim she underwent a sleep study and was found to have significant sleep apnea requiring BiPAP. This is been fitted and she's been wearing it regularly and notes that she's had an improvement in her symptoms and energy level. Unfortunately she is grieving as she recently lost her husband to acute MI. He also had suffered with cancer for a number of years.  09/07/2016  [name] returns today for follow-up. She has managed to lose a few pounds since we last saw her. She reports her mood is improved, understandably after the death of her husband fairly recently. She reports good compliance with BiPAP recently saw Dr. Claiborne Billings who reports that she is doing well on her current settings. She has responded some increased dose Lasix for swelling and generally does well wearing her compression stockings. EKG today shows normal sinus rhythm.  She does report some occasional lower blood pressures in the morning which he takes all of her medications.  03/15/2017  [name] was seen today in follow-up. She reports increase in weight and lower extremity swelling. She's currently taking 20 mg twice daily. She's been compliant with her BiPAP and generally gets about 5 hours of sleep at night. She notes less  frequent palpitations. She denies any chest pain. Her blood pressure is now better controlled with the addition of clonidine.  02/03/2020  [name] seen today in follow-up.  I last saw her in September 2018.  Since then she has been seeing her primary care both in person and virtually.  She has actually lost some weight.  Weight is now 232 down from 248 pounds.  Unfortunately she had recent fall and injury to the hip as well as the ankles.  She still having difficulty walking related to that.  She denies any worsening shortness of breath.  Blood pressure has been running higher up to 180 today.  In general she says is around 989 systolic.  One point she was on clonidine but was taken off of due to side effects and actually became hypotensive.  PMHx:  Past Medical History:  Diagnosis Date  . Allergic rhinitis   . Ankle fracture    Left, Mortise Widening  . Arthritis    "knuckles" (06/17/2012)  . 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 Breast cancer ([place])    "right" (06/17/2012)  . Cataracts, bilateral   . Dyspnea    with exertion  . Exertional dyspnea   . GERD (gastroesophageal reflux disease)   . Hypertension   . Iron deficiency anemia    "hematologist watches it" (06/17/2012)  . Obesity   . OSA treated with BiPAP 03/08/2016  . Osteoarthritis    severe right hip osteoarthritis-s/p hip replacement  . Osteopenia 11/26/2009  . Thyroid disorder    "sees endocrinologist yearly" (06/17/2012)    Past Surgical History:  Procedure Laterality Date  . ANKLE FRACTURE SURGERY Right 01/01/14   Has pins. Procedure performed at [facility]   "bilaterlly" (06/17/2012)  . COLONOSCOPY W/ POLYPECTOMY    . EYE SURGERY  2011   cataract removal both eyes  . HIP CLOSED REDUCTION  07/26/2012   Procedure: CLOSED MANIPULATION HIP;  Surgeon: Nita Sells, MD;  Location: [facility];  Service: Orthopedics;  Laterality: Right;  . LUMBAR LAMINECTOMY  10/26/2017  . LUMBAR  LAMINECTOMY/DECOMPRESSION MICRODISCECTOMY Bilateral 10/25/2017   Procedure: LUMBAR 2-3, LUMBAR 3-4 DECOMPRESSION TIME REQUESTED 4.5 HOURS;  Surgeon: Phylliss Bob, MD;  Location: [facility];  Service: Orthopedics;  Laterality: Bilateral;  . MASTECTOMY  1993?   bilateral mastectomy  . ORIF ANKLE FRACTURE Left 07/30/2017   Procedure: LEFT OPEN REDUCTION INTERNAL FIXATION (ORIF) ANKLE FRACTURE WITH SYDESMOTIC FIXATION;  Surgeon: Dorna Leitz, MD;  Location: [facility];  Service: Orthopedics;  Laterality: Left;  . TONSILLECTOMY  1949  . TOTAL HIP ARTHROPLASTY  04/2006   right hip   . TOTAL HIP REVISION  06/17/2012   "right" (06/17/2012)  . TOTAL HIP REVISION  06/17/2012   Procedure: TOTAL HIP REVISION;  Surgeon: Kerin Salen, MD;  Location: [facility];  Service: Orthopedics;  Laterality: Right;    FAMHx:  Family History  Problem Relation Age of Onset  . Heart failure Father        deceased age 6  . Diabetes Father   . Alcohol abuse Father   .  Breast cancer Mother        deceased at age 53 secondary to breast cancer  . Liver disease Sister        Liver failure--deceased  . Dementia Maternal Grandmother   . Colon cancer Neg Hx   . Esophageal cancer Neg Hx   . Rectal cancer Neg Hx   . Stomach cancer Neg Hx     SOCHx:   reports that she quit smoking about 45 years ago. Her smoking use included cigarettes. She has a 5.00 pack-year smoking history. She has never used smokeless tobacco. She reports current alcohol use. She reports that she does not use drugs.  ALLERGIES:  Allergies  Allergen Reactions  . Amlodipine Swelling    Swelling   . Sulfonamide Derivatives Swelling    REACTION: Swelling of the face; "eyes swelled shut"  . Chocolate Other (See Comments)    Sinus problems    ROS: Pertinent items noted in HPI and remainder of comprehensive ROS otherwise negative.  HOME MEDS: Current Outpatient Medications  Medication Sig Dispense Refill  . bisoprolol (ZEBETA) 10 MG tablet Take 2 tablets  (20 mg total) by mouth daily. 180 tablet 0  . fexofenadine (ALLEGRA) 180 MG tablet Take 180 mg by mouth daily as needed. For seasonal allergies    . FLUoxetine (PROZAC) 10 MG capsule TAKE 1 CAPSULE BY MOUTH EVERY DAY 90 capsule 0  . fluticasone (FLONASE) 50 MCG/ACT nasal spray USE 1 SPRAY IN EACH NOSTRIL EVERY DAY 32 g 1  . furosemide (LASIX) 20 MG tablet TAKE 1 TABLET EVERY MORNING, TAKE 1 TABLET AFTER 3PM AND MAY TAKE 1 TABLET EVERY EVENING AS NEEDED FOR SWELLING. 270 tablet 0  . gabapentin (NEURONTIN) 100 MG capsule TAKE 1 CAPSULE BY MOUTH THREE TIMES A DAY 270 capsule 2  . Lidocaine (ASPERCREME LIDOCAINE) 4 % PTCH Apply 1 patch topically daily as needed (pain).    Marland Kitchen losartan (COZAAR) 100 MG tablet TAKE 1 TABLET EVERY DAY 90 tablet 0  . Omega-3 Fatty Acids (FISH OIL) 1200 MG CAPS Take by mouth.    Vladimir Faster Glycol-Propyl Glycol (SYSTANE OP) Apply 1 drop to eye daily as needed (dry eyes).    . potassium chloride SA (KLOR-CON) 20 MEQ tablet Take 1 tablet (20 mEq total) by mouth daily. 90 tablet 0  . simvastatin (ZOCOR) 10 MG tablet TAKE 1 TABLET EVERY DAY 90 tablet 0   No current facility-administered medications for this visit.    LABS/IMAGING: No results found for this or any previous visit (from the past 48 hour(s)). No results found.  WEIGHTS: Wt Readings from Last 3 Encounters:  02/03/20 (!) 232 lb 9.6 oz (105.5 kg)  07/22/19 222 lb (100.7 kg)  05/01/19 195 lb (88.5 kg)    VITALS: BP (!) 181/85   Pulse 68   Temp (!) 97 F (36.1 C)   Ht 5\' 7"  (1.702 m)   Wt (!) 232 lb 9.6 oz (105.5 kg)   LMP 07/11/1991   SpO2 97%   BMI 36.43 kg/m   EXAM: General appearance: alert, no distress and morbidly obese Neck: no carotid bruit, no JVD and thyroid not enlarged, symmetric, no tenderness/mass/nodules Lungs: clear to auscultation bilaterally Heart: regular rate and rhythm Abdomen: soft, non-tender; bowel sounds normal; no masses,  no organomegaly Extremities: edema 1+ pedal  edema - bilateral compression stockings simply Pulses: 2+ and symmetric Skin: Pale, warm, dry Neurologic: Mental status: Alert, oriented, thought content appropriate Psych: Pleasant  EKG: Normal sinus rhythm 68-personally reviewed  ASSESSMENT: 1. Chronic diastolic CHF 2. Palpitations-PSVT, PACs, PVCs 3. Chest pressure - low risk Myoview stress test, EF 64% (2017) 4. Dyspnea on exertion 5. Essential hypertension 6. Morbid obesity 7. Family history of coronary disease 8. OSA on BIPAP 9. CKD2  PLAN:  1.   [name] denies any new or worsening shortness of breath.  She has history of some diastolic CHF and takes sliding scale Lasix.  Her creatinine has been elevated and was 1.5 recently.  Blood pressure does not appear to be optimally controlled.  I think she would benefit from a newer generation ARB medication with slightly stronger efficacy than the losartan.  Would recommend switching to valsartan 320 mg daily.  I will repeat a metabolic profile to see if her creatinine remains stable.  She is not currently followed by a nephrologist.  Also follow-up for blood pressure check in 1 to 2 months.  Pixie Casino, MD, [facility], [place] Director of the Advanced Lipid Disorders &  Cardiovascular Risk Reduction Clinic Diplomate of the American Board of Clinical Lipidology Attending Cardiologist  Direct Dial: [phone]  Fax: [phone]  Website:  [email or web address]  Nadean Corwin Josie Burleigh 02/03/2020, 1:40 PM

## 2020-02-06 ENCOUNTER — Other Ambulatory Visit: Payer: Self-pay | Admitting: Family

## 2020-02-11 DIAGNOSIS — Z79899 Other long term (current) drug therapy: Secondary | ICD-10-CM | POA: Diagnosis not present

## 2020-02-12 LAB — BASIC METABOLIC PANEL
BUN/Creatinine Ratio: 19 (ref 12–28)
BUN: 27 mg/dL (ref 8–27)
CO2: 21 mmol/L (ref 20–29)
Calcium: 10.3 mg/dL (ref 8.7–10.3)
Chloride: 103 mmol/L (ref 96–106)
Creatinine, Ser: 1.44 mg/dL — ABNORMAL HIGH (ref 0.57–1.00)
GFR calc Af Amer: 41 mL/min/{1.73_m2} — ABNORMAL LOW (ref >59)
GFR calc non Af Amer: 35 mL/min/{1.73_m2} — ABNORMAL LOW (ref >59)
Glucose: 106 mg/dL — ABNORMAL HIGH (ref 65–99)
Potassium: 5 mmol/L (ref 3.5–5.2)
Sodium: 139 mmol/L (ref 134–144)

## 2020-03-14 ENCOUNTER — Other Ambulatory Visit: Payer: Self-pay | Admitting: Family

## 2020-03-20 ENCOUNTER — Encounter: Payer: Self-pay | Admitting: Family

## 2020-03-22 ENCOUNTER — Other Ambulatory Visit: Payer: Self-pay | Admitting: Family

## 2020-03-24 DIAGNOSIS — Z23 Encounter for immunization: Secondary | ICD-10-CM | POA: Diagnosis not present

## 2020-04-02 ENCOUNTER — Other Ambulatory Visit: Payer: Self-pay | Admitting: Family

## 2020-04-05 ENCOUNTER — Other Ambulatory Visit: Payer: Self-pay

## 2020-04-05 ENCOUNTER — Ambulatory Visit (INDEPENDENT_AMBULATORY_CARE_PROVIDER_SITE_OTHER): Payer: Medicare Other | Admitting: Internal Medicine

## 2020-04-05 ENCOUNTER — Encounter: Payer: Self-pay | Admitting: Internal Medicine

## 2020-04-05 VITALS — BP 144/90 | HR 69 | Ht 66.0 in | Wt 236.8 lb

## 2020-04-05 DIAGNOSIS — I10 Essential (primary) hypertension: Secondary | ICD-10-CM | POA: Diagnosis not present

## 2020-04-05 DIAGNOSIS — I509 Heart failure, unspecified: Secondary | ICD-10-CM | POA: Diagnosis not present

## 2020-04-05 DIAGNOSIS — G4733 Obstructive sleep apnea (adult) (pediatric): Secondary | ICD-10-CM | POA: Diagnosis not present

## 2020-04-05 DIAGNOSIS — E669 Obesity, unspecified: Secondary | ICD-10-CM

## 2020-04-05 MED ORDER — BISOPROLOL FUMARATE 10 MG PO TABS: 20.0000 mg | ORAL_TABLET | Freq: Every day | ORAL | 3 refills | Status: DC

## 2020-04-05 MED ORDER — POTASSIUM CHLORIDE CRYS ER 20 MEQ PO TBCR: 20.0000 meq | EXTENDED_RELEASE_TABLET | Freq: Every day | ORAL | 3 refills | Status: DC

## 2020-04-05 NOTE — Progress Notes (Signed)
OFFICE NOTE  Chief Complaint:  No complaints  Primary Care Physician: Debbrah Alar, NP  HPI:  [name] is a 78 y.o. female who is currently referred to me for evaluation of palpitations. While [name] has noted some palpitations, she reports not being particularly bothered by them. Her past medical history significant for an episode of congestive heart failure back in January 2016. She presented with symptoms of anasarca including upper and lower extremity swelling. The time she was on amlodipine which was discontinued and she was started on Lasix. She had significant diuresis and an echocardiogram, however did showed normal systolic and reportedly diastolic function. Based on this findings is difficult to understand whether this was cardiac edema or some other cause of volume overload. Nonetheless, recently she's had some palpitations and was fitted with a monitor. The monitor demonstrated PACs, PVCs and an episode of SVT which was transient and spontaneously terminated. Does note that she's been having some more frequent episodes of palpitations but they are not significantly bothersome to her. She also reports some heaviness in her chest when she does activities, although she is very inactive due to her hip problem and walks with cane. She also has morbid obesity and hypertension. She does take Bystolic is one of her blood pressure medications. In addition I asked her screening questions for possible sleep apnea. She does report daytime fatigue, somnolence, poor sleep at night, witnessed snoring and occasional episodes of apnea. Her Epworth sleepiness score scale is greater than 10.  12/07/2015  [name] was seen today in follow-up of her recent stress test. This was negative for ischemia, was interpreted as low risk and showed an LVEF of 64%. She reports since her stress test her palpitations have improved quite a bit. She is on bisoprolol 10 mg twice a day. She has not  yet had her sleep study which is coming up on June 15. She does worry that she may not be able sleep well enough for that study. She typically goes to bed at 2 AM and sleeps till about 6 AM. She says she often naps during the day and this could be playing a role in her poor sleep at night. While she is tried to go to bed earlier she says she cannot fall asleep. She also reports that she's had about 5 pound weight gain over the past 2 days with notable increase in lower extremity swelling. She and her husband report increase salt intake with various foods over the holiday weekend.  03/08/2016  [name] returns back today for follow-up. She reported some improvement in her swelling with an increase in Lasix. In the interim she underwent a sleep study and was found to have significant sleep apnea requiring BiPAP. This is been fitted and she's been wearing it regularly and notes that she's had an improvement in her symptoms and energy level. Unfortunately she is grieving as she recently lost her husband to acute MI. He also had suffered with cancer for a number of years.  09/07/2016  [name] returns today for follow-up. She has managed to lose a few pounds since we last saw her. She reports her mood is improved, understandably after the death of her husband fairly recently. She reports good compliance with BiPAP recently saw Dr. Claiborne Billings who reports that she is doing well on her current settings. She has responded some increased dose Lasix for swelling and generally does well wearing her compression stockings. EKG today shows normal sinus rhythm.  She does report some occasional lower blood pressures in the morning which he takes all of her medications.  03/15/2017  [name] was seen today in follow-up. She reports increase in weight and lower extremity swelling. She's currently taking 20 mg twice daily. She's been compliant with her BiPAP and generally gets about 5 hours of sleep at night. She notes less  frequent palpitations. She denies any chest pain. Her blood pressure is now better controlled with the addition of clonidine.  02/03/2020  [name] seen today in follow-up.  I last saw her in September 2018.  Since then she has been seeing her primary care both in person and virtually.  She has actually lost some weight.  Weight is now 232 down from 248 pounds.  Unfortunately she had recent fall and injury to the hip as well as the ankles.  She still having difficulty walking related to that.  She denies any worsening shortness of breath.  Blood pressure has been running higher up to 180 today.  In general she says is around 191 systolic.  One point she was on clonidine but was taken off of due to side effects and actually became hypotensive.  04/05/2020  [name] returns today for follow-up.  I had switched up her ARB to valsartan 320 mg daily.  She seems to be tolerating this with some improvement in blood pressures now around 140/90.  She says at home her diastolics are actually much lower in the mid 60s.  Renal function is stable if not slightly improved by bmet.  She continues to work to lose weight.  PMHx:  Past Medical History:  Diagnosis Date  . Allergic rhinitis   . Ankle fracture    Left, Mortise Widening  . Arthritis    "knuckles" (06/17/2012)  . 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 Breast cancer ([place])    "right" (06/17/2012)  . Cataracts, bilateral   . Dyspnea    with exertion  . Exertional dyspnea   . GERD (gastroesophageal reflux disease)   . Hypertension   . Iron deficiency anemia    "hematologist watches it" (06/17/2012)  . Obesity   . OSA treated with BiPAP 03/08/2016  . Osteoarthritis    severe right hip osteoarthritis-s/p hip replacement  . Osteopenia 11/26/2009  . Thyroid disorder    "sees endocrinologist yearly" (06/17/2012)    Past Surgical History:  Procedure Laterality Date  . ANKLE FRACTURE SURGERY Right 01/01/14   Has pins. Procedure performed at  [facility]   "bilaterlly" (06/17/2012)  . COLONOSCOPY W/ POLYPECTOMY    . EYE SURGERY  2011   cataract removal both eyes  . HIP CLOSED REDUCTION  07/26/2012   Procedure: CLOSED MANIPULATION HIP;  Surgeon: Nita Sells, MD;  [facility];  Service: Orthopedics;  Laterality: Right;  . LUMBAR LAMINECTOMY  10/26/2017  . LUMBAR LAMINECTOMY/DECOMPRESSION MICRODISCECTOMY Bilateral 10/25/2017   Procedure: LUMBAR 2-3, LUMBAR 3-4 DECOMPRESSION TIME REQUESTED 4.5 HOURS;  Surgeon: Phylliss Bob, MD;  Location: [facility];  Service: Orthopedics;  Laterality: Bilateral;  . MASTECTOMY  1993?   bilateral mastectomy  . ORIF ANKLE FRACTURE Left 07/30/2017   Procedure: LEFT OPEN REDUCTION INTERNAL FIXATION (ORIF) ANKLE FRACTURE WITH SYDESMOTIC FIXATION;  Surgeon: Dorna Leitz, MD;  Location: [facility];  Service: Orthopedics;  Laterality: Left;  . TONSILLECTOMY  1949  . TOTAL HIP ARTHROPLASTY  04/2006   right hip   . TOTAL HIP REVISION  06/17/2012   "right" (  06/17/2012)  . TOTAL HIP REVISION  06/17/2012   Procedure: TOTAL HIP REVISION;  Surgeon: Kerin Salen, MD;  Location: [facility];  Service: Orthopedics;  Laterality: Right;    FAMHx:  Family History  Problem Relation Age of Onset  . Heart failure Father        deceased age 21  . Diabetes Father   . Alcohol abuse Father   . Breast cancer Mother        deceased at age 48 secondary to breast cancer  . Liver disease Sister        Liver failure--deceased  . Dementia Maternal Grandmother   . Colon cancer Neg Hx   . Esophageal cancer Neg Hx   . Rectal cancer Neg Hx   . Stomach cancer Neg Hx     SOCHx:   reports that she quit smoking about 45 years ago. Her smoking use included cigarettes. She has a 5.00 pack-year smoking history. She has never used smokeless tobacco. She reports current alcohol use. She reports that she does not use drugs.  ALLERGIES:  Allergies  Allergen Reactions  . Amlodipine Swelling     Swelling   . Sulfonamide Derivatives Swelling    REACTION: Swelling of the face; "eyes swelled shut"  . Chocolate Other (See Comments)    Sinus problems    ROS: Pertinent items noted in HPI and remainder of comprehensive ROS otherwise negative.  HOME MEDS: Current Outpatient Medications  Medication Sig Dispense Refill  . bisoprolol (ZEBETA) 10 MG tablet TAKE 2 TABLETS EVERY DAY (NEED APPOINTMENT AND LABS FOR  REFILLS) 180 tablet 0  . fexofenadine (ALLEGRA) 180 MG tablet Take 180 mg by mouth daily as needed. For seasonal allergies    . fluticasone (FLONASE) 50 MCG/ACT nasal spray USE 1 SPRAY IN EACH NOSTRIL EVERY DAY 32 g 1  . furosemide (LASIX) 20 MG tablet TAKE 1 TABLET EVERY MORNING, TAKE 1 TABLET AFTER 3PM AND MAY TAKE 1 TABLET EVERY EVENING AS NEEDED FOR SWELLING. 270 tablet 0  . gabapentin (NEURONTIN) 100 MG capsule TAKE 1 CAPSULE BY MOUTH THREE TIMES A DAY 270 capsule 2  . Lidocaine (ASPERCREME LIDOCAINE) 4 % PTCH Apply 1 patch topically daily as needed (pain).    . Omega-3 Fatty Acids (FISH OIL) 1200 MG CAPS Take by mouth.    Vladimir Faster Glycol-Propyl Glycol (SYSTANE OP) Apply 1 drop to eye daily as needed (dry eyes).    . potassium chloride SA (KLOR-CON) 20 MEQ tablet TAKE 1 TABLET EVERY DAY (NEED APPOINTMENT AND LABS FOR  REFILLS) 90 tablet 0  . simvastatin (ZOCOR) 10 MG tablet TAKE 1 TABLET EVERY DAY 90 tablet 0  . valsartan (DIOVAN) 320 MG tablet Take 1 tablet (320 mg total) by mouth daily. 90 tablet 3   No current facility-administered medications for this visit.    LABS/IMAGING: No results found for this or any previous visit (from the past 48 hour(s)). No results found.  WEIGHTS: Wt Readings from Last 3 Encounters:  04/05/20 236 lb 12.8 oz (107.4 kg)  02/03/20 (!) 232 lb 9.6 oz (105.5 kg)  07/22/19 222 lb (100.7 kg)    VITALS: BP (!) 144/90   Pulse 69   Ht 5\' 6"  (1.676 m)   Wt 236 lb 12.8 oz (107.4 kg)   LMP 07/11/1991   SpO2 99%   BMI 38.22 kg/m    EXAM: Deferred  EKG: Deferred  ASSESSMENT: 1. Chronic diastolic CHF 2. Palpitations-PSVT, PACs, PVCs 3. Chest pressure - low risk  Myoview stress test, EF 64% (2017) 4. Dyspnea on exertion 5. Essential hypertension 6. Morbid obesity 7. Family history of coronary disease 8. OSA on BIPAP 9. CKD2  PLAN:  1.   Mrs Thai has had some blood pressure improvement on high-dose ARB and her creatinine is slightly lower than it had been recently.  Continue to encourage weight loss and more physical activity.  Plan follow-up with me annually or sooner as necessary.Pixie Casino, MD, [facility], [place] Director of the Advanced Lipid Disorders &  Cardiovascular Risk Reduction Clinic Diplomate of the American Board of Clinical Lipidology Attending Cardiologist  Direct Dial: [phone]  Fax: [phone]  Website:  [email or web address] 04/05/2020, 3:31 PM

## 2020-04-05 NOTE — Patient Instructions (Signed)
Medication Instructions:  Your physician recommends that you continue on your current medications as directed. Please refer to the Current Medication list given to you today.  *If you need a refill on your cardiac medications before your next appointment, please call your pharmacy*    Follow-Up: At [facility], you and your health needs are our priority.  As part of our continuing mission to provide you with exceptional heart care, we have created designated Provider Care Teams.  These Care Teams include your primary Cardiologist (physician) and Advanced Practice Providers (APPs -  Physician Assistants and Nurse Practitioners) who all work together to provide you with the care you need, when you need it.  We recommend signing up for the patient portal called "MyChart".  Sign up information is provided on this After Visit Summary.  MyChart is used to connect with patients for Virtual Visits (Telemedicine).  Patients are able to view lab/test results, encounter notes, upcoming appointments, etc.  Non-urgent messages can be sent to your provider as well.   To learn more about what you can do with MyChart, go to [email or web address].    Your next appointment:   12 month(s)  The format for your next appointment:   In Person  Provider:   You may see Pixie Casino, MD or one of the following Advanced Practice Providers on your designated Care Team:    Almyra Deforest, PA-C  Fabian Sharp, [place] or   Roby Lofts, [place]    Other Instructions Please call our office 2 months in advance to schedule your follow-up appointment with Dr. Debara Pickett.

## 2020-04-07 ENCOUNTER — Encounter: Payer: Self-pay | Admitting: Family

## 2020-04-07 ENCOUNTER — Other Ambulatory Visit: Payer: Self-pay | Admitting: Family

## 2020-04-23 ENCOUNTER — Ambulatory Visit: Payer: Medicare Other | Admitting: Family

## 2020-05-04 ENCOUNTER — Other Ambulatory Visit: Payer: Self-pay

## 2020-05-04 ENCOUNTER — Ambulatory Visit: Payer: Self-pay | Admitting: *Deleted

## 2020-05-04 ENCOUNTER — Ambulatory Visit (INDEPENDENT_AMBULATORY_CARE_PROVIDER_SITE_OTHER): Payer: Medicare Other | Admitting: Family

## 2020-05-04 ENCOUNTER — Telehealth: Payer: Self-pay | Admitting: Family

## 2020-05-04 VITALS — BP 160/64 | HR 72 | Temp 98.7°F | Resp 16 | Ht 66.0 in | Wt 237.0 lb

## 2020-05-04 DIAGNOSIS — Z0000 Encounter for general adult medical examination without abnormal findings: Secondary | ICD-10-CM | POA: Diagnosis not present

## 2020-05-04 DIAGNOSIS — E785 Hyperlipidemia, unspecified: Secondary | ICD-10-CM | POA: Diagnosis not present

## 2020-05-04 DIAGNOSIS — R2681 Unsteadiness on feet: Secondary | ICD-10-CM

## 2020-05-04 DIAGNOSIS — I10 Essential (primary) hypertension: Secondary | ICD-10-CM

## 2020-05-04 DIAGNOSIS — E210 Primary hyperparathyroidism: Secondary | ICD-10-CM

## 2020-05-04 DIAGNOSIS — K219 Gastro-esophageal reflux disease without esophagitis: Secondary | ICD-10-CM

## 2020-05-04 DIAGNOSIS — R7303 Prediabetes: Secondary | ICD-10-CM | POA: Diagnosis not present

## 2020-05-04 DIAGNOSIS — D7589 Other specified diseases of blood and blood-forming organs: Secondary | ICD-10-CM | POA: Diagnosis not present

## 2020-05-04 DIAGNOSIS — D531 Other megaloblastic anemias, not elsewhere classified: Secondary | ICD-10-CM

## 2020-05-04 DIAGNOSIS — D649 Anemia, unspecified: Secondary | ICD-10-CM | POA: Diagnosis not present

## 2020-05-04 DIAGNOSIS — F339 Major depressive disorder, recurrent, unspecified: Secondary | ICD-10-CM | POA: Diagnosis not present

## 2020-05-04 DIAGNOSIS — M8580 Other specified disorders of bone density and structure, unspecified site: Secondary | ICD-10-CM | POA: Diagnosis not present

## 2020-05-04 MED ORDER — MIRABEGRON ER 25 MG PO TB24: 25.0000 mg | ORAL_TABLET | Freq: Every day | ORAL | 3 refills | Status: DC

## 2020-05-04 MED ORDER — FLUOXETINE HCL 10 MG PO CAPS: 10.0000 mg | ORAL_CAPSULE | Freq: Every day | ORAL | 1 refills | Status: DC

## 2020-05-04 MED ORDER — HYDRALAZINE HCL 25 MG PO TABS: 25.0000 mg | ORAL_TABLET | Freq: Two times a day (BID) | ORAL | 1 refills | Status: DC

## 2020-05-04 MED ORDER — FAMOTIDINE 20 MG PO TABS: 20.0000 mg | ORAL_TABLET | Freq: Two times a day (BID) | ORAL | 1 refills | Status: DC

## 2020-05-04 NOTE — Patient Instructions (Addendum)
Start pepcid twice daily for heart burn.  Start myrbetriq once daily for your bladder.  Restart prozac.   You should be contacted about scheduling your appointment with physical therapy.  Please ask your pharmacist about the Shingrix (shingles vaccine).

## 2020-05-04 NOTE — Telephone Encounter (Signed)
Please advise pt that I gave some more thought to her blood pressure and I would like for her to add hydralazine 25mg  twice daily.  Let's bring her back in 1 month rather than 3 months for follow up please.

## 2020-05-04 NOTE — Progress Notes (Signed)
Subjective:    [name] is a 79 y.o. female who presents for Medicare Annual/Subsequent preventive examination.  Preventive Screening-Counseling & Management  Immunizations:  Tetanus due,  Pneumovax and prevnar up to date. Reports that she has had 3 doses of pfizer. Had zostavax Diet:  Trying to work on her diet. Reports that she does cook "somewhat" Wt Readings from Last 3 Encounters:  05/04/20 237 lb (107.5 kg)  04/05/20 236 lb 12.8 oz (107.4 kg)  02/03/20 (!) 232 lb 9.6 oz (105.5 kg)  Exercise: does some exercises at home from PT.  Has a fear of falling.   Colonoscopy: 2017  Dexa: 2018  Pap Smear: n/a Mammogram: s/p bilateral mastectomy Vision: up to date Dental: up to date  HTN- on valsartan 320 mg, bisoprolol 10 mg.   GERD- stopped PPI due to risk of renal issues.  Still having gerd.   Hyperlipidemia- continues simvatatin 10mg  once daily. Lab Results  Component Value Date   CHOL 156 12/25/2018   HDL 68.80 12/25/2018   LDLCALC 73 12/25/2018   LDLDIRECT 110.4 12/13/2006   TRIG 71.0 12/25/2018   CHOLHDL 2 12/25/2018   Tobacco Social History   Tobacco Use  Smoking Status Former Smoker  . Packs/day: 0.50  . Years: 10.00  . Pack years: 5.00  . Types: Cigarettes  . Quit date: 07/14/1974  . Years since quitting: 45.8  Smokeless Tobacco Never Used  Tobacco Comment   quit in 1976 smoked for approx. 10 years     Problems Prior to Visit 1. CKD-  Lab Results  Component Value Date   CREATININE 1.44 (H) 02/11/2020   2. Depression- stopped prozac a few weeks ago on her own. Didn't feel like she needed it.   HTN- maintained on bisoprolol 10mg , diovan 320mg .    Hyperlipidemia- maintained on simvastatin 10mg .  Lab Results  Component Value Date   CHOL 156 12/25/2018   HDL 68.80 12/25/2018   LDLCALC 73 12/25/2018   LDLDIRECT 110.4 12/13/2006   TRIG 71.0 12/25/2018   CHOLHDL 2 12/25/2018   C/o urinary incontinence- especially after use of lasis.   Current  Problems (verified) Patient Active Problem List   Diagnosis Date Noted  . Stage 3b chronic kidney disease ([place]) 07/24/2019  . Hypervitaminosis D 09/18/2018  . Traumatic closed displaced fracture of lateral malleolus of left fibula, initial encounter 07/30/2017  . Low back pain 07/30/2017  . Displaced fracture of distal end of left fibula 07/30/2017  . Acute on chronic diastolic heart failure ([place]) 03/15/2017  . Diastolic heart failure ([place]) 03/08/2016  . OSA (obstructive sleep apnea) 03/08/2016  . Palpitations 10/29/2015  . Snoring 10/29/2015  . Excessive daytime sleepiness 10/29/2015  . PVC's (premature ventricular contractions) 10/29/2015  . SVT (supraventricular tachycardia) ([place]) 10/29/2015  . Onychomycosis 09/23/2015  . Leukocytosis 12/16/2014  . Osteoarthritis of both hands 08/29/2014  . CHF (congestive heart failure) ([place]) 07/14/2014  . Adjustment disorder 06/28/2014  . GERD (gastroesophageal reflux disease) 03/25/2014  . Elevated lipase 06/26/2013  . Cough 05/28/2013  . Morbid obesity due to excess calories ([place]) 03/05/2013  . Dislocation of hip prosthesis ([place]) 07/26/2012  . 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 Hyperglycemia 12/28/2011  . Abdominal pain 12/06/2011  . Hyperparathyroidism ([place]) 10/10/2011  . Overactive bladder 09/06/2011  . Hip pain, right 06/05/2011  . 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 HYPERCALCEMIA 11/29/2009  . Osteopenia 11/26/2009  . ALLERGIC RHINITIS 09/24/2008  . ADENOCARCINOMA, BREAST, HX OF 09/24/2008  . Hyperlipemia 01/24/2008  .  ANEMIA 01/24/2008  . Essential hypertension 12/13/2007    Medications Prior to Visit Current Outpatient Medications on File Prior to Visit  Medication Sig Dispense Refill  . bisoprolol (ZEBETA) 10 MG tablet Take 2 tablets (20 mg total) by mouth daily. 180 tablet 3  . fexofenadine (ALLEGRA) 180 MG tablet Take 180 mg by mouth daily as needed. For seasonal allergies    . fluticasone  (FLONASE) 50 MCG/ACT nasal spray USE 1 SPRAY IN EACH NOSTRIL EVERY DAY 32 g 1  . furosemide (LASIX) 20 MG tablet TAKE 1 TABLET EVERY MORNING, TAKE 1 TABLET AFTER 3PM AND MAY TAKE 1 TABLET EVERY EVENING AS NEEDED FOR SWELLING. 270 tablet 0  . gabapentin (NEURONTIN) 100 MG capsule TAKE 1 CAPSULE BY MOUTH THREE TIMES A DAY 270 capsule 2  . Lidocaine (ASPERCREME LIDOCAINE) 4 % PTCH Apply 1 patch topically daily as needed (pain).    . Omega-3 Fatty Acids (FISH OIL) 1200 MG CAPS Take by mouth.    Vladimir Faster Glycol-Propyl Glycol (SYSTANE OP) Apply 1 drop to eye daily as needed (dry eyes).    . potassium chloride SA (KLOR-CON) 20 MEQ tablet Take 1 tablet (20 mEq total) by mouth daily. 90 tablet 3  . simvastatin (ZOCOR) 10 MG tablet Take 1 tablet (10 mg total) by mouth daily. 90 tablet 0  . valsartan (DIOVAN) 320 MG tablet Take 1 tablet (320 mg total) by mouth daily. 90 tablet 3   No current facility-administered medications on file prior to visit.    Current Medications (verified) Current Outpatient Medications  Medication Sig Dispense Refill  . bisoprolol (ZEBETA) 10 MG tablet Take 2 tablets (20 mg total) by mouth daily. 180 tablet 3  . famotidine (PEPCID) 20 MG tablet Take 1 tablet (20 mg total) by mouth 2 (two) times daily. 180 tablet 1  . fexofenadine (ALLEGRA) 180 MG tablet Take 180 mg by mouth daily as needed. For seasonal allergies    . FLUoxetine (PROZAC) 10 MG capsule Take 1 capsule (10 mg total) by mouth daily. 90 capsule 1  . fluticasone (FLONASE) 50 MCG/ACT nasal spray USE 1 SPRAY IN EACH NOSTRIL EVERY DAY 32 g 1  . furosemide (LASIX) 20 MG tablet TAKE 1 TABLET EVERY MORNING, TAKE 1 TABLET AFTER 3PM AND MAY TAKE 1 TABLET EVERY EVENING AS NEEDED FOR SWELLING. 270 tablet 0  . gabapentin (NEURONTIN) 100 MG capsule TAKE 1 CAPSULE BY MOUTH THREE TIMES A DAY 270 capsule 2  . Lidocaine (ASPERCREME LIDOCAINE) 4 % PTCH Apply 1 patch topically daily as needed (pain).    . mirabegron ER (MYRBETRIQ)  25 MG TB24 tablet Take 1 tablet (25 mg total) by mouth daily. 30 tablet 3  . Omega-3 Fatty Acids (FISH OIL) 1200 MG CAPS Take by mouth.    Vladimir Faster Glycol-Propyl Glycol (SYSTANE OP) Apply 1 drop to eye daily as needed (dry eyes).    . potassium chloride SA (KLOR-CON) 20 MEQ tablet Take 1 tablet (20 mEq total) by mouth daily. 90 tablet 3  . simvastatin (ZOCOR) 10 MG tablet Take 1 tablet (10 mg total) by mouth daily. 90 tablet 0  . valsartan (DIOVAN) 320 MG tablet Take 1 tablet (320 mg total) by mouth daily. 90 tablet 3   No current facility-administered medications for this visit.     Allergies (verified) Amlodipine, Sulfonamide derivatives, and Chocolate   PAST HISTORY  Family History Family History  Problem Relation Age of Onset  . Heart failure Father  deceased age 44  . Diabetes Father   . Alcohol abuse Father   . Breast cancer Mother        deceased at age 20 secondary to breast cancer  . Liver disease Sister        Liver failure--deceased  . Dementia Maternal Grandmother   . Colon cancer Neg Hx   . Esophageal cancer Neg Hx   . Rectal cancer Neg Hx   . Stomach cancer Neg Hx     Social History Social History   Tobacco Use  . Smoking status: Former Smoker    Packs/day: 0.50    Years: 10.00    Pack years: 5.00    Types: Cigarettes    Quit date: 07/14/1974    Years since quitting: 45.8  . Smokeless tobacco: Never Used  . Tobacco comment: quit in 1976 smoked for approx. 10 years  Substance Use Topics  . Alcohol use: Yes    Comment: wine on occasion. rare.      Are there smokers in your home (other than you)? No  Risk Factors Current exercise habits: walks as able Dietary issues discussed: continue  Healthy diet   Cardiac risk factors: advanced age (older than 4 for men, 70 for women), diabetes mellitus, dyslipidemia, obesity (BMI >= 30 kg/m2) and sedentary lifestyle.  Depression Screen (Note: if answer to either of the following is "Yes", a more  complete depression screening is indicated)   Over the past two weeks, have you felt down, depressed or hopeless? Some depression  Over the past two weeks, have you felt little interest or pleasure in doing things? Some since she stopped prozac  Have you lost interest or pleasure in daily life? no  Do you often feel hopeless? No, but does get bored  Do you cry easily over simple problems?   Activities of Daily Living In your present state of health, do you have any difficulty performing the following activities?:  Driving? No Managing money?  No Feeding yourself? No Getting from bed to chair? No Climbing a flight of stairs? No Preparing food and eating?: No Bathing or showering? No Getting dressed: No Getting to the toilet? No Using the toilet:No Moving around from place to place: No In the past year have you fallen or had a near fall?:Yes- reports 3 falls without injuries.    Are you sexually active?  No  Do you have more than one partner?  No  Hearing Difficulties: No Do you often ask people to speak up or repeat themselves? No Do you experience ringing or noises in your ears? No Do you have difficulty understanding soft or whispered voices? No   Do you feel that you have a problem with  emory? No  Do you often misplace items? occasional  Do you feel safe at home?  No  Cognitive Testing  Alert? No  Normal Appearance?No  Oriented to person? No  Place? Yes   Time? No  Recall of three objects?  No  Can perform simple calculations? No  Displays appropriate judgment?No  Can read the correct time from a watch face?No   Advanced Directives have been discussed with the patient?yes, has advanced directive  List the Names of Other Physician/Practitioners you currently use: 1.  [facility] 2.  [facility]   Indicate any recent Medical Services you may have received from other than [facility] providers in the past year (date may be  approximate).  Immunization History  Administered Date(s) Administered  .  Influenza Split 03/22/2012  . Influenza Whole 04/28/2008, 05/04/2009  . Influenza, High Dose Seasonal PF 04/01/2015, 03/27/2016, 03/13/2017, 04/02/2018, 04/04/2019, 03/24/2020  . Influenza,inj,Quad PF,6+ Mos 03/19/2013, 03/25/2014  . Pneumococcal Conjugate-13 06/25/2013  . Pneumococcal Polysaccharide-23 05/27/2001, 06/18/2012  . Td 05/26/2008  . Zoster 12/01/2010    Screening Tests Health Maintenance  Topic Date Due  . Hepatitis C Screening  Never done  . COVID-19 Vaccine (1) Never done  . TETANUS/TDAP  05/26/2018  . COLONOSCOPY  09/05/2020  . INFLUENZA VACCINE  Completed  . DEXA SCAN  Completed  . PNA vac Low Risk Adult  Completed    All answers were reviewed with the patient and necessary referrals were made:  Nance Pear, NP   05/04/2020   History reviewed: allergies, current medications, past family history, past medical history, past social history, past surgical history and problem list  Review of Systems Pertinent items are noted in HPI.       Objective:  Physical Exam  Constitutional: She is oriented to person, place, and time. She appears well-developed and well-nourished. No distress.  HENT:  Head: Normocephalic and atraumatic.  Right Ear: Tympanic membrane and ear canal normal.  Left Ear: Tympanic membrane and ear canal normal.  Mouth/Throat: Not examined- pt wearing mask Eyes: Pupils are equal, round, and reactive to light. No scleral icterus.  Neck: Normal range of motion. No thyromegaly present.  Cardiovascular: Normal rate and regular rhythm.   No murmur heard. Pulmonary/Chest: Effort normal and breath sounds normal. No respiratory distress. He has no wheezes. She has no rales. She exhibits no tenderness.  Abdominal: Soft. Bowel sounds are normal. She exhibits no distension and no mass. There is no tenderness. There is no rebound and no guarding.  Musculoskeletal: She  exhibits 3-4+ bilateral LE edema Lymphadenopathy:    She has no cervical adenopathy.  Neurological: She is alert and oriented to person, place, and time. She has normal patellar reflexes. She exhibits normal muscle tone. Coordination normal.  Skin: Skin is warm and dry.  Psychiatric: She has a flattened mood and affect. Her behavior is normal. Judgment and thought content normal.  Breast/pelvic: deferred           Assessment & Plan:      HTN- repeat BP 160/84.  Will add hydralazine 25mg  bid.  BP Readings from Last 3 Encounters:  05/04/20 (!) 185/87  04/05/20 (!) 144/90  02/03/20 (!) 181/85   OAB- uncontrolled. Will give trial of myrbetriq.  Depression- uncontrolled since she stopped prozac a few weeks ago. Will restart.  GERD- uncontrol. Prefers to avoid PPI due to side effects. Will give a trial of pepcid 20mg  bid.  CKD- most recent Cr is stable/improved.  Hyperlipidemia- tolerating statin (simvastatin 10mg ). Will continue same.  Obtain follow up lipid panel.  Gait instability- using a 3 pronged cane- recommended rolling walker and PT referral.    Assessment:          Plan:     During the course of the visit the patient was educated and counseled about appropriate screening and preventive services including:    Influenza vaccine  Advanced directives: has advanced directive  Diet review for nutrition referral? Yes ____  Not Indicated ____   Patient Instructions (the written plan) was given to the patient.  Medicare Attestation I have personally reviewed: The patient's medical and social history Their use of alcohol, tobacco or illicit drugs Their current medications and supplements The patient's functional ability including ADLs,fall risks, home safety  risks, cognitive, and hearing and visual impairment Diet and physical activities Evidence for depression or mood disorders  The patient's weight, height, BMI, and visual acuity have been recorded in  the chart.  I have made referrals, counseling, and provided education to the patient based on review of the above and I have provided the patient with a written personalized care plan for preventive services.     Nance Pear, NP   05/04/2020

## 2020-05-05 ENCOUNTER — Encounter: Payer: Self-pay | Admitting: Family

## 2020-05-05 LAB — CBC WITH DIFFERENTIAL/PLATELET
Absolute Monocytes: 777 cells/uL (ref 200–950)
Basophils Absolute: 84 cells/uL (ref 0–200)
Basophils Relative: 0.8 %
Eosinophils Absolute: 116 cells/uL (ref 15–500)
Eosinophils Relative: 1.1 %
HCT: 32.3 % — ABNORMAL LOW (ref 35.0–45.0)
Hemoglobin: 10.8 g/dL — ABNORMAL LOW (ref 11.7–15.5)
Lymphs Abs: 3255 cells/uL (ref 850–3900)
MCH: 32.4 pg (ref 27.0–33.0)
MCHC: 33.4 g/dL (ref 32.0–36.0)
MCV: 97 fL (ref 80.0–100.0)
MPV: 9.1 fL (ref 7.5–12.5)
Monocytes Relative: 7.4 %
Neutro Abs: 6269 cells/uL (ref 1500–7800)
Neutrophils Relative %: 59.7 %
Platelets: 303 10*3/uL (ref 140–400)
RBC: 3.33 10*6/uL — ABNORMAL LOW (ref 3.80–5.10)
RDW: 12.4 % (ref 11.0–15.0)
Total Lymphocyte: 31 %
WBC: 10.5 10*3/uL (ref 3.8–10.8)

## 2020-05-05 LAB — LIPID PANEL
Cholesterol: 167 mg/dL (ref <200)
HDL: 79 mg/dL (ref >=50)
LDL Cholesterol (Calc): 70 mg/dL (calc)
Non-HDL Cholesterol (Calc): 88 mg/dL (calc) (ref <130)
Total CHOL/HDL Ratio: 2.1 (calc) (ref <5.0)
Triglycerides: 94 mg/dL (ref <150)

## 2020-05-05 LAB — HEMOGLOBIN A1C
Hgb A1c MFr Bld: 5.7 % of total Hgb — ABNORMAL HIGH (ref <5.7)
Mean Plasma Glucose: 117 (calc)
eAG (mmol/L): 6.5 (calc)

## 2020-05-05 LAB — B12 AND FOLATE PANEL
Folate: 10.4 ng/mL
Vitamin B-12: 444 pg/mL (ref 200–1100)

## 2020-05-05 LAB — COMPREHENSIVE METABOLIC PANEL
AG Ratio: 1.6 (calc) (ref 1.0–2.5)
ALT: 10 U/L (ref 6–29)
AST: 17 U/L (ref 10–35)
Albumin: 4.2 g/dL (ref 3.6–5.1)
Alkaline phosphatase (APISO): 67 U/L (ref 37–153)
BUN/Creatinine Ratio: 24 (calc) — ABNORMAL HIGH (ref 6–22)
BUN: 28 mg/dL — ABNORMAL HIGH (ref 7–25)
CO2: 23 mmol/L (ref 20–32)
Calcium: 10.6 mg/dL — ABNORMAL HIGH (ref 8.6–10.4)
Chloride: 105 mmol/L (ref 98–110)
Creat: 1.18 mg/dL — ABNORMAL HIGH (ref 0.60–0.93)
Globulin: 2.7 g/dL (calc) (ref 1.9–3.7)
Glucose, Bld: 101 mg/dL — ABNORMAL HIGH (ref 65–99)
Potassium: 4.8 mmol/L (ref 3.5–5.3)
Sodium: 138 mmol/L (ref 135–146)
Total Bilirubin: 0.7 mg/dL (ref 0.2–1.2)
Total Protein: 6.9 g/dL (ref 6.1–8.1)

## 2020-05-07 ENCOUNTER — Telehealth: Payer: Self-pay | Admitting: *Deleted

## 2020-05-07 ENCOUNTER — Encounter: Payer: Self-pay | Admitting: Family

## 2020-05-07 ENCOUNTER — Ambulatory Visit (HOSPITAL_BASED_OUTPATIENT_CLINIC_OR_DEPARTMENT_OTHER)
Admission: RE | Admit: 2020-05-07 | Discharge: 2020-05-07 | Disposition: A | Discharge status: Home / Self Care | Payer: Medicare Other | Source: Ambulatory Visit | Attending: Family | Admitting: Family

## 2020-05-07 ENCOUNTER — Other Ambulatory Visit: Payer: Self-pay

## 2020-05-07 DIAGNOSIS — E210 Primary hyperparathyroidism: Secondary | ICD-10-CM | POA: Diagnosis not present

## 2020-05-07 DIAGNOSIS — Z853 Personal history of malignant neoplasm of breast: Secondary | ICD-10-CM | POA: Diagnosis not present

## 2020-05-07 DIAGNOSIS — M85852 Other specified disorders of bone density and structure, left thigh: Secondary | ICD-10-CM | POA: Diagnosis not present

## 2020-05-07 DIAGNOSIS — M8580 Other specified disorders of bone density and structure, unspecified site: Secondary | ICD-10-CM | POA: Diagnosis not present

## 2020-05-07 NOTE — Telephone Encounter (Signed)
For your information  

## 2020-05-07 NOTE — Telephone Encounter (Signed)
Could you please see if she has any additional questions/concerns about that PT group?  I see we faxed the referral to that group.

## 2020-05-07 NOTE — Telephone Encounter (Signed)
Caller Name [facility] Phone Number [phone] Patient Name [name] Patient DOB 1941-07-06 Call Type Message Only Information Provided Reason for Call Request for General Office Information Initial Comment Caller is calling back to let the office know which PT office she will be going to [facility] at [phone] Additional Comment Please call the patient about the Physical Therapy Group she has decided to go to.

## 2020-05-10 NOTE — Telephone Encounter (Signed)
Patient has no questions at this time "just wanted to pass the information"

## 2020-05-10 NOTE — Telephone Encounter (Signed)
Patient advised of new medication and scheduled for follow up 06-09-2020

## 2020-05-12 DIAGNOSIS — R2689 Other abnormalities of gait and mobility: Secondary | ICD-10-CM | POA: Diagnosis not present

## 2020-05-12 DIAGNOSIS — M62551 Muscle wasting and atrophy, not elsewhere classified, right thigh: Secondary | ICD-10-CM | POA: Diagnosis not present

## 2020-05-12 DIAGNOSIS — M62552 Muscle wasting and atrophy, not elsewhere classified, left thigh: Secondary | ICD-10-CM | POA: Diagnosis not present

## 2020-05-12 DIAGNOSIS — M4005 Postural kyphosis, thoracolumbar region: Secondary | ICD-10-CM | POA: Diagnosis not present

## 2020-05-12 DIAGNOSIS — M62561 Muscle wasting and atrophy, not elsewhere classified, right lower leg: Secondary | ICD-10-CM | POA: Diagnosis not present

## 2020-05-12 DIAGNOSIS — M4003 Postural kyphosis, cervicothoracic region: Secondary | ICD-10-CM | POA: Diagnosis not present

## 2020-05-13 ENCOUNTER — Telehealth: Payer: Self-pay | Admitting: Family

## 2020-05-13 MED ORDER — ALENDRONATE SODIUM 70 MG PO TABS: 70.0000 mg | ORAL_TABLET | ORAL | 4 refills | Status: DC

## 2020-05-13 NOTE — Telephone Encounter (Signed)
Bone density shows osteopenia.  I would recommend that she start fosamax once weekly.  Sit upright for 90 minutes after taking. I sent to [facility].

## 2020-05-13 NOTE — Telephone Encounter (Signed)
Patient notified and patient will start medication.

## 2020-05-14 DIAGNOSIS — M4005 Postural kyphosis, thoracolumbar region: Secondary | ICD-10-CM | POA: Diagnosis not present

## 2020-05-14 DIAGNOSIS — M62552 Muscle wasting and atrophy, not elsewhere classified, left thigh: Secondary | ICD-10-CM | POA: Diagnosis not present

## 2020-05-14 DIAGNOSIS — M4003 Postural kyphosis, cervicothoracic region: Secondary | ICD-10-CM | POA: Diagnosis not present

## 2020-05-14 DIAGNOSIS — M62561 Muscle wasting and atrophy, not elsewhere classified, right lower leg: Secondary | ICD-10-CM | POA: Diagnosis not present

## 2020-05-14 DIAGNOSIS — R2689 Other abnormalities of gait and mobility: Secondary | ICD-10-CM | POA: Diagnosis not present

## 2020-05-14 DIAGNOSIS — M62551 Muscle wasting and atrophy, not elsewhere classified, right thigh: Secondary | ICD-10-CM | POA: Diagnosis not present

## 2020-05-17 DIAGNOSIS — M62552 Muscle wasting and atrophy, not elsewhere classified, left thigh: Secondary | ICD-10-CM | POA: Diagnosis not present

## 2020-05-17 DIAGNOSIS — M4005 Postural kyphosis, thoracolumbar region: Secondary | ICD-10-CM | POA: Diagnosis not present

## 2020-05-17 DIAGNOSIS — M62551 Muscle wasting and atrophy, not elsewhere classified, right thigh: Secondary | ICD-10-CM | POA: Diagnosis not present

## 2020-05-17 DIAGNOSIS — M62561 Muscle wasting and atrophy, not elsewhere classified, right lower leg: Secondary | ICD-10-CM | POA: Diagnosis not present

## 2020-05-17 DIAGNOSIS — R2689 Other abnormalities of gait and mobility: Secondary | ICD-10-CM | POA: Diagnosis not present

## 2020-05-17 DIAGNOSIS — M4003 Postural kyphosis, cervicothoracic region: Secondary | ICD-10-CM | POA: Diagnosis not present

## 2020-05-19 DIAGNOSIS — M62552 Muscle wasting and atrophy, not elsewhere classified, left thigh: Secondary | ICD-10-CM | POA: Diagnosis not present

## 2020-05-19 DIAGNOSIS — M62551 Muscle wasting and atrophy, not elsewhere classified, right thigh: Secondary | ICD-10-CM | POA: Diagnosis not present

## 2020-05-19 DIAGNOSIS — M4005 Postural kyphosis, thoracolumbar region: Secondary | ICD-10-CM | POA: Diagnosis not present

## 2020-05-19 DIAGNOSIS — M4003 Postural kyphosis, cervicothoracic region: Secondary | ICD-10-CM | POA: Diagnosis not present

## 2020-05-19 DIAGNOSIS — M62561 Muscle wasting and atrophy, not elsewhere classified, right lower leg: Secondary | ICD-10-CM | POA: Diagnosis not present

## 2020-05-19 DIAGNOSIS — R2689 Other abnormalities of gait and mobility: Secondary | ICD-10-CM | POA: Diagnosis not present

## 2020-05-24 DIAGNOSIS — M62552 Muscle wasting and atrophy, not elsewhere classified, left thigh: Secondary | ICD-10-CM | POA: Diagnosis not present

## 2020-05-24 DIAGNOSIS — R2689 Other abnormalities of gait and mobility: Secondary | ICD-10-CM | POA: Diagnosis not present

## 2020-05-24 DIAGNOSIS — M62561 Muscle wasting and atrophy, not elsewhere classified, right lower leg: Secondary | ICD-10-CM | POA: Diagnosis not present

## 2020-05-24 DIAGNOSIS — M4003 Postural kyphosis, cervicothoracic region: Secondary | ICD-10-CM | POA: Diagnosis not present

## 2020-05-24 DIAGNOSIS — M62551 Muscle wasting and atrophy, not elsewhere classified, right thigh: Secondary | ICD-10-CM | POA: Diagnosis not present

## 2020-05-24 DIAGNOSIS — M4005 Postural kyphosis, thoracolumbar region: Secondary | ICD-10-CM | POA: Diagnosis not present

## 2020-05-27 DIAGNOSIS — R2689 Other abnormalities of gait and mobility: Secondary | ICD-10-CM | POA: Diagnosis not present

## 2020-05-27 DIAGNOSIS — M62561 Muscle wasting and atrophy, not elsewhere classified, right lower leg: Secondary | ICD-10-CM | POA: Diagnosis not present

## 2020-05-27 DIAGNOSIS — M62551 Muscle wasting and atrophy, not elsewhere classified, right thigh: Secondary | ICD-10-CM | POA: Diagnosis not present

## 2020-05-27 DIAGNOSIS — M62552 Muscle wasting and atrophy, not elsewhere classified, left thigh: Secondary | ICD-10-CM | POA: Diagnosis not present

## 2020-05-27 DIAGNOSIS — M4003 Postural kyphosis, cervicothoracic region: Secondary | ICD-10-CM | POA: Diagnosis not present

## 2020-05-27 DIAGNOSIS — M4005 Postural kyphosis, thoracolumbar region: Secondary | ICD-10-CM | POA: Diagnosis not present

## 2020-06-02 DIAGNOSIS — M62552 Muscle wasting and atrophy, not elsewhere classified, left thigh: Secondary | ICD-10-CM | POA: Diagnosis not present

## 2020-06-02 DIAGNOSIS — M4003 Postural kyphosis, cervicothoracic region: Secondary | ICD-10-CM | POA: Diagnosis not present

## 2020-06-02 DIAGNOSIS — M62551 Muscle wasting and atrophy, not elsewhere classified, right thigh: Secondary | ICD-10-CM | POA: Diagnosis not present

## 2020-06-02 DIAGNOSIS — M4005 Postural kyphosis, thoracolumbar region: Secondary | ICD-10-CM | POA: Diagnosis not present

## 2020-06-02 DIAGNOSIS — R2689 Other abnormalities of gait and mobility: Secondary | ICD-10-CM | POA: Diagnosis not present

## 2020-06-02 DIAGNOSIS — M62561 Muscle wasting and atrophy, not elsewhere classified, right lower leg: Secondary | ICD-10-CM | POA: Diagnosis not present

## 2020-06-09 ENCOUNTER — Ambulatory Visit: Payer: Medicare Other | Admitting: Family

## 2020-06-10 ENCOUNTER — Telehealth: Payer: Self-pay | Admitting: Family

## 2020-06-10 NOTE — Progress Notes (Signed)
  Chronic Care Management   Outreach Note  06/10/2020 Name: [name] MRN: [id number] DOB: 1941-06-25  Referred by: Debbrah Alar, NP Reason for referral : No chief complaint on file.   An unsuccessful telephone outreach was attempted today. The patient was referred to the pharmacist for assistance with care management and care coordination.   Follow Up Plan:   [name] [employer] Scheduler

## 2020-06-16 ENCOUNTER — Other Ambulatory Visit: Payer: Self-pay

## 2020-06-16 ENCOUNTER — Telehealth (INDEPENDENT_AMBULATORY_CARE_PROVIDER_SITE_OTHER): Payer: Medicare Other | Admitting: Family

## 2020-06-16 VITALS — BP 126/75 | HR 64

## 2020-06-16 DIAGNOSIS — I10 Essential (primary) hypertension: Secondary | ICD-10-CM | POA: Diagnosis not present

## 2020-06-16 DIAGNOSIS — R2681 Unsteadiness on feet: Secondary | ICD-10-CM

## 2020-06-16 DIAGNOSIS — N3281 Overactive bladder: Secondary | ICD-10-CM

## 2020-06-16 DIAGNOSIS — E785 Hyperlipidemia, unspecified: Secondary | ICD-10-CM | POA: Diagnosis not present

## 2020-06-16 DIAGNOSIS — J069 Acute upper respiratory infection, unspecified: Secondary | ICD-10-CM | POA: Diagnosis not present

## 2020-06-16 NOTE — Progress Notes (Signed)
Virtual Visit via Video Note  I connected with Keyia Volker on 06/16/20 at 10:20 AM EST by a video enabled telemedicine application and verified that I am speaking with the correct person using two identifiers.  Location: Patient: home Provider: home   I discussed the limitations of evaluation and management by telemedicine and the availability of in person appointments. The patient expressed understanding and agreed to proceed. Only the patient and myself were present for today's video call.   History of Present Illness:  Patient is a 79 yr old female who presents today for follow up.  OAB- last visit we gave her a trial of Myrbetriq. Notes that it helped a lot with her urinary incontinence however after beginning myrbetric,  she developed facial swelling/lip swelling, elevation of her blood pressure and she had SOB.  She discontinued myrbetriq and symptoms resolved.  Weight today is 235. She is using lasix in the AM and then again at [facility].   Wt Readings from Last 3 Encounters:  05/04/20 237 lb (107.5 kg)  04/05/20 236 lb 12.8 oz (107.4 kg)  02/03/20 (!) 232 lb 9.6 oz (105.5 kg)   Depression- last visit pt had discontinued prozac. She had worsening depression symptoms. We resumed her prozac.  Reports mood is better.    Hyperlipidemia- she is maintained on simvastatin 10mg .  Lab Results  Component Value Date   CHOL 167 05/04/2020   HDL 79 05/04/2020   LDLCALC 70 05/04/2020   LDLDIRECT 110.4 12/13/2006   TRIG 94 05/04/2020   CHOLHDL 2.1 05/04/2020   Gait instability- She states that she has been doing PT and it is really helping with her balance and strength.    HTN- maintained on hydralazine 25mg  and diovan 320mg , bisoprolol bid.   BP Readings from Last 3 Encounters:  06/16/20 126/75  05/04/20 (!) 160/64  04/05/20 (!) 144/90    Patient states that everyone at her Thanksgiving celebration came down with "an old fashioned cold." She states that she has had a cough which is  improving. She is using mucinex and vicks prn.  Neither she nor any of her family members have been tested for COVID-19.   Observations/Objective:   Gen: Awake, alert, no acute distress Resp: Breathing is even and non-labored Psych: calm/pleasant demeanor Neuro: Alert and Oriented x 3, + facial symmetry, speech is clear.   Assessment and Plan:  Viral URI with cough.  Recommended that she obtain a home covid-19 test and let me know if she tests positive.  In addition, we discussed supportive measures and to let me know if her symptoms do not continue to improve.  HTN- bp stable. Continue  hydralazine 25mg  and diovan 320mg , bisoprolol 10mg  bid.   Hyperlipidemia- lipids are at goal. Continue simvastatin 10mg .   Gait instability- clinically improving with PT. Continue.  OAB- myrbetriq placed on her allergy list.  She is OK with using pads as needed for incontinence.  Depression- improved back on prozac 10mg  once daily. Continue same.    Follow Up Instructions:    I discussed the assessment and treatment plan with the patient. The patient was provided an opportunity to ask questions and all were answered. The patient agreed with the plan and demonstrated an understanding of the instructions.   The patient was advised to call back or seek an in-person evaluation if the symptoms worsen or if the condition fails to improve as anticipated.  Nance Pear, NP

## 2020-06-19 ENCOUNTER — Other Ambulatory Visit: Payer: Self-pay | Admitting: Family

## 2020-06-19 DIAGNOSIS — Z20822 Contact with and (suspected) exposure to covid-19: Secondary | ICD-10-CM | POA: Diagnosis not present

## 2020-06-30 DIAGNOSIS — M62551 Muscle wasting and atrophy, not elsewhere classified, right thigh: Secondary | ICD-10-CM | POA: Diagnosis not present

## 2020-06-30 DIAGNOSIS — M4003 Postural kyphosis, cervicothoracic region: Secondary | ICD-10-CM | POA: Diagnosis not present

## 2020-06-30 DIAGNOSIS — M62552 Muscle wasting and atrophy, not elsewhere classified, left thigh: Secondary | ICD-10-CM | POA: Diagnosis not present

## 2020-06-30 DIAGNOSIS — R2689 Other abnormalities of gait and mobility: Secondary | ICD-10-CM | POA: Diagnosis not present

## 2020-06-30 DIAGNOSIS — M62561 Muscle wasting and atrophy, not elsewhere classified, right lower leg: Secondary | ICD-10-CM | POA: Diagnosis not present

## 2020-06-30 DIAGNOSIS — M4005 Postural kyphosis, thoracolumbar region: Secondary | ICD-10-CM | POA: Diagnosis not present

## 2020-07-05 DIAGNOSIS — M62561 Muscle wasting and atrophy, not elsewhere classified, right lower leg: Secondary | ICD-10-CM | POA: Diagnosis not present

## 2020-07-05 DIAGNOSIS — M4005 Postural kyphosis, thoracolumbar region: Secondary | ICD-10-CM | POA: Diagnosis not present

## 2020-07-05 DIAGNOSIS — R2689 Other abnormalities of gait and mobility: Secondary | ICD-10-CM | POA: Diagnosis not present

## 2020-07-05 DIAGNOSIS — M62551 Muscle wasting and atrophy, not elsewhere classified, right thigh: Secondary | ICD-10-CM | POA: Diagnosis not present

## 2020-07-05 DIAGNOSIS — M62552 Muscle wasting and atrophy, not elsewhere classified, left thigh: Secondary | ICD-10-CM | POA: Diagnosis not present

## 2020-07-05 DIAGNOSIS — M4003 Postural kyphosis, cervicothoracic region: Secondary | ICD-10-CM | POA: Diagnosis not present

## 2020-07-07 DIAGNOSIS — M4003 Postural kyphosis, cervicothoracic region: Secondary | ICD-10-CM | POA: Diagnosis not present

## 2020-07-07 DIAGNOSIS — R2689 Other abnormalities of gait and mobility: Secondary | ICD-10-CM | POA: Diagnosis not present

## 2020-07-07 DIAGNOSIS — M62551 Muscle wasting and atrophy, not elsewhere classified, right thigh: Secondary | ICD-10-CM | POA: Diagnosis not present

## 2020-07-07 DIAGNOSIS — M62552 Muscle wasting and atrophy, not elsewhere classified, left thigh: Secondary | ICD-10-CM | POA: Diagnosis not present

## 2020-07-07 DIAGNOSIS — M4005 Postural kyphosis, thoracolumbar region: Secondary | ICD-10-CM | POA: Diagnosis not present

## 2020-07-07 DIAGNOSIS — M62561 Muscle wasting and atrophy, not elsewhere classified, right lower leg: Secondary | ICD-10-CM | POA: Diagnosis not present

## 2020-07-13 DIAGNOSIS — M62551 Muscle wasting and atrophy, not elsewhere classified, right thigh: Secondary | ICD-10-CM | POA: Diagnosis not present

## 2020-07-13 DIAGNOSIS — M4003 Postural kyphosis, cervicothoracic region: Secondary | ICD-10-CM | POA: Diagnosis not present

## 2020-07-13 DIAGNOSIS — M62552 Muscle wasting and atrophy, not elsewhere classified, left thigh: Secondary | ICD-10-CM | POA: Diagnosis not present

## 2020-07-13 DIAGNOSIS — M62561 Muscle wasting and atrophy, not elsewhere classified, right lower leg: Secondary | ICD-10-CM | POA: Diagnosis not present

## 2020-07-13 DIAGNOSIS — M4005 Postural kyphosis, thoracolumbar region: Secondary | ICD-10-CM | POA: Diagnosis not present

## 2020-07-13 DIAGNOSIS — R2689 Other abnormalities of gait and mobility: Secondary | ICD-10-CM | POA: Diagnosis not present

## 2020-07-15 DIAGNOSIS — M4003 Postural kyphosis, cervicothoracic region: Secondary | ICD-10-CM | POA: Diagnosis not present

## 2020-07-15 DIAGNOSIS — M62552 Muscle wasting and atrophy, not elsewhere classified, left thigh: Secondary | ICD-10-CM | POA: Diagnosis not present

## 2020-07-15 DIAGNOSIS — R2689 Other abnormalities of gait and mobility: Secondary | ICD-10-CM | POA: Diagnosis not present

## 2020-07-15 DIAGNOSIS — M62561 Muscle wasting and atrophy, not elsewhere classified, right lower leg: Secondary | ICD-10-CM | POA: Diagnosis not present

## 2020-07-15 DIAGNOSIS — M62551 Muscle wasting and atrophy, not elsewhere classified, right thigh: Secondary | ICD-10-CM | POA: Diagnosis not present

## 2020-07-15 DIAGNOSIS — M4005 Postural kyphosis, thoracolumbar region: Secondary | ICD-10-CM | POA: Diagnosis not present

## 2020-07-20 ENCOUNTER — Encounter: Payer: Self-pay | Admitting: Family

## 2020-07-20 DIAGNOSIS — M4003 Postural kyphosis, cervicothoracic region: Secondary | ICD-10-CM | POA: Diagnosis not present

## 2020-07-20 DIAGNOSIS — M4005 Postural kyphosis, thoracolumbar region: Secondary | ICD-10-CM | POA: Diagnosis not present

## 2020-07-20 DIAGNOSIS — M62561 Muscle wasting and atrophy, not elsewhere classified, right lower leg: Secondary | ICD-10-CM | POA: Diagnosis not present

## 2020-07-20 DIAGNOSIS — M62551 Muscle wasting and atrophy, not elsewhere classified, right thigh: Secondary | ICD-10-CM | POA: Diagnosis not present

## 2020-07-20 DIAGNOSIS — R2689 Other abnormalities of gait and mobility: Secondary | ICD-10-CM | POA: Diagnosis not present

## 2020-07-20 DIAGNOSIS — M62552 Muscle wasting and atrophy, not elsewhere classified, left thigh: Secondary | ICD-10-CM | POA: Diagnosis not present

## 2020-07-20 NOTE — Chronic Care Management (AMB) (Signed)
Chronic Care Management Pharmacy  Name: [name]  MRN: [id number] DOB: 04/20/1941  Chief Complaint/ HPI  Teressa Senter,  80 y.o. , female presents for their Initial CCM visit with the clinical pharmacist via telephone.  PCP : Debbrah Alar, NP  Their chronic conditions include: Hypertension/Stage 3b Chronic Kidney Disease, Hyperlipidemia, Pre-Diabetes, Heart Failure, Depression, Osteopenia, GERD, Osteoarthritis, Allergic Rhinitis  Office Visits: 06/16/20: Visit w/ Debbrah Alar, NP - Did trial of myrbetriq and had improvement in OAB symptoms, but had facial/lip swelling, elevated BP and SOB. D/C'd med and symptoms improved. Pt ok with using pads as needed. Myrbetriq added to allergy list. Depression improved with resumption of fluoxetine $RemoveBefor'10mg'IilzgZitLZKy$  daily.  Consult Visit: 04/05/20: Cardio visit w/ Dr. Debara Pickett - ARB switched to valsartan at previous visit. Pt tolerating well. And creatinine slightly lower than previous. No med changes noted.   02/03/20: Cardio visit w/ Dr. Debara Pickett - Recommend switch to valsartan from losartan. Recheck in 1-2 months.   Medications: Outpatient Encounter Medications as of 07/21/2020  Medication Sig Note   alendronate (FOSAMAX) 70 MG tablet Take 1 tablet (70 mg total) by mouth every 7 (seven) days. Take with a full glass of water on an empty stomach.    bisoprolol (ZEBETA) 10 MG tablet Take 2 tablets (20 mg total) by mouth daily. 07/21/2020: Taking #1 tab twice daily since starting hydralazine   famotidine (PEPCID) 20 MG tablet Take 1 tablet (20 mg total) by mouth 2 (two) times daily.    FLUoxetine (PROZAC) 10 MG capsule Take 1 capsule (10 mg total) by mouth daily.    furosemide (LASIX) 20 MG tablet TAKE 1 TABLET EVERY MORNING, TAKE 1 TABLET AFTER 3PM AND MAY TAKE 1 TABLET EVERY EVENING AS NEEDED FOR SWELLING.    gabapentin (NEURONTIN) 100 MG capsule TAKE 1 CAPSULE BY MOUTH THREE TIMES A Amado Andal    hydrALAZINE (APRESOLINE) 25 MG tablet Take 1  tablet (25 mg total) by mouth in the morning and at bedtime.    Omega-3 Fatty Acids (FISH OIL) 1200 MG CAPS Take by mouth.    Polyethyl Glycol-Propyl Glycol (SYSTANE OP) Apply 1 drop to eye daily as needed (dry eyes).    potassium chloride SA (KLOR-CON) 20 MEQ tablet Take 1 tablet (20 mEq total) by mouth daily.    simvastatin (ZOCOR) 10 MG tablet TAKE 1 TABLET EVERY Javarious Elsayed    valsartan (DIOVAN) 320 MG tablet Take 1 tablet (320 mg total) by mouth daily.    fexofenadine (ALLEGRA) 180 MG tablet Take 180 mg by mouth daily as needed. For seasonal allergies    fluticasone (FLONASE) 50 MCG/ACT nasal spray USE 1 SPRAY IN EACH NOSTRIL EVERY Simora Dingee    Lidocaine (ASPERCREME LIDOCAINE) 4 % PTCH Apply 1 patch topically daily as needed (pain).    No facility-administered encounter medications on file as of 07/21/2020.   SDOH Screenings   Alcohol Screen: Not on file  Depression (PHQ2-9): Low Risk    PHQ-2 Score: 3  Financial Resource Strain: Not on file  Food Insecurity: Not on file  Housing: Not on file  Physical Activity: Not on file  Social Connections: Not on file  Stress: Not on file  Tobacco Use: Medium Risk   Smoking Tobacco Use: Former Smoker   Smokeless Tobacco Use: Never Used  Transportation Needs: Not on file    Current Diagnosis/Assessment:  Goals Addressed            This Visit's Progress    [place]  CARE PLAN ENTRY (see longitudinal plan of care for additional care plan information)  Current Barriers:   Chronic Disease Management support, education, and care coordination needs related to Hypertension/Stage 3b Chronic Kidney Disease, Hyperlipidemia, Pre-Diabetes, Heart Failure, Depression, Osteopenia, GERD, Osteoarthritis, Allergic Rhinitis   Hypertension/Stage 3b Chronic Kidney Disease BP Readings from Last 3 Encounters:  06/16/20 126/75  05/04/20 (!) 160/64  04/05/20 (!) 144/90    Pharmacist Clinical Goal(s): o Over the  next 180 days, patient will work with PharmD and providers to maintain BP goal <140/90  Current regimen:   Bisoprolol $RemoveBef'10mg'ARBRDpBfCD$  #2 tabs daily (takes #1 twice daily since starting hydralazine)  Hydralazine $RemoveBefo'25mg'GGtLyddJSEw$  twice daily  Valsartan $RemoveBe'320mg'dTPiiJoMV$  daily PM  Interventions: o Discussed BP goal  o Discussed reason for avoiding NSAIDs like ibuprofen (can affect kidneys and increase blood pressure)  Patient self care activities - Over the next 180 days, patient will: o Check BP daily, document, and provide at future appointments o Ensure daily salt intake < 2300 mg/Meloni Hinz o Avoid NSAIDs  Hyperlipidemia Lab Results  Component Value Date/Time   LDLCALC 70 05/04/2020 02:02 PM   LDLDIRECT 110.4 12/13/2006 09:00 AM    Pharmacist Clinical Goal(s): o Over the next 180 days, patient will work with PharmD and providers to maintain LDL goal < 100  Current regimen:   Fish Oil $Remove'1200mg'VJmVTRE$  daily AM  Simvastatin $RemoveBefo'10mg'uTEgEIUkwnT$  daily HS  Interventions: o Discussed LDL goal   Patient self care activities - Over the next 180 days, patient will: o Maintain cholesterol medication regimen.   Pre-Diabetes Lab Results  Component Value Date/Time   HGBA1C 5.7 (H) 05/04/2020 02:02 PM   HGBA1C 5.9 12/25/2018 08:54 AM    Pharmacist Clinical Goal(s): o Over the next 180 days, patient will work with PharmD and providers to maintain A1c goal <6.5%  Current regimen:  o Diet and exercise management    Interventions: o Discussed a1c goal  Patient self care activities - Over the next 180 days, patient will: o Maintain a1c less than 6.5%  Health Maintenance   Pharmacist Clinical Goal(s) o Over the next 180 days, patient will work with PharmD and providers to complete health maintenance screenings/vaccinations  Interventions: o Recommended patient receive Shingrix vaccine o Recommended patient receive Td booster vaccine  Patient self care activities - Over the next 180 days, patient will: o Receive Shingrix  vaccine o Receive Td booster vaccine   Medication management  Pharmacist Clinical Goal(s): o Over the next 180 days, patient will work with PharmD and providers to maintain optimal medication adherence  [facility]  Interventions o Comprehensive medication review performed. o Continue current medication management strategy  Patient self care activities - Over the next 180 days, patient will: o Focus on medication adherence by filling and taking medications appropriately  o Take medications as prescribed o Report any questions or concerns to PharmD and/or provider(s)  Initial goal documentation        Hypertension/Stage 3b Chronic Kidney Disease   BP goal is:  <140/90   CMP Latest Ref Rng & Units 05/04/2020 02/11/2020 07/22/2019  Glucose 65 - 99 mg/dL 101(H) 106(H) 106(H)  BUN 7 - 25 mg/dL 28(H) 27 38(H)  Creatinine 0.60 - 0.93 mg/dL 1.18(H) 1.44(H) 1.51(H)  Sodium 135 - 146 mmol/L 138 139 136  Potassium 3.5 - 5.3 mmol/L 4.8 5.0 4.7  Chloride 98 - 110 mmol/L 105 103 103  CO2 20 - 32 mmol/L $RemoveB'23 21 25  'fObwKzqn$ Calcium 8.6 - 10.4 mg/dL 10.6(H) 10.3 10.4  Total Protein 6.1 - 8.1 g/dL 6.9 - -  Total Bilirubin 0.2 - 1.2 mg/dL 0.7 - -  Alkaline Phos 39 - 117 U/L - - -  AST 10 - 35 U/L 17 - -  ALT 6 - 29 U/L 10 - -   Lab Results  Component Value Date   CREATININE 1.18 (H) 05/04/2020   BUN 28 (H) 05/04/2020   GFR 33.47 (L) 07/22/2019   GFRNONAA 35 (L) 02/11/2020   GFRAA 41 (L) 02/11/2020   NA 138 05/04/2020   K 4.8 05/04/2020   CALCIUM 10.6 (H) 05/04/2020   CO2 23 05/04/2020   Office blood pressures are  BP Readings from Last 3 Encounters:  06/16/20 126/75  05/04/20 (!) 160/64  04/05/20 (!) 144/90   Patient checks BP at home daily Patient home BP readings are ranging: Reports it is higher in the mornings and better in the evenings. $RemoveBefor'130 70 125 65 135 70 140 70 135 65 'KdHVnNCZytpB$ 140 65 140 80 Avg 135 69.2  Patient has failed these meds in the past: losartan (efficacy),  amlodipine (swelling), clonidine (hypotension), hctz (hyponatremia) Patient is currently controlled on the following medications:   Bisoprolol $RemoveBef'10mg'vUrCgYFkBQ$  #2 tabs daily (takes #1 twice daily since starting hydralazine)  Hydralazine $RemoveBefo'25mg'TdndWuHhPTc$  twice daily  Valsartan $RemoveBe'320mg'LUlgsnLIF$  daily PM  Patient asks about the use of ibuprofen. Denies headaches, chest pains, and dizziness Feels she gets less winded now that she has started PT  We discussed BP goal and how ibuprofen and NSAIDs in general can affect kidneys and increase blood pressure.  Plan -Continue current medications  -Avoid NSAIDs    Hyperlipidemia   LDL goal < 100  Last lipids Lab Results  Component Value Date   CHOL 167 05/04/2020   HDL 79 05/04/2020   LDLCALC 70 05/04/2020   LDLDIRECT 110.4 12/13/2006   TRIG 94 05/04/2020   CHOLHDL 2.1 05/04/2020   Hepatic Function Latest Ref Rng & Units 05/04/2020 07/22/2019 12/25/2018  Total Protein 6.1 - 8.1 g/dL 6.9 - 6.8  Albumin 3.5 - 5.2 g/dL - 4.2 4.4  AST 10 - 35 U/L 17 - 19  ALT 6 - 29 U/L 10 - 12  Alk Phosphatase 39 - 117 U/L - - 62  Total Bilirubin 0.2 - 1.2 mg/dL 0.7 - 0.6  Bilirubin, Direct 0.0 - 0.3 mg/dL - - -     The 10-year ASCVD risk score Mikey Bussing DC Jr., et al., 2013) is: 25.7%   Values used to calculate the score:     Age: 42 years     Sex: Female     Is Non-Hispanic African American: No     Diabetic: No     Tobacco smoker: No     Systolic Blood Pressure: 937 mmHg     Is BP treated: Yes     HDL Cholesterol: 79 mg/dL     Total Cholesterol: 167 mg/dL   Patient has failed these meds in past: None noted Patient is currently controlled on the following medications:   Fish Oil $Remove'1200mg'xJdKDic$  daily AM  Simvastatin $RemoveBefo'10mg'SuCifpWuinc$  daily HS  We discussed:  LDL goal  Plan -Continue current medications  Pre-Diabetes   A1c goal <6.5%  Recent Relevant Labs: Lab Results  Component Value Date/Time   HGBA1C 5.7 (H) 05/04/2020 02:02 PM   HGBA1C 5.9 12/25/2018 08:54 AM   GFR 33.47 (L)  07/22/2019 11:30 AM   GFR 32.77 (L) 12/25/2018 08:54 AM    Patient has failed these meds in past: None noted  Patient is currently controlled on the following medications:  None  We discussed: A1c goal  Plan -Continue control with diet and exercise    Heart Failure   Type: Diastolic  Last ejection fraction: 07/15/2014 NYHA Class: III (marked limitation of activity) AHA HF Stage: C (Heart disease and symptoms present)  Patient has failed these meds in past: None noted  Patient is currently controlled on the following medications:  Furosemide $RemoveBef'20mg'MblItIsVnj$  AM, 3pm, and evening as needed for swelling  Potassium Chloride 58mEq daily  Bisoprolol $RemoveBef'10mg'TNbMsHkkQb$  #2 tabs daily  Hydralazine $RemoveBefo'25mg'bWVtEDcaabI$  twice daily  Valsartan $RemoveBe'320mg'alNdDyKjh$  daily  How often needing 3rd dose of furosemide? Twice a month SOB? Daily, but improved with PT Edema? Yes, but feels it has improved with PT Weighing Daily? No, about once week  Feels swelling in her hands is an indicator of when she should take extra furosemide Using a walker and this causes less SOB than when she uses her cane  We discussed weighing daily; if you gain more than 3 pounds in one Liz Pinho or 5 pounds in one week call your doctor  Plan -Continue current medications   Depression   Depression screen [facility] 2/9 07/21/2020 06/16/2020 05/01/2019  Decreased Interest 0 0 0  Down, Depressed, Hopeless 0 0 0  PHQ - 2 Score 0 0 0  Altered sleeping 1 - -  Tired, decreased energy 0 - -  Change in appetite 0 - -  Feeling bad or failure about yourself  0 - -  Trouble concentrating 1 - -  Moving slowly or fidgety/restless 1 - -  Suicidal thoughts 0 - -  PHQ-9 Score 3 - -  Difficult doing work/chores - - -  Some recent data might be hidden    Patient has failed these meds in past: None noted  Patient is currently controlled on the following medications:   Fluoxetine $RemoveBef'10mg'uiOTRlmwWh$  daily AM  Plan -Continue current medications   Osteopenia    Last DEXA Scan:  05/13/2020  T-Score femoral neck: -1.8 (-1.9 in 2018)  T-Score forearm radius: -1.8 (-1.2 in 2018)  VITD  Date Value Ref Range Status  07/22/2019 65.63 30.00 - 100.00 ng/mL Final    Patient has failed these meds in past: None noted  Patient is currently controlled on the following medications:   Alendronate $RemoveBefo'70mg'DMBdiEeiwTv$  weekly Mondays AM  Was taken off of vitamin D about a year ago due to vitamin D being too high Her calcium was also elevated. May not be the best candidate for vitamin D and calcium supplementation.  Plan -Continue current medications   GERD   Patient has failed these meds in past: omeprazole (concern with kidneys) Patient is currently controlled on the following medications:   Famotidine $RemoveBef'20mg'NFrRZMNrVi$  twice daily  Previously took omeprazole, but wanted to be off of it due to kidney concerns  Breakthrough Sx: Ocassionally Breakthrough Tx: Pepto, but rarely used Triggers: Fast food, greasy food  Plan -Continue current medications   Osteoarthritis (Back and ankle pain per patient)    Patient has failed these meds in past: None noted  Patient is currently controlled on the following medications:  Gabapentin $RemoveBef'100mg'QIgDqZjyrF$  three times daily  Acetaminophen $RemoveBefore'650mg'fAPOgSYUjhoeL$  #2 three times daily  We discussed:  Max dose of tylenol per Mako Pelfrey as 4,$Remove'000mg'gViGWax$  and that patient is ok with current dosing  Plan -Continue current medications   Allergic Rhinitis     Patient has failed these meds in past: None noted  Patient is currently controlled on the following medications:  Fexofenadine '180mg'$  as needed   Fluticasone 52mcg 1 spray in each nostril daily as needed  Plan -Continue current medications   Vaccines   Reviewed and discussed patient's vaccination history.    Immunization History  Administered Date(s) Administered   Influenza Split 03/22/2012   Influenza Whole 04/28/2008, 05/04/2009   Influenza, High Dose Seasonal PF 04/01/2015, 03/27/2016, 03/13/2017, 04/02/2018, 04/04/2019,  03/24/2020   Influenza,inj,Quad PF,6+ Mos 03/19/2013, 03/25/2014   PFIZER SARS-COV-2 Vaccination 09/11/2019, 09/30/2019, 04/23/2020   Pneumococcal Conjugate-13 06/25/2013   Pneumococcal Polysaccharide-23 05/27/2001, 06/18/2012   Td 05/26/2008   Zoster 12/01/2010   Shingrix? No, but plans to Td booster? No  Plan -Recommended patient receive Shingrix vaccine in pharmacy.  -Recommended patient receive Td booster vaccine in pharmacy.  Medication Management   Patient's preferred pharmacy is:  [id number] [place], [place] - [address] [place] [address] Phone: [phone] Fax: [phone]  [facility] - [address] [place] [address] Phone: [phone] Fax: [phone]  Uses pill box? No - Has her meds sectioned off in a drawer Pt endorses 100% compliance  Miscellaneous Meds Lidocaine 4% patch Systane eye drop   Plan  Continue current medication management strategy   Follow up: 6 month phone visit   De Blanch, PharmD, BCACP Clinical Pharmacist [facility] [phone]    .

## 2020-07-21 ENCOUNTER — Ambulatory Visit: Payer: Medicare Other | Admitting: Pharmacist

## 2020-07-21 ENCOUNTER — Other Ambulatory Visit: Payer: Self-pay

## 2020-07-21 DIAGNOSIS — R739 Hyperglycemia, unspecified: Secondary | ICD-10-CM

## 2020-07-21 DIAGNOSIS — N1832 Chronic kidney disease, stage 3b: Secondary | ICD-10-CM

## 2020-07-21 DIAGNOSIS — E785 Hyperlipidemia, unspecified: Secondary | ICD-10-CM

## 2020-07-21 DIAGNOSIS — I10 Essential (primary) hypertension: Secondary | ICD-10-CM

## 2020-07-21 NOTE — Patient Instructions (Addendum)
Visit Information  Goals Addressed            This Visit's Progress   . Chronic Care Management Pharmacy Care Plan       CARE PLAN ENTRY (see longitudinal plan of care for additional care plan information)  Current Barriers:  . Chronic Disease Management support, education, and care coordination needs related to Hypertension/Stage 3b Chronic Kidney Disease, Hyperlipidemia, Pre-Diabetes, Heart Failure, Depression, Osteopenia, GERD, Osteoarthritis, Allergic Rhinitis   Hypertension/Stage 3b Chronic Kidney Disease BP Readings from Last 3 Encounters:  06/16/20 126/75  05/04/20 (!) 160/64  04/05/20 (!) 144/90   . Pharmacist Clinical Goal(s): o Over the next 180 days, patient will work with PharmD and providers to maintain BP goal <140/90 . Current regimen:  . Bisoprolol 10mg  #2 tabs daily (takes #1 twice daily since starting hydralazine) . Hydralazine 25mg  twice daily . Valsartan 320mg  daily PM . Interventions: o Discussed BP goal  o Discussed reason for avoiding NSAIDs like ibuprofen (can affect kidneys and increase blood pressure) . Patient self care activities - Over the next 180 days, patient will: o Check BP daily, document, and provide at future appointments o Ensure daily salt intake < 2300 mg/Philipp Callegari o Avoid NSAIDs  Hyperlipidemia Lab Results  Component Value Date/Time   LDLCALC 70 05/04/2020 02:02 PM   LDLDIRECT 110.4 12/13/2006 09:00 AM   . Pharmacist Clinical Goal(s): o Over the next 180 days, patient will work with PharmD and providers to maintain LDL goal < 100 . Current regimen:  . Fish Oil 1200mg  daily AM . Simvastatin 10mg  daily HS . Interventions: o Discussed LDL goal  . Patient self care activities - Over the next 180 days, patient will: o Maintain cholesterol medication regimen.   Pre-Diabetes Lab Results  Component Value Date/Time   HGBA1C 5.7 (H) 05/04/2020 02:02 PM   HGBA1C 5.9 12/25/2018 08:54 AM   . Pharmacist Clinical Goal(s): o Over the next  180 days, patient will work with PharmD and providers to maintain A1c goal <6.5% . Current regimen:  o Diet and exercise management   . Interventions: o Discussed a1c goal . Patient self care activities - Over the next 180 days, patient will: o Maintain a1c less than 6.5%  Health Maintenance  . Pharmacist Clinical Goal(s) o Over the next 180 days, patient will work with PharmD and providers to complete health maintenance screenings/vaccinations . Interventions: o Recommended patient receive Shingrix vaccine o Recommended patient receive Td booster vaccine . Patient self care activities - Over the next 180 days, patient will: o Receive Shingrix vaccine o Receive Td booster vaccine   Medication management . Pharmacist Clinical Goal(s): o Over the next 180 days, patient will work with PharmD and providers to maintain optimal medication adherence . [facility] . Interventions o Comprehensive medication review performed. o Continue current medication management strategy . Patient self care activities - Over the next 180 days, patient will: o Focus on medication adherence by filling and taking medications appropriately  o Take medications as prescribed o Report any questions or concerns to PharmD and/or provider(s)  Initial goal documentation        Ms. Clemence was given information about Chronic Care Management services today including:  1. CCM service includes personalized support from designated clinical staff supervised by her physician, including individualized plan of care and coordination with other care providers 2. 24/7 contact phone numbers for assistance for urgent and routine care needs. 3. Standard insurance, coinsurance, copays and deductibles apply for chronic care  management only during months in which we provide at least 20 minutes of these services. Most insurances cover these services at 100%, however patients may be responsible for any copay, coinsurance  and/or deductible if applicable. This service may help you avoid the need for more expensive face-to-face services. 4. Only one practitioner may furnish and bill the service in a calendar month. 5. The patient may stop CCM services at any time (effective at the end of the month) by phone call to the office staff.  Patient agreed to services and verbal consent obtained.   The patient verbalized understanding of instructions, educational materials, and care plan provided today and agreed to receive a mailed copy of patient instructions, educational materials, and care plan.  Telephone follow up appointment with pharmacy team member scheduled for: 01/18/2021  Melvenia Beam Idali Lafever, [facility]

## 2020-07-22 DIAGNOSIS — M62552 Muscle wasting and atrophy, not elsewhere classified, left thigh: Secondary | ICD-10-CM | POA: Diagnosis not present

## 2020-07-22 DIAGNOSIS — M62551 Muscle wasting and atrophy, not elsewhere classified, right thigh: Secondary | ICD-10-CM | POA: Diagnosis not present

## 2020-07-22 DIAGNOSIS — M62561 Muscle wasting and atrophy, not elsewhere classified, right lower leg: Secondary | ICD-10-CM | POA: Diagnosis not present

## 2020-07-22 DIAGNOSIS — R2689 Other abnormalities of gait and mobility: Secondary | ICD-10-CM | POA: Diagnosis not present

## 2020-07-22 DIAGNOSIS — M4003 Postural kyphosis, cervicothoracic region: Secondary | ICD-10-CM | POA: Diagnosis not present

## 2020-07-22 DIAGNOSIS — M4005 Postural kyphosis, thoracolumbar region: Secondary | ICD-10-CM | POA: Diagnosis not present

## 2020-07-29 DIAGNOSIS — R2689 Other abnormalities of gait and mobility: Secondary | ICD-10-CM | POA: Diagnosis not present

## 2020-07-29 DIAGNOSIS — M4003 Postural kyphosis, cervicothoracic region: Secondary | ICD-10-CM | POA: Diagnosis not present

## 2020-07-29 DIAGNOSIS — M62561 Muscle wasting and atrophy, not elsewhere classified, right lower leg: Secondary | ICD-10-CM | POA: Diagnosis not present

## 2020-07-29 DIAGNOSIS — M62552 Muscle wasting and atrophy, not elsewhere classified, left thigh: Secondary | ICD-10-CM | POA: Diagnosis not present

## 2020-07-29 DIAGNOSIS — M62551 Muscle wasting and atrophy, not elsewhere classified, right thigh: Secondary | ICD-10-CM | POA: Diagnosis not present

## 2020-07-29 DIAGNOSIS — M4005 Postural kyphosis, thoracolumbar region: Secondary | ICD-10-CM | POA: Diagnosis not present

## 2020-08-04 DIAGNOSIS — R2689 Other abnormalities of gait and mobility: Secondary | ICD-10-CM | POA: Diagnosis not present

## 2020-08-04 DIAGNOSIS — M4003 Postural kyphosis, cervicothoracic region: Secondary | ICD-10-CM | POA: Diagnosis not present

## 2020-08-04 DIAGNOSIS — M62561 Muscle wasting and atrophy, not elsewhere classified, right lower leg: Secondary | ICD-10-CM | POA: Diagnosis not present

## 2020-08-04 DIAGNOSIS — M4005 Postural kyphosis, thoracolumbar region: Secondary | ICD-10-CM | POA: Diagnosis not present

## 2020-08-04 DIAGNOSIS — M62552 Muscle wasting and atrophy, not elsewhere classified, left thigh: Secondary | ICD-10-CM | POA: Diagnosis not present

## 2020-08-04 DIAGNOSIS — M62551 Muscle wasting and atrophy, not elsewhere classified, right thigh: Secondary | ICD-10-CM | POA: Diagnosis not present

## 2020-08-06 DIAGNOSIS — R2689 Other abnormalities of gait and mobility: Secondary | ICD-10-CM | POA: Diagnosis not present

## 2020-08-06 DIAGNOSIS — M4003 Postural kyphosis, cervicothoracic region: Secondary | ICD-10-CM | POA: Diagnosis not present

## 2020-08-06 DIAGNOSIS — M62552 Muscle wasting and atrophy, not elsewhere classified, left thigh: Secondary | ICD-10-CM | POA: Diagnosis not present

## 2020-08-06 DIAGNOSIS — M4005 Postural kyphosis, thoracolumbar region: Secondary | ICD-10-CM | POA: Diagnosis not present

## 2020-08-06 DIAGNOSIS — M62561 Muscle wasting and atrophy, not elsewhere classified, right lower leg: Secondary | ICD-10-CM | POA: Diagnosis not present

## 2020-08-06 DIAGNOSIS — M62551 Muscle wasting and atrophy, not elsewhere classified, right thigh: Secondary | ICD-10-CM | POA: Diagnosis not present

## 2020-08-10 ENCOUNTER — Ambulatory Visit: Payer: Medicare Other | Admitting: Family

## 2020-08-11 ENCOUNTER — Ambulatory Visit (INDEPENDENT_AMBULATORY_CARE_PROVIDER_SITE_OTHER): Payer: Medicare Other | Admitting: Family

## 2020-08-11 ENCOUNTER — Other Ambulatory Visit: Payer: Self-pay

## 2020-08-11 ENCOUNTER — Ambulatory Visit (HOSPITAL_BASED_OUTPATIENT_CLINIC_OR_DEPARTMENT_OTHER)
Admission: RE | Admit: 2020-08-11 | Discharge: 2020-08-11 | Disposition: A | Discharge status: Home / Self Care | Payer: Medicare Other | Source: Ambulatory Visit | Attending: Family | Admitting: Family

## 2020-08-11 VITALS — BP 154/73 | HR 73 | Temp 98.3°F | Resp 18 | Ht 66.0 in | Wt 247.0 lb

## 2020-08-11 DIAGNOSIS — R0609 Other forms of dyspnea: Secondary | ICD-10-CM

## 2020-08-11 DIAGNOSIS — G4733 Obstructive sleep apnea (adult) (pediatric): Secondary | ICD-10-CM | POA: Diagnosis not present

## 2020-08-11 DIAGNOSIS — R0600 Dyspnea, unspecified: Secondary | ICD-10-CM

## 2020-08-11 DIAGNOSIS — I509 Heart failure, unspecified: Secondary | ICD-10-CM

## 2020-08-11 DIAGNOSIS — J811 Chronic pulmonary edema: Secondary | ICD-10-CM | POA: Diagnosis not present

## 2020-08-11 LAB — COMPREHENSIVE METABOLIC PANEL
ALT: 9 U/L (ref 0–35)
AST: 18 U/L (ref 0–37)
Albumin: 4.3 g/dL (ref 3.5–5.2)
Alkaline Phosphatase: 69 U/L (ref 39–117)
BUN: 33 mg/dL — ABNORMAL HIGH (ref 6–23)
CO2: 27 mEq/L (ref 19–32)
Calcium: 10.8 mg/dL — ABNORMAL HIGH (ref 8.4–10.5)
Chloride: 103 mEq/L (ref 96–112)
Creatinine, Ser: 1.67 mg/dL — ABNORMAL HIGH (ref 0.40–1.20)
GFR: 29.39 mL/min — ABNORMAL LOW (ref >60.00)
Glucose, Bld: 104 mg/dL — ABNORMAL HIGH (ref 70–99)
Potassium: 5.4 mEq/L — ABNORMAL HIGH (ref 3.5–5.1)
Sodium: 136 mEq/L (ref 135–145)
Total Bilirubin: 0.6 mg/dL (ref 0.2–1.2)
Total Protein: 7 g/dL (ref 6.0–8.3)

## 2020-08-11 LAB — CBC WITH DIFFERENTIAL/PLATELET
Basophils Absolute: 0.1 10*3/uL (ref 0.0–0.1)
Basophils Relative: 0.9 % (ref 0.0–3.0)
Eosinophils Absolute: 0.1 10*3/uL (ref 0.0–0.7)
Eosinophils Relative: 1.2 % (ref 0.0–5.0)
HCT: 31.3 % — ABNORMAL LOW (ref 36.0–46.0)
Hemoglobin: 10.3 g/dL — ABNORMAL LOW (ref 12.0–15.0)
Lymphocytes Relative: 18 % (ref 12.0–46.0)
Lymphs Abs: 1.6 10*3/uL (ref 0.7–4.0)
MCHC: 32.9 g/dL (ref 30.0–36.0)
MCV: 99.2 fl (ref 78.0–100.0)
Monocytes Absolute: 0.9 10*3/uL (ref 0.1–1.0)
Monocytes Relative: 10.1 % (ref 3.0–12.0)
Neutro Abs: 6 10*3/uL (ref 1.4–7.7)
Neutrophils Relative %: 69.8 % (ref 43.0–77.0)
Platelets: 279 10*3/uL (ref 150.0–400.0)
RBC: 3.16 Mil/uL — ABNORMAL LOW (ref 3.87–5.11)
RDW: 13.5 % (ref 11.5–15.5)
WBC: 8.7 10*3/uL (ref 4.0–10.5)

## 2020-08-11 LAB — BRAIN NATRIURETIC PEPTIDE: Pro B Natriuretic peptide (BNP): 587 pg/mL — ABNORMAL HIGH (ref 0.0–100.0)

## 2020-08-11 NOTE — Patient Instructions (Addendum)
Please increase lasix to 3 times daily. Complete lab work prior to leaving. Complete chest xray prior to leaving.

## 2020-08-11 NOTE — Progress Notes (Signed)
Subjective:    Patient ID: [name], female    DOB: 1941/04/13, 80 y.o.   MRN: [id number]  HPI  Patient is a 80 yr old female who presents today for follow up.  She reports that she has been taking 3 furosemide for the last 3 days.  She noted that her weight had really jumped up and that the swelling in her legs had worsened. Wt Readings from Last 3 Encounters:  08/11/20 247 lb (112 kg)  05/04/20 237 lb (107.5 kg)  04/05/20 236 lb 12.8 oz (107.4 kg)   She reports a few episodes at PT where she has become "really short of breath." Had one episode of lightheadened.  She denies palpitations or chest. Denies PND.     Review of Systems See HPI  Past Medical History:  Diagnosis Date  . Allergic rhinitis   . Ankle fracture    Left, Mortise Widening  . Arthritis    "knuckles" (06/17/2012)  . 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 Breast cancer ([place])    "right" (06/17/2012)  . Cataracts, bilateral   . Dyspnea    with exertion  . Exertional dyspnea   . GERD (gastroesophageal reflux disease)   . Hypertension   . Iron deficiency anemia    "hematologist watches it" (06/17/2012)  . Obesity   . OSA treated with BiPAP 03/08/2016  . Osteoarthritis    severe right hip osteoarthritis-s/p hip replacement  . Osteopenia 11/26/2009  . Thyroid disorder    "sees endocrinologist yearly" (06/17/2012)     Social History   Socioeconomic History  . Marital status: Widowed    Spouse name: Not on file  . Number of children: Not on file  . Years of education: Not on file  . Highest education level: Not on file  Occupational History  . Not on file  Tobacco Use  . Smoking status: Former Smoker    Packs/day: 0.50    Years: 10.00    Pack years: 5.00    Types: Cigarettes    Quit date: 07/14/1974    Years since quitting: 46.1  . Smokeless tobacco: Never Used  . Tobacco comment: quit in 1976 smoked for approx. 10 years  Vaping Use  . Vaping Use: Never used  Substance and Sexual  Activity  . Alcohol use: Yes    Comment: wine on occasion. rare.   . Drug use: No  . Sexual activity: Never  Other Topics Concern  . Not on file  Social History Narrative   Retired   The patient is married and has one child and two grandchildren that live in the area.     She drinks wine with dinner.  She quit smoking in 1976, smoked for   approximately 10 years.      Moved to [place] from [place], [place]         Social Determinants of Health   Financial Resource Strain: Not on file  Food Insecurity: Not on file  Transportation Needs: Not on file  Physical Activity: Not on file  Stress: Not on file  Social Connections: Not on file  Intimate Partner Violence: Not on file    Past Surgical History:  Procedure Laterality Date  . ANKLE FRACTURE SURGERY Right 01/01/14   Has pins. Procedure performed at [facility]   "bilaterlly" (06/17/2012)  . COLONOSCOPY W/ POLYPECTOMY    . EYE SURGERY  2011   cataract removal both eyes  . HIP CLOSED REDUCTION  07/26/2012   Procedure: CLOSED MANIPULATION HIP;  Surgeon: Nita Sells, MD;  Location: [facility];  Service: Orthopedics;  Laterality: Right;  . LUMBAR LAMINECTOMY  10/26/2017  . LUMBAR LAMINECTOMY/DECOMPRESSION MICRODISCECTOMY Bilateral 10/25/2017   Procedure: LUMBAR 2-3, LUMBAR 3-4 DECOMPRESSION TIME REQUESTED 4.5 HOURS;  Surgeon: Phylliss Bob, MD;  Location: [facility];  Service: Orthopedics;  Laterality: Bilateral;  . MASTECTOMY  1993?   bilateral mastectomy  . ORIF ANKLE FRACTURE Left 07/30/2017   Procedure: LEFT OPEN REDUCTION INTERNAL FIXATION (ORIF) ANKLE FRACTURE WITH SYDESMOTIC FIXATION;  Surgeon: Dorna Leitz, MD;  Location: [facility];  Service: Orthopedics;  Laterality: Left;  . TONSILLECTOMY  1949  . TOTAL HIP ARTHROPLASTY  04/2006   right hip   . TOTAL HIP REVISION  06/17/2012   "right" (06/17/2012)  . TOTAL HIP REVISION  06/17/2012   Procedure: TOTAL HIP REVISION;  Surgeon: Kerin Salen,  MD;  Location: [facility];  Service: Orthopedics;  Laterality: Right;    Family History  Problem Relation Age of Onset  . Heart failure Father        deceased age 83  . Diabetes Father   . Alcohol abuse Father   . Breast cancer Mother        deceased at age 73 secondary to breast cancer  . Liver disease Sister        Liver failure--deceased  . Dementia Maternal Grandmother   . Colon cancer Neg Hx   . Esophageal cancer Neg Hx   . Rectal cancer Neg Hx   . Stomach cancer Neg Hx     Allergies  Allergen Reactions  . Amlodipine Swelling    Swelling   . Sulfonamide Derivatives Swelling    REACTION: Swelling of the face; "eyes swelled shut"  . Myrbetriq [Mirabegron]     Facial swelling, sob  . Chocolate Other (See Comments)    Sinus problems    Current Outpatient Medications on File Prior to Visit  Medication Sig Dispense Refill  . alendronate (FOSAMAX) 70 MG tablet Take 1 tablet (70 mg total) by mouth every 7 (seven) days. Take with a full glass of water on an empty stomach. 12 tablet 4  . bisoprolol (ZEBETA) 10 MG tablet Take 2 tablets (20 mg total) by mouth daily. 180 tablet 3  . famotidine (PEPCID) 20 MG tablet Take 1 tablet (20 mg total) by mouth 2 (two) times daily. 180 tablet 1  . fexofenadine (ALLEGRA) 180 MG tablet Take 180 mg by mouth daily as needed. For seasonal allergies    . FLUoxetine (PROZAC) 10 MG capsule Take 1 capsule (10 mg total) by mouth daily. 90 capsule 1  . fluticasone (FLONASE) 50 MCG/ACT nasal spray USE 1 SPRAY IN EACH NOSTRIL EVERY DAY 32 g 1  . furosemide (LASIX) 20 MG tablet TAKE 1 TABLET EVERY MORNING, TAKE 1 TABLET AFTER 3PM AND MAY TAKE 1 TABLET EVERY EVENING AS NEEDED FOR SWELLING. 270 tablet 0  . gabapentin (NEURONTIN) 100 MG capsule TAKE 1 CAPSULE BY MOUTH THREE TIMES A DAY 270 capsule 2  . hydrALAZINE (APRESOLINE) 25 MG tablet Take 1 tablet (25 mg total) by mouth in the morning and at bedtime. 180 tablet 1  . Lidocaine 4 % PTCH Apply 1 patch  topically daily as needed (pain).    . Omega-3 Fatty Acids (FISH OIL) 1200 MG CAPS Take by mouth.    Vladimir Faster Glycol-Propyl Glycol (SYSTANE OP) Apply 1 drop to eye daily as needed (dry eyes).    Marland Kitchen  potassium chloride SA (KLOR-CON) 20 MEQ tablet Take 1 tablet (20 mEq total) by mouth daily. 90 tablet 3  . simvastatin (ZOCOR) 10 MG tablet TAKE 1 TABLET EVERY DAY 90 tablet 0  . valsartan (DIOVAN) 320 MG tablet Take 1 tablet (320 mg total) by mouth daily. 90 tablet 3   No current facility-administered medications on file prior to visit.    BP (!) 154/73 (BP Location: Right Arm, Patient Position: Sitting, Cuff Size: Small)   Pulse 73   Temp 98.3 F (36.8 C) (Oral)   Resp 18   Ht 5\' 6"  (1.676 m)   Wt 247 lb (112 kg)   LMP 07/11/1991   SpO2 99%   BMI 39.87 kg/m       Objective:   Physical Exam Constitutional:      Appearance: She is well-developed and well-nourished.     Comments: She is noted to have some puffiness in her cheeks and eyelids  Cardiovascular:     Rate and Rhythm: Normal rate and regular rhythm.     Heart sounds: Normal heart sounds. No murmur heard.   Pulmonary:     Effort: Pulmonary effort is normal. No respiratory distress.     Breath sounds: Normal breath sounds. No wheezing.  Musculoskeletal:     Right lower leg: 4+ Edema present.     Left lower leg: 4+ Edema present.  Psychiatric:        Mood and Affect: Mood and affect normal.        Behavior: Behavior normal.        Thought Content: Thought content normal.        Judgment: Judgment normal.           Assessment & Plan:  CHF exacerbation-will repeat 2D echo.  She is already established with cardiology,And will arrange follow-up appointment with cardiology. A BN P is performed and is noted to be elevated.  Chest x-ray is performed and does not note any pulmonary edema.  I have advised the patient to continue Lasix 20 mg p.o. 3 times daily until she sees me back early next week.  We did check a basic  metabolic panel.  Patient noted that she has been compliant with the K-Dur 20 milliequivalents once daily.  She is noted to have elevated potassium level and we will advise her to hold potassium until we can recheck her levels next week.  Obstructive sleep apnea-the patient admits to noncompliance with her BiPAP.  I have advised her to resume BiPAP as lack of compliance could be contributing to her heart failure symptoms.  This visit occurred during the SARS-CoV-2 public health emergency.  Safety protocols were in place, including screening questions prior to the visit, additional usage of staff PPE, and extensive cleaning of exam room while observing appropriate contact time as indicated for disinfecting solutions.

## 2020-08-12 ENCOUNTER — Telehealth: Payer: Self-pay | Admitting: Family

## 2020-08-12 NOTE — Telephone Encounter (Signed)
Potassium level is high. Please hold potassium until we see her back next week.    Chest x-ray looks good.  No pneumonia, no obvious fluid on her lungs.  Blood work suggests she is holding on to extra fluid in her body however.  Please continue lasix 3x daily until we see her back.

## 2020-08-12 NOTE — Telephone Encounter (Signed)
Dear Dr. Debara Pickett, I saw Ms. Grupe yesterday and noted that she was quite volume overloaded.  I have increased her Lasix and ordered a follow-up 2D echo.  Could you or one of your colleagues get her back in to cardiology in the next few weeks please?

## 2020-08-12 NOTE — Telephone Encounter (Signed)
Patient advised of results, provider's comments and to dc potassium supplements.  She will also continue lasix and follow up next week as scheduled.

## 2020-08-13 DIAGNOSIS — M62561 Muscle wasting and atrophy, not elsewhere classified, right lower leg: Secondary | ICD-10-CM | POA: Diagnosis not present

## 2020-08-13 DIAGNOSIS — M4003 Postural kyphosis, cervicothoracic region: Secondary | ICD-10-CM | POA: Diagnosis not present

## 2020-08-13 DIAGNOSIS — M62552 Muscle wasting and atrophy, not elsewhere classified, left thigh: Secondary | ICD-10-CM | POA: Diagnosis not present

## 2020-08-13 DIAGNOSIS — R2689 Other abnormalities of gait and mobility: Secondary | ICD-10-CM | POA: Diagnosis not present

## 2020-08-13 DIAGNOSIS — M4005 Postural kyphosis, thoracolumbar region: Secondary | ICD-10-CM | POA: Diagnosis not present

## 2020-08-13 DIAGNOSIS — M62551 Muscle wasting and atrophy, not elsewhere classified, right thigh: Secondary | ICD-10-CM | POA: Diagnosis not present

## 2020-08-14 NOTE — Telephone Encounter (Signed)
Thanks for taking care of her .. we'll try to get her in soon to evaluate.  Regards,  -Mali

## 2020-08-16 NOTE — Telephone Encounter (Signed)
Message sent to [facility] pool to assist with follow up visit, per MD. Possibly moving echo sooner if able

## 2020-08-17 ENCOUNTER — Encounter: Payer: Self-pay | Admitting: Family

## 2020-08-17 ENCOUNTER — Other Ambulatory Visit: Payer: Self-pay

## 2020-08-17 ENCOUNTER — Ambulatory Visit (INDEPENDENT_AMBULATORY_CARE_PROVIDER_SITE_OTHER): Payer: Medicare Other | Admitting: Family

## 2020-08-17 VITALS — BP 151/69 | HR 72 | Temp 98.5°F | Resp 16 | Ht 66.0 in | Wt 246.0 lb

## 2020-08-17 DIAGNOSIS — I509 Heart failure, unspecified: Secondary | ICD-10-CM | POA: Diagnosis not present

## 2020-08-17 DIAGNOSIS — G4733 Obstructive sleep apnea (adult) (pediatric): Secondary | ICD-10-CM | POA: Diagnosis not present

## 2020-08-17 DIAGNOSIS — E875 Hyperkalemia: Secondary | ICD-10-CM

## 2020-08-17 NOTE — Progress Notes (Signed)
Subjective:    Patient ID: [name], female    DOB: 11/23/1940, 80 y.o.   MRN: [id number]  HPI  Patient is a 80 yr old female who presents today for follow up of her CHF.  Last visit we increased her lasix to TID. Reports that she is feeling a lot better on this dosing schedule.  She stopped her potassium due to hyperkalemia. She feels like her breathing is better and the swelling in her legs is better.   Wt Readings from Last 3 Encounters:  08/17/20 246 lb (111.6 kg)  08/11/20 247 lb (112 kg)  05/04/20 237 lb (107.5 kg)   OSA- not using bipap.  Review of Systems See HPI  Past Medical History:  Diagnosis Date  . Allergic rhinitis   . Ankle fracture    Left, Mortise Widening  . Arthritis    "knuckles" (06/17/2012)  . 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 Breast cancer ([place])    "right" (06/17/2012)  . Cataracts, bilateral   . Dyspnea    with exertion  . Exertional dyspnea   . GERD (gastroesophageal reflux disease)   . Hypertension   . Iron deficiency anemia    "hematologist watches it" (06/17/2012)  . Obesity   . OSA treated with BiPAP 03/08/2016  . Osteoarthritis    severe right hip osteoarthritis-s/p hip replacement  . Osteopenia 11/26/2009  . Thyroid disorder    "sees endocrinologist yearly" (06/17/2012)     Social History   Socioeconomic History  . Marital status: Widowed    Spouse name: Not on file  . Number of children: Not on file  . Years of education: Not on file  . Highest education level: Not on file  Occupational History  . Not on file  Tobacco Use  . Smoking status: Former Smoker    Packs/day: 0.50    Years: 10.00    Pack years: 5.00    Types: Cigarettes    Quit date: 07/14/1974    Years since quitting: 46.1  . Smokeless tobacco: Never Used  . Tobacco comment: quit in 1976 smoked for approx. 10 years  Vaping Use  . Vaping Use: Never used  Substance and Sexual Activity  . Alcohol use: Yes    Comment: wine on occasion. rare.   .  Drug use: No  . Sexual activity: Never  Other Topics Concern  . Not on file  Social History Narrative   Retired   The patient is married and has one child and two grandchildren that live in the area.     She drinks wine with dinner.  She quit smoking in 1976, smoked for   approximately 10 years.      Moved to [place] from [place], [place]         Social Determinants of Health   Financial Resource Strain: Not on file  Food Insecurity: Not on file  Transportation Needs: Not on file  Physical Activity: Not on file  Stress: Not on file  Social Connections: Not on file  Intimate Partner Violence: Not on file    Past Surgical History:  Procedure Laterality Date  . ANKLE FRACTURE SURGERY Right 01/01/14   Has pins. Procedure performed at [facility]   "bilaterlly" (06/17/2012)  . COLONOSCOPY W/ POLYPECTOMY    . EYE SURGERY  2011   cataract removal both eyes  . HIP CLOSED REDUCTION  07/26/2012   Procedure: CLOSED MANIPULATION HIP;  Surgeon: Nita Sells, MD;  [facility];  Service: Orthopedics;  Laterality: Right;  . LUMBAR LAMINECTOMY  10/26/2017  . LUMBAR LAMINECTOMY/DECOMPRESSION MICRODISCECTOMY Bilateral 10/25/2017   Procedure: LUMBAR 2-3, LUMBAR 3-4 DECOMPRESSION TIME REQUESTED 4.5 HOURS;  Surgeon: Phylliss Bob, MD;  Location: [facility];  Service: Orthopedics;  Laterality: Bilateral;  . MASTECTOMY  1993?   bilateral mastectomy  . ORIF ANKLE FRACTURE Left 07/30/2017   Procedure: LEFT OPEN REDUCTION INTERNAL FIXATION (ORIF) ANKLE FRACTURE WITH SYDESMOTIC FIXATION;  Surgeon: Dorna Leitz, MD;  Location: [facility];  Service: Orthopedics;  Laterality: Left;  . TONSILLECTOMY  1949  . TOTAL HIP ARTHROPLASTY  04/2006   right hip   . TOTAL HIP REVISION  06/17/2012   "right" (06/17/2012)  . TOTAL HIP REVISION  06/17/2012   Procedure: TOTAL HIP REVISION;  Surgeon: Kerin Salen, MD;  Location: [facility];  Service: Orthopedics;  Laterality: Right;     Family History  Problem Relation Age of Onset  . Heart failure Father        deceased age 10  . Diabetes Father   . Alcohol abuse Father   . Breast cancer Mother        deceased at age 6 secondary to breast cancer  . Liver disease Sister        Liver failure--deceased  . Dementia Maternal Grandmother   . Colon cancer Neg Hx   . Esophageal cancer Neg Hx   . Rectal cancer Neg Hx   . Stomach cancer Neg Hx     Allergies  Allergen Reactions  . Amlodipine Swelling    Swelling   . Sulfonamide Derivatives Swelling    REACTION: Swelling of the face; "eyes swelled shut"  . Myrbetriq [Mirabegron]     Facial swelling, sob  . Chocolate Other (See Comments)    Sinus problems    Current Outpatient Medications on File Prior to Visit  Medication Sig Dispense Refill  . alendronate (FOSAMAX) 70 MG tablet Take 1 tablet (70 mg total) by mouth every 7 (seven) days. Take with a full glass of water on an empty stomach. 12 tablet 4  . bisoprolol (ZEBETA) 10 MG tablet Take 2 tablets (20 mg total) by mouth daily. 180 tablet 3  . famotidine (PEPCID) 20 MG tablet Take 1 tablet (20 mg total) by mouth 2 (two) times daily. 180 tablet 1  . fexofenadine (ALLEGRA) 180 MG tablet Take 180 mg by mouth daily as needed. For seasonal allergies    . FLUoxetine (PROZAC) 10 MG capsule Take 1 capsule (10 mg total) by mouth daily. 90 capsule 1  . fluticasone (FLONASE) 50 MCG/ACT nasal spray USE 1 SPRAY IN EACH NOSTRIL EVERY DAY 32 g 1  . furosemide (LASIX) 20 MG tablet TAKE 1 TABLET EVERY MORNING, TAKE 1 TABLET AFTER 3PM AND MAY TAKE 1 TABLET EVERY EVENING AS NEEDED FOR SWELLING. 270 tablet 0  . gabapentin (NEURONTIN) 100 MG capsule TAKE 1 CAPSULE BY MOUTH THREE TIMES A DAY 270 capsule 2  . hydrALAZINE (APRESOLINE) 25 MG tablet Take 1 tablet (25 mg total) by mouth in the morning and at bedtime. 180 tablet 1  . Lidocaine 4 % PTCH Apply 1 patch topically daily as needed (pain).    . Omega-3 Fatty Acids (FISH OIL)  1200 MG CAPS Take by mouth.    Vladimir Faster Glycol-Propyl Glycol (SYSTANE OP) Apply 1 drop to eye daily as needed (dry eyes).    . potassium chloride SA (KLOR-CON) 20 MEQ tablet Take 1 tablet (20 mEq total) by  mouth daily. 90 tablet 3  . simvastatin (ZOCOR) 10 MG tablet TAKE 1 TABLET EVERY DAY 90 tablet 0  . valsartan (DIOVAN) 320 MG tablet Take 1 tablet (320 mg total) by mouth daily. 90 tablet 3   No current facility-administered medications on file prior to visit.    BP (!) 151/69 (BP Location: Left Arm, Patient Position: Sitting, Cuff Size: Large)   Pulse 72   Temp 98.5 F (36.9 C) (Oral)   Resp 16   Ht 5\' 6"  (1.676 m)   Wt 246 lb (111.6 kg)   LMP 07/11/1991   SpO2 100%   BMI 39.71 kg/m       Objective:   Physical Exam Constitutional:      Appearance: She is well-developed and well-nourished.     Comments: Decreased facial swelling is noted today  Cardiovascular:     Rate and Rhythm: Normal rate and regular rhythm.     Heart sounds: Normal heart sounds. No murmur heard.     Comments: Bilateral LE edema 3-4+  Pulmonary:     Effort: Pulmonary effort is normal. No respiratory distress.     Breath sounds: Normal breath sounds. No wheezing.  Psychiatric:        Mood and Affect: Mood and affect normal.        Behavior: Behavior normal.        Thought Content: Thought content normal.        Judgment: Judgment normal.           Assessment & Plan:  Acute CHF exacerbation- she seems to be doing better on the 3 times daily lasix 20mg  regimen. Will continue at this dose until she sees cardiology.  OSA- not using bipap. She is encouraged to restart.  Hyperkalemia- check follow up bmet- she continues to hold potassium.  This visit occurred during the SARS-CoV-2 public health emergency.  Safety protocols were in place, including screening questions prior to the visit, additional usage of staff PPE, and extensive cleaning of exam room while observing appropriate contact time  as indicated for disinfecting solutions.

## 2020-08-17 NOTE — Patient Instructions (Signed)
Please continue lasix 3 times daily.

## 2020-08-18 ENCOUNTER — Other Ambulatory Visit: Payer: Self-pay | Admitting: Family

## 2020-08-18 LAB — BASIC METABOLIC PANEL
BUN: 39 mg/dL — ABNORMAL HIGH (ref 6–23)
CO2: 25 mEq/L (ref 19–32)
Calcium: 10.5 mg/dL (ref 8.4–10.5)
Chloride: 103 mEq/L (ref 96–112)
Creatinine, Ser: 1.64 mg/dL — ABNORMAL HIGH (ref 0.40–1.20)
GFR: 30.03 mL/min — ABNORMAL LOW (ref >60.00)
Glucose, Bld: 81 mg/dL (ref 70–99)
Potassium: 4.5 mEq/L (ref 3.5–5.1)
Sodium: 139 mEq/L (ref 135–145)

## 2020-08-19 DIAGNOSIS — M62552 Muscle wasting and atrophy, not elsewhere classified, left thigh: Secondary | ICD-10-CM | POA: Diagnosis not present

## 2020-08-19 DIAGNOSIS — M62551 Muscle wasting and atrophy, not elsewhere classified, right thigh: Secondary | ICD-10-CM | POA: Diagnosis not present

## 2020-08-19 DIAGNOSIS — M4003 Postural kyphosis, cervicothoracic region: Secondary | ICD-10-CM | POA: Diagnosis not present

## 2020-08-19 DIAGNOSIS — R2689 Other abnormalities of gait and mobility: Secondary | ICD-10-CM | POA: Diagnosis not present

## 2020-08-19 DIAGNOSIS — M4005 Postural kyphosis, thoracolumbar region: Secondary | ICD-10-CM | POA: Diagnosis not present

## 2020-08-19 DIAGNOSIS — M62561 Muscle wasting and atrophy, not elsewhere classified, right lower leg: Secondary | ICD-10-CM | POA: Diagnosis not present

## 2020-08-23 DIAGNOSIS — R2689 Other abnormalities of gait and mobility: Secondary | ICD-10-CM | POA: Diagnosis not present

## 2020-08-23 DIAGNOSIS — M62561 Muscle wasting and atrophy, not elsewhere classified, right lower leg: Secondary | ICD-10-CM | POA: Diagnosis not present

## 2020-08-23 DIAGNOSIS — M62552 Muscle wasting and atrophy, not elsewhere classified, left thigh: Secondary | ICD-10-CM | POA: Diagnosis not present

## 2020-08-23 DIAGNOSIS — M4005 Postural kyphosis, thoracolumbar region: Secondary | ICD-10-CM | POA: Diagnosis not present

## 2020-08-23 DIAGNOSIS — M4003 Postural kyphosis, cervicothoracic region: Secondary | ICD-10-CM | POA: Diagnosis not present

## 2020-08-23 DIAGNOSIS — M62551 Muscle wasting and atrophy, not elsewhere classified, right thigh: Secondary | ICD-10-CM | POA: Diagnosis not present

## 2020-08-25 DIAGNOSIS — M62551 Muscle wasting and atrophy, not elsewhere classified, right thigh: Secondary | ICD-10-CM | POA: Diagnosis not present

## 2020-08-25 DIAGNOSIS — M62552 Muscle wasting and atrophy, not elsewhere classified, left thigh: Secondary | ICD-10-CM | POA: Diagnosis not present

## 2020-08-25 DIAGNOSIS — R2689 Other abnormalities of gait and mobility: Secondary | ICD-10-CM | POA: Diagnosis not present

## 2020-08-25 DIAGNOSIS — M4003 Postural kyphosis, cervicothoracic region: Secondary | ICD-10-CM | POA: Diagnosis not present

## 2020-08-25 DIAGNOSIS — M4005 Postural kyphosis, thoracolumbar region: Secondary | ICD-10-CM | POA: Diagnosis not present

## 2020-08-25 DIAGNOSIS — M62561 Muscle wasting and atrophy, not elsewhere classified, right lower leg: Secondary | ICD-10-CM | POA: Diagnosis not present

## 2020-09-02 ENCOUNTER — Other Ambulatory Visit: Payer: Self-pay | Admitting: Family

## 2020-09-06 ENCOUNTER — Ambulatory Visit (HOSPITAL_BASED_OUTPATIENT_CLINIC_OR_DEPARTMENT_OTHER)
Admission: RE | Admit: 2020-09-06 | Discharge: 2020-09-06 | Disposition: A | Discharge status: Home / Self Care | Payer: Medicare Other | Source: Ambulatory Visit | Attending: Family | Admitting: Family

## 2020-09-06 ENCOUNTER — Other Ambulatory Visit: Payer: Self-pay

## 2020-09-06 DIAGNOSIS — R0600 Dyspnea, unspecified: Secondary | ICD-10-CM | POA: Diagnosis not present

## 2020-09-06 DIAGNOSIS — R0609 Other forms of dyspnea: Secondary | ICD-10-CM

## 2020-09-06 LAB — ECHOCARDIOGRAM COMPLETE
Area-P 1/2: 4.96 cm2
S' Lateral: 3.65 cm

## 2020-09-07 ENCOUNTER — Encounter: Payer: Self-pay | Admitting: Family

## 2020-09-24 ENCOUNTER — Ambulatory Visit: Payer: Medicare Other | Admitting: Physician Assistant

## 2020-10-06 ENCOUNTER — Ambulatory Visit (INDEPENDENT_AMBULATORY_CARE_PROVIDER_SITE_OTHER): Payer: Medicare Other | Admitting: Physician Assistant

## 2020-10-06 ENCOUNTER — Telehealth: Payer: Self-pay | Admitting: Adult Health

## 2020-10-06 ENCOUNTER — Encounter: Payer: Self-pay | Admitting: Physician Assistant

## 2020-10-06 ENCOUNTER — Other Ambulatory Visit: Payer: Self-pay

## 2020-10-06 VITALS — BP 148/80 | HR 68 | Ht 66.0 in | Wt 246.4 lb

## 2020-10-06 DIAGNOSIS — I10 Essential (primary) hypertension: Secondary | ICD-10-CM

## 2020-10-06 DIAGNOSIS — R600 Localized edema: Secondary | ICD-10-CM

## 2020-10-06 DIAGNOSIS — G4733 Obstructive sleep apnea (adult) (pediatric): Secondary | ICD-10-CM | POA: Diagnosis not present

## 2020-10-06 LAB — BASIC METABOLIC PANEL
BUN/Creatinine Ratio: 22 (ref 12–28)
BUN: 38 mg/dL — ABNORMAL HIGH (ref 8–27)
CO2: 20 mmol/L (ref 20–29)
Calcium: 10.6 mg/dL — ABNORMAL HIGH (ref 8.7–10.3)
Chloride: 102 mmol/L (ref 96–106)
Creatinine, Ser: 1.72 mg/dL — ABNORMAL HIGH (ref 0.57–1.00)
Glucose: 104 mg/dL — ABNORMAL HIGH (ref 65–99)
Potassium: 5.2 mmol/L (ref 3.5–5.2)
Sodium: 139 mmol/L (ref 134–144)
eGFR: 30 mL/min/{1.73_m2} — ABNORMAL LOW (ref >59)

## 2020-10-06 MED ORDER — FUROSEMIDE 40 MG PO TABS: 40.0000 mg | ORAL_TABLET | Freq: Two times a day (BID) | ORAL | 3 refills | Status: DC

## 2020-10-06 NOTE — Telephone Encounter (Signed)
3.30.22 LM on cell phone to offer earlier appoint on 10/29/20 w/PA Jory Sims. LP

## 2020-10-06 NOTE — Patient Instructions (Addendum)
Medication Instructions:   INCREASE Lasix to 40 mg 2 times a day   *If you need a refill on your cardiac medications before your next appointment, please call your pharmacy*  Lab Work: Your physician recommends that you return for lab work TODAY:   BMET  Your physician recommends that you return for lab work in 1 week :   BMET   If you have labs (blood work) drawn today and your tests are completely normal, you will receive your results only by: Marland Kitchen MyChart Message (if you have MyChart) OR . A paper copy in the mail If you have any lab test that is abnormal or we need to change your treatment, we will call you to review the results.   Testing/Procedures: NONE ordered at this time of appointment   Follow-Up: At [facility], you and your health needs are our priority.  As part of our continuing mission to provide you with exceptional heart care, we have created designated Provider Care Teams.  These Care Teams include your primary Cardiologist (physician) and Advanced Practice Providers (APPs -  Physician Assistants and Nurse Practitioners) who all work together to provide you with the care you need, when you need it.  Your next appointment:   3 week(s)  The format for your next appointment:   In Person  Provider:   Raliegh Ip Mali Hilty, MD or APP  Other Instructions 1. Salt restriction 2. Fluid restriction (keep daily intake to between 32 - 64 oz) 3. Daily weight diary (bring diary to the next visit)  Ace bandage wrap of legs. Leg elevation to help with edema.

## 2020-10-06 NOTE — Progress Notes (Signed)
Cardiology Office Note:    Date:  10/07/2020   ID:  [name], DOB August 27, 1940, MRN [id number]  PCP:  Debbrah Alar, NP   [place]  Cardiologist:  Pixie Casino, MD  Advanced Practice Provider:  No care team member to display Electrophysiologist:  None   Referring MD: Debbrah Alar, NP   Chief Complaint  Patient presents with  . Follow-up    Seen for Dr. Debara Pickett    History of Present Illness:    [name] is a 80 y.o. female with a hx of obstructive sleep apnea on BiPAP therapy, hypertension and history of congestive heart failure.  She had an episode of congestive heart failure in January 2016 when she presented with upper and lower extremity anasarca.  Her amlodipine was discontinued at that time and she was started on Lasix.  Echocardiogram showed normal systolic and diastolic function.  She had a significant diuresis.  It was not clear what initiated the heart failure episode.  Due to palpitation, she was placed on a heart monitor that showed PACs, PVCs and an transient episode of SVT.  Myoview obtained on 12/01/2015 showed EF 64%, no ischemia identified, overall low risk study.  Patient was last seen by Dr. Debara Pickett on 04/05/2020 at which time she was tolerating the new valsartan 320 mg daily dosing.  Blood pressure has improved.  Recently, patient was seen by Dr. Inda Castle on 08/11/2020 for worsening shortness of breath.  BNP was elevated.  Her Lasix was increased to 20 mg 3 times daily dosing.  Follow-up lab work shows hyperkalemia, potassium supplement was discontinued.  It was also noted the patient was noncompliant with her BiPAP therapy at home.  It was encouraged for her to resume BiPAP therapy.  Patient underwent repeat echocardiogram ordered by her PCP on 09/06/2020, this revealed EF 60 to 65%, grade 2 DD, RVSP 59.9 mmHg, trivial MR.  Patient presents today for follow-up.  She continues to have dyspnea on exertion and lower  extremity edema.  Based on how large her legs is, I would not be too surprised if she has some degree of lymphedema, however she does have at least 2+ pitting edema on exam as well.  I recommend to challenge herself with higher dose of diuretic at this time.  I will increase her Lasix to 40 mg twice daily.  She has been self dosing herself with a 10 meq daily of the potassium.  I think would be reasonable to restart this on a daily basis.  I reviewed the recent echocardiogram result with her.  We discussed the importance of salt restriction, fluid restriction, and daily weight monitoring.  I also recommended leg elevation and Ace wrap of her leg to try to compress and reduce the amount of fluid.  Otherwise, I plan to bring her back in 3 weeks for follow-up.  She has only partial compliance with BiPAP therapy, I recommend that she need to start using BiPAP therapy every day.  Her blood pressure is borderline elevated today, given the need for further diuresis, I decided to hold off on adjusting her blood pressure medication today and reevaluate on follow-up.    Past Medical History:  Diagnosis Date  . Allergic rhinitis   . Ankle fracture    Left, Mortise Widening  . Arthritis    "knuckles" (06/17/2012)  . 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 Breast cancer ([facility])    "right" (06/17/2012)  . Cataracts, bilateral   . Dyspnea  with exertion  . Exertional dyspnea   . GERD (gastroesophageal reflux disease)   . Hypertension   . Iron deficiency anemia    "hematologist watches it" (06/17/2012)  . Obesity   . OSA treated with BiPAP 03/08/2016  . Osteoarthritis    severe right hip osteoarthritis-s/p hip replacement  . Osteopenia 11/26/2009  . Thyroid disorder    "sees endocrinologist yearly" (06/17/2012)    Past Surgical History:  Procedure Laterality Date  . ANKLE FRACTURE SURGERY Right 01/01/14   Has pins. Procedure performed at [facility]   "bilaterlly"  (06/17/2012)  . COLONOSCOPY W/ POLYPECTOMY    . EYE SURGERY  2011   cataract removal both eyes  . HIP CLOSED REDUCTION  07/26/2012   Procedure: CLOSED MANIPULATION HIP;  Surgeon: Nita Sells, MD;  Location: [facility];  Service: Orthopedics;  Laterality: Right;  . LUMBAR LAMINECTOMY  10/26/2017  . LUMBAR LAMINECTOMY/DECOMPRESSION MICRODISCECTOMY Bilateral 10/25/2017   Procedure: LUMBAR 2-3, LUMBAR 3-4 DECOMPRESSION TIME REQUESTED 4.5 HOURS;  Surgeon: Phylliss Bob, MD;  Location: [facility];  Service: Orthopedics;  Laterality: Bilateral;  . MASTECTOMY  1993?   bilateral mastectomy  . ORIF ANKLE FRACTURE Left 07/30/2017   Procedure: LEFT OPEN REDUCTION INTERNAL FIXATION (ORIF) ANKLE FRACTURE WITH SYDESMOTIC FIXATION;  Surgeon: Dorna Leitz, MD;  Location: [facility];  Service: Orthopedics;  Laterality: Left;  . TONSILLECTOMY  1949  . TOTAL HIP ARTHROPLASTY  04/2006   right hip   . TOTAL HIP REVISION  06/17/2012   "right" (06/17/2012)  . TOTAL HIP REVISION  06/17/2012   Procedure: TOTAL HIP REVISION;  Surgeon: Kerin Salen, MD;  Location: [facility];  Service: Orthopedics;  Laterality: Right;    Current Medications: Current Meds  Medication Sig  . acetaminophen (TYLENOL) 500 MG tablet Take 1,000 mg by mouth. Take with Gabapetin  . alendronate (FOSAMAX) 70 MG tablet Take 1 tablet (70 mg total) by mouth every 7 (seven) days. Take with a full glass of water on an empty stomach.  . bisoprolol (ZEBETA) 10 MG tablet Take 2 tablets (20 mg total) by mouth daily.  . famotidine (PEPCID) 20 MG tablet Take 1 tablet (20 mg total) by mouth 2 (two) times daily.  . fexofenadine (ALLEGRA) 180 MG tablet Take 180 mg by mouth daily as needed. For seasonal allergies  . FLUoxetine (PROZAC) 10 MG capsule Take 1 capsule (10 mg total) by mouth daily.  . fluticasone (FLONASE) 50 MCG/ACT nasal spray USE 1 SPRAY IN EACH NOSTRIL EVERY DAY  . gabapentin (NEURONTIN) 100 MG capsule TAKE 1 CAPSULE BY MOUTH THREE TIMES A DAY  .  hydrALAZINE (APRESOLINE) 25 MG tablet Take 1 tablet (25 mg total) by mouth in the morning and at bedtime.  . Lidocaine 4 % PTCH Apply 1 patch topically daily as needed (pain).  . Omega-3 Fatty Acids (FISH OIL) 1200 MG CAPS Take by mouth.  Vladimir Faster Glycol-Propyl Glycol (SYSTANE OP) Apply 1 drop to eye daily as needed (dry eyes).  . potassium chloride (KLOR-CON) 10 MEQ tablet Take 10 mEq by mouth daily.  . simvastatin (ZOCOR) 10 MG tablet Take 1 tablet (10 mg total) by mouth daily.  . valsartan (DIOVAN) 320 MG tablet Take 1 tablet (320 mg total) by mouth daily.  . [DISCONTINUED] furosemide (LASIX) 20 MG tablet TAKE 1 TABLET EVERY MORNING, TAKE 1 TABLET AFTER 3PM AND MAY TAKE 1 TABLET EVERY EVENING AS NEEDED FOR SWELLING.     Allergies:  Amlodipine, Sulfonamide derivatives, Myrbetriq [mirabegron], and Chocolate   Social History   Socioeconomic History  . Marital status: Widowed    Spouse name: Not on file  . Number of children: Not on file  . Years of education: Not on file  . Highest education level: Not on file  Occupational History  . Not on file  Tobacco Use  . Smoking status: Former Smoker    Packs/day: 0.50    Years: 10.00    Pack years: 5.00    Types: Cigarettes    Quit date: 07/14/1974    Years since quitting: 46.2  . Smokeless tobacco: Never Used  . Tobacco comment: quit in 1976 smoked for approx. 10 years  Vaping Use  . Vaping Use: Never used  Substance and Sexual Activity  . Alcohol use: Yes    Comment: wine on occasion. rare.   . Drug use: No  . Sexual activity: Never  Other Topics Concern  . Not on file  Social History Narrative   Retired   The patient is married and has one child and two grandchildren that live in the area.     She drinks wine with dinner.  She quit smoking in 1976, smoked for   approximately 10 years.      Moved to [place] from [place], [place]         Social Determinants of Health   Financial Resource Strain: Not on file  Food  Insecurity: Not on file  Transportation Needs: Not on file  Physical Activity: Not on file  Stress: Not on file  Social Connections: Not on file     Family History: The patient's family history includes Alcohol abuse in her father; Breast cancer in her mother; Dementia in her maternal grandmother; Diabetes in her father; Heart failure in her father; Liver disease in her sister. There is no history of Colon cancer, Esophageal cancer, Rectal cancer, or Stomach cancer.  ROS:   Please see the history of present illness.     All other systems reviewed and are negative.  EKGs/Labs/Other Studies Reviewed:    The following studies were reviewed today:  Myoview 12/01/2015  The left ventricular ejection fraction is normal (55-65%).  Nuclear stress EF: 64%. No wall motion abnormalities  There was no ST segment deviation noted during stress. Bowel loop artifact noted during stress.  This is a low risk study. No ischemia identified.     Echo 09/06/2020 1. Left ventricular ejection fraction, by estimation, is 60 to 65%. The  left ventricle has normal function. The left ventricle has no regional  wall motion abnormalities. Left ventricular diastolic parameters are  consistent with Grade II diastolic  dysfunction (pseudonormalization). Elevated left atrial pressure.  2. Right ventricular systolic function is normal. The right ventricular  size is normal. There is moderately elevated pulmonary artery systolic  pressure. The estimated right ventricular systolic pressure is 46.9 mmHg.  3. Left atrial size was mildly dilated.  4. The mitral valve is normal in structure. Trivial mitral valve  regurgitation. No evidence of mitral stenosis.  5. Tricuspid valve regurgitation is moderate.  6. The aortic valve is tricuspid. Aortic valve regurgitation is not  visualized. No aortic stenosis is present.  7. The inferior vena cava is normal in size with greater than 50%  respiratory variability,  suggesting right atrial pressure of 3 mmHg.    EKG:  EKG is not ordered today.   Recent Labs: 08/11/2020: ALT 9; Hemoglobin 10.3; Platelets 279.0; Pro B Natriuretic peptide (  BNP) 587.0 10/06/2020: BUN 38; Creatinine, Ser 1.72; Potassium 5.2; Sodium 139  Recent Lipid Panel    Component Value Date/Time   CHOL 167 05/04/2020 1402   TRIG 94 05/04/2020 1402   HDL 79 05/04/2020 1402   CHOLHDL 2.1 05/04/2020 1402   VLDL 14.2 12/25/2018 0854   LDLCALC 70 05/04/2020 1402   LDLDIRECT 110.4 12/13/2006 0900     Risk Assessment/Calculations:       Physical Exam:    VS:  BP (!) 148/80   Pulse 68   Ht 5\' 6"  (1.676 m)   Wt 246 lb 6.4 oz (111.8 kg)   LMP 07/11/1991   SpO2 97%   BMI 39.77 kg/m     Wt Readings from Last 3 Encounters:  10/06/20 246 lb 6.4 oz (111.8 kg)  08/17/20 246 lb (111.6 kg)  08/11/20 247 lb (112 kg)     GEN:  Well nourished, well developed in no acute distress HEENT: Normal NECK: No JVD; No carotid bruits LYMPHATICS: No lymphadenopathy CARDIAC: RRR, no murmurs, rubs, gallops RESPIRATORY:  Clear to auscultation without rales, wheezing or rhonchi  ABDOMEN: Soft, non-tender, non-distended MUSCULOSKELETAL:  2+ edema; No deformity  SKIN: Warm and dry NEUROLOGIC:  Alert and oriented x 3 PSYCHIATRIC:  Normal affect   ASSESSMENT:    1. Leg edema   2. Essential hypertension   3. OSA treated with BiPAP    PLAN:    In order of problems listed above:  1. Leg edema: Patient has at least 2+ lower extremity edema bilaterally.  I suspect she may have a component of lymphedema, however I would recommend challenge herself with higher dose of diuretic.  She will increase Lasix to 40 mg daily twice daily.  She will also restart potassium chloride 10 mEq daily.  Obtain basic metabolic panel today and also in 1 week.  I also recommended Ace bandage wrap of the lower extremity.  2. Hypertension: Blood pressure mildly elevated, if blood pressure remains elevated after  diuresis, will consider titration of blood pressure medication.  3. Obstructive sleep apnea: On BiPAP therapy.        Medication Adjustments/Labs and Tests Ordered: Current medicines are reviewed at length with the patient today.  Concerns regarding medicines are outlined above.  Orders Placed This Encounter  Procedures  . Basic metabolic panel  . Basic metabolic panel   Meds ordered this encounter  Medications  . furosemide (LASIX) 40 MG tablet    Sig: Take 1 tablet (40 mg total) by mouth 2 (two) times daily.    Dispense:  180 tablet    Refill:  3    New Rx, dose change    Patient Instructions  Medication Instructions:   INCREASE Lasix to 40 mg 2 times a day   *If you need a refill on your cardiac medications before your next appointment, please call your pharmacy*  Lab Work: Your physician recommends that you return for lab work TODAY:   BMET  Your physician recommends that you return for lab work in 1 week :   BMET   If you have labs (blood work) drawn today and your tests are completely normal, you will receive your results only by: Marland Kitchen MyChart Message (if you have MyChart) OR . A paper copy in the mail If you have any lab test that is abnormal or we need to change your treatment, we will call you to review the results.   Testing/Procedures: NONE ordered at this time of appointment  Follow-Up: At [facility], you and your health needs are our priority.  As part of our continuing mission to provide you with exceptional heart care, we have created designated Provider Care Teams.  These Care Teams include your primary Cardiologist (physician) and Advanced Practice Providers (APPs -  Physician Assistants and Nurse Practitioners) who all work together to provide you with the care you need, when you need it.  Your next appointment:   3 week(s)  The format for your next appointment:   In Person  Provider:   Raliegh Ip Mali Hilty, MD or APP  Other Instructions 1.  Salt restriction 2. Fluid restriction (keep daily intake to between 32 - 64 oz) 3. Daily weight diary (bring diary to the next visit)  Ace bandage wrap of legs. Leg elevation to help with edema.     Hilbert Corrigan, [place]  10/07/2020 6:54 PM    [facility]

## 2020-10-07 ENCOUNTER — Encounter: Payer: Self-pay | Admitting: Physician Assistant

## 2020-10-13 ENCOUNTER — Other Ambulatory Visit: Payer: Self-pay

## 2020-10-13 DIAGNOSIS — R600 Localized edema: Secondary | ICD-10-CM | POA: Diagnosis not present

## 2020-10-13 LAB — BASIC METABOLIC PANEL
BUN/Creatinine Ratio: 26 (ref 12–28)
BUN: 43 mg/dL — ABNORMAL HIGH (ref 8–27)
CO2: 17 mmol/L — ABNORMAL LOW (ref 20–29)
Calcium: 10.7 mg/dL — ABNORMAL HIGH (ref 8.7–10.3)
Chloride: 99 mmol/L (ref 96–106)
Creatinine, Ser: 1.64 mg/dL — ABNORMAL HIGH (ref 0.57–1.00)
Glucose: 105 mg/dL — ABNORMAL HIGH (ref 65–99)
Potassium: 4.5 mmol/L (ref 3.5–5.2)
Sodium: 136 mmol/L (ref 134–144)
eGFR: 32 mL/min/{1.73_m2} — ABNORMAL LOW (ref >59)

## 2020-10-25 ENCOUNTER — Other Ambulatory Visit: Payer: Self-pay | Admitting: Family

## 2020-10-27 NOTE — Progress Notes (Signed)
Cardiology Office Note   Date:  10/29/2020   ID:  [name], DOB June 05, 1941, MRN [id number]  PCP:  Debbrah Alar, NP  Cardiologist: Dr. Debara Pickett  CC: Follow Up Edema    History of Present Illness: [name] is a 80 y.o. female who presents for ongoing assessment and management of hypertension and CHF, with history of OSA on BiPAP therapy.  She was hospitalized in 2016 with an episode of congestive heart failure and anasarca.  Her amlodipine was discontinued and she was started on Lasix.  Echocardiogram was completed and it showed normal systolic and diastolic function.  It was on certain why she had the heart failure episode.  A follow-up Myoview was obtained on 12/01/2015 which showed an EF of 64% and no ischemia was identified and therefore was an overall low risk study.  She was seen last in the office on 10/06/2020 by Almyra Deforest, PA, in the setting of dyspnea on exertion and lower extremity edema.  It was thought that some of this edema was related to lymphedema.  She was given higher dose of Lasix to 40 mg twice daily and placed on potassium 10 mEq daily.  Reeducation concerning salt restriction and fluid restriction along with daily weights was completed.  It was also documented that she only had partial compliance with BiPAP therapy and therefore it was recommended that she needed to start using BiPAP every day.  No other medications were changed at that time and she is here for follow-up to evaluate her response to medications.  She has lost 10 pounds and had significant improvement and her dependent edema.  She continues to have issues with her left leg and has been using ACE  wraps which has been improving the edema and decreasing pain.  She denies any cramping.  She is watching her salt intake.  Past Medical History:  Diagnosis Date  . Allergic rhinitis   . Ankle fracture    Left, Mortise Widening  . Arthritis    "knuckles" (06/17/2012)  . Borderline diabetes mellitus  03/06/2011  . Breast cancer ([facility])    "right" (06/17/2012)  . Cataracts, bilateral   . Dyspnea    with exertion  . Exertional dyspnea   . GERD (gastroesophageal reflux disease)   . Hypertension   . Iron deficiency anemia    "hematologist watches it" (06/17/2012)  . Obesity   . OSA treated with BiPAP 03/08/2016  . Osteoarthritis    severe right hip osteoarthritis-s/p hip replacement  . Osteopenia 11/26/2009  . Thyroid disorder    "sees endocrinologist yearly" (06/17/2012)    Past Surgical History:  Procedure Laterality Date  . ANKLE FRACTURE SURGERY Right 01/01/14   Has pins. Procedure performed at [facility]   "bilaterlly" (06/17/2012)  . COLONOSCOPY W/ POLYPECTOMY    . EYE SURGERY  2011   cataract removal both eyes  . HIP CLOSED REDUCTION  07/26/2012   Procedure: CLOSED MANIPULATION HIP;  Surgeon: Nita Sells, MD;  [facility];  Service: Orthopedics;  Laterality: Right;  . LUMBAR LAMINECTOMY  10/26/2017  . LUMBAR LAMINECTOMY/DECOMPRESSION MICRODISCECTOMY Bilateral 10/25/2017   Procedure: LUMBAR 2-3, LUMBAR 3-4 DECOMPRESSION TIME REQUESTED 4.5 HOURS;  Surgeon: Phylliss Bob, MD;  Location: [facility];  Service: Orthopedics;  Laterality: Bilateral;  . MASTECTOMY  1993?   bilateral mastectomy  . ORIF ANKLE FRACTURE Left 07/30/2017   Procedure: LEFT OPEN REDUCTION INTERNAL FIXATION (ORIF) ANKLE FRACTURE WITH SYDESMOTIC FIXATION;  Surgeon: Dorna Leitz,  MD;  Location: [facility];  Service: Orthopedics;  Laterality: Left;  . TONSILLECTOMY  1949  . TOTAL HIP ARTHROPLASTY  04/2006   right hip   . TOTAL HIP REVISION  06/17/2012   "right" (06/17/2012)  . TOTAL HIP REVISION  06/17/2012   Procedure: TOTAL HIP REVISION;  Surgeon: Kerin Salen, MD;  Location: [facility];  Service: Orthopedics;  Laterality: Right;     Current Outpatient Medications  Medication Sig Dispense Refill  . acetaminophen (TYLENOL) 500 MG tablet Take 1,000 mg by mouth. Take with  Gabapetin    . alendronate (FOSAMAX) 70 MG tablet Take 1 tablet (70 mg total) by mouth every 7 (seven) days. Take with a full glass of water on an empty stomach. 12 tablet 4  . bisoprolol (ZEBETA) 10 MG tablet Take 2 tablets (20 mg total) by mouth daily. 180 tablet 3  . famotidine (PEPCID) 20 MG tablet Take 1 tablet (20 mg total) by mouth 2 (two) times daily. 180 tablet 1  . fexofenadine (ALLEGRA) 180 MG tablet Take 180 mg by mouth daily as needed. For seasonal allergies    . FLUoxetine (PROZAC) 10 MG capsule Take 1 capsule (10 mg total) by mouth daily. 90 capsule 1  . fluticasone (FLONASE) 50 MCG/ACT nasal spray USE 1 SPRAY IN EACH NOSTRIL EVERY DAY 32 g 1  . furosemide (LASIX) 40 MG tablet Take 1 tablet (40 mg total) by mouth 2 (two) times daily. 180 tablet 3  . gabapentin (NEURONTIN) 100 MG capsule TAKE 1 CAPSULE BY MOUTH THREE TIMES A DAY 270 capsule 2  . hydrALAZINE (APRESOLINE) 25 MG tablet Take 1 tablet (25 mg total) by mouth in the morning and at bedtime. 180 tablet 1  . Lidocaine 4 % PTCH Apply 1 patch topically daily as needed (pain).    . Omega-3 Fatty Acids (FISH OIL) 1200 MG CAPS Take by mouth.    Vladimir Faster Glycol-Propyl Glycol (SYSTANE OP) Apply 1 drop to eye daily as needed (dry eyes).    . potassium chloride (KLOR-CON) 10 MEQ tablet Take 10 mEq by mouth daily.    . simvastatin (ZOCOR) 10 MG tablet Take 1 tablet (10 mg total) by mouth daily. 90 tablet 1  . valsartan (DIOVAN) 320 MG tablet Take 1 tablet (320 mg total) by mouth daily. 90 tablet 3   No current facility-administered medications for this visit.    Allergies:   Amlodipine, Sulfonamide derivatives, Myrbetriq [mirabegron], and Chocolate    Social History:  The patient  reports that she quit smoking about 46 years ago. Her smoking use included cigarettes. She has a 5.00 pack-year smoking history. She has never used smokeless tobacco. She reports current alcohol use. She reports that she does not use drugs.   Family  History:  The patient's family history includes Alcohol abuse in her father; Breast cancer in her mother; Dementia in her maternal grandmother; Diabetes in her father; Heart failure in her father; Liver disease in her sister.    ROS: All other systems are reviewed and negative. Unless otherwise mentioned in H&P    PHYSICAL EXAM: VS:  BP 138/77   Pulse 70   Ht 5\' 6"  (1.676 m)   Wt 235 lb 12.8 oz (107 kg)   LMP 07/11/1991   SpO2 98%   BMI 38.06 kg/m  , BMI Body mass index is 38.06 kg/m. GEN: Well nourished, well developed, in no acute distress, obese HEENT: normal Neck: no JVD, carotid bruits, or masses Cardiac: RRR; no murmurs,  rubs, or gallops,no edema  Respiratory:  Clear to auscultation bilaterally, normal work of breathing GI: soft, nontender, nondistended, + BS MS: no deformity or atrophy Skin: warm and dry, no rash, bilateral venous stasis dermatitis noted in the LE.  Neuro:  Strength and sensation are intact Psych: euthymic mood, full affect   EKG:  Not completed this office visit.    Recent Labs: 08/11/2020: ALT 9; Hemoglobin 10.3; Platelets 279.0; Pro B Natriuretic peptide (BNP) 587.0 10/13/2020: BUN 43; Creatinine, Ser 1.64; Potassium 4.5; Sodium 136    Lipid Panel    Component Value Date/Time   CHOL 167 05/04/2020 1402   TRIG 94 05/04/2020 1402   HDL 79 05/04/2020 1402   CHOLHDL 2.1 05/04/2020 1402   VLDL 14.2 12/25/2018 0854   LDLCALC 70 05/04/2020 1402   LDLDIRECT 110.4 12/13/2006 0900      Wt Readings from Last 3 Encounters:  10/29/20 235 lb 12.8 oz (107 kg)  10/06/20 246 lb 6.4 oz (111.8 kg)  08/17/20 246 lb (111.6 kg)      Other studies Reviewed: Echocardiogram September 14, 2020 1. Left ventricular ejection fraction, by estimation, is 60 to 65%. The  left ventricle has normal function. The left ventricle has no regional  wall motion abnormalities. Left ventricular diastolic parameters are  consistent with Grade II diastolic  dysfunction  (pseudonormalization). Elevated left atrial pressure.  2. Right ventricular systolic function is normal. The right ventricular  size is normal. There is moderately elevated pulmonary artery systolic  pressure. The estimated right ventricular systolic pressure is 96.7 mmHg.  3. Left atrial size was mildly dilated.  4. The mitral valve is normal in structure. Trivial mitral valve  regurgitation. No evidence of mitral stenosis.  5. Tricuspid valve regurgitation is moderate.  6. The aortic valve is tricuspid. Aortic valve regurgitation is not  visualized. No aortic stenosis is present.  7. The inferior vena cava is normal in size with greater than 50%  respiratory variability, suggesting right atrial pressure of 3 mmHg.    ASSESSMENT AND PLAN:  1.  Chronic Diastolic CHF: She has done well with increased dose of Lasix to 40 mg.  She has lost 10 pounds.  Lower extremity edema has significantly improved.  She continues to use ACE  wraps on her lower left leg.  Venous stasis dermatitis is noted.  We have discussed weight gain of 2 to 3 pounds, or 5 pounds in a week and therefore she will need to take an additional dose of Lasix 20 mg to have her weight come back down.  She states she is breathing better and is able to walk better without the heaviness in her legs.  No changes.  2.  Hypertension: Blood pressure is well controlled.  She will not have any new changes in medications, continue with bisoprolol 20 mg, valsartan 320 mg daily, hydralazine, and Lasix 40 mg twice daily, along with potassium.  I have asked her to take an additional dose of potassium should she have to take an additional 20 mg of Lasix..   Current medicines are reviewed at length with the patient today.  I have spent 25 minutes  dedicated to the care of this patient on the date of this encounter to include pre-visit review of records, assessment, management and diagnostic testing,with shared decision making.  Labs/ tests  ordered today include: None Bellissimo [name]. West Pugh, ANP, [facility]   10/29/2020 11:25 AM    [facility] [place] [address] Office [phone]  Fax [phone]  Notice: This dictation was prepared with Dragon dictation along with smaller phrase technology. Any transcriptional errors that result from this process are unintentional and may not be corrected upon review.

## 2020-10-29 ENCOUNTER — Ambulatory Visit (INDEPENDENT_AMBULATORY_CARE_PROVIDER_SITE_OTHER): Payer: Medicare Other | Admitting: Adult Health

## 2020-10-29 ENCOUNTER — Encounter: Payer: Self-pay | Admitting: Adult Health

## 2020-10-29 ENCOUNTER — Other Ambulatory Visit: Payer: Self-pay

## 2020-10-29 VITALS — BP 138/77 | HR 70 | Ht 66.0 in | Wt 235.8 lb

## 2020-10-29 DIAGNOSIS — I5032 Chronic diastolic (congestive) heart failure: Secondary | ICD-10-CM

## 2020-10-29 DIAGNOSIS — I10 Essential (primary) hypertension: Secondary | ICD-10-CM | POA: Diagnosis not present

## 2020-10-29 NOTE — Patient Instructions (Signed)
Medication Instructions:  Continue current medications  *If you need a refill on your cardiac medications before your next appointment, please call your pharmacy*   Lab Work: None Ordered   Testing/Procedures: None ordered   Follow-Up: At [facility], you and your health needs are our priority.  As part of our continuing mission to provide you with exceptional heart care, we have created designated Provider Care Teams.  These Care Teams include your primary Cardiologist (physician) and Advanced Practice Providers (APPs -  Physician Assistants and Nurse Practitioners) who all work together to provide you with the care you need, when you need it.  We recommend signing up for the patient portal called "MyChart".  Sign up information is provided on this After Visit Summary.  MyChart is used to connect with patients for Virtual Visits (Telemedicine).  Patients are able to view lab/test results, encounter notes, upcoming appointments, etc.  Non-urgent messages can be sent to your provider as well.   To learn more about what you can do with MyChart, go to [email or web address]://[email or web address].com.    Your next appointment:   6 month(s)  The format for your next appointment:   In Person  Provider:   You may see Kenneth C Hilty, MD or one of the following Advanced Practice Providers on your designated Care Team:    Hao Meng, PA-C  Angela Duke, PA-C or   Krista Kroeger, PA-C     

## 2020-11-05 DIAGNOSIS — H43813 Vitreous degeneration, bilateral: Secondary | ICD-10-CM | POA: Diagnosis not present

## 2020-11-05 DIAGNOSIS — H0100A Unspecified blepharitis right eye, upper and lower eyelids: Secondary | ICD-10-CM | POA: Diagnosis not present

## 2020-11-05 DIAGNOSIS — H0100B Unspecified blepharitis left eye, upper and lower eyelids: Secondary | ICD-10-CM | POA: Diagnosis not present

## 2020-11-05 DIAGNOSIS — H52203 Unspecified astigmatism, bilateral: Secondary | ICD-10-CM | POA: Diagnosis not present

## 2020-11-10 ENCOUNTER — Ambulatory Visit: Payer: Medicare Other | Admitting: Medical

## 2020-11-16 ENCOUNTER — Ambulatory Visit (INDEPENDENT_AMBULATORY_CARE_PROVIDER_SITE_OTHER): Payer: Medicare Other | Admitting: Family

## 2020-11-16 ENCOUNTER — Other Ambulatory Visit: Payer: Self-pay

## 2020-11-16 VITALS — BP 150/75 | HR 74 | Temp 98.0°F | Ht 66.0 in | Wt 230.0 lb

## 2020-11-16 DIAGNOSIS — T148XXA Other injury of unspecified body region, initial encounter: Secondary | ICD-10-CM | POA: Diagnosis not present

## 2020-11-16 DIAGNOSIS — I509 Heart failure, unspecified: Secondary | ICD-10-CM | POA: Diagnosis not present

## 2020-11-16 DIAGNOSIS — E785 Hyperlipidemia, unspecified: Secondary | ICD-10-CM | POA: Diagnosis not present

## 2020-11-16 DIAGNOSIS — M8580 Other specified disorders of bone density and structure, unspecified site: Secondary | ICD-10-CM

## 2020-11-16 DIAGNOSIS — E876 Hypokalemia: Secondary | ICD-10-CM | POA: Diagnosis not present

## 2020-11-16 DIAGNOSIS — F32A Depression, unspecified: Secondary | ICD-10-CM | POA: Diagnosis not present

## 2020-11-16 DIAGNOSIS — G4733 Obstructive sleep apnea (adult) (pediatric): Secondary | ICD-10-CM | POA: Diagnosis not present

## 2020-11-16 MED ORDER — VALSARTAN 320 MG PO TABS: 320.0000 mg | ORAL_TABLET | Freq: Every day | ORAL | 3 refills | Status: DC

## 2020-11-16 NOTE — Assessment & Plan Note (Signed)
Wt Readings from Last 3 Encounters:  11/16/20 230 lb (104.3 kg)  10/29/20 235 lb 12.8 oz (107 kg)  10/06/20 246 lb 6.4 oz (111.8 kg)   She is being monitored closely by cardiology and is back on Bipap. Notes improvement in her swelling.

## 2020-11-16 NOTE — Assessment & Plan Note (Signed)
Lab Results  Component Value Date   CHOL 167 05/04/2020   HDL 79 05/04/2020   LDLCALC 70 05/04/2020   LDLDIRECT 110.4 12/13/2006   TRIG 94 05/04/2020   CHOLHDL 2.1 05/04/2020   Lipids at goal Continue simvastatin 10 mg.

## 2020-11-16 NOTE — Assessment & Plan Note (Signed)
Reports stable mood on prozac 10mg . Continue same.

## 2020-11-16 NOTE — Assessment & Plan Note (Signed)
Reports improved compliance with bipap.

## 2020-11-16 NOTE — Assessment & Plan Note (Signed)
She has been on fosamax for a total of about 3 years in all. Continue 70mg  once weekly.

## 2020-11-16 NOTE — Assessment & Plan Note (Signed)
New. Discussed wound care.  Dressing was changed today in office by myself.

## 2020-11-16 NOTE — Patient Instructions (Signed)
Please complete lab work prior to leaving. Call if increased redness, pain of left shin wound.

## 2020-11-16 NOTE — Progress Notes (Signed)
Subjective:   By signing my name below, I, [name], attest that this documentation has been prepared under the direction and in the presence of Sandford Craze NP. 11/16/2020      Patient ID: [name], female    DOB: May 27, 1941, 80 y.o.   MRN: [id number]  No chief complaint on file.   HPI Patient is in today for a office visit. She complains of a skin tear on her left lower leg. She fell down the stairs that did not have any rails on them. She notes that her cut seeps liquid. She her received her first shingles vaccine and completed her tetanus vaccinations.  Hypertension- She continues taking her 40 mg lasix 2x daily PO and reports doing better. She also started taking half 10 meq potasium daily PO and reports doing better. Diet- She notes that she has lost 14 lb since her last 2 visits. Wt Readings from Last 3 Encounters:  11/16/20 230 lb (104.3 kg)  10/29/20 235 lb 12.8 oz (107 kg)  10/06/20 246 lb 6.4 oz (111.8 kg)   Allergy- She is taking Flonase nasal spray and 180 mg allegra daily PO to manage her allergies and finds great relief. GERD- She takes 20 mg Pepcid 2x daily PO to manage her GERD. Mood- She continues taking 10 mg prozac and finds her symptoms improve.  States she had a mechanical fall trying to get up some steps with assistance and scraped her left shin. No other injury.   Past Medical History:  Diagnosis Date  . Allergic rhinitis   . Ankle fracture    Left, Mortise Widening  . Arthritis    "knuckles" (06/17/2012)  . 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 Breast cancer (HCC)    "right" (06/17/2012)  . Cataracts, bilateral   . Dyspnea    with exertion  . Exertional dyspnea   . GERD (gastroesophageal reflux disease)   . Hypertension   . Iron deficiency anemia    "hematologist watches it" (06/17/2012)  . Obesity   . OSA treated with BiPAP 03/08/2016  . Osteoarthritis    severe right hip osteoarthritis-s/p hip replacement  . Osteopenia  11/26/2009  . Thyroid disorder    "sees endocrinologist yearly" (06/17/2012)    Past Surgical History:  Procedure Laterality Date  . ANKLE FRACTURE SURGERY Right 01/01/14   Has pins. Procedure performed at [facility]  . BREAST BIOPSY  ` 1992   "bilaterlly" (06/17/2012)  . COLONOSCOPY W/ POLYPECTOMY    . EYE SURGERY  2011   cataract removal both eyes  . HIP CLOSED REDUCTION  07/26/2012   Procedure: CLOSED MANIPULATION HIP;  Surgeon: Mable Paris, MD;  Location: [facility];  Service: Orthopedics;  Laterality: Right;  . LUMBAR LAMINECTOMY  10/26/2017  . LUMBAR LAMINECTOMY/DECOMPRESSION MICRODISCECTOMY Bilateral 10/25/2017   Procedure: LUMBAR 2-3, LUMBAR 3-4 DECOMPRESSION TIME REQUESTED 4.5 HOURS;  Surgeon: Estill Bamberg, MD;  Location: [facility];  Service: Orthopedics;  Laterality: Bilateral;  . MASTECTOMY  1993?   bilateral mastectomy  . ORIF ANKLE FRACTURE Left 07/30/2017   Procedure: LEFT OPEN REDUCTION INTERNAL FIXATION (ORIF) ANKLE FRACTURE WITH SYDESMOTIC FIXATION;  Surgeon: Jodi Geralds, MD;  Location: [facility] OR;  Service: Orthopedics;  Laterality: Left;  . TONSILLECTOMY  1949  . TOTAL HIP ARTHROPLASTY  04/2006   right hip   . TOTAL HIP REVISION  06/17/2012   "right" (06/17/2012)  . TOTAL HIP REVISION  06/17/2012   Procedure: TOTAL HIP REVISION;  Surgeon: [name], MD;  Location: [facility];  Service: Orthopedics;  Laterality: Right;    Family History  Problem Relation Age of Onset  . Heart failure Father        deceased age 90  . Diabetes Father   . Alcohol abuse Father   . Breast cancer Mother        deceased at age 55 secondary to breast cancer  . Liver disease Sister        Liver failure--deceased  . Dementia Maternal Grandmother   . Colon cancer Neg Hx   . Esophageal cancer Neg Hx   . Rectal cancer Neg Hx   . Stomach cancer Neg Hx     Social History   Socioeconomic History  . Marital status: Widowed    Spouse name: Not on file  . Number of children: Not on  file  . Years of education: Not on file  . Highest education level: Not on file  Occupational History  . Not on file  Tobacco Use  . Smoking status: Former Smoker    Packs/day: 0.50    Years: 10.00    Pack years: 5.00    Types: Cigarettes    Quit date: 07/14/1974    Years since quitting: 46.3  . Smokeless tobacco: Never Used  . Tobacco comment: quit in 1976 smoked for approx. 10 years  Vaping Use  . Vaping Use: Never used  Substance and Sexual Activity  . Alcohol use: Yes    Comment: wine on occasion. rare.   . Drug use: No  . Sexual activity: Never  Other Topics Concern  . Not on file  Social History Narrative   Retired   The patient is married and has one child and two grandchildren that live in the area.     She drinks wine with dinner.  She quit smoking in 1976, smoked for   approximately 10 years.      Moved to [place] from [place], [place]         Social Determinants of Health   Financial Resource Strain: Not on file  Food Insecurity: Not on file  Transportation Needs: Not on file  Physical Activity: Not on file  Stress: Not on file  Social Connections: Not on file  Intimate Partner Violence: Not on file    Outpatient Medications Prior to Visit  Medication Sig Dispense Refill  . acetaminophen (TYLENOL) 500 MG tablet Take 1,000 mg by mouth. Take with Gabapetin    . alendronate (FOSAMAX) 70 MG tablet Take 1 tablet (70 mg total) by mouth every 7 (seven) days. Take with a full glass of water on an empty stomach. 12 tablet 4  . bisoprolol (ZEBETA) 10 MG tablet Take 2 tablets (20 mg total) by mouth daily. 180 tablet 3  . famotidine (PEPCID) 20 MG tablet Take 1 tablet (20 mg total) by mouth 2 (two) times daily. 180 tablet 1  . fexofenadine (ALLEGRA) 180 MG tablet Take 180 mg by mouth daily as needed. For seasonal allergies    . FLUoxetine (PROZAC) 10 MG capsule Take 1 capsule (10 mg total) by mouth daily. 90 capsule 1  . fluticasone (FLONASE) 50 MCG/ACT nasal spray USE  1 SPRAY IN EACH NOSTRIL EVERY DAY 32 g 1  . furosemide (LASIX) 40 MG tablet Take 1 tablet (40 mg total) by mouth 2 (two) times daily. 180 tablet 3  . gabapentin (NEURONTIN) 100 MG capsule TAKE 1 CAPSULE BY MOUTH THREE TIMES A DAY 270 capsule 2  . hydrALAZINE (APRESOLINE) 25  MG tablet Take 1 tablet (25 mg total) by mouth in the morning and at bedtime. 180 tablet 1  . Lidocaine 4 % PTCH Apply 1 patch topically daily as needed (pain).    . Omega-3 Fatty Acids (FISH OIL) 1200 MG CAPS Take by mouth.    Bertram Gala Glycol-Propyl Glycol (SYSTANE OP) Apply 1 drop to eye daily as needed (dry eyes).    . potassium chloride (KLOR-CON) 10 MEQ tablet Take 10 mEq by mouth daily.    . simvastatin (ZOCOR) 10 MG tablet Take 1 tablet (10 mg total) by mouth daily. 90 tablet 1  . valsartan (DIOVAN) 320 MG tablet Take 1 tablet (320 mg total) by mouth daily. 90 tablet 3   No facility-administered medications prior to visit.    Allergies  Allergen Reactions  . Amlodipine Swelling    Swelling   . Sulfonamide Derivatives Swelling    REACTION: Swelling of the face; "eyes swelled shut"  . Myrbetriq [Mirabegron]     Facial swelling, sob  . Chocolate Other (See Comments)    Sinus problems    Review of Systems  Skin:       (+)Skin tear on left lower leg.       Objective:    Physical Exam Constitutional:      Appearance: Normal appearance.  HENT:     Head: Normocephalic and atraumatic.     Right Ear: External ear normal.     Left Ear: External ear normal.  Eyes:     Extraocular Movements: Extraocular movements intact.     Pupils: Pupils are equal, round, and reactive to light.  Cardiovascular:     Rate and Rhythm: Normal rate and regular rhythm.     Pulses: Normal pulses.     Heart sounds: Normal heart sounds.  Pulmonary:     Effort: Pulmonary effort is normal. No respiratory distress.     Breath sounds: Normal breath sounds. No stridor. No wheezing, rhonchi or rales.  Musculoskeletal:      Right lower leg: 3+ Edema present.     Left lower leg: 3+ Edema present.  Skin:    General: Skin is warm and dry.     Comments: Skin abrasion left shin.  Neurological:     Mental Status: She is alert and oriented to person, place, and time.  Psychiatric:        Behavior: Behavior normal.      LMP 07/11/1991  Wt Readings from Last 3 Encounters:  10/29/20 235 lb 12.8 oz (107 kg)  10/06/20 246 lb 6.4 oz (111.8 kg)  08/17/20 246 lb (111.6 kg)    Diabetic Foot Exam - Simple   No data filed    Lab Results  Component Value Date   WBC 8.7 08/11/2020   HGB 10.3 (L) 08/11/2020   HCT 31.3 (L) 08/11/2020   PLT 279.0 08/11/2020   GLUCOSE 105 (H) 10/13/2020   CHOL 167 05/04/2020   TRIG 94 05/04/2020   HDL 79 05/04/2020   LDLDIRECT 110.4 12/13/2006   LDLCALC 70 05/04/2020   ALT 9 08/11/2020   AST 18 08/11/2020   NA 136 10/13/2020   K 4.5 10/13/2020   CL 99 10/13/2020   CREATININE 1.64 (H) 10/13/2020   BUN 43 (H) 10/13/2020   CO2 17 (L) 10/13/2020   TSH 0.99 09/22/2015   INR 1.00 10/18/2017   HGBA1C 5.7 (H) 05/04/2020    Lab Results  Component Value Date   TSH 0.99 09/22/2015   Lab Results  Component Value Date   WBC 8.7 08/11/2020   HGB 10.3 (L) 08/11/2020   HCT 31.3 (L) 08/11/2020   MCV 99.2 08/11/2020   PLT 279.0 08/11/2020   Lab Results  Component Value Date   NA 136 10/13/2020   K 4.5 10/13/2020   CO2 17 (L) 10/13/2020   GLUCOSE 105 (H) 10/13/2020   BUN 43 (H) 10/13/2020   CREATININE 1.64 (H) 10/13/2020   BILITOT 0.6 08/11/2020   ALKPHOS 69 08/11/2020   AST 18 08/11/2020   ALT 9 08/11/2020   PROT 7.0 08/11/2020   ALBUMIN 4.3 08/11/2020   CALCIUM 10.7 (H) 10/13/2020   ANIONGAP 10 10/18/2017   EGFR 32 (L) 10/13/2020   GFR 30.03 (L) 08/17/2020   Lab Results  Component Value Date   CHOL 167 05/04/2020   Lab Results  Component Value Date   HDL 79 05/04/2020   Lab Results  Component Value Date   LDLCALC 70 05/04/2020   Lab Results  Component  Value Date   TRIG 94 05/04/2020   Lab Results  Component Value Date   CHOLHDL 2.1 05/04/2020   Lab Results  Component Value Date   HGBA1C 5.7 (H) 05/04/2020       Assessment & Plan:   Problem List Items Addressed This Visit   None      No orders of the defined types were placed in this encounter.   I, Debbrah Alar NP, personally preformed the services described in this documentation.  All medical record entries made by the scribe were at my direction and in my presence.  I have reviewed the chart and discharge instructions (if applicable) and agree that the record reflects my personal performance and is accurate and complete. 11/16/2020   I,[name],acting as a Education administrator for Nance Pear, NP.,have documented all relevant documentation on the behalf of Nance Pear, NP,as directed by  Nance Pear, NP while in the presence of Nance Pear, NP.   Shehryar [employer]

## 2020-11-17 ENCOUNTER — Encounter: Payer: Self-pay | Admitting: Family

## 2020-11-17 LAB — BASIC METABOLIC PANEL
BUN: 60 mg/dL — ABNORMAL HIGH (ref 6–23)
CO2: 21 mEq/L (ref 19–32)
Calcium: 10.6 mg/dL — ABNORMAL HIGH (ref 8.4–10.5)
Chloride: 104 mEq/L (ref 96–112)
Creatinine, Ser: 1.85 mg/dL — ABNORMAL HIGH (ref 0.40–1.20)
GFR: 25.94 mL/min — ABNORMAL LOW (ref >60.00)
Glucose, Bld: 108 mg/dL — ABNORMAL HIGH (ref 70–99)
Potassium: 5.1 mEq/L (ref 3.5–5.1)
Sodium: 137 mEq/L (ref 135–145)

## 2020-11-24 ENCOUNTER — Other Ambulatory Visit: Payer: Self-pay

## 2020-11-24 MED ORDER — FLUOXETINE HCL 10 MG PO CAPS: 10.0000 mg | ORAL_CAPSULE | Freq: Every day | ORAL | 1 refills | Status: DC

## 2020-11-24 MED ORDER — GABAPENTIN 100 MG PO CAPS: ORAL_CAPSULE | ORAL | 2 refills | Status: DC

## 2021-01-09 ENCOUNTER — Other Ambulatory Visit: Payer: Self-pay | Admitting: Family

## 2021-01-18 ENCOUNTER — Ambulatory Visit (INDEPENDENT_AMBULATORY_CARE_PROVIDER_SITE_OTHER): Payer: Medicare Other | Admitting: Pharmacist

## 2021-01-18 DIAGNOSIS — I509 Heart failure, unspecified: Secondary | ICD-10-CM

## 2021-01-18 DIAGNOSIS — E785 Hyperlipidemia, unspecified: Secondary | ICD-10-CM

## 2021-01-18 DIAGNOSIS — F32A Depression, unspecified: Secondary | ICD-10-CM | POA: Diagnosis not present

## 2021-01-18 DIAGNOSIS — I10 Essential (primary) hypertension: Secondary | ICD-10-CM | POA: Diagnosis not present

## 2021-01-18 DIAGNOSIS — M8580 Other specified disorders of bone density and structure, unspecified site: Secondary | ICD-10-CM

## 2021-01-18 DIAGNOSIS — N1832 Chronic kidney disease, stage 3b: Secondary | ICD-10-CM

## 2021-01-18 NOTE — Progress Notes (Signed)
See other CCM visit note

## 2021-01-18 NOTE — Chronic Care Management (AMB) (Signed)
Chronic Care Management Pharmacy Note   01/18/2021 Name:  [name]       MRN:  [id number]      DOB:  05-Oct-1940   Summary: Decreasing renal function Patient report increase nighttime cramps since stopping potassium supplement.  Recommendations/Changes made from today's visit: Consider rechecking BMP If CrCl still < 35 mL/ min consider stopping alendronate and switch to either ibandronate or Prolia.  Consider referral to nephrologist.   Subjective: [name] is an 80 y.o. year old female who is a primary patient of Courtney Alar, NP.  The CCM team was consulted for assistance with disease management and care coordination needs.     Engaged with patient by telephone for follow up visit in response to provider referral for pharmacy case management and/or care coordination services.   Consent to Services: The patient was given information about Chronic Care Management services, agreed to services, and gave verbal consent prior to initiation of services.  Please see initial visit note for detailed documentation.    Patient Care Team: Courtney Alar, NP as PCP - General (Internal Medicine) Courtney Pickett Nadean Corwin, MD as PCP - Cardiology (Cardiology) Volanda Napoleon, MD as Consulting Physician (Hematology and Oncology) Marygrace Drought, MD as Consulting Physician (Ophthalmology) Frederik Pear, MD as Consulting Physician (Orthopedic Surgery) Pixie Casino, MD as Consulting Physician (Cardiology) Troy Sine, MD as Consulting Physician (Cardiology) Phylliss Bob, MD as Consulting Physician (Orthopedic Surgery)   Recent office visits: 11/16/2020 - PCP [facility]) Follow up. Noted fall on stairs with skin tear. BMP checked - Potassium at 5.1 - high end of normal. Stopped potassium supplement. No other med changes.    Recent consult visits: 10/29/2020 - Cardio [facility]) F/U HTN and CHF. Improved edema and dyspnea noted. No med changes.  10/06/2020 - Cardio [facility])  CHF and edema. Increased furosemide to $RemoveBefor'40mg'ZWWAeiNxjZYh$  twice a day. Restart potassium 10 mEq daily.  08/13/2020 - Physical therapy - gait abnormality and muscle wasting in right leg and kyphosis.    Hospital visits: None in previous 6 months   Objective:   Recent Labs       Lab Results  Component Value Date    CREATININE 1.85 (H) 11/16/2020    CREATININE 1.64 (H) 10/13/2020    CREATININE 1.72 (H) 10/06/2020        Recent Labs       Lab Results  Component Value Date    HGBA1C 5.7 (H) 05/04/2020      Last diabetic Eye exam:  Labs (Brief)  No results found for: HMDIABEYEEXA    Last diabetic Foot exam:  Labs (Brief)  No results found for: HMDIABFOOTEX      Labs (Brief)          Component Value Date/Time    CHOL 167 05/04/2020 1402    TRIG 94 05/04/2020 1402    HDL 79 05/04/2020 1402    CHOLHDL 2.1 05/04/2020 1402    VLDL 14.2 12/25/2018 0854    LDLCALC 70 05/04/2020 1402    LDLDIRECT 110.4 12/13/2006 0900        Hepatic Function Latest Ref Rng & Units 08/11/2020 05/04/2020 07/22/2019  Total Protein 6.0 - 8.3 g/dL 7.0 6.9 -  Albumin 3.5 - 5.2 g/dL 4.3 - 4.2  AST 0 - 37 U/L 18 17 -  ALT 0 - 35 U/L 9 10 -  Alk Phosphatase 39 - 117 U/L 69 - -  Total Bilirubin 0.2 - 1.2 mg/dL 0.6 0.7 -  Bilirubin, Direct 0.0 -  0.3 mg/dL - - -      Labs (Brief)       Lab Results  Component Value Date/Time    TSH 0.99 09/22/2015 11:56 AM    TSH 1.71 12/09/2014 03:43 PM        CBC Latest Ref Rng & Units 08/11/2020 05/04/2020 06/20/2019  WBC 4.0 - 10.5 K/uL 8.7 10.5 7.9  Hemoglobin 12.0 - 15.0 g/dL 10.3(L) 10.8(L) 10.6(L)  Hematocrit 36.0 - 46.0 % 31.3(L) 32.3(L) 31.9(L)  Platelets 150.0 - 400.0 K/uL 279.0 303 249.0      Labs (Brief)       Lab Results  Component Value Date/Time    VD25OH 65.63 07/22/2019 11:30 AM    VD25OH 89.96 12/26/2018 11:37 AM        Clinical ASCVD: No  The 10-year ASCVD risk score Denman George DC Jr., et al., 2013) is: 34.5%   Values used to calculate the  score:     Age: 5 years     Sex: Female     Is Non-Hispanic African American: No     Diabetic: No     Tobacco smoker: No     Systolic Blood Pressure: 150 mmHg     Is BP treated: Yes     HDL Cholesterol: 79 mg/dL     Total Cholesterol: 167 mg/dL     Other:  Creatinine Clearance (Cockcroft-Gault Equation) from [email or web address] on 01/18/2021 RESULT SUMMARY: 42 mL/min Creatinine clearance, original Cockcroft-Gault   31 mL/min Creatinine clearance modified for overweight patient, using adjusted body weight of 77 kg (170 lbs).  INPUTS: Sex --> 1 = Female Age --> 79 years Weight --> 230 lbs Creatinine --> 1.85 mg/dL Height --> 66 in  Last DEXA Scan: 05/13/2020             T-Score femoral neck: -1.8 (-1.9 in 2018)             T-Score forearm radius: -1.8 (-1.2 in 2018)   Social History        Tobacco Use  Smoking Status Former   Packs/day: 0.50   Years: 10.00   Pack years: 5.00   Types: Cigarettes   Quit date: 07/14/1974   Years since quitting: 46.5  Smokeless Tobacco Never  Tobacco Comments    quit in 1976 smoked for approx. 10 years       BP Readings from Last 3 Encounters:  11/16/20 (!) 150/75  10/29/20 138/77  10/06/20 (!) 148/80       Pulse Readings from Last 3 Encounters:  11/16/20 74  10/29/20 70  10/06/20 68       Wt Readings from Last 3 Encounters:  11/16/20 230 lb (104.3 kg)  10/29/20 235 lb 12.8 oz (107 kg)  10/06/20 246 lb 6.4 oz (111.8 kg)      Assessment: Review of patient past medical history, allergies, medications, health status, including review of consultants reports, laboratory and other test data, was performed as part of comprehensive evaluation and provision of chronic care management services.   SDOH:  (Social Determinants of Health) assessments and interventions performed:      CCM Care Plan        Allergies  Allergen Reactions   Amlodipine Swelling      Swelling     Sulfonamide Derivatives Swelling      REACTION: Swelling of the  face; "eyes swelled shut"   Myrbetriq [Mirabegron]        Facial swelling, sob   Chocolate Other (See Comments)  Sinus problems      Medications Reviewed Today       Reviewed by Tora Kindred, CMA (Certified Medical Assistant) on 11/16/20 at 1525  Med List Status: <None>    Medication Order Taking? Sig Documenting Provider Last Dose Status Informant  acetaminophen (TYLENOL) 500 MG tablet [id number] Yes Take 1,000 mg by mouth. Take with Gabapetin [provider] Taking Active    alendronate (FOSAMAX) 70 MG tablet [id number] Yes Take 1 tablet (70 mg total) by mouth every 7 (seven) days. Take with a full glass of water on an empty stomach. Courtney Alar, NP Taking Active    bisoprolol (ZEBETA) 10 MG tablet [id number] Yes Take 2 tablets (20 mg total) by mouth daily. Pixie Casino, MD Taking Active                     Med Note Vivien Presto Jul 21, 2020 10:38 AM) Taking #1 tab twice daily since starting hydralazine  famotidine (PEPCID) 20 MG tablet [id number] Yes Take 1 tablet (20 mg total) by mouth 2 (two) times daily. Courtney Alar, NP Taking Active    fexofenadine (ALLEGRA) 180 MG tablet [id number] Yes Take 180 mg by mouth daily as needed. For seasonal allergies [provider] Taking Active Self  FLUoxetine (PROZAC) 10 MG capsule [id number] Yes Take 1 capsule (10 mg total) by mouth daily. Courtney Alar, NP Taking Active    fluticasone [facility]) 50 MCG/ACT nasal spray [id number] Yes USE 1 SPRAY IN EACH NOSTRIL EVERY DAY Courtney Alar, NP Taking Active Self  furosemide (LASIX) 40 MG tablet [id number] Yes Take 1 tablet (40 mg total) by mouth 2 (two) times daily. Almyra Deforest, [place] Taking Active    gabapentin (NEURONTIN) 100 MG capsule [id number] Yes TAKE 1 CAPSULE BY MOUTH THREE TIMES A DAY Courtney Alar, NP Taking Active    hydrALAZINE (APRESOLINE) 25 MG tablet [id number] Yes Take 1 tablet (25 mg total) by mouth in the morning and at bedtime.  Courtney Alar, NP Taking Active    Lidocaine 4 % [facility] [id number] Yes Apply 1 patch topically daily as needed (pain). [provider] Taking Active Self  Omega-3 Fatty Acids (FISH OIL) 1200 MG CAPS [id number] Yes Take by mouth. [provider] Taking Active    Polyethyl Glycol-Propyl Glycol (SYSTANE OP) [id number] Yes Apply 1 drop to eye daily as needed (dry eyes). [provider] Taking Active Self  potassium chloride (KLOR-CON) 10 MEQ tablet [id number] Yes Take 10 mEq by mouth daily. [provider] Taking Active    simvastatin (ZOCOR) 10 MG tablet [id number] Yes Take 1 tablet (10 mg total) by mouth daily. Courtney Alar, NP Taking Active    valsartan (DIOVAN) 320 MG tablet [id number] Yes Take 1 tablet (320 mg total) by mouth daily. 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   Hypervitaminosis D 09/18/2018   Traumatic closed displaced fracture of lateral malleolus of left fibula, initial encounter 07/30/2017   Low back pain 07/30/2017   Displaced fracture of distal end of left fibula 07/30/2017   Acute on chronic diastolic heart failure ([place]) 49/70/2637   Diastolic heart failure ([place]) 03/08/2016   OSA (obstructive sleep apnea) 03/08/2016   Palpitations  10/29/2015   Snoring 10/29/2015   Excessive daytime sleepiness 10/29/2015   PVC's (premature ventricular contractions) 10/29/2015   SVT (supraventricular tachycardia) (HCC) 10/29/2015   Onychomycosis 09/23/2015   Leukocytosis 12/16/2014   Osteoarthritis of both hands 08/29/2014   CHF (congestive heart failure) ([place]) 07/14/2014   Adjustment disorder 06/28/2014   GERD (gastroesophageal reflux disease) 03/25/2014   Elevated lipase 06/26/2013   Cough 05/28/2013   Morbid obesity due to excess calories ([place]) 03/05/2013    Dislocation of hip prosthesis ([place]) 07/26/2012   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   Hyperglycemia 12/28/2011   Abdominal pain 12/06/2011   Hyperparathyroidism ([place]) 10/10/2011   Overactive bladder 09/06/2011   Hip pain, right 06/05/2011   Borderline diabetes mellitus 03/06/2011   HYPERCALCEMIA 11/29/2009   Osteopenia 11/26/2009   ALLERGIC RHINITIS 09/24/2008   ADENOCARCINOMA, BREAST, HX OF 09/24/2008   Hyperlipemia 01/24/2008   ANEMIA 01/24/2008   Essential hypertension 12/13/2007          Immunization History  Administered Date(s) Administered   Influenza Split 03/22/2012   Influenza Whole 04/28/2008, 05/04/2009   Influenza, High Dose Seasonal PF 04/01/2015, 03/27/2016, 03/13/2017, 04/02/2018, 04/04/2019, 03/24/2020   Influenza,inj,Quad PF,6+ Mos 03/19/2013, 03/25/2014   PFIZER(Purple Top)SARS-COV-2 Vaccination 09/11/2019, 09/30/2019, 04/23/2020   Pneumococcal Conjugate-13 06/25/2013   Pneumococcal Polysaccharide-23 05/27/2001, 06/18/2012   Td 05/26/2008   Tdap 07/28/2020   Zoster Recombinat (Shingrix) 07/28/2020   Zoster, Live 12/01/2010      Conditions to be addressed/monitored: CHF, HTN, HLD, CKD Stage 3b / 4, Depression, and osteopenia  Care Plan   Current Barriers:  Unable to maintain control of HTN, hypercalcemia, hyperkalemia Declining renal function - last Scr / eGFR indicated stage 4  Pharmacist Clinical Goal(s):  Over the next 90 days, patient will achieve control of HTN, hypercalcemia and hyperkalemia as evidenced by BP <130/80 and calcium and potassium to remain in therapeutic ranges maintain control of HDL and CHF as evidenced by LDL <100 and no exacerbations of CHF  Monitor renal function, avoid nephrotoxic medications and adjust medication doses as needed based on renal function through collaboration with PharmD and provider.   Interventions: 1:1 collaboration with Courtney Alar, NP regarding development and update of comprehensive  plan of care as evidenced by provider attestation and co-signature Inter-disciplinary care team collaboration (see longitudinal plan of care) Comprehensive medication review performed; medication list updated in electronic medical record    Hypertension / CHF / Edema: Not at goal; Ideally BP goal <130/80 due to renal function however patient does have some dizziness when BP is <130/80 Current regimen:  Bisoprolol 10mg  - take 1 tablet twice a day OR 2 tablets once daily Hydralazine 25mg  - take 1 tablet twice daily Valsartan 320mg  - take 1 tablet each evening Furosemide 40mg  - take 1 tablet twice a day Patient has failed these meds in the past: losartan (efficacy), amlodipine (swelling), clonidine (hypotension), hctz (hyponatremia) Checking BP at home daily - reports BP today was 136/70 which about like other days.  Reports cramps at night since stopping potassium in May 2022 due to potassium of 5.1 CHF type: Diastolic or HFpEF Last ejection fraction was 60-65% on 09/06/2020 NYHA Class: III (marked limitation of activity) AHA HF Stage: C (Heart disease and symptoms present) Patient is checking weight daily and has deceased about 16lb since starting furosemide 40mg  bid (weight usually 229 to 231lbs)  Reports improved breathing and less edema over the last 2 months Interventions: Discussed blood pressure goal  Consider checking Basic metabolic panel /  Potassium especially since she reports cramping since potassium supplement was stopped. Will check in with PCP to see if wants to check now or wait until f/u 02/22/2021.  Recommended continue to check blood pressure daily, document, and provide at future appointments Ensure daily salt intake < 2300 mg/day Continue current medications for blood pressure, heart and swelling Continue to monitor weight daily  Hyperlipidemia Controlled; LDL goal < 100 Current regimen:  Fish Oil 1200mg  daily each morning Simvastatin 10mg  daily nightly Patient has  failed these meds in past: None noted Interventions: Discussed LDL goal Maintain cholesterol medication regimen.   Pre-Diabetes Controlled; maintain A1c goal <6.5% Current regimen:  Diet and exercise management   Interventions: Discussed a1c goal Continue to do exercises from physical therapy every day  Osteopenia / low bone density:  Goal: prevent falls and fracutres Last DEXA Scan: 05/13/2020             T-Score femoral neck: -1.8 (-1.9 in 2018)             T-Score forearm radius: -1.8 (-1.2 in 2018) History of hypercalcemia and elevated PTH. Evaluation from endocrinology 2019, noted that elevated PTH could be due to combo primary dysfunction in PTH regulation and secondary to CKD. Recommended consider referral to nephrology to monitor.  Also has history of elevated serum vitamin D so is not taking any calcium or vitamin supplementation  Last serum creatinine was 1.85 Last eGFR was 25.94  Estimated CrCL = 31 ml/min (using Adjusted body weight for patient's with BMI >25)  Current regimen:  Alendronate 70mg  - take 1 tablet once per WEEK. Take on empty stomach before eating, drinking or taking other medications. Take with a full glass of water. Do not lie down or recline for at least 30 minutes after taking alendronate.   Interventions: Reviewed how to take alendronate Discussed fall prevention tips Recommend recheck renal function. If CrCl still <35, recommend discontinue alendronate since not recommenced for CrCL <35 mL/min.  Checked alternative bisphosphonates - ibandronate and risedronate can be use to CrCl of 30 mL/min but per patient's [facility] Medicare drug benefits copay for ibandronate is $45/month compared to $1 for alendronate and copay for risedronate is 49% of med cost. Cost thru [facility] for 1 month of ibandronate is $26 and risendornate $61 at [facility].  Continue to use cane and walker regularly Practice fall prevention techniques  Depression:  Goal: improved symptoms of  depression Controlled Current regimen:  Fluoxetine 10mg  daily in morning Patient has failed these meds in past: None noted  Interventions: (addressed at previous visits)  Continue current therapy  Decreased kidney function:  Goal: prevent declined in kidney function and avoid nephrotoxic medication Creatinine Clearance (Cockcroft-Gault Equation) from [email or web address]  on 01/18/2021 42 mL/min (Creatinine clearance, original Cockcroft-Gault) 31 mL/min (Creatinine clearance modified for overweight patient, using adjusted body weight of 77 kg (170 lbs).) Current regimen:  none   Interventions: Discussed reason for avoiding NSAIDs like ibuprofen (can affect kidneys and increase blood pressure) Consider rechecking kidney function  Consider referral to nephrology  GERD:  Goal: control reflux symptoms Current therapy Famotidine 20mg  twice daily Patient has failed these meds in past: omeprazole (concern with kidneys)  Triggers: Fast food, greasy food Interventions: (addressed at previous visits)  Continue current medications  Osteoarthritis / Back Pain: Goal: manage back pain  Current therapy: Gabapentin 100mg  three times daily Acetaminophen 500mg  - Take 2 tablets = 1000mg  three times daily  Patient has failed these meds in past: None noted Interventions: (addressed  at previous visits) Continue current medications  Health Maintenance  Goal: complete health maintenance screenings/vaccinations Tdap completed 07/2020 First Shingrix completed 07/2020 Interventions: Recommended patient receive 2nd Shingrix vaccine  Patient self care activities - Over the next 180 days, patient will: Receive 2nd Shingrix vaccine (due 09/2020 to 01/2021)  Medication management [facility] Comprehensive medication review performed. Continue current medication management strateg Focus on medication adherence by filling and taking medications appropriately  Take medications as  prescribed Report any questions or concerns to PharmD and/or provider(s)  Patient Goals/Self-Care Activities Over the next 90 days, patient will:  take medications as prescribed, check blood pressure daily , document, and provide at future appointments, weigh daily, and contact provider if weight gain of > 3 lbs in 24 hours and > 5 lbs in 1 week, engage in dietary modifications by limiting intake of sodium to < $Re'2300mg'AdL$  , and continue to do exercises daily from physical therapy.   Follow Up Plan: Telephone follow up appointment with care management team member scheduled for:  6 weeks     Medication Assistance: None required.  Patient affirms current coverage meets needs.   Patient's preferred pharmacy is:   [id number] [place], [place] - [address] [place] [address] Phone: [phone] Fax: [phone]   [facility] ([facility] - [place], [place] [address] Phone: [phone] Fax: [phone]     Follow Up:  Patient agrees to Care Plan and Follow-up.   Plan: Telephone follow up appointment with care management team member scheduled for:  6 weeks to review labs and renal function    Cherre Robins, PharmD Clinical Pharmacist [place] [phone]

## 2021-01-23 ENCOUNTER — Other Ambulatory Visit: Payer: Self-pay | Admitting: Internal Medicine

## 2021-01-23 NOTE — Patient Instructions (Signed)
Visit Information  PATIENT GOALS:  Goals Addressed             This Visit's Progress    Chronic Care Management Pharmacy Care Plan   Not on track    [place] (see longitudinal plan of care for additional care plan information)  Current Barriers:  Chronic Disease Management support, education, and care coordination needs related to Hypertension/Stage 3b Chronic Kidney Disease, Hyperlipidemia, Pre-Diabetes, Heart Failure, Depression, Osteopenia, GERD, Osteoarthritis, Allergic Rhinitis   Hypertension / heart failure / lower leg swelling: BP Readings from Last 3 Encounters:  11/16/20 (!) 150/75  10/29/20 138/77  10/06/20 (!) 148/80  Pharmacist Clinical Goal(s): Over the next 180 days, patient will work with PharmD and providers to achieve BP goal <130/80 Current regimen:  Bisoprolol 10mg  - take 1 tablet twice a day OR 2 tablets once daily Hydralazine 25mg  - take 1 tablet twice daily Valsartan 320mg  - take 1 tablet each evening Furosemide 40mg  - take 1 tablet twice a day Interventions: Discussed blood pressure goal  Consider checking Basic metabolic panel / Potassium Patient self care activities - Over the next 180 days, patient will: Check blood pressure daily, document, and provide at future appointments Ensure daily salt intake < 2300 mg/day Continue current medications for blood pressure, heart and swelling Consider rechecking labs to assess potassium level  Hyperlipidemia Lab Results  Component Value Date/Time   LDLCALC 70 05/04/2020 02:02 PM   LDLDIRECT 110.4 12/13/2006 09:00 AM  Pharmacist Clinical Goal(s): Over the next 180 days, patient will work with PharmD and providers to maintain LDL goal < 100 Current regimen:  Fish Oil 1200mg  daily each morning Simvastatin 10mg  daily night  Interventions: Discussed LDL goal  Patient self care activities - Over the next 180 days, patient will: Maintain cholesterol medication regimen.   Pre-Diabetes Lab Results   Component Value Date/Time   HGBA1C 5.7 (H) 05/04/2020 02:02 PM   HGBA1C 5.9 12/25/2018 08:54 AM  Pharmacist Clinical Goal(s): Over the next 180 days, patient will work with PharmD and providers to maintain A1c goal <6.5% Current regimen:  Diet and exercise management   Interventions: Discussed a1c goal Patient self care activities - Over the next 180 days, patient will: Maintain a1c less than 6.5% Continue to do exercises from physical therapy every day  Osteopenia / low bone density:  Pharmacist Clinical Goal(s): Over the next 180 days, patient will work with PharmD and providers to prevent falls and fracutres Current regimen:  Alendronate 70mg  - take 1 tablet once per WEEK. Take on empty stomach before eating, drinking or taking other medications. Take with a full glass of water. Do not lie down or recline for at least 30 minutes after taking alendronate.   Interventions: Reviewed how to take alendronate Discussed fall prevention tips Patient self care activities - Over the next 180 days, patient will: Continue to take alendronate weekly Continue to use cane and walker regularly Practice fall prevention techniques  Decreased kidney function:  Pharmacist Clinical Goal(s): Over the next 180 days, patient will work with PharmD and providers to prevent declined in kidney function Current regimen:  none   Interventions: Discussed reason for avoiding NSAIDs like ibuprofen (can affect kidneys and increase blood pressure) Consider rechecking kidney function  Patient self care activities - Over the next 180 days, patient will: Avoid NSAIDs like ibuprofen and naproxen   Health Maintenance  Pharmacist Clinical Goal(s) Over the next 180 days, patient will work with PharmD and providers to complete health maintenance  screenings/vaccinations Interventions: Recommended patient receive 2nd Shingrix vaccine Patient self care activities - Over the next 180 days, patient will: Receive 2nd  Shingrix vaccine (due 09/2020 to 01/2021)  Medication management Pharmacist Clinical Goal(s): Over the next 180 days, patient will work with PharmD and providers to maintain optimal medication adherence [facility] Comprehensive medication review performed. Continue current medication management strategy Patient self care activities - Over the next 180 days, patient will: Focus on medication adherence by filling and taking medications appropriately  Take medications as prescribed Report any questions or concerns to PharmD and/or provider(s)  Please see past updates related to this goal by clicking on the "Past Updates" button in the selected goal          Patient verbalizes understanding of instructions provided today and agrees to view in [place].   The care management team will reach out to the patient again over the next 45 days.   Cherre Robins, PharmD Clinical Pharmacist [facility] [phone]

## 2021-01-25 ENCOUNTER — Telehealth: Payer: Self-pay | Admitting: Family

## 2021-01-25 DIAGNOSIS — N1832 Chronic kidney disease, stage 3b: Secondary | ICD-10-CM

## 2021-01-25 NOTE — Telephone Encounter (Signed)
Called pt and lvm to return call.  

## 2021-01-25 NOTE — Telephone Encounter (Signed)
Please contact pt to schedule a follow up lab appointment to recheck her kidney function.

## 2021-01-26 NOTE — Telephone Encounter (Signed)
Pt called and lvm to return call 

## 2021-01-27 ENCOUNTER — Other Ambulatory Visit: Payer: Self-pay | Admitting: Family

## 2021-01-28 NOTE — Telephone Encounter (Signed)
Pt called and lvm to return call 

## 2021-01-31 ENCOUNTER — Encounter: Payer: Self-pay | Admitting: Internal Medicine

## 2021-02-01 NOTE — Telephone Encounter (Signed)
Left detailed message for patient to call and schedule lab appointment

## 2021-02-04 ENCOUNTER — Other Ambulatory Visit: Payer: Self-pay | Admitting: Family

## 2021-02-04 NOTE — Telephone Encounter (Signed)
Unable to reach letter sent

## 2021-02-16 ENCOUNTER — Other Ambulatory Visit (INDEPENDENT_AMBULATORY_CARE_PROVIDER_SITE_OTHER): Payer: Medicare Other

## 2021-02-16 ENCOUNTER — Other Ambulatory Visit: Payer: Self-pay

## 2021-02-16 DIAGNOSIS — N1832 Chronic kidney disease, stage 3b: Secondary | ICD-10-CM | POA: Diagnosis not present

## 2021-02-16 LAB — BASIC METABOLIC PANEL
BUN: 29 mg/dL — ABNORMAL HIGH (ref 6–23)
CO2: 26 mEq/L (ref 19–32)
Calcium: 10.3 mg/dL (ref 8.4–10.5)
Chloride: 101 mEq/L (ref 96–112)
Creatinine, Ser: 1.44 mg/dL — ABNORMAL HIGH (ref 0.40–1.20)
GFR: 34.98 mL/min — ABNORMAL LOW (ref >60.00)
Glucose, Bld: 95 mg/dL (ref 70–99)
Potassium: 3.9 mEq/L (ref 3.5–5.1)
Sodium: 138 mEq/L (ref 135–145)

## 2021-02-22 ENCOUNTER — Ambulatory Visit: Payer: Medicare Other | Admitting: Family

## 2021-03-01 ENCOUNTER — Ambulatory Visit (INDEPENDENT_AMBULATORY_CARE_PROVIDER_SITE_OTHER): Payer: Medicare Other | Admitting: Family

## 2021-03-01 ENCOUNTER — Other Ambulatory Visit: Payer: Self-pay

## 2021-03-01 VITALS — BP 172/74 | HR 74 | Temp 98.4°F | Resp 16 | Ht 64.0 in | Wt 233.0 lb

## 2021-03-01 DIAGNOSIS — J302 Other seasonal allergic rhinitis: Secondary | ICD-10-CM | POA: Diagnosis not present

## 2021-03-01 DIAGNOSIS — E785 Hyperlipidemia, unspecified: Secondary | ICD-10-CM | POA: Diagnosis not present

## 2021-03-01 DIAGNOSIS — I10 Essential (primary) hypertension: Secondary | ICD-10-CM | POA: Diagnosis not present

## 2021-03-01 DIAGNOSIS — G4733 Obstructive sleep apnea (adult) (pediatric): Secondary | ICD-10-CM | POA: Diagnosis not present

## 2021-03-01 DIAGNOSIS — F32A Depression, unspecified: Secondary | ICD-10-CM | POA: Diagnosis not present

## 2021-03-01 DIAGNOSIS — I509 Heart failure, unspecified: Secondary | ICD-10-CM | POA: Diagnosis not present

## 2021-03-01 DIAGNOSIS — Z1211 Encounter for screening for malignant neoplasm of colon: Secondary | ICD-10-CM | POA: Diagnosis not present

## 2021-03-01 NOTE — Progress Notes (Signed)
Subjective:     Patient ID: [name], female    DOB: 07/05/1941, 80 y.o.   MRN: [id number]  Chief Complaint  Patient presents with   Hypertension    Here for follow up   Congestive Heart Failure    Here for follow up    HPI Patient is in today for follow up.  CHF- Diastolic dysfunction noted on echo.  Wt Readings from Last 3 Encounters:  03/01/21 233 lb (105.7 kg)  11/16/20 230 lb (104.3 kg)  10/29/20 235 lb 12.8 oz (107 kg)    HTN- BP meds include hydralazine '25mg'$  HS, bisoprolol '10mg'$  once daily, valsartan '320mg'$ .  Reports bp at home is 135/75.   BP Readings from Last 3 Encounters:  03/01/21 (!) 172/74  11/16/20 (!) 150/75  10/29/20 138/77   OSA- continues bipap at home. Feels rested in the AM.   Hyperlipidemia-  Lab Results  Component Value Date   CHOL 167 05/04/2020   HDL 79 05/04/2020   LDLCALC 70 05/04/2020   LDLDIRECT 110.4 12/13/2006   TRIG 94 05/04/2020   CHOLHDL 2.1 05/04/2020   Mood is stable on prozac.    Using allegra which is helpful for her seasonal allergies.    Health Maintenance Due  Topic Date Due   Hepatitis C Screening  Never done   COVID-19 Vaccine (4 - Booster for Pfizer series) 07/24/2020   INFLUENZA VACCINE  02/07/2021    Past Medical History:  Diagnosis Date   Allergic rhinitis    Ankle fracture    Left, [place] [place]   Arthritis    "knuckles" (06/17/2012)   Borderline diabetes mellitus 03/06/2011   Breast cancer ([place])    "right" (06/17/2012)   Cataracts, bilateral    Dyspnea    with exertion   Exertional dyspnea    GERD (gastroesophageal reflux disease)    Hypertension    Iron deficiency anemia    "hematologist watches it" (06/17/2012)   Obesity    OSA treated with BiPAP 03/08/2016   Osteoarthritis    severe right hip osteoarthritis-s/p hip replacement   Osteopenia 11/26/2009   Thyroid disorder    "sees endocrinologist yearly" (06/17/2012)    Past Surgical History:  Procedure Laterality Date   ANKLE  FRACTURE SURGERY Right 01/01/14   Has pins. Procedure performed at [facility]   "bilaterlly" (06/17/2012)   COLONOSCOPY W/ POLYPECTOMY     EYE SURGERY  2011   cataract removal both eyes   HIP CLOSED REDUCTION  07/26/2012   Procedure: CLOSED MANIPULATION HIP;  Surgeon: Nita Sells, MD;  Location: [facility];  Service: Orthopedics;  Laterality: Right;   LUMBAR LAMINECTOMY  10/26/2017   LUMBAR LAMINECTOMY/DECOMPRESSION MICRODISCECTOMY Bilateral 10/25/2017   Procedure: LUMBAR 2-3, LUMBAR 3-4 DECOMPRESSION TIME REQUESTED 4.5 HOURS;  Surgeon: Phylliss Bob, MD;  Location: [facility];  Service: Orthopedics;  Laterality: Bilateral;   MASTECTOMY  1993?   bilateral mastectomy   ORIF ANKLE FRACTURE Left 07/30/2017   Procedure: LEFT OPEN REDUCTION INTERNAL FIXATION (ORIF) ANKLE FRACTURE WITH SYDESMOTIC FIXATION;  Surgeon: Dorna Leitz, MD;  Location: [facility];  Service: Orthopedics;  Laterality: Left;   TONSILLECTOMY  1949   TOTAL HIP ARTHROPLASTY  04/2006   right hip    TOTAL HIP REVISION  06/17/2012   "right" (06/17/2012)   TOTAL HIP REVISION  06/17/2012   Procedure: TOTAL HIP REVISION;  Surgeon: Kerin Salen, MD;  Location: [facility];  Service: Orthopedics;  Laterality: Right;  Family History  Problem Relation Age of Onset   Heart failure Father        deceased age 9   Diabetes Father    Alcohol abuse Father    Breast cancer Mother        deceased at age 58 secondary to breast cancer   Liver disease Sister        Liver failure--deceased   Dementia Maternal Grandmother    Colon cancer Neg Hx    Esophageal cancer Neg Hx    Rectal cancer Neg Hx    Stomach cancer Neg Hx     Social History   Socioeconomic History   Marital status: Widowed    Spouse name: Not on file   Number of children: Not on file   Years of education: Not on file   Highest education level: Not on file  Occupational History   Not on file  Tobacco Use   Smoking status: Former     Packs/day: 0.50    Years: 10.00    Pack years: 5.00    Types: Cigarettes    Quit date: 07/14/1974    Years since quitting: 46.6   Smokeless tobacco: Never   Tobacco comments:    quit in 1976 smoked for approx. 10 years  Vaping Use   Vaping Use: Never used  Substance and Sexual Activity   Alcohol use: Yes    Comment: wine on occasion. rare.    Drug use: No   Sexual activity: Never  Other Topics Concern   Not on file  Social History Narrative   Retired   The patient is married and has one child and two grandchildren that live in the area.     She drinks wine with dinner.  She quit smoking in 1976, smoked for   approximately 10 years.       Moved to [place] from [place], [place]         Social Determinants of Health   Financial Resource Strain: Low Risk    Difficulty of Paying Living Expenses: Not hard at all  Food Insecurity: Not on file  Transportation Needs: Not on file  Physical Activity: Sufficiently Active   Days of Exercise per Week: 7 days   Minutes of Exercise per Session: 30 min  Stress: Not on file  Social Connections: Not on file  Intimate Partner Violence: Not on file    Outpatient Medications Prior to Visit  Medication Sig Dispense Refill   acetaminophen (TYLENOL) 500 MG tablet Take 1,000 mg by mouth in the morning, at noon, and at bedtime. Take with Gabapetin     alendronate (FOSAMAX) 70 MG tablet Take 1 tablet (70 mg total) by mouth every 7 (seven) days. Take with a full glass of water on an empty stomach. 12 tablet 4   bisoprolol (ZEBETA) 10 MG tablet Take 2 tablets (20 mg total) by mouth daily. 180 tablet 3   famotidine (PEPCID) 20 MG tablet Take 1 tablet (20 mg total) by mouth 2 (two) times daily. 180 tablet 1   fexofenadine (ALLEGRA) 180 MG tablet Take 180 mg by mouth daily as needed. For seasonal allergies     FLUoxetine (PROZAC) 10 MG capsule TAKE 1 CAPSULE BY MOUTH EVERY DAY 90 capsule 1   fluticasone (FLONASE) 50 MCG/ACT nasal spray USE 1 SPRAY IN  EACH NOSTRIL EVERY DAY 32 g 1   furosemide (LASIX) 40 MG tablet Take 1 tablet (40 mg total) by mouth 2 (two) times daily. 180 tablet 3  gabapentin (NEURONTIN) 100 MG capsule TAKE 1 CAPSULE BY MOUTH THREE TIMES A DAY 270 capsule 2   hydrALAZINE (APRESOLINE) 25 MG tablet TAKE 1 TABLET IN THE MORNING  AND AT BEDTIME 180 tablet 1   Lidocaine 4 % PTCH Apply 1 patch topically daily as needed (pain).     Omega-3 Fatty Acids (FISH OIL) 1200 MG CAPS Take 1 capsule by mouth daily.     Polyethyl Glycol-Propyl Glycol (SYSTANE OP) Apply 1 drop to eye daily as needed (dry eyes).     simvastatin (ZOCOR) 10 MG tablet Take 1 tablet (10 mg total) by mouth daily. 90 tablet 0   valsartan (DIOVAN) 320 MG tablet TAKE 1 TABLET BY MOUTH EVERY DAY 90 tablet 3   No facility-administered medications prior to visit.    Allergies  Allergen Reactions   Amlodipine Swelling    Swelling    Sulfonamide Derivatives Swelling    REACTION: Swelling of the face; "eyes swelled shut"   Myrbetriq [Mirabegron]     Facial swelling, sob   Chocolate Other (See Comments)    Sinus problems    ROS    See HPI  Objective:    Physical Exam Constitutional:      Appearance: She is well-developed.  Cardiovascular:     Rate and Rhythm: Normal rate and regular rhythm.     Heart sounds: Normal heart sounds. No murmur heard. Pulmonary:     Effort: Pulmonary effort is normal. No respiratory distress.     Breath sounds: Normal breath sounds. No wheezing.  Musculoskeletal:        General: Swelling (2+ bilateral LE edema) present.  Psychiatric:        Behavior: Behavior normal.        Thought Content: Thought content normal.        Judgment: Judgment normal.    BP (!) 172/74 (BP Location: Right Arm, Patient Position: Sitting, Cuff Size: Large)   Pulse 74   Temp 98.4 F (36.9 C) (Oral)   Resp 16   Ht '5\' 4"'$  (1.626 m)   Wt 233 lb (105.7 kg)   LMP 07/11/1991   SpO2 98%   BMI 39.99 kg/m  Wt Readings from Last 3 Encounters:   03/01/21 233 lb (105.7 kg)  11/16/20 230 lb (104.3 kg)  10/29/20 235 lb 12.8 oz (107 kg)       Assessment & Plan:   Problem List Items Addressed This Visit       Unprioritized   Seasonal allergies    Stable on allegra.  Continue same.       OSA (obstructive sleep apnea)    Feels well in the AM on bipap. Reports good compliance.       Hyperlipemia - Primary    Maintained on atorvastatin '10mg'$  once daily.  Last LDL at goal.  Continue same.       Essential hypertension    BP Readings from Last 3 Encounters:  03/01/21 (!) 172/74  11/16/20 (!) 150/75  10/29/20 138/77  BP quite elevated today in the office.  She reports home reading was much better at 135/75. She will continue to monitor at home and let me know if her readings rise at home.  In the meantime, continue hydralazine '25mg'$  HS, bisoprolol '10mg'$  once daily, valsartan '320mg'$ .        Depression    Stable on prozac '10mg'$  once daily.       CHF (congestive heart failure) (HCC)    Appears euvolemic on lasix '40mg'$   bid. Continue same.       Other Visit Diagnoses     Colon cancer screening       Relevant Orders   Cologuard     Pt declines colonoscopy but would like to complete cologuard.  I am having [name] "[name]" maintain her fexofenadine, fluticasone, Polyethyl Glycol-Propyl Glycol (SYSTANE OP), Lidocaine, Fish Oil, bisoprolol, alendronate, acetaminophen, furosemide, famotidine, gabapentin, hydrALAZINE, valsartan, simvastatin, and FLUoxetine.  No orders of the defined types were placed in this encounter.

## 2021-03-03 DIAGNOSIS — J302 Other seasonal allergic rhinitis: Secondary | ICD-10-CM | POA: Insufficient documentation

## 2021-03-03 NOTE — Assessment & Plan Note (Signed)
Stable on prozac '10mg'$  once daily.

## 2021-03-03 NOTE — Assessment & Plan Note (Signed)
Appears euvolemic on lasix '40mg'$  bid. Continue same.

## 2021-03-03 NOTE — Assessment & Plan Note (Signed)
Stable on allegra.  Continue same  

## 2021-03-03 NOTE — Assessment & Plan Note (Signed)
BP Readings from Last 3 Encounters:  03/01/21 (!) 172/74  11/16/20 (!) 150/75  10/29/20 138/77   BP quite elevated today in the office.  She reports home reading was much better at 135/75. She will continue to monitor at home and let me know if her readings rise at home.  In the meantime, continue hydralazine '25mg'$  HS, bisoprolol '10mg'$  once daily, valsartan '320mg'$ .

## 2021-03-03 NOTE — Assessment & Plan Note (Signed)
Feels well in the AM on bipap. Reports good compliance.

## 2021-03-03 NOTE — Assessment & Plan Note (Addendum)
Maintained on atorvastatin '10mg'$  once daily.  Last LDL at goal.  Continue same.

## 2021-03-17 ENCOUNTER — Telehealth: Payer: Self-pay | Admitting: Pharmacist

## 2021-03-17 NOTE — Chronic Care Management (AMB) (Signed)
    Chronic Care Management Pharmacy Assistant   Name: [name]  MRN: [id number] DOB: 1940/07/24   Reason for Encounter: Disease State Hypertension   Recent office visits:  [id number] O'Sullian, NP (PCP) Seen for hypertension and congestive heart failure. Cologuard ordered. Follow up in 6 months.  Recent consult visits:  None noted  Hospital visits:  None in previous 6 months  Medications: Outpatient Encounter Medications as of 03/17/2021  Medication Sig Note   acetaminophen (TYLENOL) 500 MG tablet Take 1,000 mg by mouth in the morning, at noon, and at bedtime. Take with Gabapetin    alendronate (FOSAMAX) 70 MG tablet Take 1 tablet (70 mg total) by mouth every 7 (seven) days. Take with a full glass of water on an empty stomach.    bisoprolol (ZEBETA) 10 MG tablet Take 2 tablets (20 mg total) by mouth daily. 07/21/2020: Taking #1 tab twice daily since starting hydralazine   famotidine (PEPCID) 20 MG tablet Take 1 tablet (20 mg total) by mouth 2 (two) times daily. 01/18/2021: Taking as needed   fexofenadine (ALLEGRA) 180 MG tablet Take 180 mg by mouth daily as needed. For seasonal allergies    FLUoxetine (PROZAC) 10 MG capsule TAKE 1 CAPSULE BY MOUTH EVERY DAY    fluticasone (FLONASE) 50 MCG/ACT nasal spray USE 1 SPRAY IN EACH NOSTRIL EVERY DAY    furosemide (LASIX) 40 MG tablet Take 1 tablet (40 mg total) by mouth 2 (two) times daily.    gabapentin (NEURONTIN) 100 MG capsule TAKE 1 CAPSULE BY MOUTH THREE TIMES A DAY    hydrALAZINE (APRESOLINE) 25 MG tablet TAKE 1 TABLET IN THE MORNING  AND AT BEDTIME    Lidocaine 4 % PTCH Apply 1 patch topically daily as needed (pain).    Omega-3 Fatty Acids (FISH OIL) 1200 MG CAPS Take 1 capsule by mouth daily.    Polyethyl Glycol-Propyl Glycol (SYSTANE OP) Apply 1 drop to eye daily as needed (dry eyes).    simvastatin (ZOCOR) 10 MG tablet Take 1 tablet (10 mg total) by mouth daily.    valsartan (DIOVAN) 320 MG tablet TAKE 1 TABLET BY MOUTH  EVERY DAY    No facility-administered encounter medications on file as of 03/17/2021.   Current antihypertensive regimen:  Bisoprolol '10mg'$  - take 1 tablet twice a day OR 2 tablets once daily Hydralazine '25mg'$  - take 1 tablet twice daily Valsartan '320mg'$  - take 1 tablet each evening Furosemide '40mg'$  - take 1 tablet twice a day  How often are you checking your Blood Pressure? daily  Current home BP readings:        134/78-09/09/22  What recent interventions/DTPs have been made by any provider to improve Blood Pressure control since last CPP Visit: None noted  Any recent hospitalizations or ED visits since last visit with CPP? No  What diet changes have been made to improve Blood Pressure Control?  Patient states she does decrease her salt intake and has been watching what she eats.  What exercise is being done to improve your Blood Pressure Control?  Patient states she does some stretching exercises in the mornings as well as walking inside her home in the hall ways.  Adherence Review: Is the patient currently on ACE/ARB medication? Yes Does the patient have >5 day gap between last estimated fill dates? No   Star Rating Drugs: Simvastatin 10 mg Last filled:01/27/21 90 DS Valsartan 320 mg Last filled:01/24/21 90 DS  [name], [place]

## 2021-03-25 ENCOUNTER — Encounter: Payer: Self-pay | Admitting: Internal Medicine

## 2021-03-25 ENCOUNTER — Ambulatory Visit (INDEPENDENT_AMBULATORY_CARE_PROVIDER_SITE_OTHER): Payer: Medicare Other | Admitting: Internal Medicine

## 2021-03-25 ENCOUNTER — Other Ambulatory Visit: Payer: Self-pay

## 2021-03-25 VITALS — BP 168/92 | HR 72 | Ht 66.0 in | Wt 233.8 lb

## 2021-03-25 DIAGNOSIS — G4733 Obstructive sleep apnea (adult) (pediatric): Secondary | ICD-10-CM

## 2021-03-25 DIAGNOSIS — E669 Obesity, unspecified: Secondary | ICD-10-CM

## 2021-03-25 DIAGNOSIS — I10 Essential (primary) hypertension: Secondary | ICD-10-CM

## 2021-03-25 DIAGNOSIS — I5032 Chronic diastolic (congestive) heart failure: Secondary | ICD-10-CM | POA: Diagnosis not present

## 2021-03-25 NOTE — Progress Notes (Signed)
OFFICE NOTE  Chief Complaint:  No complaints  Primary Care Physician: Debbrah Alar, NP  HPI:  [name] is a 80 y.o. female who is currently referred to me for evaluation of palpitations. While [name] has noted some palpitations, she reports not being particularly bothered by them. Her past medical history significant for an episode of congestive heart failure back in January 2016. She presented with symptoms of anasarca including upper and lower extremity swelling. The time she was on amlodipine which was discontinued and she was started on Lasix. She had significant diuresis and an echocardiogram, however did showed normal systolic and reportedly diastolic function. Based on this findings is difficult to understand whether this was cardiac edema or some other cause of volume overload. Nonetheless, recently she's had some palpitations and was fitted with a monitor. The monitor demonstrated PACs, PVCs and an episode of SVT which was transient and spontaneously terminated. Does note that she's been having some more frequent episodes of palpitations but they are not significantly bothersome to her. She also reports some heaviness in her chest when she does activities, although she is very inactive due to her hip problem and walks with cane. She also has morbid obesity and hypertension. She does take Bystolic is one of her blood pressure medications. In addition I asked her screening questions for possible sleep apnea. She does report daytime fatigue, somnolence, poor sleep at night, witnessed snoring and occasional episodes of apnea. Her Epworth sleepiness score scale is greater than 10.  12/07/2015  [name] was seen today in follow-up of her recent stress test. This was negative for ischemia, was interpreted as low risk and showed an LVEF of 64%. She reports since her stress test her palpitations have improved quite a bit. She is on bisoprolol 10 mg twice a day. She has not  yet had her sleep study which is coming up on June 15. She does worry that she may not be able sleep well enough for that study. She typically goes to bed at 2 AM and sleeps till about 6 AM. She says she often naps during the day and this could be playing a role in her poor sleep at night. While she is tried to go to bed earlier she says she cannot fall asleep. She also reports that she's had about 5 pound weight gain over the past 2 days with notable increase in lower extremity swelling. She and her husband report increase salt intake with various foods over the holiday weekend.  03/08/2016  [name] returns back today for follow-up. She reported some improvement in her swelling with an increase in Lasix. In the interim she underwent a sleep study and was found to have significant sleep apnea requiring BiPAP. This is been fitted and she's been wearing it regularly and notes that she's had an improvement in her symptoms and energy level. Unfortunately she is grieving as she recently lost her husband to acute MI. He also had suffered with cancer for a number of years.  09/07/2016  [name] returns today for follow-up. She has managed to lose a few pounds since we last saw her. She reports her mood is improved, understandably after the death of her husband fairly recently. She reports good compliance with BiPAP recently saw Dr. Claiborne Billings who reports that she is doing well on her current settings. She has responded some increased dose Lasix for swelling and generally does well wearing her compression stockings. EKG today shows normal sinus rhythm.  She does report some occasional lower blood pressures in the morning which he takes all of her medications.  03/15/2017  [name] was seen today in follow-up. She reports increase in weight and lower extremity swelling. She's currently taking 20 mg twice daily. She's been compliant with her BiPAP and generally gets about 5 hours of sleep at night. She notes less  frequent palpitations. She denies any chest pain. Her blood pressure is now better controlled with the addition of clonidine.  02/03/2020  [name] seen today in follow-up.  I last saw her in September 2018.  Since then she has been seeing her primary care both in person and virtually.  She has actually lost some weight.  Weight is now 232 down from 248 pounds.  Unfortunately she had recent fall and injury to the hip as well as the ankles.  She still having difficulty walking related to that.  She denies any worsening shortness of breath.  Blood pressure has been running higher up to 180 today.  In general she says is around [id number] systolic.  One point she was on clonidine but was taken off of due to side effects and actually became hypotensive.  04/05/2020  [name] returns today for follow-up.  I had switched up her ARB to valsartan 320 mg daily.  She seems to be tolerating this with some improvement in blood pressures now around 140/90.  She says at home her diastolics are actually much lower in the mid 60s.  Renal function is stable if not slightly improved by bmet.  She continues to work to lose weight.  03/25/2021  [name] returns today for follow-up.  Overall she says she is doing well.  Blood pressure was elevated today however it tends to be in the office.  She says her home blood pressures generally run around 130/70.  She has had her blood pressure cuff validated with the in office readings.  Similar readings are at her primary care doctor's office.  She is on a combination of medications for blood pressure.  She notes she gets some occasional lightheadedness after taking her medicines in the morning but generally has been taking valsartan once a day and night.  PMHx:  Past Medical History:  Diagnosis Date   Allergic rhinitis    Ankle fracture    Left, [place]   Arthritis    "knuckles" (06/17/2012)   Borderline diabetes mellitus 03/06/2011   Breast cancer ([facility])    "right"  (06/17/2012)   Cataracts, bilateral    Dyspnea    with exertion   Exertional dyspnea    GERD (gastroesophageal reflux disease)    Hypertension    Iron deficiency anemia    "hematologist watches it" (06/17/2012)   Obesity    OSA treated with BiPAP 03/08/2016   Osteoarthritis    severe right hip osteoarthritis-s/p hip replacement   Osteopenia 11/26/2009   Thyroid disorder    "sees endocrinologist yearly" (06/17/2012)    Past Surgical History:  Procedure Laterality Date   ANKLE FRACTURE SURGERY Right 01/01/14   Has pins. Procedure performed at [facility]   "bilaterlly" (06/17/2012)   COLONOSCOPY W/ POLYPECTOMY     EYE SURGERY  2011   cataract removal both eyes   HIP CLOSED REDUCTION  07/26/2012   Procedure: CLOSED MANIPULATION HIP;  Surgeon: Nita Sells, MD;  Location: [facility];  Service: Orthopedics;  Laterality: Right;   LUMBAR LAMINECTOMY  10/26/2017   LUMBAR  LAMINECTOMY/DECOMPRESSION MICRODISCECTOMY Bilateral 10/25/2017   Procedure: LUMBAR 2-3, LUMBAR 3-4 DECOMPRESSION TIME REQUESTED 4.5 HOURS;  Surgeon: Phylliss Bob, MD;  Location: [facility];  Service: Orthopedics;  Laterality: Bilateral;   MASTECTOMY  1993?   bilateral mastectomy   ORIF ANKLE FRACTURE Left 07/30/2017   Procedure: LEFT OPEN REDUCTION INTERNAL FIXATION (ORIF) ANKLE FRACTURE WITH SYDESMOTIC FIXATION;  Surgeon: Dorna Leitz, MD;  Location: [facility];  Service: Orthopedics;  Laterality: Left;   TONSILLECTOMY  1949   TOTAL HIP ARTHROPLASTY  04/2006   right hip    TOTAL HIP REVISION  06/17/2012   "right" (06/17/2012)   TOTAL HIP REVISION  06/17/2012   Procedure: TOTAL HIP REVISION;  Surgeon: Kerin Salen, MD;  Location: [facility];  Service: Orthopedics;  Laterality: Right;    FAMHx:  Family History  Problem Relation Age of Onset   Heart failure Father        deceased age 53   Diabetes Father    Alcohol abuse Father    Breast cancer Mother        deceased at age 27 secondary to  breast cancer   Liver disease Sister        Liver failure--deceased   Dementia Maternal Grandmother    Colon cancer Neg Hx    Esophageal cancer Neg Hx    Rectal cancer Neg Hx    Stomach cancer Neg Hx     SOCHx:   reports that she quit smoking about 46 years ago. Her smoking use included cigarettes. She has a 5.00 pack-year smoking history. She has never used smokeless tobacco. She reports current alcohol use. She reports that she does not use drugs.  ALLERGIES:  Allergies  Allergen Reactions   Amlodipine Swelling    Swelling    Sulfonamide Derivatives Swelling    REACTION: Swelling of the face; "eyes swelled shut"   Myrbetriq [Mirabegron]     Facial swelling, sob   Chocolate Other (See Comments)    Sinus problems    ROS: Pertinent items noted in HPI and remainder of comprehensive ROS otherwise negative.  HOME MEDS: Current Outpatient Medications  Medication Sig Dispense Refill   acetaminophen (TYLENOL) 500 MG tablet Take 1,000 mg by mouth in the morning, at noon, and at bedtime. Take with Gabapetin     alendronate (FOSAMAX) 70 MG tablet Take 1 tablet (70 mg total) by mouth every 7 (seven) days. Take with a full glass of water on an empty stomach. 12 tablet 4   bisoprolol (ZEBETA) 10 MG tablet Take 2 tablets (20 mg total) by mouth daily. 180 tablet 3   famotidine (PEPCID) 20 MG tablet Take 1 tablet (20 mg total) by mouth 2 (two) times daily. 180 tablet 1   fexofenadine (ALLEGRA) 180 MG tablet Take 180 mg by mouth daily as needed. For seasonal allergies     FLUoxetine (PROZAC) 10 MG capsule TAKE 1 CAPSULE BY MOUTH EVERY DAY 90 capsule 1   fluticasone (FLONASE) 50 MCG/ACT nasal spray USE 1 SPRAY IN EACH NOSTRIL EVERY DAY 32 g 1   furosemide (LASIX) 40 MG tablet Take 1 tablet (40 mg total) by mouth 2 (two) times daily. 180 tablet 3   gabapentin (NEURONTIN) 100 MG capsule TAKE 1 CAPSULE BY MOUTH THREE TIMES A DAY 270 capsule 2   hydrALAZINE (APRESOLINE) 25 MG tablet TAKE 1 TABLET  IN THE MORNING  AND AT BEDTIME 180 tablet 1   Lidocaine 4 % PTCH Apply 1 patch topically daily as needed (pain).  Omega-3 Fatty Acids (FISH OIL) 1200 MG CAPS Take 1 capsule by mouth daily.     Polyethyl Glycol-Propyl Glycol (SYSTANE OP) Apply 1 drop to eye daily as needed (dry eyes).     simvastatin (ZOCOR) 10 MG tablet Take 1 tablet (10 mg total) by mouth daily. 90 tablet 0   valsartan (DIOVAN) 320 MG tablet TAKE 1 TABLET BY MOUTH EVERY DAY 90 tablet 3   No current facility-administered medications for this visit.    LABS/IMAGING: No results found for this or any previous visit (from the past 48 hour(s)). No results found.  WEIGHTS: Wt Readings from Last 3 Encounters:  03/25/21 233 lb 12.8 oz (106.1 kg)  03/01/21 233 lb (105.7 kg)  11/16/20 230 lb (104.3 kg)    VITALS: BP (!) 168/92   Pulse 72   Ht '5\' 6"'$  (1.676 m)   Wt 233 lb 12.8 oz (106.1 kg)   LMP 07/11/1991   SpO2 97%   BMI 37.74 kg/m   EXAM: General appearance: alert, no distress, and morbidly obese Neck: no carotid bruit, no JVD, and thyroid not enlarged, symmetric, no tenderness/mass/nodules Lungs: clear to auscultation bilaterally Heart: regular rate and rhythm, S1, S2 normal, no murmur, click, rub or gallop Abdomen: soft, non-tender; bowel sounds normal; no masses,  no organomegaly Extremities: extremities normal, atraumatic, no cyanosis or edema Pulses: 2+ and symmetric Skin: Skin color, texture, turgor normal. No rashes or lesions Neurologic: Grossly normal : Pleasant  EKG: Normal sinus rhythm at 72, low voltage QRS-personally reviewed  ASSESSMENT: Chronic diastolic CHF Palpitations-PSVT, PACs, PVCs Chest pressure - low risk Myoview stress test, EF 64% (2017) Dyspnea on exertion Essential hypertension Morbid obesity Family history of coronary disease OSA on BIPAP CKD2  PLAN:  1.   Mrs Mcconaha appears to be clinically compensated with diastolic congestive heart failure.  She denies any chest pain  or worsening shortness of breath.  Blood pressure in the office is typically elevated however home blood pressure readings are around 130/70.  Cholesterol has been controlled.  She is compliant with BiPAP.  Has had some mild improvement in her CKD.  No changes to her medicines today.  Plan follow-up annually or sooner as necessary.  Pixie Casino, MD, [facility], [place] Director of the Advanced Lipid Disorders &  Cardiovascular Risk Reduction Clinic Diplomate of the American Board of Clinical Lipidology Attending Cardiologist  Direct Dial: [phone]  Fax: [phone]  Website:  [email or web address] 03/25/2021, 12:26 PM

## 2021-03-25 NOTE — Patient Instructions (Signed)
Medication Instructions:  Your physician recommends that you continue on your current medications as directed. Please refer to the Current Medication list given to you today.  *If you need a refill on your cardiac medications before your next appointment, please call your pharmacy*   Follow-Up: At [facility], you and your health needs are our priority.  As part of our continuing mission to provide you with exceptional heart care, we have created designated Provider Care Teams.  These Care Teams include your primary Cardiologist (physician) and Advanced Practice Providers (APPs -  Physician Assistants and Nurse Practitioners) who all work together to provide you with the care you need, when you need it.  We recommend signing up for the patient portal called "MyChart".  Sign up information is provided on this After Visit Summary.  MyChart is used to connect with patients for Virtual Visits (Telemedicine).  Patients are able to view lab/test results, encounter notes, upcoming appointments, etc.  Non-urgent messages can be sent to your provider as well.   To learn more about what you can do with MyChart, go to [email or web address].    Your next appointment:   12 months with Dr. Debara Pickett

## 2021-03-28 ENCOUNTER — Encounter: Payer: Self-pay | Admitting: Family

## 2021-03-29 ENCOUNTER — Other Ambulatory Visit: Payer: Self-pay

## 2021-03-29 DIAGNOSIS — Z1211 Encounter for screening for malignant neoplasm of colon: Secondary | ICD-10-CM

## 2021-03-29 NOTE — Telephone Encounter (Signed)
Do you know anything about her cologard?

## 2021-03-30 ENCOUNTER — Other Ambulatory Visit: Payer: Self-pay | Admitting: Internal Medicine

## 2021-04-13 DIAGNOSIS — Z23 Encounter for immunization: Secondary | ICD-10-CM | POA: Diagnosis not present

## 2021-04-27 ENCOUNTER — Other Ambulatory Visit: Payer: Self-pay | Admitting: Family

## 2021-05-12 ENCOUNTER — Telehealth: Payer: Self-pay

## 2021-05-12 NOTE — Telephone Encounter (Signed)
Received from pt, via [place], [place] handicap placard form for renewal along with a SASE to mail it to [place] and a check for processing made out to [place]; however, pt did not sign the form.  All items have been placed in provider's tray for pick up by CMA.

## 2021-05-13 NOTE — Telephone Encounter (Signed)
Per patient ok to mail back to home address

## 2021-05-13 NOTE — Telephone Encounter (Signed)
Lvm for patient to be aware we do not submit this information she will need to mail it herself or take to [facility]. She did not sign the form so she will need it back  and sign in order for them to process.   Lvm for patient to let me know if she can pick up of do we mail back to her,

## 2021-05-18 NOTE — Telephone Encounter (Signed)
Form mailed

## 2021-05-20 ENCOUNTER — Encounter: Payer: Self-pay | Admitting: Family

## 2021-05-23 NOTE — Telephone Encounter (Signed)
Have we received this? Please see mychart.

## 2021-06-03 ENCOUNTER — Other Ambulatory Visit: Payer: Self-pay | Admitting: Family

## 2021-06-22 ENCOUNTER — Other Ambulatory Visit: Payer: Self-pay | Admitting: Family

## 2021-07-21 ENCOUNTER — Ambulatory Visit (INDEPENDENT_AMBULATORY_CARE_PROVIDER_SITE_OTHER): Payer: Medicare Other | Admitting: Pharmacist

## 2021-07-21 DIAGNOSIS — I509 Heart failure, unspecified: Secondary | ICD-10-CM

## 2021-07-21 DIAGNOSIS — N1832 Chronic kidney disease, stage 3b: Secondary | ICD-10-CM

## 2021-07-21 DIAGNOSIS — E785 Hyperlipidemia, unspecified: Secondary | ICD-10-CM

## 2021-07-21 DIAGNOSIS — M8580 Other specified disorders of bone density and structure, unspecified site: Secondary | ICD-10-CM

## 2021-07-21 DIAGNOSIS — I10 Essential (primary) hypertension: Secondary | ICD-10-CM

## 2021-07-21 DIAGNOSIS — F32A Depression, unspecified: Secondary | ICD-10-CM

## 2021-07-21 MED ORDER — VALSARTAN 320 MG PO TABS: 320.0000 mg | ORAL_TABLET | Freq: Every day | ORAL | 1 refills | Status: DC

## 2021-07-21 MED ORDER — FAMOTIDINE 20 MG PO TABS: 20.0000 mg | ORAL_TABLET | Freq: Two times a day (BID) | ORAL | 1 refills | Status: DC

## 2021-07-21 MED ORDER — GABAPENTIN 100 MG PO CAPS: ORAL_CAPSULE | ORAL | 1 refills | Status: DC

## 2021-07-21 MED ORDER — ALENDRONATE SODIUM 70 MG PO TABS: 70.0000 mg | ORAL_TABLET | ORAL | 1 refills | Status: DC

## 2021-07-21 MED ORDER — HYDRALAZINE HCL 25 MG PO TABS: 25.0000 mg | ORAL_TABLET | Freq: Two times a day (BID) | ORAL | 1 refills | Status: DC

## 2021-07-21 MED ORDER — FLUOXETINE HCL 10 MG PO CAPS
10.0000 mg | ORAL_CAPSULE | Freq: Every day | ORAL | 1 refills | Status: DC
Start: 2021-07-21 — End: 2021-11-09

## 2021-07-21 MED ORDER — SIMVASTATIN 10 MG PO TABS: 10.0000 mg | ORAL_TABLET | Freq: Every day | ORAL | 1 refills | Status: DC

## 2021-07-21 NOTE — Chronic Care Management (AMB) (Signed)
Chronic Care Management Pharmacy Note  07/21/2021 Name:  Michel Eskelson MRN:  [id number] DOB:  [birth year]  Summary: Renal function improved in August. Continue to monitor (if < 35 mL/min consider alternative osteoporosis therapy)  Blood pressure continues to be at goal with home readings but high in the office - white coat syndrome.  Patient has switched to [facility] for 2023. Assisted in sending new prescription to [facility] mail order.  Recommend check Hep C, BMP and lipid at next [place] 09/01/2021 Discussed Health Maintenance - patient has appointment next week for COVID bivalent booster.  Confirmed patient has Cologard - reminded to complete  Subjective: Talayeh Bruinsma is an 81 y.o. year old female who is a primary patient of Debbrah Alar, NP.  The CCM team was consulted for assistance with disease management and care coordination needs.    Engaged with patient by telephone for follow up visit in response to provider referral for pharmacy case management and/or care coordination services.   Consent to Services:  The patient was given information about Chronic Care Management services, agreed to services, and gave verbal consent prior to initiation of services.  Please see initial visit note for detailed documentation.   Patient Care Team: Debbrah Alar, NP as PCP - General (Internal Medicine) Debara Pickett Nadean Corwin, MD as PCP - Cardiology (Cardiology) Volanda Napoleon, MD as Consulting Physician (Hematology and Oncology) Marygrace Drought, MD as Consulting Physician (Ophthalmology) Frederik Pear, MD as Consulting Physician (Orthopedic Surgery) Pixie Casino, MD as Consulting Physician (Cardiology) Troy Sine, MD as Consulting Physician (Cardiology) Phylliss Bob, MD as Consulting Physician (Orthopedic Surgery) Cherre Robins, [place] (Pharmacist)  Recent office visits: 03/01/2021 - Int Med (O'Sullican, NP) F/U HTN and CHF. No med changes. Ordered  Cologuard. 11/16/2020 - PCP Inda Castle) Follow up. Noted fall on stairs with skin tear. BMP checked - Potassium at 5.1 - high end of normal. Stopped potassium supplement. No other med changes.    Recent consult visits: 03/25/2021 - Cardio (Dr Debara Pickett) Seen for palpitation. No medication changes noted.    Hospital visits: None in previous 6 months   Objective:  Lab Results  Component Value Date   CREATININE 1.44 (H) 02/16/2021   CREATININE 1.85 (H) 11/16/2020   CREATININE 1.64 (H) 10/13/2020    Lab Results  Component Value Date   HGBA1C 5.7 (H) 05/04/2020   Last diabetic Eye exam: No results found for: HMDIABEYEEXA  Last diabetic Foot exam: No results found for: HMDIABFOOTEX      Component Value Date/Time   CHOL 167 05/04/2020 1402   TRIG 94 05/04/2020 1402   HDL 79 05/04/2020 1402   CHOLHDL 2.1 05/04/2020 1402   VLDL 14.2 12/25/2018 0854   LDLCALC 70 05/04/2020 1402   LDLDIRECT 110.4 12/13/2006 0900    Hepatic Function Latest Ref Rng & Units 08/11/2020 05/04/2020 07/22/2019  Total Protein 6.0 - 8.3 g/dL 7.0 6.9 -  Albumin 3.5 - 5.2 g/dL 4.3 - 4.2  AST 0 - 37 U/L 18 17 -  ALT 0 - 35 U/L 9 10 -  Alk Phosphatase 39 - 117 U/L 69 - -  Total Bilirubin 0.2 - 1.2 mg/dL 0.6 0.7 -  Bilirubin, Direct 0.0 - 0.3 mg/dL - - -    Lab Results  Component Value Date/Time   TSH 0.99 09/22/2015 11:56 AM   TSH 1.71 12/09/2014 03:43 PM    CBC Latest Ref Rng & Units 08/11/2020 05/04/2020 06/20/2019  WBC 4.0 - 10.5 K/uL 8.7 10.5  7.9  Hemoglobin 12.0 - 15.0 g/dL 10.3(L) 10.8(L) 10.6(L)  Hematocrit 36.0 - 46.0 % 31.3(L) 32.3(L) 31.9(L)  Platelets 150.0 - 400.0 K/uL 279.0 303 249.0    Lab Results  Component Value Date/Time   VD25OH 65.63 07/22/2019 11:30 AM   VD25OH 89.96 12/26/2018 11:37 AM    Clinical ASCVD: No  The 10-year ASCVD risk score (Arnett DK, et al., 2019) is: 45.6%   Values used to calculate the score:     Age: 81 years     Sex: Female     Is Non-Hispanic African  American: No     Diabetic: No     Tobacco smoker: No     Systolic Blood Pressure: 160 mmHg     Is BP treated: Yes     HDL Cholesterol: 79 mg/dL     Total Cholesterol: 167 mg/dL    Other: (CHADS2VASc if Afib, PHQ9 if depression, MMRC or CAT for COPD, ACT, DEXA)  Social History   Tobacco Use  Smoking Status Former   Packs/day: 0.50   Years: 10.00   Pack years: 5.00   Types: Cigarettes   Quit date: 07/14/1974   Years since quitting: 47.0  Smokeless Tobacco Never  Tobacco Comments   quit in 1976 smoked for approx. 10 years   BP Readings from Last 3 Encounters:  03/25/21 (!) 168/92  03/01/21 (!) 172/74  11/16/20 (!) 150/75   Pulse Readings from Last 3 Encounters:  03/25/21 72  03/01/21 74  11/16/20 74   Wt Readings from Last 3 Encounters:  03/25/21 233 lb 12.8 oz (106.1 kg)  03/01/21 233 lb (105.7 kg)  11/16/20 230 lb (104.3 kg)    Assessment: Review of patient past medical history, allergies, medications, health status, including review of consultants reports, laboratory and other test data, was performed as part of comprehensive evaluation and provision of chronic care management services.   SDOH:  (Social Determinants of Health) assessments and interventions performed:    CCM Care Plan  Allergies  Allergen Reactions   Amlodipine Swelling    Swelling    Sulfonamide Derivatives Swelling    REACTION: Swelling of the face; "eyes swelled shut"   Myrbetriq [Mirabegron]     Facial swelling, sob   Chocolate Other (See Comments)    Sinus problems    Medications Reviewed Today     Reviewed by Cherre Robins, RPH-CPP (Pharmacist) on 07/21/21 at 1137  Med List Status: <None>   Medication Order Taking? Sig Documenting Provider Last Dose Status Informant  acetaminophen (TYLENOL) 500 MG tablet [id number] Yes Take 1,000 mg by mouth in the morning, at noon, and at bedtime. Take with [facility] [provider] Taking Active   alendronate (FOSAMAX) 70 MG tablet  [id number] Yes TAKE 1 TABLET EVERY 7 DAYS . TAKE WITH A FULL GLASS OF WATER ON AN EMPTY STOMACH Debbrah Alar, NP Taking Active            Med Note Antony Contras, Adria Dill Jul 21, 2021 11:29 AM) On Wednesdays  bisoprolol (ZEBETA) 10 MG tablet [id number] Yes TAKE 2 TABLETS (20 MG TOTAL) BY MOUTH DAILY.  Patient taking differently: Take 10 mg by mouth in the morning and at bedtime.   Pixie Casino, MD Taking Active   famotidine (PEPCID) 20 MG tablet [id number] Yes Take 1 tablet (20 mg total) by mouth 2 (two) times daily. Debbrah Alar, NP Taking Active            Med Note (TORRENCE, JASMINE N  Fri Mar 25, 2021 12:12 PM)    fexofenadine (ALLEGRA) 180 MG tablet [id number] Yes Take 180 mg by mouth daily as needed. For seasonal allergies [provider] Taking Active Self  FLUoxetine (PROZAC) 10 MG capsule [id number] Yes TAKE 1 CAPSULE EVERY DAY Debbrah Alar, NP Taking Active   fluticasone (FLONASE) 50 MCG/ACT nasal spray [id number] Yes USE 1 SPRAY IN EACH NOSTRIL EVERY DAY  Patient taking differently: Place 1 spray into both nostrils daily as needed.   Debbrah Alar, NP Taking Active Self  furosemide (LASIX) 40 MG tablet [id number] Yes Take 1 tablet (40 mg total) by mouth 2 (two) times daily. Almyra Deforest, [place] Taking Active   gabapentin (NEURONTIN) 100 MG capsule [id number] Yes TAKE 1 CAPSULE BY MOUTH THREE TIMES A Charlton Haws, NP Taking Active   hydrALAZINE (APRESOLINE) 25 MG tablet [id number] Yes TAKE 1 TABLET IN THE MORNING  AND AT BEDTIME Debbrah Alar, NP Taking Active   Lidocaine 4 % PTCH [id number] Yes Apply 1 patch topically daily as needed (pain). [provider] Taking Active Self  Omega-3 Fatty Acids (FISH OIL) 1200 MG CAPS [id number] Yes Take 1 capsule by mouth daily. [provider] Taking Active   Polyethyl Glycol-Propyl Glycol (SYSTANE OP) [id number] Yes Apply 1 drop to eye daily as needed (dry eyes). [provider] Taking  Active Self  simvastatin (ZOCOR) 10 MG tablet [id number] Yes TAKE 1 TABLET EVERY DAY Debbrah Alar, NP Taking Active   valsartan (DIOVAN) 320 MG tablet [id number] Yes TAKE 1 TABLET BY MOUTH EVERY DAY Pixie Casino, MD Taking Active             Patient Active Problem List   Diagnosis Date Noted   Seasonal allergies 03/03/2021   Skin abrasion 11/16/2020   Depression 11/16/2020   Stage 3b chronic kidney disease ([place]) 07/24/2019   Hypervitaminosis D 09/18/2018   Traumatic closed displaced fracture of lateral malleolus of left fibula, initial encounter 07/30/2017   Low back pain 07/30/2017   Displaced fracture of distal end of left fibula 07/30/2017   Acute on chronic diastolic heart failure ([place]) 95/03/3266   Diastolic heart failure ([place]) 03/08/2016   OSA (obstructive sleep apnea) 03/08/2016   Palpitations 10/29/2015   Snoring 10/29/2015   Excessive daytime sleepiness 10/29/2015   PVC's (premature ventricular contractions) 10/29/2015   SVT (supraventricular tachycardia) ([place]) 10/29/2015   Onychomycosis 09/23/2015   Leukocytosis 12/16/2014   Osteoarthritis of both hands 08/29/2014   CHF (congestive heart failure) ([place]) 07/14/2014   Adjustment disorder 06/28/2014   GERD (gastroesophageal reflux disease) 03/25/2014   Morbid obesity due to excess calories ([place]) 03/05/2013   Dislocation of hip prosthesis ([place]) 07/26/2012   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   Hyperglycemia 12/28/2011   Abdominal pain 12/06/2011   Hyperparathyroidism ([place]) 10/10/2011   Overactive bladder 09/06/2011   Hip pain, right 06/05/2011   Borderline diabetes mellitus 03/06/2011   HYPERCALCEMIA 11/29/2009   Osteopenia 11/26/2009   ALLERGIC RHINITIS 09/24/2008   ADENOCARCINOMA, BREAST, HX OF 09/24/2008   Hyperlipemia 01/24/2008   ANEMIA 01/24/2008   Essential hypertension 12/13/2007    Immunization History  Administered Date(s) Administered   Influenza Split 03/22/2012    Influenza Whole 04/28/2008, 05/04/2009   Influenza, High Dose Seasonal PF 04/01/2015, 03/27/2016, 03/13/2017, 04/02/2018, 04/04/2019, 03/24/2020, 04/13/2021   Influenza,inj,Quad PF,6+ Mos 03/19/2013, 03/25/2014   PFIZER(Purple Top)SARS-COV-2 Vaccination 09/11/2019, 09/30/2019, 04/23/2020   Pneumococcal Conjugate-13 06/25/2013   Pneumococcal Polysaccharide-23 05/27/2001, 06/18/2012   Td 05/26/2008   Tdap 07/28/2020  Zoster Recombinat (Shingrix) 07/28/2020, 01/28/2021   Zoster, Live 12/01/2010    Conditions to be addressed/monitored: CHF, HTN, HLD, CKD Stage 3, Depression, GERD, and Osteopenia  Care Plan : General Pharmacy (Adult)  Updates made by Cherre Robins, RPH-CPP since 07/21/2021 12:00 AM     Problem: CHF; HTN; CKD; GERD: osteopenia; depression; HDL   Priority: High     Long-Range Goal: Pharmacy goals for chronic conditions and medication management   Start Date: 01/18/2021  This Visit's Progress: On track  Recent Progress: Not on track  Priority: High  Note:   Current Barriers:  Unable to maintain control of HTN, hypercalcemia, hyperkalemia Variable renal function - last Scr improved  Pharmacist Clinical Goal(s):  Over the next 90 days, patient will achieve control of HTN, hypercalcemia and hyperkalemia as evidenced by BP <130/80 and calcium and potassium to remain in therapeutic ranges maintain control of HDL and CHF as evidenced by LDL <100 and no exacerbations of CHF  Monitor renal function, avoid nephrotoxic medications and adjust medication doses as needed based on renal function  through collaboration with PharmD and provider.   Interventions: 1:1 collaboration with Debbrah Alar, NP regarding development and update of comprehensive plan of care as evidenced by provider attestation and co-signature Inter-disciplinary care team collaboration (see longitudinal plan of care) Comprehensive medication review performed; medication list updated in electronic medical  record    Hypertension / CHF / Edema: Not at goal; Ideally BP goal <130/80 due to renal function however patient does have some dizziness when BP is <130/80 Current regimen:  Bisoprolol 72m - take 1 tablet twice a day OR 2 tablets once daily Hydralazine 259m- take 1 tablet twice daily Valsartan 32012m take 1 tablet each evening Furosemide 61m8mtake 1 tablet twice a day Patient has failed these meds in the past: losartan (efficacy), amlodipine (swelling), clonidine (hypotension), hctz (hyponatremia) Checking BP at home daily - reports BP today was 127/79  Patient appears to have element of white coat syndrome confirmed in past with validation in office that home blood pressure cuff was accurate.  CHF type: Diastolic or HFpEF Last ejection fraction was 60-65% on 09/06/2020 NYHA Class: III (marked limitation of activity) AHA HF Stage: C (Heart disease and symptoms present) Patient is checking weight daily. Reports weight has been stable around 232 lbs.  Reports improved breathing and less edema over the last 2 months; Sometimes has edema in afternoon but resolves with elevating legs.  Interventions: Discussed blood pressure goal  Due to have BMP rechecked at next appt with PCP in Feb 2023.   Recommended continue to check blood pressure daily, document, and provide at future appointments Ensure daily salt intake < 2300 mg/day Continue current medications for blood pressure, heart and swelling Continue to monitor weight daily Discussed signs and symptoms of CHF exacerbation - weight gain, SOB, abdominal fullness, swelling in legs or abdomen, Fatigue and weakness, changes in ability to perform usual activities, persistent cough or wheezing with white or pink blood-tinged mucus, nausea and lack of appetite Continue to weigh daily - report weight gain of more than 3 lbs in 24 hours or 5 lbs in 1 week.  Hyperlipidemia Controlled; LDL goal < 100 Current regimen:  Fish Oil 1200mg3mly each  morning Simvastatin 10mg 5my nightly Patient has failed these meds in past: None noted Interventions: Discussed LDL goal Maintain cholesterol medication regimen.  Last lipid panel 04/2020 - due to have rechecked at next PCP visit.   Pre-Diabetes Controlled; maintain  A1c goal <6.5% Current regimen:  Diet and exercise management   Interventions: Discussed a1c goal Continue to do exercises from physical therapy every day  Osteopenia / low bone density:  Goal: prevent falls and fracutres Last DEXA Scan: 05/13/2020             T-Score femoral neck: -1.8 (-1.9 in 2018)             T-Score forearm radius: -1.8 (-1.2 in 2018) History of hypercalcemia and elevated PTH. Evaluation from endocrinology 2019, noted that elevated PTH could be due to combo primary dysfunction in PTH regulation and secondary to CKD. Recommended consider referral to nephrology to monitor.  Also has history of elevated serum vitamin D so is not taking any calcium or vitamin supplementation  Last serum creatinine was 1.44 Last eGFR was 34.98 Estimated CrCL = 39 ml/min (using Adjusted body weight for patient's with BMI >25)  Current regimen:  Alendronate 18m - take 1 tablet once per WEEK. Take on empty stomach before eating, drinking or taking other medications. Take with a full glass of water. Do not lie down or recline for at least 30 minutes after taking alendronate.   Interventions: Reviewed how to take alendronate Discussed fall prevention  Continue to monitor renal function. If CrCl drops <35, recommend discontinue alendronate since not recommenced for CrCL <35 mL/min.  Continue to use cane and walker regularly  Depression:  Goal: improved symptoms of depression Controlled Current regimen:  Fluoxetine 175mdaily in morning Patient has failed these meds in past: None noted  Interventions:  Continue current therapy  Decreased kidney function:  Goal: prevent declined in kidney function and avoid  nephrotoxic medication Creatinine Clearance (Cockcroft-Gault Equation) from [email or web address] 02/16/2021 39 mL/min (Creatinine clearance modified for overweight patient, using adjusted body weight of 78 kg (171 lbs). Current regimen:  none   Interventions: Continue to avoid NSAIDs like ibuprofen (can affect kidneys and increase blood pressure) Due to recheck kidney function at next PCP appointment Consider referral to nephrology  GERD:  Goal: control reflux symptoms Current therapy Famotidine 2046mwice daily Patient has failed these meds in past: omeprazole (concern with kidneys)  Triggers: Fast food, greasy food Interventions:  Continue current medications  Osteoarthritis / Back Pain: Goal: manage back pain  Current therapy: Gabapentin 100m64mree times daily Acetaminophen 500mg41make 2 tablets = 1000mg 82me times daily  Patient has failed these meds in past: None noted Interventions: (addressed at previous visits) Continue current medications  Health Maintenance  Goal: complete health maintenance screenings/vaccinations Due to received COVID bivalent booster Due to have Hep C screening Interventions: Discussed COVID booster and Hep C screening Patient self care activities - Over the next 180 days, patient will: Will received COVID booster / bivalent next week - appointment [facility] Plans to have Hep C screening in Feb 2023  when sees PCP next   Medication management [facility]; [facility] Comprehensive medication review performed. Continue current medication management strateg Focus on medication adherence by filling and taking medications appropriately  Sendtupdated prescription to new mail order [facility] - fluoxetine, valsartan, simvastatin, hydralazine, famotidine, alendronate and gabapentin Take medications as prescribed Report any questions or  concerns to PharmD and/or provider(s)  Patient Goals/Self-Care Activities Over the next 90 days, patient will:  take medications as prescribed, check blood pressure daily , document, and provide at future appointments, weigh daily,  and contact provider if weight gain of > 3 lbs in 24 hours and > 5 lbs in 1 week, engage in dietary modifications by limiting intake of sodium to < 2314m , and continue to do exercises daily from physical therapy.   Follow Up Plan: Telephone follow up appointment with care management team member scheduled for:  3 months; sees PCP 09/01/2021          Medication Assistance: None required.  Patient affirms current coverage meets needs.  Patient's preferred pharmacy is:  [id number] [place] [place] - [address]: [phone]: [phone] - [id number] [id number]: [phone]: [phone] pill box? No -   Pt endorses 100% compliance  Follow Up:  Patient agrees to Care Plan and Follow-up.  Plan: Telephone follow up appointment with care management team member scheduled for:  10/10/2021  TaCherre RobinsPharmD Clinical Pharmacist [facility]Texas [facility]

## 2021-07-21 NOTE — Patient Instructions (Addendum)
Mrs. Amyx,  It was a pleasure speaking with you today.  I have attached a summary of our visit today and information about your health goals.   Our next appointment is by telephone on 10/10/2021 at 11:00am  Please call the care guide team at [phone] if you need to cancel or reschedule your appointment.   If you have any questions or concerns, please feel free to contact me either at the phone number below or with a MyChart message.   Keep up the good work!  Cherre Robins, PharmD Clinical Pharmacist [facility] [phone] (direct line)  [phone] (main office number)  CARE PLAN ENTRY (Updated 07/21/2021) Hypertension / heart failure / lower leg swelling: BP Readings from Last 3 Encounters:  03/25/21 (!) 168/92  03/01/21 (!) 172/74  11/16/20 (!) 150/75   Pharmacist Clinical Goal(s): Over the next 180 days, patient will work with PharmD and providers to achieve BP goal <130/80 Current regimen:  Bisoprolol 10mg  - take 1 tablet twice a day OR 2 tablets once daily Hydralazine 25mg  - take 1 tablet twice daily Valsartan 320mg  - take 1 tablet each evening Furosemide 40mg  - take 1 tablet twice a day Interventions: Discussed blood pressure goal  Discussed signs and symptoms of CHF exacerbation Patient self care activities - Over the next 180 days, patient will: Check blood pressure daily, document, and provide at future appointments Ensure daily salt intake < 2300 mg/day Continue current medications for blood pressure, heart and swelling Discussed signs and symptoms of CHF exacerbation - weight gain, shortness of breath,  abdominal fullness, swelling in legs or abdomen, Fatigue and weakness, changes in ability to perform usual activities, persistent cough or wheezing with white or pink blood-tinged mucus, nausea and lack of appetite. Contact either PCP or cardiologist if you experience any of these symptoms.  Continue to weigh daily - report  weight gain of more than 3 lbs in 24 hours or 5 lbs in 1 week.  Hyperlipidemia Lab Results  Component Value Date/Time   LDLCALC 70 05/04/2020 02:02 PM   LDLDIRECT 110.4 12/13/2006 09:00 AM   Pharmacist Clinical Goal(s): Over the next 180 days, patient will work with PharmD and providers to maintain LDL goal < 100 Current regimen:  Fish Oil 1200mg  daily each morning Simvastatin 10mg  daily night  Interventions: Discussed LDL goal  Patient self care activities - Over the next 180 days, patient will: Maintain cholesterol medication regimen.  Check lipid panel with next labs  Pre-Diabetes Lab Results  Component Value Date/Time   HGBA1C 5.7 (H) 05/04/2020 02:02 PM   HGBA1C 5.9 12/25/2018 08:54 AM   Pharmacist Clinical Goal(s): Over the next 180 days, patient will work with PharmD and providers to maintain A1c goal <6.5% Current regimen:  Diet and exercise management   Interventions: Discussed a1c goal Patient self care activities - Over the next 180 days, patient will: Maintain a1c less than 6.5% Continue to do exercises from physical therapy every day  Osteopenia / low bone density:  Pharmacist Clinical Goal(s): Over the next 180 days, patient will work with PharmD and providers to prevent falls and fracutres Current regimen:  Alendronate 70mg  - take 1 tablet once per WEEK. Take on empty stomach before eating, drinking or taking other medications. Take with a full glass of water. Do not lie down or recline for at least 30 minutes after taking alendronate.   Interventions: Reviewed how to take alendronate Discussed fall prevention tips Patient self care activities - Over  the next 180 days, patient will: Continue to take alendronate weekly Continue to use cane and walker regularly Practice fall prevention techniques  Decreased kidney function:  Pharmacist Clinical Goal(s): Over the next 180 days, patient will work with PharmD and providers to prevent declined in kidney  function Current regimen:  none   Interventions: Discussed reason for avoiding NSAIDs like ibuprofen (can affect kidneys and increase blood pressure) Consider rechecking kidney function  Patient self care activities - Over the next 180 days, patient will: Avoid ibuprofen and naproxen   Health Maintenance  Pharmacist Clinical Goal(s) Over the next 180 days, patient will work with PharmD and providers to complete health maintenance screenings/vaccinations Interventions: Recommended patient receive Bivalent COVID booster Have Hepatitis C screening Verified patient received Cologard test - reminded to complete.  Patient self care activities - Over the next 180 days, patient will: Get bivalent COVID booster (has appointment at [facility] for next week - January 2023) Get Hepatis C screen with next labs Complete Cologard.   Medication management Pharmacist Clinical Goal(s): Over the next 180 days, patient will work with PharmD and providers to maintain optimal medication adherence Current pharmacy: [facility] (will be changing to [facility]) Interventions Comprehensive medication review performed. Continue current medication management strategy Assisted with transition over the [facility] ([place] mail order pharmacy) Sent updated prescriptions for fluoxetine, valsartan, simvastatin, hydralazine, famotidine, alendronate and gabapentin Patient self care activities - Over the next 180 days, patient will: Focus on medication adherence by filling and taking medications appropriately  Take medications as prescribed Report any questions or concerns to PharmD and/or provider(s)  Patient verbalizes understanding of instructions and care plan provided today and agrees to view in [place]. Active MyChart status confirmed with patient.

## 2021-08-09 DIAGNOSIS — F32A Depression, unspecified: Secondary | ICD-10-CM | POA: Diagnosis not present

## 2021-08-09 DIAGNOSIS — I509 Heart failure, unspecified: Secondary | ICD-10-CM

## 2021-08-09 DIAGNOSIS — I10 Essential (primary) hypertension: Secondary | ICD-10-CM | POA: Diagnosis not present

## 2021-08-09 DIAGNOSIS — E785 Hyperlipidemia, unspecified: Secondary | ICD-10-CM | POA: Diagnosis not present

## 2021-09-06 ENCOUNTER — Encounter: Payer: Self-pay | Admitting: Family

## 2021-09-06 ENCOUNTER — Ambulatory Visit: Payer: Medicare Other | Admitting: Family

## 2021-09-06 VITALS — BP 136/69 | HR 75 | Temp 99.1°F | Resp 16 | Ht 66.0 in | Wt 234.0 lb

## 2021-09-06 DIAGNOSIS — F32A Depression, unspecified: Secondary | ICD-10-CM

## 2021-09-06 DIAGNOSIS — D649 Anemia, unspecified: Secondary | ICD-10-CM | POA: Diagnosis not present

## 2021-09-06 DIAGNOSIS — R7303 Prediabetes: Secondary | ICD-10-CM | POA: Diagnosis not present

## 2021-09-06 DIAGNOSIS — G4733 Obstructive sleep apnea (adult) (pediatric): Secondary | ICD-10-CM

## 2021-09-06 DIAGNOSIS — M8580 Other specified disorders of bone density and structure, unspecified site: Secondary | ICD-10-CM

## 2021-09-06 DIAGNOSIS — N289 Disorder of kidney and ureter, unspecified: Secondary | ICD-10-CM | POA: Diagnosis not present

## 2021-09-06 DIAGNOSIS — N3281 Overactive bladder: Secondary | ICD-10-CM

## 2021-09-06 DIAGNOSIS — J302 Other seasonal allergic rhinitis: Secondary | ICD-10-CM

## 2021-09-06 DIAGNOSIS — I10 Essential (primary) hypertension: Secondary | ICD-10-CM

## 2021-09-06 DIAGNOSIS — I5032 Chronic diastolic (congestive) heart failure: Secondary | ICD-10-CM | POA: Diagnosis not present

## 2021-09-06 LAB — CBC WITH DIFFERENTIAL/PLATELET
Basophils Absolute: 0.1 10*3/uL (ref 0.0–0.1)
Basophils Relative: 0.6 % (ref 0.0–3.0)
Eosinophils Absolute: 0.1 10*3/uL (ref 0.0–0.7)
Eosinophils Relative: 0.8 % (ref 0.0–5.0)
HCT: 31.2 % — ABNORMAL LOW (ref 36.0–46.0)
Hemoglobin: 10.5 g/dL — ABNORMAL LOW (ref 12.0–15.0)
Lymphocytes Relative: 22.4 % (ref 12.0–46.0)
Lymphs Abs: 2 10*3/uL (ref 0.7–4.0)
MCHC: 33.7 g/dL (ref 30.0–36.0)
MCV: 100 fl (ref 78.0–100.0)
Monocytes Absolute: 0.9 10*3/uL (ref 0.1–1.0)
Monocytes Relative: 9.5 % (ref 3.0–12.0)
Neutro Abs: 6.1 10*3/uL (ref 1.4–7.7)
Neutrophils Relative %: 66.7 % (ref 43.0–77.0)
Platelets: 264 10*3/uL (ref 150.0–400.0)
RBC: 3.12 Mil/uL — ABNORMAL LOW (ref 3.87–5.11)
RDW: 13.7 % (ref 11.5–15.5)
WBC: 9.1 10*3/uL (ref 4.0–10.5)

## 2021-09-06 LAB — COMPREHENSIVE METABOLIC PANEL
ALT: 11 U/L (ref 0–35)
AST: 21 U/L (ref 0–37)
Albumin: 4.4 g/dL (ref 3.5–5.2)
Alkaline Phosphatase: 52 U/L (ref 39–117)
BUN: 48 mg/dL — ABNORMAL HIGH (ref 6–23)
CO2: 23 mEq/L (ref 19–32)
Calcium: 10.7 mg/dL — ABNORMAL HIGH (ref 8.4–10.5)
Chloride: 104 mEq/L (ref 96–112)
Creatinine, Ser: 1.63 mg/dL — ABNORMAL HIGH (ref 0.40–1.20)
GFR: 30.03 mL/min — ABNORMAL LOW (ref >60.00)
Glucose, Bld: 100 mg/dL — ABNORMAL HIGH (ref 70–99)
Potassium: 4.9 mEq/L (ref 3.5–5.1)
Sodium: 139 mEq/L (ref 135–145)
Total Bilirubin: 0.6 mg/dL (ref 0.2–1.2)
Total Protein: 7.3 g/dL (ref 6.0–8.3)

## 2021-09-06 LAB — IRON: Iron: 73 ug/dL (ref 42–145)

## 2021-09-06 LAB — HEMOGLOBIN A1C: Hgb A1c MFr Bld: 5.9 % (ref 4.6–6.5)

## 2021-09-06 NOTE — Assessment & Plan Note (Signed)
Mood is stable.  Reports that she does not take every day. Will try taking daily at bedtime.

## 2021-09-06 NOTE — Assessment & Plan Note (Signed)
Reports stable on allegra, continue same.

## 2021-09-06 NOTE — Assessment & Plan Note (Signed)
Continues bipap.  Reinforced compliance.

## 2021-09-06 NOTE — Assessment & Plan Note (Signed)
Lab Results  Component Value Date   HGBA1C 5.7 (H) 05/04/2020   HGBA1C 5.9 12/25/2018   HGBA1C 6.2 05/01/2017   Lab Results  Component Value Date   LDLCALC 70 05/04/2020   CREATININE 1.44 (H) 02/16/2021   Check follow up A1C.

## 2021-09-06 NOTE — Progress Notes (Addendum)
Subjective:   By signing my name below, I, [name], attest that this documentation has been prepared under the direction and in the presence of Courtney Alar, NP. 09/06/2021    Patient ID: [name], female    DOB: Jun 27, 1941, 81 y.o.   MRN: [id number]  Chief Complaint  Patient presents with   Hypertension    Here for follow up   Pre-visit Screening Tool Documentation   Depression    Here for follow up    HPI Patient is in today for an office visit.  Patient reports falling on her hip however, reports no pain present in hip area.   Patient reports of receiving Covid-19 moderna vaccination 3 weeks ago.  A1C reports of higher blood sugar than normal.    Blood count is reportedly still low.   Patient continues to have edema located on the lower legs. Patient continues to take lasix to alleviate symptoms.  Patient doesn't take Prozac most days, reports to taking medication 3 or 4 days of the week. Patient reports drowsiness when taking the medication. When taken, patient reports taking Prozac in the mornings. Patient will take Prozac at night to alleviate negative symptoms.    Blood pressure- 320 mg valsartan daily PO and reports no new issues while taking it. Her leg swelling has not wrosened since her last visit. She contineues   Sleep- Patient reports taking off bipap during some nights. Patient is suggested to continue using bipap to help sleep apnea. Allergies- Patient continues to take OTC Allegra everyday to help allergy symptoms.  Bone- Patient continues to take Fosamax weekly.   Past Medical History:  Diagnosis Date   Allergic rhinitis    Ankle fracture    Left, [place]   Arthritis    "knuckles" (06/17/2012)   Borderline diabetes mellitus 03/06/2011   Breast cancer ([place])    "right" (06/17/2012)   Cataracts, bilateral    Dyspnea    with exertion   Exertional dyspnea    GERD (gastroesophageal reflux disease)    Hypertension    Iron  deficiency anemia    "hematologist watches it" (06/17/2012)   Obesity    OSA treated with BiPAP 03/08/2016   Osteoarthritis    severe right hip osteoarthritis-s/p hip replacement   Osteopenia 11/26/2009   Thyroid disorder    "sees endocrinologist yearly" (06/17/2012)    Past Surgical History:  Procedure Laterality Date   ANKLE FRACTURE SURGERY Right 01/01/14   Has pins. Procedure performed at [place]   "bilaterlly" (06/17/2012)   COLONOSCOPY W/ POLYPECTOMY     EYE SURGERY  2011   cataract removal both eyes   HIP CLOSED REDUCTION  07/26/2012   Procedure: CLOSED MANIPULATION HIP;  Surgeon: Nita Sells, MD;  Location: [facility];  Service: Orthopedics;  Laterality: Right;   LUMBAR LAMINECTOMY  10/26/2017   LUMBAR LAMINECTOMY/DECOMPRESSION MICRODISCECTOMY Bilateral 10/25/2017   Procedure: LUMBAR 2-3, LUMBAR 3-4 DECOMPRESSION TIME REQUESTED 4.5 HOURS;  Surgeon: Phylliss Bob, MD;  Location: [facility];  Service: Orthopedics;  Laterality: Bilateral;   MASTECTOMY  1993?   bilateral mastectomy   ORIF ANKLE FRACTURE Left 07/30/2017   Procedure: LEFT OPEN REDUCTION INTERNAL FIXATION (ORIF) ANKLE FRACTURE WITH SYDESMOTIC FIXATION;  Surgeon: Dorna Leitz, MD;  Location: [facility];  Service: Orthopedics;  Laterality: Left;   TONSILLECTOMY  1949   TOTAL HIP ARTHROPLASTY  04/2006   right hip    TOTAL HIP REVISION  06/17/2012   "right" (  06/17/2012)   TOTAL HIP REVISION  06/17/2012   Procedure: TOTAL HIP REVISION;  Surgeon: Kerin Salen, MD;  Location: [place];  Service: Orthopedics;  Laterality: Right;    Family History  Problem Relation Age of Onset   Heart failure Father        deceased age 31   Diabetes Father    Alcohol abuse Father    Breast cancer Mother        deceased at age 75 secondary to breast cancer   Liver disease Sister        Liver failure--deceased   Dementia Maternal Grandmother    Colon cancer Neg Hx    Esophageal cancer Neg Hx    Rectal  cancer Neg Hx    Stomach cancer Neg Hx     Social History   Socioeconomic History   Marital status: Widowed    Spouse name: Not on file   Number of children: Not on file   Years of education: Not on file   Highest education level: Not on file  Occupational History   Not on file  Tobacco Use   Smoking status: Former    Packs/day: 0.50    Years: 10.00    Pack years: 5.00    Types: Cigarettes    Quit date: 07/14/1974    Years since quitting: 47.1   Smokeless tobacco: Never   Tobacco comments:    quit in 1976 smoked for approx. 10 years  Vaping Use   Vaping Use: Never used  Substance and Sexual Activity   Alcohol use: Yes    Comment: wine on occasion. rare.    Drug use: No   Sexual activity: Never  Other Topics Concern   Not on file  Social History Narrative   Retired   The patient is married and has one child and two grandchildren that live in the area.     She drinks wine with dinner.  She quit smoking in 1976, smoked for   approximately 10 years.       Moved to [place] from [place], [place]         Social Determinants of Health   Financial Resource Strain: Low Risk    Difficulty of Paying Living Expenses: Not hard at all  Food Insecurity: Not on file  Transportation Needs: Not on file  Physical Activity: Sufficiently Active   Days of Exercise per Week: 7 days   Minutes of Exercise per Session: 30 min  Stress: Not on file  Social Connections: Not on file  Intimate Partner Violence: Not on file    Outpatient Medications Prior to Visit  Medication Sig Dispense Refill   acetaminophen (TYLENOL) 500 MG tablet Take 1,000 mg by mouth in the morning, at noon, and at bedtime. Take with Gabapetin     alendronate (FOSAMAX) 70 MG tablet Take 1 tablet (70 mg total) by mouth once a week. Take with a full glass of water on an empty stomach. 12 tablet 1   bisoprolol (ZEBETA) 10 MG tablet TAKE 2 TABLETS (20 MG TOTAL) BY MOUTH DAILY. (Patient taking differently: Take 10 mg by  mouth in the morning and at bedtime.) 180 tablet 3   famotidine (PEPCID) 20 MG tablet Take 1 tablet (20 mg total) by mouth 2 (two) times daily. 180 tablet 1   fexofenadine (ALLEGRA) 180 MG tablet Take 180 mg by mouth daily as needed. For seasonal allergies     FLUoxetine (PROZAC) 10 MG capsule Take 1 capsule (10 mg  total) by mouth daily. 90 capsule 1   fluticasone (FLONASE) 50 MCG/ACT nasal spray USE 1 SPRAY IN EACH NOSTRIL EVERY DAY (Patient taking differently: Place 1 spray into both nostrils daily as needed.) 32 g 1   furosemide (LASIX) 40 MG tablet Take 1 tablet (40 mg total) by mouth 2 (two) times daily. 180 tablet 3   gabapentin (NEURONTIN) 100 MG capsule TAKE 1 CAPSULE BY MOUTH THREE TIMES A DAY 270 capsule 1   hydrALAZINE (APRESOLINE) 25 MG tablet Take 1 tablet (25 mg total) by mouth in the morning and at bedtime. In the morning and at bedtime 180 tablet 1   Lidocaine 4 % PTCH Apply 1 patch topically daily as needed (pain).     Omega-3 Fatty Acids (FISH OIL) 1200 MG CAPS Take 1 capsule by mouth daily.     Polyethyl Glycol-Propyl Glycol (SYSTANE OP) Apply 1 drop to eye daily as needed (dry eyes).     simvastatin (ZOCOR) 10 MG tablet Take 1 tablet (10 mg total) by mouth daily. 90 tablet 1   valsartan (DIOVAN) 320 MG tablet Take 1 tablet (320 mg total) by mouth daily. 90 tablet 1   No facility-administered medications prior to visit.    Allergies  Allergen Reactions   Amlodipine Swelling    Swelling    Sulfonamide Derivatives Swelling    REACTION: Swelling of the face; "eyes swelled shut"   Myrbetriq [Mirabegron]     Facial swelling, sob   Chocolate Other (See Comments)    Sinus problems    ROS     Objective:    Physical Exam Constitutional:      General: She is not in acute distress.    Appearance: Normal appearance. She is not ill-appearing.  HENT:     Head: Normocephalic and atraumatic.     Right Ear: External ear normal.     Left Ear: External ear normal.  Eyes:      Extraocular Movements: Extraocular movements intact.     Pupils: Pupils are equal, round, and reactive to light.  Cardiovascular:     Rate and Rhythm: Normal rate and regular rhythm.     Pulses: Normal pulses.     Heart sounds: Normal heart sounds. No murmur heard.   No gallop.     Comments: Chronic venous stasis changes bilateral shins Pulmonary:     Effort: Pulmonary effort is normal. No respiratory distress.     Breath sounds: Normal breath sounds. No wheezing or rales.  Musculoskeletal:     Right lower leg: 3+ Edema present.     Left lower leg: 3+ Edema present.  Skin:    General: Skin is warm and dry.  Neurological:     Mental Status: She is alert and oriented to person, place, and time.  Psychiatric:        Judgment: Judgment normal.    BP 136/69 (BP Location: Right Arm, Patient Position: Sitting, Cuff Size: Large)    Pulse 75    Temp 99.1 F (37.3 C) (Oral)    Resp 16    Ht $R'5\' 6"'Rh$  (1.676 m)    Wt 234 lb (106.1 kg)    LMP 07/11/1991    SpO2 98%    BMI 37.77 kg/m  Wt Readings from Last 3 Encounters:  09/06/21 234 lb (106.1 kg)  03/25/21 233 lb 12.8 oz (106.1 kg)  03/01/21 233 lb (105.7 kg)    Diabetic Foot Exam - Simple   No data filed    Lab Results  Component Value Date   WBC 8.7 08/11/2020   HGB 10.3 (L) 08/11/2020   HCT 31.3 (L) 08/11/2020   PLT 279.0 08/11/2020   GLUCOSE 95 02/16/2021   CHOL 167 05/04/2020   TRIG 94 05/04/2020   HDL 79 05/04/2020   LDLDIRECT 110.4 12/13/2006   LDLCALC 70 05/04/2020   ALT 9 08/11/2020   AST 18 08/11/2020   NA 138 02/16/2021   K 3.9 02/16/2021   CL 101 02/16/2021   CREATININE 1.44 (H) 02/16/2021   BUN 29 (H) 02/16/2021   CO2 26 02/16/2021   TSH 0.99 09/22/2015   INR 1.00 10/18/2017   HGBA1C 5.7 (H) 05/04/2020    Lab Results  Component Value Date   TSH 0.99 09/22/2015   Lab Results  Component Value Date   WBC 8.7 08/11/2020   HGB 10.3 (L) 08/11/2020   HCT 31.3 (L) 08/11/2020   MCV 99.2 08/11/2020   PLT  279.0 08/11/2020   Lab Results  Component Value Date   NA 138 02/16/2021   K 3.9 02/16/2021   CO2 26 02/16/2021   GLUCOSE 95 02/16/2021   BUN 29 (H) 02/16/2021   CREATININE 1.44 (H) 02/16/2021   BILITOT 0.6 08/11/2020   ALKPHOS 69 08/11/2020   AST 18 08/11/2020   ALT 9 08/11/2020   PROT 7.0 08/11/2020   ALBUMIN 4.3 08/11/2020   CALCIUM 10.3 02/16/2021   ANIONGAP 10 10/18/2017   EGFR 32 (L) 10/13/2020   GFR 34.98 (L) 02/16/2021   Lab Results  Component Value Date   CHOL 167 05/04/2020   Lab Results  Component Value Date   HDL 79 05/04/2020   Lab Results  Component Value Date   LDLCALC 70 05/04/2020   Lab Results  Component Value Date   TRIG 94 05/04/2020   Lab Results  Component Value Date   CHOLHDL 2.1 05/04/2020   Lab Results  Component Value Date   HGBA1C 5.7 (H) 05/04/2020       Assessment & Plan:   Problem List Items Addressed This Visit       Unprioritized   Osteopenia (Chronic)    Continues fosamax once weekly.        Seasonal allergies    Reports stable on allegra, continue same.      Overactive bladder    Stable except for use of lasix.       OSA (obstructive sleep apnea)    Continues bipap.  Reinforced compliance.       Essential hypertension    BP Readings from Last 3 Encounters:  09/06/21 136/69  03/25/21 (!) 168/92  03/01/21 (!) 172/74  Stable on hydralazine bid, bisoprolol bid and losartan daily.       Depression    Mood is stable.  Reports that she does not take every day. Will try taking daily at bedtime.       Chronic diastolic heart failure (HCC)    Wt Readings from Last 3 Encounters:  09/06/21 234 lb (106.1 kg)  03/25/21 233 lb 12.8 oz (106.1 kg)  03/01/21 233 lb (105.7 kg)  Reports edema is stable. Continues lasix bid.       Borderline diabetes mellitus    Lab Results  Component Value Date   HGBA1C 5.7 (H) 05/04/2020   HGBA1C 5.9 12/25/2018   HGBA1C 6.2 05/01/2017   Lab Results  Component Value Date    LDLCALC 70 05/04/2020   CREATININE 1.44 (H) 02/16/2021  Check follow up A1C.  Relevant Orders   Hemoglobin A1c   ANEMIA    Lab Results  Component Value Date   WBC 8.7 08/11/2020   HGB 10.3 (L) 08/11/2020   HCT 31.3 (L) 08/11/2020   MCV 99.2 08/11/2020   PLT 279.0 08/11/2020  Check Iron/cbc.      Relevant Orders   CBC with Differential/Platelet   Iron   Other Visit Diagnoses     Renal insufficiency    -  Primary   Relevant Orders   Comp Met (CMET)        No orders of the defined types were placed in this encounter.   I, Nance Pear, NP, personally preformed the services described in this documentation.  All medical record entries made by the scribe were at my direction and in my presence.  I have reviewed the chart and discharge instructions (if applicable) and agree that the record reflects my personal performance and is accurate and complete. 09/06/2021   I,[name],acting as a scribe for Nance Pear, NP.,have documented all relevant documentation on the behalf of Nance Pear, NP,as directed by  Nance Pear, NP while in the presence of Nance Pear, NP.    Nance Pear, NP

## 2021-09-06 NOTE — Assessment & Plan Note (Signed)
Wt Readings from Last 3 Encounters:  09/06/21 234 lb (106.1 kg)  03/25/21 233 lb 12.8 oz (106.1 kg)  03/01/21 233 lb (105.7 kg)   Reports edema is stable. Continues lasix bid.

## 2021-09-06 NOTE — Assessment & Plan Note (Signed)
Stable except for use of lasix.

## 2021-09-06 NOTE — Assessment & Plan Note (Signed)
Lab Results  Component Value Date   WBC 8.7 08/11/2020   HGB 10.3 (L) 08/11/2020   HCT 31.3 (L) 08/11/2020   MCV 99.2 08/11/2020   PLT 279.0 08/11/2020   Check Iron/cbc.

## 2021-09-06 NOTE — Assessment & Plan Note (Signed)
BP Readings from Last 3 Encounters:  09/06/21 136/69  03/25/21 (!) 168/92  03/01/21 (!) 172/74   Stable on hydralazine bid, bisoprolol bid and losartan daily.

## 2021-09-06 NOTE — Assessment & Plan Note (Signed)
Continues fosamax once weekly.  ?

## 2021-09-06 NOTE — Patient Instructions (Signed)
Please complete lab work prior to leaving.   

## 2021-09-08 ENCOUNTER — Telehealth: Payer: Self-pay | Admitting: Family

## 2021-09-08 DIAGNOSIS — N289 Disorder of kidney and ureter, unspecified: Secondary | ICD-10-CM

## 2021-09-08 NOTE — Telephone Encounter (Signed)
[name], please let pt know that I have reviewed her most recent labs and based on current kidney function would like her to stop fosamax and instead we will work on getting her approved for Prolia injections twice a year.  I am also going to place a referral to the kidney doctor due to decreased kidney function. ? ?Theadora Rama, can you please initiate prolia approval? Tks ?

## 2021-09-08 NOTE — Telephone Encounter (Signed)
Patient advised of results, provider's advise, referral and pa process to start prolia.  ?

## 2021-09-08 NOTE — Telephone Encounter (Signed)
Prolia VOB initiated via [email or web address] ? ?New start ? ?

## 2021-09-17 NOTE — Telephone Encounter (Signed)
Prior auth required for PROLIA  PA PROCESS DETAILS: Effective 07/10/2021 if the patient is new to Prolia, Prior authorization and Step Therapy are required & not on file. Please go to [email or web address] or call [phone] to initiate the prior authorization. For exception to the policy please visit [email or web address] and review Policy Number [id number] 

## 2021-09-17 NOTE — Telephone Encounter (Signed)
Prior auth initiated via [facility] provider protal ?PA# [id number] - pending clinical review ? ? ?

## 2021-09-19 NOTE — Telephone Encounter (Signed)
Received another call from [place] at [facility] PA department. She needed the patient's diagnosis which was provided as Osteopenia.  ?She reports medication will not be cover under this diagnosis and that it will only be cover for osteoporosis. Having a low GFR qualifies her to get medication w/o having to try another injectable but will not be able to get due to diagnosis under her plan.  ?No need for peer to peer,per representative as medication will still not be cover due to diagnosis.  ?

## 2021-09-19 NOTE — Telephone Encounter (Signed)
See mychart.  

## 2021-09-19 NOTE — Telephone Encounter (Signed)
Received a call from [facility] representative, they are requesting patient to have had oral medication such as Fosamax and an injectable such as pamidronate (aredia) before taking prolia. They consider prolia to be a "step up medication" ?Case sent to medical board and Provider will need to call for peer to peer at [phone].  ?

## 2021-09-24 NOTE — Telephone Encounter (Addendum)
Coverage denied ?Criteria not met ? ? ?

## 2021-10-02 ENCOUNTER — Encounter: Payer: Self-pay | Admitting: Family

## 2021-10-03 MED ORDER — GABAPENTIN 100 MG PO CAPS: ORAL_CAPSULE | ORAL | 1 refills | Status: DC

## 2021-10-13 ENCOUNTER — Ambulatory Visit (INDEPENDENT_AMBULATORY_CARE_PROVIDER_SITE_OTHER): Payer: Medicare Other | Admitting: Pharmacist

## 2021-10-13 DIAGNOSIS — I10 Essential (primary) hypertension: Secondary | ICD-10-CM

## 2021-10-13 DIAGNOSIS — F32A Depression, unspecified: Secondary | ICD-10-CM

## 2021-10-13 DIAGNOSIS — I5032 Chronic diastolic (congestive) heart failure: Secondary | ICD-10-CM

## 2021-10-13 DIAGNOSIS — N1832 Chronic kidney disease, stage 3b: Secondary | ICD-10-CM

## 2021-10-13 NOTE — Chronic Care Management (AMB) (Signed)
? ? ?Chronic Care Management ?Pharmacy Note ? ?10/13/2021 ?Name:  [name] MRN:  [id number] DOB:  1940-11-05 ? ?Summary: ?Renal function decreased. Patient will see nephrologist in the next few week. Will continue monitor for need to adjust medications based in renal function. ?Blood pressure continues to be at goal with home readings and last office blood pressure was at goal. ?Discussed Health Maintenance - consider hep C screening and recheck lipids with next labs.  ? ?Subjective: ?[name] is an 81 y.o. year old female who is a primary patient of Debbrah Alar, NP.  The CCM team was consulted for assistance with disease management and care coordination needs.   ? ?Engaged with patient by telephone for follow up visit in response to provider referral for pharmacy case management and/or care coordination services.  ? ?Consent to Services:  ?The patient was given information about Chronic Care Management services, agreed to services, and gave verbal consent prior to initiation of services.  Please see initial visit note for detailed documentation.  ? ?Patient Care Team: ?Debbrah Alar, NP as PCP - General (Internal Medicine) ?Pixie Casino, MD as PCP - Cardiology (Cardiology) ?Volanda Napoleon, MD as Consulting Physician (Hematology and Oncology) ?Marygrace Drought, MD as Consulting Physician (Ophthalmology) ?Frederik Pear, MD as Consulting Physician (Orthopedic Surgery) ?Pixie Casino, MD as Consulting Physician (Cardiology) ?Troy Sine, MD as Consulting Physician (Cardiology) ?Phylliss Bob, MD as Consulting Physician (Orthopedic Surgery) ?Cherre Robins, RPH-CPP (Pharmacist) ? ?Recent office visits: ?09/08/2021 - Phone Call - Alendronate stopped due to increase Scr. Prolia denied due to bone thinning not severe enough. Due to recheck DEXA in November 2023. ?09/06/2021 - Int Med Inda Castle, NP)F/U chronic conditions. BP at goal. Labs checked. No med changes ?  ?  ?Recent consult  visits: ?03/25/2021 - Cardio (Dr Debara Pickett) Seen for palpitation. No medication changes noted.  ?  ?Hospital visits: ?None in previous 6 months ? ? ?Objective: ? ?Lab Results  ?Component Value Date  ? CREATININE 1.63 (H) 09/06/2021  ? CREATININE 1.44 (H) 02/16/2021  ? CREATININE 1.85 (H) 11/16/2020  ? ? ?Lab Results  ?Component Value Date  ? HGBA1C 5.9 09/06/2021  ? ?Last diabetic Eye exam: No results found for: HMDIABEYEEXA  ?Last diabetic Foot exam: No results found for: HMDIABFOOTEX  ? ?   ?Component Value Date/Time  ? CHOL 167 05/04/2020 1402  ? TRIG 94 05/04/2020 1402  ? HDL 79 05/04/2020 1402  ? CHOLHDL 2.1 05/04/2020 1402  ? VLDL 14.2 12/25/2018 0854  ? [place] 70 05/04/2020 1402  ? LDLDIRECT 110.4 12/13/2006 0900  ? ? ? ?  Latest Ref Rng & Units 09/06/2021  ?  1:31 PM 08/11/2020  ?  1:07 PM 05/04/2020  ?  2:02 PM  ?Hepatic Function  ?Total Protein 6.0 - 8.3 g/dL 7.3   7.0   6.9    ?Albumin 3.5 - 5.2 g/dL 4.4   4.3     ?AST 0 - 37 U/L _0 ?ALT 0 - 35 U/L _1 ?Alk Phosphatase 39 - 117 U/L 52   69     ?Total Bilirubin 0.2 - 1.2 mg/dL 0.6   0.6   0.7    ? ? ?Lab Results  ?Component Value Date/Time  ? TSH 0.99 09/22/2015 11:56 AM  ? TSH 1.71 12/09/2014 03:43 PM  ? ? ? ?  Latest Ref Rng & Units 09/06/2021  ?  1:31 PM 08/11/2020  ?  1:06 PM 05/04/2020  ?  2:02 PM  ?CBC  ?WBC 4.0 - 10.5 K/uL 9.1   8.7   10.5    ?Hemoglobin 12.0 - 15.0 g/dL 10.5   10.3   10.8    ?Hematocrit 36.0 - 46.0 % 31.2   31.3   32.3    ?Platelets 150.0 - 400.0 K/uL 264.0   279.0   303    ? ? ?Lab Results  ?Component Value Date/Time  ? VD25OH 65.63 07/22/2019 11:30 AM  ? VD25OH 89.96 12/26/2018 11:37 AM  ? ? ?Clinical ASCVD: No  ?The 10-year ASCVD risk score (Arnett DK, et al., 2019) is: 32.8% ?  Values used to calculate the score: ?    Age: 17 years ?    Sex: Female ?    Is Non-Hispanic African American: No ?    Diabetic: No ?    Tobacco smoker: No ?    Systolic Blood Pressure: 585 mmHg ?    Is BP treated: Yes ?    HDL  Cholesterol: 79 mg/dL ?    Total Cholesterol: 167 mg/dL   ? ?Other: (CHADS2VASc if Afib, PHQ9 if depression, MMRC or CAT for COPD, ACT, DEXA) ? ?Social History  ? ?Tobacco Use  ?Smoking Status Former  ? Packs/day: 0.50  ? Years: 10.00  ? Pack years: 5.00  ? Types: Cigarettes  ? Quit date: 07/14/1974  ? Years since quitting: 91.2  ?Smokeless Tobacco Never  ?Tobacco Comments  ? quit in 1976 smoked for approx. 10 years  ? ?BP Readings from Last 3 Encounters:  ?09/06/21 136/69  ?03/25/21 (!) 168/92  ?03/01/21 (!) 172/74  ? ?Pulse Readings from Last 3 Encounters:  ?09/06/21 75  ?03/25/21 72  ?03/01/21 74  ? ?Wt Readings from Last 3 Encounters:  ?09/06/21 234 lb (106.1 kg)  ?03/25/21 233 lb 12.8 oz (106.1 kg)  ?03/01/21 233 lb (105.7 kg)  ? ? ?Assessment: Review of patient past medical history, allergies, medications, health status, including review of consultants reports, laboratory and other test data, was performed as part of comprehensive evaluation and provision of chronic care management services.  ? ?SDOH:  (Social Determinants of Health) assessments and interventions performed:  ? ? ?CCM Care Plan ? ?Allergies  ?Allergen Reactions  ? Amlodipine Swelling  ?  Swelling ?  ? Sulfonamide Derivatives Swelling  ?  REACTION: Swelling of the face; "eyes swelled shut"  ? Myrbetriq [Mirabegron]   ?  Facial swelling, sob  ? Chocolate Other (See Comments)  ?  Sinus problems  ? ? ?Medications Reviewed Today   ? ? Reviewed by Cherre Robins, RPH-CPP (Pharmacist) on 10/13/21 at 1127  Med List Status: <None>  ? ?Medication Order Taking? Sig Documenting Provider Last Dose Status Informant  ?acetaminophen (TYLENOL) 500 MG tablet [id number] Yes Take 1,000 mg by mouth in the morning, at noon, and at bedtime. Take with Gabapetin [provider] Taking Active   ?bisoprolol (ZEBETA) 10 MG tablet [id number] Yes TAKE 2 TABLETS (20 MG TOTAL) BY MOUTH DAILY.  ?Patient taking differently: Take 10 mg by mouth in the morning and at bedtime.   ? Pixie Casino, MD Taking Active   ?famotidine (PEPCID) 20 MG tablet [id number] Yes Take 1 tablet (20 mg total) by mouth 2 (two) times daily. Mosie Lukes, MD Taking Active   ?fexofenadine (ALLEGRA) 180 MG tablet [id number] Yes Take 180 mg by mouth daily as needed. For seasonal allergies [provider] Taking Active  Self  ?FLUoxetine (PROZAC) 10 MG capsule [id number] Yes Take 1 capsule (10 mg total) by mouth daily.  ?Patient taking differently: Take 10 mg by mouth at bedtime.  ? Mosie Lukes, MD Taking Active   ?fluticasone (FLONASE) 50 MCG/ACT nasal spray [id number] Yes USE 1 SPRAY IN EACH NOSTRIL EVERY DAY  ?Patient taking differently: Place 1 spray into both nostrils daily as needed.  ? Debbrah Alar, NP Taking Active Self  ?furosemide (LASIX) 40 MG tablet [id number] Yes Take 1 tablet (40 mg total) by mouth 2 (two) times daily. Almyra Deforest, [place] Taking Active   ?gabapentin (NEURONTIN) 100 MG capsule [id number] Yes TAKE 1 CAPSULE BY MOUTH THREE TIMES A DAY Debbrah Alar, NP Taking Active   ?hydrALAZINE (APRESOLINE) 25 MG tablet [id number] Yes Take 1 tablet (25 mg total) by mouth in the morning and at bedtime. In the morning and at bedtime Mosie Lukes, MD Taking Active   ?Lidocaine 4 % PTCH [id number] Yes Apply 1 patch topically daily as needed (pain). [provider] Taking Active Self  ?Omega-3 Fatty Acids (FISH OIL) 600 MG CAPS [id number] Yes Take 2 capsules by mouth daily. [provider] Taking Active   ?Polyethyl Glycol-Propyl Glycol (SYSTANE OP) [id number] Yes Apply 1 drop to eye daily as needed (dry eyes). [provider] Taking Active Self  ?simvastatin (ZOCOR) 10 MG tablet [id number] Yes Take 1 tablet (10 mg total) by mouth daily. Mosie Lukes, MD Taking Active   ?valsartan (DIOVAN) 320 MG tablet [id number] Yes Take 1 tablet (320 mg total) by mouth daily. Mosie Lukes, MD Taking Active   ? ?  ?  ? ?  ? ? ?Patient Active Problem List  ? Diagnosis Date  Noted  ? Seasonal allergies 03/03/2021  ? Skin abrasion 11/16/2020  ? Depression 11/16/2020  ? Stage 3b chronic kidney disease ([place]) 07/24/2019  ? 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 Traumatic closed displaced fract

## 2021-10-13 NOTE — Patient Instructions (Signed)
[name] ?It was a pleasure speaking with you  ?Below is a summary of your health goals and care plan ? ? ? ? ?If you have any questions or concerns, please feel free to contact me either at the phone number below or with a MyChart message.  ? ?Keep up the good work! ? ?Cherre Robins, PharmD ?Clinical Pharmacist ?[facility] ?[place] ?[phone] (direct line)  ?[phone] (main office number) ? ? ?Chronic Care Management Care Plan:  ? ?Hypertension / heart failure / lower leg swelling: ?BP Readings from Last 3 Encounters:  ?09/06/21 136/69  ?03/25/21 (!) 168/92  ?03/01/21 (!) 172/74  ? ?Pharmacist Clinical Goal(s): ?Over the next 180 days, patient will work with PharmD and providers to achieve BP goal <130/80 ?Current regimen:  ?Bisoprolol '10mg'$  - take 1 tablet twice a day OR 2 tablets once daily ?Hydralazine '25mg'$  - take 1 tablet twice daily ?Valsartan '320mg'$  - take 1 tablet each evening ?Furosemide '40mg'$  - take 1 tablet twice a day ?Interventions: ?Discussed blood pressure goal  ?Discussed signs and symptoms of CHF exacerbation ?Patient self care activities - Over the next 180 days, patient will: ?Check blood pressure daily, document, and provide at future appointments ?Ensure daily salt intake < 2300 mg/day ?Continue current medications for blood pressure, heart and swelling ?Discussed signs and symptoms of CHF exacerbation - weight gain, shortness of breath,  abdominal fullness, swelling in legs or abdomen, Fatigue and weakness, changes in ability to perform usual activities, persistent cough or wheezing with white or pink blood-tinged mucus, nausea and lack of appetite. Contact either PCP or cardiologist if you experience any of these symptoms.  ?Continue to weigh daily - report weight gain of more than 3 lbs in 24 hours or 5 lbs in 1 week. ? ?Hyperlipidemia ?Lab Results  ?Component Value Date/Time  ? LDLCALC 70 05/04/2020 02:02 PM  ? LDLDIRECT 110.4 12/13/2006 09:00 AM  ? ?Pharmacist  Clinical Goal(s): ?Over the next 180 days, patient will work with PharmD and providers to maintain LDL goal < 100 ?Current regimen:  ?Fish Oil '1200mg'$  daily each morning ?Simvastatin '10mg'$  daily night  ?Interventions: ?Discussed LDL goal  ?Patient self care activities - Over the next 180 days, patient will: ?Maintain cholesterol medication regimen.  ?Check lipid panel with next labs ? ?Pre-Diabetes ?Lab Results  ?Component Value Date/Time  ? HGBA1C 5.9 09/06/2021 01:31 PM  ? HGBA1C 5.7 (H) 05/04/2020 02:02 PM  ? ?Pharmacist Clinical Goal(s): ?Over the next 180 days, patient will work with PharmD and providers to maintain A1c goal <6.5% ?Current regimen:  ?Diet and exercise management   ?Interventions: ?Discussed a1c goal ?Patient self care activities - Over the next 180 days, patient will: ?Maintain a1c less than 6.5% ?Continue to do exercises from physical therapy every day ? ?Osteopenia / low bone density:  ?Pharmacist Clinical Goal(s): ?Over the next 180 days, patient will work with PharmD and providers to prevent falls and fracutres ?Current regimen:  ?none   ?Interventions: ?Reviewed how to take alendronate ?Discussed fall prevention tips ?Patient self care activities - Over the next 180 days, patient will: ?Continue off alendraonte, plan is to recheck bone density in November 2023.  ?Continue to use cane and walker regularly ?Practice fall prevention techniques ? ?Decreased kidney function:  ?Pharmacist Clinical Goal(s): ?Over the next 180 days, patient will work with PharmD and providers to prevent declined in kidney function ?Current regimen:  ?none   ?Interventions: ?Discussed reason for avoiding NSAIDs like ibuprofen (can affect  kidneys and increase blood pressure) ?Consider rechecking kidney function  ?Patient self care activities - Over the next 180 days, patient will: ?Avoid ibuprofen and naproxen  ?Continue with plan to see kidney specialist.  ? ?Health Maintenance  ?Pharmacist Clinical Goal(s) ?Over the  next 180 days, patient will work with PharmD and providers to complete health maintenance screenings/vaccinations ?Patient self care activities - Over the next 180 days, patient will: ?Get Hepatis C screen with next labs ? ? ?Medication management ?Pharmacist Clinical Goal(s): ?Over the next 180 days, patient will work with PharmD and providers to maintain optimal medication adherence ?Current pharmacy: [facility] ?Interventions ?Comprehensive medication review performed. ?Continue current medication management strategy ?Focus on medication adherence by filling and taking medications appropriately  ?Take medications as prescribed ?Report any questions or concerns to PharmD and/or provider(s) ? ?Patient Goals/Self-Care Activities ?take medications as prescribed,  ?check blood pressure daily , document, and provide at future appointments,  ?weigh daily, and contact provider if weight gain of > 3 lbs in 24 hours and > 5 lbs in 1 week,  ?engage in dietary modifications by limiting intake of sodium to < '2300mg'$  , and continue to do exercises daily from physical therapy.  ? ?Patient verbalizes understanding of instructions and care plan provided today and agrees to view in [place]. Active MyChart status confirmed with patient.    ?

## 2021-11-06 DIAGNOSIS — I110 Hypertensive heart disease with heart failure: Secondary | ICD-10-CM

## 2021-11-06 DIAGNOSIS — E785 Hyperlipidemia, unspecified: Secondary | ICD-10-CM

## 2021-11-06 DIAGNOSIS — F32A Depression, unspecified: Secondary | ICD-10-CM

## 2021-11-09 ENCOUNTER — Other Ambulatory Visit: Payer: Self-pay | Admitting: Family Medicine

## 2021-11-10 ENCOUNTER — Telehealth: Payer: Self-pay | Admitting: Family

## 2021-11-10 NOTE — Telephone Encounter (Signed)
Left message for patient to call back and schedule Medicare Annual Wellness Visit (AWV).  ? ?Please offer to do virtually or by telephone.  Left office number and my jabber [phone]. ? ?Last AWV:05/04/2020 ? ?Please schedule at anytime with Nurse Health Advisor. ?  ?

## 2021-11-22 ENCOUNTER — Ambulatory Visit (INDEPENDENT_AMBULATORY_CARE_PROVIDER_SITE_OTHER): Payer: Medicare Other

## 2021-11-22 DIAGNOSIS — Z0000 Encounter for general adult medical examination without abnormal findings: Secondary | ICD-10-CM

## 2021-11-22 NOTE — Patient Instructions (Signed)
[name] , ?Thank you for taking time to come for your Medicare Wellness Visit. I appreciate your ongoing commitment to your health goals. Please review the following plan we discussed and let me know if I can assist you in the future.  ? ?Screening recommendations/referrals: ?Colonoscopy: no longer needed ?Mammogram: no longer needed due mastectomy  ?Bone Density: 05/07/20 due 05/07/22 ?Recommended yearly ophthalmology/optometry visit for glaucoma screening and checkup ?Recommended yearly dental visit for hygiene and checkup ? ?Vaccinations: ?Influenza vaccine: up to date ?Pneumococcal vaccine: up to date ?Tdap vaccine: up to date ?Shingles vaccine: up to date   ?Covid-19:completed ? ?Advanced directives: yes, not on file ? ?Conditions/risks identified: see problem list  ? ?Next appointment: Follow up in one year for your annual wellness visit 11/28/22 ? ? ?Preventive Care 79 Years and Older, Female ?Preventive care refers to lifestyle choices and visits with your health care provider that can promote health and wellness. ?What does preventive care include? ?A yearly physical exam. This is also called an annual well check. ?Dental exams once or twice a year. ?Routine eye exams. Ask your health care provider how often you should have your eyes checked. ?Personal lifestyle choices, including: ?Daily care of your teeth and gums. ?Regular physical activity. ?Eating a healthy diet. ?Avoiding tobacco and drug use. ?Limiting alcohol use. ?Practicing safe sex. ?Taking low-dose aspirin every day. ?Taking vitamin and mineral supplements as recommended by your health care provider. ?What happens during an annual well check? ?The services and screenings done by your health care provider during your annual well check will depend on your age, overall health, lifestyle risk factors, and family history of disease. ?Counseling  ?Your health care provider may ask you questions about your: ?Alcohol use. ?Tobacco use. ?Drug  use. ?Emotional well-being. ?Home and relationship well-being. ?Sexual activity. ?Eating habits. ?History of falls. ?Memory and ability to understand (cognition). ?Work and work Statistician. ?Reproductive health. ?Screening  ?You may have the following tests or measurements: ?Height, weight, and BMI. ?Blood pressure. ?Lipid and cholesterol levels. These may be checked every 5 years, or more frequently if you are over 25 years old. ?Skin check. ?Lung cancer screening. You may have this screening every year starting at age 45 if you have a 30-pack-year history of smoking and currently smoke or have quit within the past 15 years. ?Fecal occult blood test (FOBT) of the stool. You may have this test every year starting at age 79. ?Flexible sigmoidoscopy or colonoscopy. You may have a sigmoidoscopy every 5 years or a colonoscopy every 10 years starting at age 79. ?Hepatitis C blood test. ?Hepatitis B blood test. ?Sexually transmitted disease (STD) testing. ?Diabetes screening. This is done by checking your blood sugar (glucose) after you have not eaten for a while (fasting). You may have this done every 1-3 years. ?Bone density scan. This is done to screen for osteoporosis. You may have this done starting at age 79. ?Mammogram. This may be done every 1-2 years. Talk to your health care provider about how often you should have regular mammograms. ?Talk with your health care provider about your test results, treatment options, and if necessary, the need for more tests. ?Vaccines  ?Your health care provider may recommend certain vaccines, such as: ?Influenza vaccine. This is recommended every year. ?Tetanus, diphtheria, and acellular pertussis (Tdap, Td) vaccine. You may need a Td booster every 10 years. ?Zoster vaccine. You may need this after age 79. ?Pneumococcal 13-valent conjugate (PCV13) vaccine. One dose is recommended after age 79. ?  Pneumococcal polysaccharide (PPSV23) vaccine. One dose is recommended after age  20. ?Talk to your health care provider about which screenings and vaccines you need and how often you need them. ?This information is not intended to replace advice given to you by your health care provider. Make sure you discuss any questions you have with your health care provider. ?Document Released: 07/23/2015 Document Revised: 03/15/2016 Document Reviewed: 04/27/2015 ?Elsevier Interactive Patient Education ? 2017 [place]. ? ?Fall Prevention in the Home ?Falls can cause injuries. They can happen to people of all ages. There are many things you can do to make your home safe and to help prevent falls. ?What can I do on the outside of my home? ?Regularly fix the edges of walkways and driveways and fix any cracks. ?Remove anything that might make you trip as you walk through a door, such as a raised step or threshold. ?Trim any bushes or trees on the path to your home. ?Use bright outdoor lighting. ?Clear any walking paths of anything that might make someone trip, such as rocks or tools. ?Regularly check to see if handrails are loose or broken. Make sure that both sides of any steps have handrails. ?Any raised decks and porches should have guardrails on the edges. ?Have any leaves, snow, or ice cleared regularly. ?Use sand or salt on walking paths during winter. ?Clean up any spills in your garage right away. This includes oil or grease spills. ?What can I do in the bathroom? ?Use night lights. ?Install grab bars by the toilet and in the tub and shower. Do not use towel bars as grab bars. ?Use non-skid mats or decals in the tub or shower. ?If you need to sit down in the shower, use a plastic, non-slip stool. ?Keep the floor dry. Clean up any water that spills on the floor as soon as it happens. ?Remove soap buildup in the tub or shower regularly. ?Attach bath mats securely with double-sided non-slip rug tape. ?Do not have throw rugs and other things on the floor that can make you trip. ?What can I do in the  bedroom? ?Use night lights. ?Make sure that you have a light by your bed that is easy to reach. ?Do not use any sheets or blankets that are too big for your bed. They should not hang down onto the floor. ?Have a firm chair that has side arms. You can use this for support while you get dressed. ?Do not have throw rugs and other things on the floor that can make you trip. ?What can I do in the kitchen? ?Clean up any spills right away. ?Avoid walking on wet floors. ?Keep items that you use a lot in easy-to-reach places. ?If you need to reach something above you, use a strong step stool that has a grab bar. ?Keep electrical cords out of the way. ?Do not use floor polish or wax that makes floors slippery. If you must use wax, use non-skid floor wax. ?Do not have throw rugs and other things on the floor that can make you trip. ?What can I do with my stairs? ?Do not leave any items on the stairs. ?Make sure that there are handrails on both sides of the stairs and use them. Fix handrails that are broken or loose. Make sure that handrails are as long as the stairways. ?Check any carpeting to make sure that it is firmly attached to the stairs. Fix any carpet that is loose or worn. ?Avoid having throw rugs at the  top or bottom of the stairs. If you do have throw rugs, attach them to the floor with carpet tape. ?Make sure that you have a light switch at the top of the stairs and the bottom of the stairs. If you do not have them, ask someone to add them for you. ?What else can I do to help prevent falls? ?Wear shoes that: ?Do not have high heels. ?Have rubber bottoms. ?Are comfortable and fit you well. ?Are closed at the toe. Do not wear sandals. ?If you use a stepladder: ?Make sure that it is fully opened. Do not climb a closed stepladder. ?Make sure that both sides of the stepladder are locked into place. ?Ask someone to hold it for you, if possible. ?Clearly mark and make sure that you can see: ?Any grab bars or  handrails. ?First and last steps. ?Where the edge of each step is. ?Use tools that help you move around (mobility aids) if they are needed. These include: ?Canes. ?Walkers. ?Scooters. ?Crutches. ?Turn on the lights when you go int

## 2021-11-22 NOTE — Progress Notes (Signed)
? ?Subjective:  ? [name] is a 81 y.o. female who presents for Medicare Annual (Subsequent) preventive examination. ? ?I connected with  Shenise Spieler on 11/22/21 by a audio enabled telemedicine application and verified that I am speaking with the correct person using two identifiers. ? ?Patient Location: Home ? ?Provider Location: Office/Clinic ? ?I discussed the limitations of evaluation and management by telemedicine. The patient expressed understanding and agreed to proceed.  ? ?Review of Systems    ? ?Cardiac Risk Factors include: advanced age (>52mn, >>63women);hypertension;dyslipidemia ? ?   ?Objective:  ?  ?There were no vitals filed for this visit. ?There is no height or weight on file to calculate BMI. ? ? ?  11/22/2021  ? 11:28 AM 05/01/2019  ? 11:11 AM 03/19/2018  ?  1:20 PM 10/18/2017  ?  1:25 PM 07/30/2017  ?  9:52 AM 08/10/2016  ? 10:58 AM 12/23/2015  ?  9:01 PM  ?Advanced Directives  ?Does Patient Have a Medical Advance Directive? Yes Yes Yes Yes Yes Yes No  ?Type of AParamedicof [place]enoLiving will;Out of facility DNR (pink MOST or yellow form) HGrand [place] PlazaLiving will HBayfieldLiving will HHavre de GraceLiving will Living will HPort GibsonLiving will   ?Does patient want to make changes to medical advance directive? No - Patient declined No - Patient declined   No - Patient declined    ?Copy of [place] Chart? Yes - validated most recent copy scanned in chart (See row information) Yes - validated most recent copy scanned in chart (See row information) Yes Yes  Yes   ?Would patient like information on creating a medical advance directive?       No - patient declined information  ? ? ?Current Medications (verified) ?Outpatient Encounter Medications as of 11/22/2021  ?Medication Sig  ? acetaminophen (TYLENOL) 500 MG tablet Take 1,000 mg by mouth in the morning, at noon, and at  bedtime. Take with Gabapetin  ? bisoprolol (ZEBETA) 10 MG tablet TAKE 2 TABLETS (20 MG TOTAL) BY MOUTH DAILY. (Patient taking differently: Take 10 mg by mouth in the morning and at bedtime.)  ? famotidine (PEPCID) 20 MG tablet Take 1 tablet (20 mg total) by mouth 2 (two) times daily.  ? fexofenadine (ALLEGRA) 180 MG tablet Take 180 mg by mouth daily as needed. For seasonal allergies  ? FLUoxetine (PROZAC) 10 MG capsule TAKE 1 CAPSULE BY MOUTH DAILY  ? fluticasone (FLONASE) 50 MCG/ACT nasal spray USE 1 SPRAY IN EACH NOSTRIL EVERY DAY (Patient taking differently: Place 1 spray into both nostrils daily as needed.)  ? furosemide (LASIX) 40 MG tablet Take 1 tablet (40 mg total) by mouth 2 (two) times daily.  ? gabapentin (NEURONTIN) 100 MG capsule TAKE 1 CAPSULE BY MOUTH THREE TIMES A DAY  ? hydrALAZINE (APRESOLINE) 25 MG tablet Take 1 tablet (25 mg total) by mouth in the morning and at bedtime. In the morning and at bedtime  ? ketotifen (ZADITOR) 0.025 % ophthalmic solution Place 1 drop into both eyes 2 (two) times daily as needed.  ? Lidocaine 4 % PTCH Apply 1 patch topically daily as needed (pain).  ? Omega-3 Fatty Acids (FISH OIL) 600 MG CAPS Take 2 capsules by mouth daily.  ? Polyethyl Glycol-Propyl Glycol (SYSTANE OP) Apply 1 drop to eye daily as needed (dry eyes).  ? simvastatin (ZOCOR) 10 MG tablet TAKE 1 TABLET BY MOUTH DAILY  ? valsartan (  DIOVAN) 320 MG tablet TAKE 1 TABLET BY MOUTH DAILY  ? ?No facility-administered encounter medications on file as of 11/22/2021.  ? ? ?Allergies (verified) ?Amlodipine, Sulfonamide derivatives, Myrbetriq [mirabegron], and Chocolate  ? ?History: ?Past Medical History:  ?Diagnosis Date  ? Allergic rhinitis   ? Ankle fracture   ? Left, Mortise Widening  ? Arthritis   ? "knuckles" (06/17/2012)  ? Borderline diabetes mellitus 03/06/2011  ? Breast cancer ([facility])   ? "right" (06/17/2012)  ? Cataracts, bilateral   ? Dyspnea   ? with exertion  ? Exertional dyspnea   ? GERD (gastroesophageal  reflux disease)   ? Hypertension   ? Iron deficiency anemia   ? "hematologist watches it" (06/17/2012)  ? Obesity   ? OSA treated with BiPAP 03/08/2016  ? Osteoarthritis   ? severe right hip osteoarthritis-s/p hip replacement  ? Osteopenia 11/26/2009  ? Thyroid disorder   ? "sees endocrinologist yearly" (06/17/2012)  ? ?Past Surgical History:  ?Procedure Laterality Date  ? ANKLE FRACTURE SURGERY Right 01/01/14  ? Has pins. Procedure performed at [facility]  ? "bilaterlly" (06/17/2012)  ? COLONOSCOPY W/ POLYPECTOMY    ? EYE SURGERY  2011  ? cataract removal both eyes  ? HIP CLOSED REDUCTION  07/26/2012  ? Procedure: CLOSED MANIPULATION HIP;  Surgeon: Nita Sells, MD;  [facility];  Service: Orthopedics;  Laterality: Right;  ? LUMBAR LAMINECTOMY  10/26/2017  ? LUMBAR LAMINECTOMY/DECOMPRESSION MICRODISCECTOMY Bilateral 10/25/2017  ? Procedure: LUMBAR 2-3, LUMBAR 3-4 DECOMPRESSION TIME REQUESTED 4.5 HOURS;  Surgeon: Phylliss Bob, MD;  Location: [facility];  Service: Orthopedics;  Laterality: Bilateral;  ? MASTECTOMY  1993?  ? bilateral mastectomy  ? ORIF ANKLE FRACTURE Left 07/30/2017  ? Procedure: LEFT OPEN REDUCTION INTERNAL FIXATION (ORIF) ANKLE FRACTURE WITH SYDESMOTIC FIXATION;  Surgeon: Dorna Leitz, MD;  Location: [facility];  Service: Orthopedics;  Laterality: Left;  ? TONSILLECTOMY  1949  ? TOTAL HIP ARTHROPLASTY  04/2006  ? right hip   ? TOTAL HIP REVISION  06/17/2012  ? "right" (06/17/2012)  ? TOTAL HIP REVISION  06/17/2012  ? Procedure: TOTAL HIP REVISION;  Surgeon: Kerin Salen, MD;  Location: [facility];  Service: Orthopedics;  Laterality: Right;  ? ?Family History  ?Problem Relation Age of Onset  ? Heart failure Father   ?     deceased age 72  ? Diabetes Father   ? Alcohol abuse Father   ? Breast cancer Mother   ?     deceased at age 66 secondary to breast cancer  ? Liver disease Sister   ?     Liver failure--deceased  ? Dementia Maternal Grandmother   ? Colon cancer Neg Hx   ?  Esophageal cancer Neg Hx   ? Rectal cancer Neg Hx   ? Stomach cancer Neg Hx   ? ?Social History  ? ?Socioeconomic History  ? Marital status: Widowed  ?  Spouse name: Not on file  ? Number of children: Not on file  ? Years of education: Not on file  ? Highest education level: Not on file  ?Occupational History  ? Not on file  ?Tobacco Use  ? Smoking status: Former  ?  Packs/day: 0.50  ?  Years: 10.00  ?  Pack years: 5.00  ?  Types: Cigarettes  ?  Quit date: 07/14/1974  ?  Years since quitting: 47.3  ? Smokeless tobacco: Never  ? Tobacco comments:  ?  quit  in 1976 smoked for approx. 10 years  ?Vaping Use  ? Vaping Use: Never used  ?Substance and Sexual Activity  ? Alcohol use: Yes  ?  Comment: wine on occasion. rare.   ? Drug use: No  ? Sexual activity: Never  ?Other Topics Concern  ? Not on file  ?Social History Narrative  ? Retired  ? The patient is married and has one child and two grandchildren that live in the area.    ? She drinks wine with dinner.  She quit smoking in 1976, smoked for  ? approximately 10 years.      ? Moved to [place] from [place], [place]  ?   ?   ? ?Social Determinants of Health  ? ?Financial Resource Strain: Low Risk   ? Difficulty of Paying Living Expenses: Not hard at all  ?Food Insecurity: No Food Insecurity  ? Worried About Charity fundraiser in the Last Year: Never true  ? Ran Out of Food in the Last Year: Never true  ?Transportation Needs: No Transportation Needs  ? Lack of Transportation (Medical): No  ? Lack of Transportation (Non-Medical): No  ?Physical Activity: Sufficiently Active  ? Days of Exercise per Week: 7 days  ? Minutes of Exercise per Session: 30 min  ?Stress: No Stress Concern Present  ? Feeling of Stress : Not at all  ?Social Connections: Moderately Integrated  ? Frequency of Communication with Friends and Family: More than three times a week  ? Frequency of Social Gatherings with Friends and Family: More than three times a week  ? Attends Religious Services: More than 4  times per year  ? Active Member of Clubs or Organizations: Yes  ? Attends Archivist Meetings: More than 4 times per year  ? Marital Status: Widowed  ? ? ?Tobacco Counseling ?Counseling given: Not

## 2021-11-24 NOTE — Telephone Encounter (Signed)
DEXA due 05/2022. Attempt PA again after DEXA results are back.

## 2021-12-08 ENCOUNTER — Encounter: Payer: Self-pay | Admitting: *Deleted

## 2021-12-27 ENCOUNTER — Other Ambulatory Visit: Payer: Self-pay | Admitting: Family Medicine

## 2021-12-30 ENCOUNTER — Other Ambulatory Visit: Payer: Self-pay

## 2021-12-30 ENCOUNTER — Encounter: Payer: Self-pay | Admitting: Family

## 2021-12-30 MED ORDER — BISOPROLOL FUMARATE 10 MG PO TABS: 20.0000 mg | ORAL_TABLET | Freq: Every day | ORAL | 3 refills | Status: DC

## 2022-01-24 ENCOUNTER — Ambulatory Visit: Payer: Medicare Other | Admitting: Family

## 2022-02-01 LAB — COLOGUARD: Cologuard: NEGATIVE

## 2022-02-09 ENCOUNTER — Encounter: Payer: Self-pay | Admitting: Family

## 2022-02-09 LAB — COLOGUARD: COLOGUARD: NEGATIVE

## 2022-02-15 ENCOUNTER — Ambulatory Visit: Payer: Medicare Other | Admitting: Family

## 2022-02-15 VITALS — BP 149/69 | HR 66 | Temp 98.3°F | Resp 16 | Wt 229.0 lb

## 2022-02-15 DIAGNOSIS — I5032 Chronic diastolic (congestive) heart failure: Secondary | ICD-10-CM

## 2022-02-15 DIAGNOSIS — I10 Essential (primary) hypertension: Secondary | ICD-10-CM

## 2022-02-15 DIAGNOSIS — R7303 Prediabetes: Secondary | ICD-10-CM | POA: Diagnosis not present

## 2022-02-15 DIAGNOSIS — N1832 Chronic kidney disease, stage 3b: Secondary | ICD-10-CM

## 2022-02-15 DIAGNOSIS — K219 Gastro-esophageal reflux disease without esophagitis: Secondary | ICD-10-CM

## 2022-02-15 DIAGNOSIS — F32A Depression, unspecified: Secondary | ICD-10-CM | POA: Diagnosis not present

## 2022-02-15 LAB — BASIC METABOLIC PANEL WITH GFR
BUN: 35 mg/dL — ABNORMAL HIGH (ref 6–23)
CO2: 25 meq/L (ref 19–32)
Calcium: 10.5 mg/dL (ref 8.4–10.5)
Chloride: 102 meq/L (ref 96–112)
Creatinine, Ser: 1.48 mg/dL — ABNORMAL HIGH (ref 0.40–1.20)
GFR: 33.61 mL/min — ABNORMAL LOW
Glucose, Bld: 95 mg/dL (ref 70–99)
Potassium: 4.2 meq/L (ref 3.5–5.1)
Sodium: 137 meq/L (ref 135–145)

## 2022-02-15 LAB — HEMOGLOBIN A1C: Hgb A1c MFr Bld: 5.9 % (ref 4.6–6.5)

## 2022-02-15 MED ORDER — GABAPENTIN 100 MG PO CAPS: ORAL_CAPSULE | ORAL | 1 refills | Status: DC

## 2022-02-15 NOTE — Assessment & Plan Note (Addendum)
Lab Results  Component Value Date   CREATININE 1.63 (H) 09/06/2021   She did not follow through with referral to nephrology last visit but is agreeable to do so at this time.

## 2022-02-15 NOTE — Patient Instructions (Signed)
Please complete lab work prior to leaving.   

## 2022-02-15 NOTE — Assessment & Plan Note (Signed)
Stable with prn pepcid.

## 2022-02-15 NOTE — Assessment & Plan Note (Signed)
Lab Results  Component Value Date   HGBA1C 5.9 09/06/2021   Stable. Monitor on diet.

## 2022-02-15 NOTE — Assessment & Plan Note (Signed)
Wt Readings from Last 3 Encounters:  02/15/22 229 lb (103.9 kg)  09/06/21 234 lb (106.1 kg)  03/25/21 233 lb 12.8 oz (106.1 kg)   Weight/stable and improved. Continue lasix '40mg'$  twice daily.

## 2022-02-15 NOTE — Assessment & Plan Note (Signed)
BP Readings from Last 3 Encounters:  02/15/22 (!) 149/69  09/06/21 136/69  03/25/21 (!) 168/92   Maintained on hydralazine, bisoprolol and losartan.  BP acceptable for her age.

## 2022-02-15 NOTE — Progress Notes (Signed)
Subjective:   By signing my name below, I, [name], attest that this documentation has been prepared under the direction and in the presence of Debbrah Alar, NP 02/15/2022    Patient ID: [name], female    DOB: Oct 16, 1940, 81 y.o.   MRN: [id number]  Chief Complaint  Patient presents with   Hypertension    Here for follow up   Hyperlipidemia    Here for follow up    HPI Patient is in today for a follow up visit.   Nerve pain- She is requesting a refill for 100 mg gabapentin. She tries to avoid taking it during the afternoon but has to take it in the evening due to her pain worsening.   Blood sugar- Her last a1c levels were good.  Lab Results  Component Value Date   HGBA1C 5.9 09/06/2021   Blood pressure- Her blood pressure is stable for her baseline. She continues taking valsartan, hydralazine, and bisoprolol and reports no new issues while taking them.  BP Readings from Last 3 Encounters:  02/15/22 (!) 149/69  09/06/21 136/69  03/25/21 (!) 168/92   Pulse Readings from Last 3 Encounters:  02/15/22 66  09/06/21 75  03/25/21 72   Pepcid- She reports taking her 20 mg Pepcid PRN.   Mood- Her mood is stable while taking Prozac. She reports being drowsy while taking it. She has not taken her dose this morning to avoid drowsiness.   Leg Swelling- Her swelling in her legs has reduced since last visit. She continues taking 40 mg lasix daily PO.   Weight- She has lost weight since last visit. She repots losing 5 lbs since she started actively losing weight.  Wt Readings from Last 3 Encounters:  02/15/22 229 lb (103.9 kg)  09/06/21 234 lb (106.1 kg)  03/25/21 233 lb 12.8 oz (106.1 kg)   Nephrology- She is not following up with a nephrologist at this time. She is interested in establishing care with a nephrologist.    Past Medical History:  Diagnosis Date   Allergic rhinitis    Ankle fracture    Left, Mortise Widening   Arthritis    "knuckles"  (06/17/2012)   Borderline diabetes mellitus 03/06/2011   Breast cancer ([facility])    "right" (06/17/2012)   Cataracts, bilateral    Dyspnea    with exertion   Exertional dyspnea    GERD (gastroesophageal reflux disease)    Hypertension    Iron deficiency anemia    "hematologist watches it" (06/17/2012)   Obesity    OSA treated with BiPAP 03/08/2016   Osteoarthritis    severe right hip osteoarthritis-s/p hip replacement   Osteopenia 11/26/2009   Thyroid disorder    "sees endocrinologist yearly" (06/17/2012)    Past Surgical History:  Procedure Laterality Date   ANKLE FRACTURE SURGERY Right 01/01/14   Has pins. Procedure performed at [facility]   "bilaterlly" (06/17/2012)   COLONOSCOPY W/ POLYPECTOMY     EYE SURGERY  2011   cataract removal both eyes   HIP CLOSED REDUCTION  07/26/2012   Procedure: CLOSED MANIPULATION HIP;  Surgeon: Nita Sells, MD;  [facility];  Service: Orthopedics;  Laterality: Right;   LUMBAR LAMINECTOMY  10/26/2017   LUMBAR LAMINECTOMY/DECOMPRESSION MICRODISCECTOMY Bilateral 10/25/2017   Procedure: LUMBAR 2-3, LUMBAR 3-4 DECOMPRESSION TIME REQUESTED 4.5 HOURS;  Surgeon: Phylliss Bob, MD;  Location: [facility];  Service: Orthopedics;  Laterality: Bilateral;   MASTECTOMY  1993?  bilateral mastectomy   ORIF ANKLE FRACTURE Left 07/30/2017   Procedure: LEFT OPEN REDUCTION INTERNAL FIXATION (ORIF) ANKLE FRACTURE WITH SYDESMOTIC FIXATION;  Surgeon: Dorna Leitz, MD;  Location: [facility];  Service: Orthopedics;  Laterality: Left;   TONSILLECTOMY  1949   TOTAL HIP ARTHROPLASTY  04/2006   right hip    TOTAL HIP REVISION  06/17/2012   "right" (06/17/2012)   TOTAL HIP REVISION  06/17/2012   Procedure: TOTAL HIP REVISION;  Surgeon: Kerin Salen, MD;  Location: [facility];  Service: Orthopedics;  Laterality: Right;    Family History  Problem Relation Age of Onset   Heart failure Father        deceased age 91   Diabetes Father    Alcohol  abuse Father    Breast cancer Mother        deceased at age 92 secondary to breast cancer   Liver disease Sister        Liver failure--deceased   Dementia Maternal Grandmother    Colon cancer Neg Hx    Esophageal cancer Neg Hx    Rectal cancer Neg Hx    Stomach cancer Neg Hx     Social History   Socioeconomic History   Marital status: Widowed    Spouse name: Not on file   Number of children: Not on file   Years of education: Not on file   Highest education level: Not on file  Occupational History   Not on file  Tobacco Use   Smoking status: Former    Packs/day: 0.50    Years: 10.00    Total pack years: 5.00    Types: Cigarettes    Quit date: 07/14/1974    Years since quitting: 47.6   Smokeless tobacco: Never   Tobacco comments:    quit in 1976 smoked for approx. 10 years  Vaping Use   Vaping Use: Never used  Substance and Sexual Activity   Alcohol use: Yes    Comment: wine on occasion. rare.    Drug use: No   Sexual activity: Never  Other Topics Concern   Not on file  Social History Narrative   Retired   The patient is married and has one child and two grandchildren that live in the area.     She drinks wine with dinner.  She quit smoking in 1976, smoked for   approximately 10 years.       Moved to [place] from [place], [place]         Social Determinants of Health   Financial Resource Strain: Low Risk  (01/18/2021)   Overall Financial Resource Strain (CARDIA)    Difficulty of Paying Living Expenses: Not hard at all  Food Insecurity: No Food Insecurity (11/22/2021)   Hunger Vital Sign    Worried About Running Out of Food in the Last Year: Never true    Ran Out of Food in the Last Year: Never true  Transportation Needs: No Transportation Needs (11/22/2021)   PRAPARE - Hydrologist (Medical): No    Lack of Transportation (Non-Medical): No  Physical Activity: Sufficiently Active (01/18/2021)   Exercise Vital Sign    Days of Exercise  per Week: 7 days    Minutes of Exercise per Session: 30 min  Stress: No Stress Concern Present (11/22/2021)   [place]    Feeling of Stress : Not at all  Social Connections: Moderately Integrated (11/22/2021)  Social Licensed conveyancer [NHANES]    Frequency of Communication with Friends and Family: More than three times a week    Frequency of Social Gatherings with Friends and Family: More than three times a week    Attends Religious Services: More than 4 times per year    Active Member of [employer] or Organizations: Yes    Attends Archivist Meetings: More than 4 times per year    Marital Status: Widowed  Intimate Partner Violence: Not At Risk (11/22/2021)   Humiliation, Afraid, Rape, and Kick questionnaire    Fear of Current or Ex-Partner: No    Emotionally Abused: No    Physically Abused: No    Sexually Abused: No    Outpatient Medications Prior to Visit  Medication Sig Dispense Refill   acetaminophen (TYLENOL) 500 MG tablet Take 1,000 mg by mouth in the morning, at noon, and at bedtime. Take with Gabapetin     bisoprolol (ZEBETA) 10 MG tablet Take 2 tablets (20 mg total) by mouth daily. 180 tablet 3   famotidine (PEPCID) 20 MG tablet Take 1 tablet (20 mg total) by mouth 2 (two) times daily. 180 tablet 1   fexofenadine (ALLEGRA) 180 MG tablet Take 180 mg by mouth daily as needed. For seasonal allergies     FLUoxetine (PROZAC) 10 MG capsule TAKE 1 CAPSULE BY MOUTH DAILY 90 capsule 3   fluticasone (FLONASE) 50 MCG/ACT nasal spray USE 1 SPRAY IN EACH NOSTRIL EVERY DAY (Patient taking differently: Place 1 spray into both nostrils daily as needed.) 32 g 1   furosemide (LASIX) 40 MG tablet Take 1 tablet (40 mg total) by mouth 2 (two) times daily. 180 tablet 3   hydrALAZINE (APRESOLINE) 25 MG tablet TAKE 1 TABLET BY MOUTH IN THE  MORNING AND 1 TABLET BY MOUTH AT BEDTIME 180 tablet 3   ketotifen (ZADITOR)  0.025 % ophthalmic solution Place 1 drop into both eyes 2 (two) times daily as needed.     Lidocaine 4 % PTCH Apply 1 patch topically daily as needed (pain).     Omega-3 Fatty Acids (FISH OIL) 600 MG CAPS Take 2 capsules by mouth daily.     Polyethyl Glycol-Propyl Glycol (SYSTANE OP) Apply 1 drop to eye daily as needed (dry eyes).     simvastatin (ZOCOR) 10 MG tablet TAKE 1 TABLET BY MOUTH DAILY 90 tablet 3   valsartan (DIOVAN) 320 MG tablet TAKE 1 TABLET BY MOUTH DAILY 90 tablet 3   gabapentin (NEURONTIN) 100 MG capsule TAKE 1 CAPSULE BY MOUTH THREE TIMES A DAY 270 capsule 1   No facility-administered medications prior to visit.    Allergies  Allergen Reactions   Amlodipine Swelling    Swelling    Sulfonamide Derivatives Swelling    REACTION: Swelling of the face; "eyes swelled shut"   Myrbetriq [Mirabegron]     Facial swelling, sob   Chocolate Other (See Comments)    Sinus problems    ROS     Objective:    Physical Exam Constitutional:      General: She is not in acute distress.    Appearance: Normal appearance. She is not ill-appearing.  HENT:     Head: Normocephalic and atraumatic.     Right Ear: External ear normal.     Left Ear: External ear normal.  Eyes:     Extraocular Movements: Extraocular movements intact.     Pupils: Pupils are equal, round, and reactive to light.  Cardiovascular:  Rate and Rhythm: Normal rate and regular rhythm.     Heart sounds: Normal heart sounds. No murmur heard.    No gallop.  Pulmonary:     Effort: Pulmonary effort is normal. No respiratory distress.     Breath sounds: Normal breath sounds. No wheezing or rales.  Musculoskeletal:     Right lower leg: 4+ Edema present.     Left lower leg: 4+ Edema present.     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  Neurological:     Mental Status: She is alert and oriented to person, place, and time.   Psychiatric:        Judgment: Judgment normal.     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       Relevant Orders   Ambulatory referral to Nephrology   GERD (gastroesophageal reflux disease)    Stable with prn pepcid.       Essential hypertension    BP Readings from Last 3 Encounters:  02/15/22 (!) 149/69  09/06/21 136/69  03/25/21 (!) 168/92  Maintained on hydralazine, bisoprolol and losartan.  BP acceptable for her age.       Relevant Orders   Basic metabolic panel   Depression    Reports that mood is stable on prozac '10mg'$ .  Continue same.       Chronic diastolic heart failure (HCC)    Wt Readings from Last 3 Encounters:  02/15/22 229 lb (103.9 kg)  09/06/21 234 lb (106.1 kg)  03/25/21 233 lb 12.8 oz (106.1 kg)  Weight/stable and improved. Continue lasix '40mg'$  twice daily.       Borderline diabetes mellitus    Lab Results  Component Value Date   HGBA1C 5.9 09/06/2021  Stable. Monitor on diet.       Relevant Orders   Hemoglobin A1c     Meds ordered this encounter  Medications   gabapentin (NEURONTIN) 100 MG capsule    Sig: TAKE 1 CAPSULE BY MOUTH THREE TIMES A DAY    Dispense:  270 capsule    Refill:  1    Order Specific Question:   Supervising Provider    Answer:   Penni Homans A [[id number]]    I, Nance Pear, NP, personally preformed  the services described in this documentation.  All medical record entries made by the scribe were at my direction and in my presence.  I have reviewed the chart and discharge instructions (if applicable) and agree that the record reflects my personal performance and is accurate and complete. 02/15/2022   I,[name],acting as a scribe for Nance Pear, NP.,have  documented all relevant documentation on the behalf of Nance Pear, NP,as directed by  Nance Pear, NP while in the presence of Nance Pear, NP.   Nance Pear, NP

## 2022-02-15 NOTE — Assessment & Plan Note (Addendum)
Reports that mood is stable on prozac '10mg'$ .  Continue same.

## 2022-02-16 ENCOUNTER — Telehealth: Payer: Self-pay | Admitting: Family

## 2022-02-16 NOTE — Telephone Encounter (Signed)
[name] called stating that they had tried to get in touch with the pt twice to schedule her for an appt off the referral that was sent over. Pt was attempted to be contacted while rep was on the phone. Pt did not answer either number. [name] would like [place] to reach out to pt to advise them to give [facility] a call.

## 2022-03-03 ENCOUNTER — Encounter: Payer: Self-pay | Admitting: Family

## 2022-03-03 MED ORDER — FUROSEMIDE 40 MG PO TABS
40.0000 mg | ORAL_TABLET | Freq: Two times a day (BID) | ORAL | 1 refills | Status: DC
Start: 2022-03-03 — End: 2022-07-15

## 2022-03-10 NOTE — Telephone Encounter (Signed)
This information was faxed to [place] for provider to review and sign. Will be faxed back when signed

## 2022-03-21 ENCOUNTER — Other Ambulatory Visit: Payer: Self-pay | Admitting: Family Medicine

## 2022-04-06 ENCOUNTER — Other Ambulatory Visit: Payer: Self-pay | Admitting: Family

## 2022-04-13 ENCOUNTER — Ambulatory Visit (INDEPENDENT_AMBULATORY_CARE_PROVIDER_SITE_OTHER): Payer: Medicare Other | Admitting: Pharmacist

## 2022-04-13 DIAGNOSIS — I5032 Chronic diastolic (congestive) heart failure: Secondary | ICD-10-CM

## 2022-04-13 DIAGNOSIS — N1832 Chronic kidney disease, stage 3b: Secondary | ICD-10-CM

## 2022-04-13 NOTE — Progress Notes (Signed)
Pharmacy Note  04/13/2022 Name: [name] MRN: [id number] DOB: 03/01/1941  Subjective: [name] is a 81 y.o. year old female who is a primary care patient of Debbrah Alar, NP. Clinical Pharmacist Practitioner referral was placed to assist with medication management.    Engaged with patient by telephone for follow up visit today.  CKD: Last SCr has improved. No dose adjustments needed for current medications based on renal function. Patient did state that she had taken an ibuprofen after to got her COVID and flu vaccine last week Reminded patient to use acetaminophen instead and to avoid use of NSAIDs  CHF: Patient denies edema recently. She reports a 4lbs weight decreased. She is taking furosemide 29m twice a day, bisoprolol 140mtwice a day and valsartan 32049maily  Hypertension: Home blood pressure today was 125/75. Usually blood pressure is 120 to 130's / 70 Patient reports she received COVID vaccine and annual flu vaccine [facility]/10/2022 [facility] [place]  Objective: Review of patient status, including review of consultants reports, laboratory and other test data, was performed as part of comprehensive evaluation and provision of chronic care management services.   Lab Results  Component Value Date   CREATININE 1.48 (H) 02/15/2022   CREATININE 1.63 (H) 09/06/2021   CREATININE 1.44 (H) 02/16/2021    Lab Results  Component Value Date   HGBA1C 5.9 02/15/2022       Component Value Date/Time   CHOL 167 05/04/2020 1402   TRIG 94 05/04/2020 1402   HDL 79 05/04/2020 1402   CHOLHDL 2.1 05/04/2020 1402   VLDL 14.2 12/25/2018 0854   LDLCALC 70 05/04/2020 1402   LDLDIRECT 110.4 12/13/2006 0900     Clinical ASCVD: No  The 10-year ASCVD risk score (Arnett DK, et al., 2019) is: 42.1%   Values used to calculate the score:     Age: [age]     Sex: Female     Is Non-Hispanic African American: No     Diabetic: No     Tobacco smoker: No     Systolic  Blood Pressure: 149 mmHg     Is BP treated: Yes     HDL Cholesterol: 79 mg/dL     Total Cholesterol: 167 mg/dL    BP Readings from Last 3 Encounters:  02/15/22 (!) 149/69  09/06/21 136/69  03/25/21 (!) 168/92     Allergies  Allergen Reactions   Amlodipine Swelling    Swelling    Sulfonamide Derivatives Swelling    REACTION: Swelling of the face; "eyes swelled shut"   Myrbetriq [Mirabegron]     Facial swelling, sob   Chocolate Other (See Comments)    Sinus problems    Medications Reviewed Today     Reviewed by EckCherre RobinsPH-CPP (Pharmacist) on 04/13/22 at 1124  Med List Status: <None>   Medication Order Taking? Sig Documenting Provider Last Dose Status Informant  acetaminophen (TYLENOL) 500 MG tablet [id number] Take 1,000 mg by mouth in the morning, at noon, and at bedtime. Take with Gabapetin [provider] Taking Active   bisoprolol (ZEBETA) 10 MG tablet [id number] Take 2 tablets (20 mg total) by mouth daily. O'SDebbrah AlarP Taking Active   famotidine (PEPCID) 20 MG tablet [id number] TAKE 1 TABLET BY MOUTH TWICE  DAILY BlyMosie LukesD Taking Active            Med Note ([facility] [place]  Thu Apr 13, 2022 11:13 AM) As needed  fexofenadine (ALLEGRA) 180 MG tablet [id number] Yes Take 180 mg by mouth daily as needed. For seasonal allergies [provider] Taking Active Self  FLUoxetine (PROZAC) 10 MG capsule [id number] Yes Take 1 capsule (10 mg total) by mouth daily.  Patient taking differently: Take 10 mg by mouth at bedtime.   Debbrah Alar, NP Taking Active   fluticasone (FLONASE) 50 MCG/ACT nasal spray [id number] Yes USE 1 SPRAY IN EACH NOSTRIL EVERY DAY  Patient taking differently: Place 1 spray into both nostrils daily as needed.   Debbrah Alar, NP Taking Active Self  furosemide (LASIX) 40 MG tablet [id number] Yes Take 1 tablet (40 mg total) by mouth 2 (two) times daily. Debbrah Alar, NP Taking Active   gabapentin  (NEURONTIN) 100 MG capsule [id number] Yes TAKE 1 CAPSULE BY MOUTH THREE TIMES A DAY Debbrah Alar, NP Taking Active   hydrALAZINE (APRESOLINE) 25 MG tablet [id number] Yes TAKE 1 TABLET BY MOUTH IN THE  MORNING AND 1 TABLET BY MOUTH AT BEDTIME Debbrah Alar, NP Taking Active   ketotifen (ZADITOR) 0.025 % ophthalmic solution [id number] Yes Place 1 drop into both eyes 2 (two) times daily as needed. [provider] Taking Active   Lidocaine 4 % PTCH [id number] Yes Apply 1 patch topically daily as needed (pain). [provider] Taking Active Self  Omega-3 Fatty Acids (FISH OIL) 600 MG CAPS [id number] Yes Take 2 capsules by mouth daily. [provider] Taking Active   Polyethyl Glycol-Propyl Glycol (SYSTANE OP) [id number] Yes Apply 1 drop to eye daily as needed (dry eyes). 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   Traumatic closed displaced fracture of lateral malleolus of left fibula, initial encounter 07/30/2017   Low back pain 07/30/2017   Displaced fracture of distal end of left fibula 07/30/2017   Chronic diastolic heart failure ([place]) 03/15/2017   OSA (obstructive sleep apnea) 03/08/2016   Palpitations 10/29/2015   Snoring 10/29/2015   Excessive daytime sleepiness 10/29/2015   PVC's (premature ventricular contractions) 10/29/2015   SVT (supraventricular tachycardia) 10/29/2015   Onychomycosis 09/23/2015   Leukocytosis 12/16/2014   Osteoarthritis of both hands 08/29/2014   GERD (gastroesophageal reflux  disease) 03/25/2014   Morbid obesity due to excess calories ([place]) 03/05/2013   Dislocation of hip prosthesis ([place]) 07/26/2012   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  Patient affirms current coverage meets needs.   Assessment/ Plan:  CHF - well controlled / continue current therapy - bisoprolol, valsartan and furosemide Hypertension - controlled - continue current therapy - hydralazine, bisoprolol, valsartan and furosemide.    Follow Up:  No  further follow up required: Patient has met all medication related goals.    Cherre Robins, PharmD Clinical Pharmacist [place] [phone]

## 2022-05-10 ENCOUNTER — Telehealth: Payer: Self-pay | Admitting: Family

## 2022-05-10 DIAGNOSIS — M8580 Other specified disorders of bone density and structure, unspecified site: Secondary | ICD-10-CM

## 2022-05-10 NOTE — Telephone Encounter (Signed)
F/u Dexa order placed.

## 2022-05-16 ENCOUNTER — Telehealth (HOSPITAL_BASED_OUTPATIENT_CLINIC_OR_DEPARTMENT_OTHER): Payer: Self-pay

## 2022-05-29 ENCOUNTER — Other Ambulatory Visit (HOSPITAL_COMMUNITY): Payer: Self-pay

## 2022-05-29 NOTE — Telephone Encounter (Signed)
Forwarding to Rx prior auth team.

## 2022-05-29 NOTE — Telephone Encounter (Addendum)
Pharmacy Patient Advocate Encounter  Insurance verification completed.    The patient is insured through [employer]    Ran test claims for: Prolia '60mg'$ .  Pharmacy benefit copay: $270.00

## 2022-05-29 NOTE — Telephone Encounter (Signed)
Prolia VOB initiated via [email or web address] 

## 2022-05-31 ENCOUNTER — Telehealth (HOSPITAL_BASED_OUTPATIENT_CLINIC_OR_DEPARTMENT_OTHER): Payer: Self-pay

## 2022-05-31 ENCOUNTER — Encounter: Payer: Self-pay | Admitting: Family

## 2022-06-09 NOTE — Telephone Encounter (Signed)
Pt wanted to make [place] aware she was able to update her flu shot in [place] but also needed to update [place] that she got the Covid vaccine the same day as the flu shot.

## 2022-07-06 ENCOUNTER — Other Ambulatory Visit: Payer: Self-pay | Admitting: Family

## 2022-07-14 ENCOUNTER — Other Ambulatory Visit: Payer: Self-pay | Admitting: Family

## 2022-08-02 ENCOUNTER — Ambulatory Visit (HOSPITAL_BASED_OUTPATIENT_CLINIC_OR_DEPARTMENT_OTHER)
Admission: RE | Admit: 2022-08-02 | Discharge: 2022-08-02 | Disposition: A | Discharge status: Home / Self Care | Payer: Medicare Other | Source: Ambulatory Visit | Attending: Family | Admitting: Family

## 2022-08-02 ENCOUNTER — Ambulatory Visit: Payer: Medicare Other | Admitting: Family

## 2022-08-02 VITALS — BP 137/60 | HR 73 | Temp 97.5°F | Resp 16

## 2022-08-02 DIAGNOSIS — M25572 Pain in left ankle and joints of left foot: Secondary | ICD-10-CM | POA: Insufficient documentation

## 2022-08-02 DIAGNOSIS — L03119 Cellulitis of unspecified part of limb: Secondary | ICD-10-CM | POA: Insufficient documentation

## 2022-08-02 DIAGNOSIS — L0390 Cellulitis, unspecified: Secondary | ICD-10-CM

## 2022-08-02 MED ORDER — CEPHALEXIN 500 MG PO CAPS: 500.0000 mg | ORAL_CAPSULE | Freq: Three times a day (TID) | ORAL | 0 refills | Status: DC

## 2022-08-02 NOTE — Progress Notes (Signed)
Subjective:   By signing my name below, I, [name], attest that this documentation has been prepared under the direction and in the presence of Courtney Alar, NP. 08/02/2022   Patient ID: [name], female    DOB: Oct 04, 1940, 82 y.o.   MRN: [id number]  Chief Complaint  Patient presents with   Leg Pain    Complains of left leg pain since Thursday     Leg Pain    Patient is in today for a office visit.  She is accompanied by her son [name].  Ankle pain: She complains of left inner ankle pain. She fell recently and hurt her left ankle. She also reports having scaly and red skin in her left lower ankle. She continues taking 40 mg lasix daily PO.  Wt Readings from Last 3 Encounters:  02/15/22 229 lb (103.9 kg)  09/06/21 234 lb (106.1 kg)  03/25/21 233 lb 12.8 oz (106.1 kg)    Past Medical History:  Diagnosis Date   Allergic rhinitis    Ankle fracture    Left, [place]   Arthritis    "knuckles" (06/17/2012)   Borderline diabetes mellitus 03/06/2011   Breast cancer ([place])    "right" (06/17/2012)   Cataracts, bilateral    Dyspnea    with exertion   Exertional dyspnea    GERD (gastroesophageal reflux disease)    Hypertension    Iron deficiency anemia    "hematologist watches it" (06/17/2012)   Obesity    OSA treated with BiPAP 03/08/2016   Osteoarthritis    severe right hip osteoarthritis-s/p hip replacement   Osteopenia 11/26/2009   Thyroid disorder    "sees endocrinologist yearly" (06/17/2012)    Past Surgical History:  Procedure Laterality Date   ANKLE FRACTURE SURGERY Right 01/01/14   Has pins. Procedure performed at [facility]   "bilaterlly" (06/17/2012)   COLONOSCOPY W/ POLYPECTOMY     EYE SURGERY  2011   cataract removal both eyes   HIP CLOSED REDUCTION  07/26/2012   Procedure: CLOSED MANIPULATION HIP;  Surgeon: Nita Sells, MD;  Location: [facility];  Service: Orthopedics;  Laterality: Right;   LUMBAR  LAMINECTOMY  10/26/2017   LUMBAR LAMINECTOMY/DECOMPRESSION MICRODISCECTOMY Bilateral 10/25/2017   Procedure: LUMBAR 2-3, LUMBAR 3-4 DECOMPRESSION TIME REQUESTED 4.5 HOURS;  Surgeon: Phylliss Bob, MD;  Location: [facility];  Service: Orthopedics;  Laterality: Bilateral;   MASTECTOMY  1993?   bilateral mastectomy   ORIF ANKLE FRACTURE Left 07/30/2017   Procedure: LEFT OPEN REDUCTION INTERNAL FIXATION (ORIF) ANKLE FRACTURE WITH SYDESMOTIC FIXATION;  Surgeon: Dorna Leitz, MD;  Location: [facility];  Service: Orthopedics;  Laterality: Left;   TONSILLECTOMY  1949   TOTAL HIP ARTHROPLASTY  04/2006   right hip    TOTAL HIP REVISION  06/17/2012   "right" (06/17/2012)   TOTAL HIP REVISION  06/17/2012   Procedure: TOTAL HIP REVISION;  Surgeon: Kerin Salen, MD;  Location: [facility];  Service: Orthopedics;  Laterality: Right;    Family History  Problem Relation Age of Onset   Heart failure Father        deceased age 16   Diabetes Father    Alcohol abuse Father    Breast cancer Mother        deceased at age 14 secondary to breast cancer   Liver disease Sister        Liver failure--deceased   Dementia Maternal Grandmother    Colon cancer Neg Hx  Esophageal cancer Neg Hx    Rectal cancer Neg Hx    Stomach cancer Neg Hx     Social History   Socioeconomic History   Marital status: Widowed    Spouse name: Not on file   Number of children: Not on file   Years of education: Not on file   Highest education level: Not on file  Occupational History   Not on file  Tobacco Use   Smoking status: Former    Packs/day: 0.50    Years: 10.00    Total pack years: 5.00    Types: Cigarettes    Quit date: 07/14/1974    Years since quitting: 48.0   Smokeless tobacco: Never   Tobacco comments:    quit in 1976 smoked for approx. 10 years  Vaping Use   Vaping Use: Never used  Substance and Sexual Activity   Alcohol use: Yes    Comment: wine on occasion. rare.    Drug use: No   Sexual activity: Never  Other  Topics Concern   Not on file  Social History Narrative   Retired   The patient is married and has one child and two grandchildren that live in the area.     She drinks wine with dinner.  She quit smoking in 1976, smoked for   approximately 10 years.       Moved to [place] from [place], [place]         Social Determinants of Health   Financial Resource Strain: Low Risk  (01/18/2021)   Overall Financial Resource Strain (CARDIA)    Difficulty of Paying Living Expenses: Not hard at all  Food Insecurity: No Food Insecurity (11/22/2021)   Hunger Vital Sign    Worried About Running Out of Food in the Last Year: Never true    Ran Out of Food in the Last Year: Never true  Transportation Needs: No Transportation Needs (11/22/2021)   PRAPARE - Hydrologist (Medical): No    Lack of Transportation (Non-Medical): No  Physical Activity: Sufficiently Active (01/18/2021)   Exercise Vital Sign    Days of Exercise per Week: 7 days    Minutes of Exercise per Session: 30 min  Stress: No Stress Concern Present (11/22/2021)   [place]    Feeling of Stress : Not at all  Social Connections: Moderately Integrated (11/22/2021)   Social Connection and Isolation Panel [NHANES]    Frequency of Communication with Friends and Family: More than three times a week    Frequency of Social Gatherings with Friends and Family: More than three times a week    Attends Religious Services: More than 4 times per year    Active Member of [employer] or Organizations: Yes    Attends Archivist Meetings: More than 4 times per year    Marital Status: Widowed  Intimate Partner Violence: Not At Risk (11/22/2021)   Humiliation, Afraid, Rape, and Kick questionnaire    Fear of Current or Ex-Partner: No    Emotionally Abused: No    Physically Abused: No    Sexually Abused: No    Outpatient Medications Prior to Visit  Medication Sig  Dispense Refill   acetaminophen (TYLENOL) 500 MG tablet Take 1,000 mg by mouth in the morning, at noon, and at bedtime. Take with Gabapetin     bisoprolol (ZEBETA) 10 MG tablet Take 2 tablets (20 mg total) by mouth daily. 180 tablet 3  famotidine (PEPCID) 20 MG tablet TAKE 1 TABLET BY MOUTH TWICE  DAILY 180 tablet 3   fexofenadine (ALLEGRA) 180 MG tablet Take 180 mg by mouth daily as needed. For seasonal allergies     FLUoxetine (PROZAC) 10 MG capsule Take 1 capsule (10 mg total) by mouth daily. (Patient taking differently: Take 10 mg by mouth at bedtime.) 90 capsule 1   fluticasone (FLONASE) 50 MCG/ACT nasal spray USE 1 SPRAY IN EACH NOSTRIL EVERY DAY (Patient taking differently: Place 1 spray into both nostrils daily as needed.) 32 g 1   furosemide (LASIX) 40 MG tablet TAKE 1 TABLET BY MOUTH TWICE  DAILY 180 tablet 1   gabapentin (NEURONTIN) 100 MG capsule TAKE 1 CAPSULE BY MOUTH 3 TIMES  DAILY 270 capsule 3   hydrALAZINE (APRESOLINE) 25 MG tablet TAKE 1 TABLET BY MOUTH IN THE  MORNING AND 1 TABLET BY MOUTH AT BEDTIME 180 tablet 3   ketotifen (ZADITOR) 0.025 % ophthalmic solution Place 1 drop into both eyes 2 (two) times daily as needed.     Lidocaine 4 % PTCH Apply 1 patch topically daily as needed (pain).     Omega-3 Fatty Acids (FISH OIL) 600 MG CAPS Take 2 capsules by mouth daily.     Polyethyl Glycol-Propyl Glycol (SYSTANE OP) Apply 1 drop to eye daily as needed (dry eyes).     simvastatin (ZOCOR) 10 MG tablet TAKE 1 TABLET BY MOUTH DAILY 90 tablet 3   valsartan (DIOVAN) 320 MG tablet TAKE 1 TABLET BY MOUTH DAILY 90 tablet 3   No facility-administered medications prior to visit.    Allergies  Allergen Reactions   Amlodipine Swelling    Swelling    Sulfonamide Derivatives Swelling    REACTION: Swelling of the face; "eyes swelled shut"   Myrbetriq [Mirabegron]     Facial swelling, sob   Chocolate Other (See Comments)    Sinus problems    Review of Systems  Musculoskeletal:         (+)left ankle pain  Skin:        (+)scaly skin on left ankle       Objective:    Physical Exam Constitutional:      General: She is not in acute distress.    Appearance: Normal appearance. She is not ill-appearing.  HENT:     Head: Normocephalic and atraumatic.     Right Ear: External ear normal.     Left Ear: External ear normal.  Eyes:     Extraocular Movements: Extraocular movements intact.     Pupils: Pupils are equal, round, and reactive to light.  Cardiovascular:     Rate and Rhythm: Normal rate and regular rhythm.     Heart sounds: Normal heart sounds. No murmur heard.    No gallop.     Comments: RLE edema about 3+, LLE edema 4+ especially around foot and ankle Pulmonary:     Effort: Pulmonary effort is normal. No respiratory distress.     Breath sounds: Normal breath sounds. No wheezing or rales.  Skin:    General: Skin is warm and dry.     Findings: Erythema (left lower shin) present.     Comments: Dry scaling skin bilateral shins. Small approximately 1 cm scabbed sore noted right lower media shin  Neurological:     Mental Status: She is alert and oriented to person, place, and time.  Psychiatric:        Judgment: Judgment normal.     BP 137/60 (  BP Location: Left Arm, Patient Position: Sitting, Cuff Size: Large)   Pulse 73   Temp (!) 97.5 F (36.4 C) (Oral)   Resp 16   LMP 07/11/1991   SpO2 99%  Wt Readings from Last 3 Encounters:  02/15/22 229 lb (103.9 kg)  09/06/21 234 lb (106.1 kg)  03/25/21 233 lb 12.8 oz (106.1 kg)       Assessment & Plan:  Cellulitis, unspecified cellulitis site Assessment & Plan: New. Will rx with keflex.   Orders: -     Cephalexin; Take 1 capsule (500 mg total) by mouth 3 (three) times daily.  Dispense: 21 capsule; Refill: 0  Left ankle pain, unspecified chronicity -     DG Ankle Complete Left; Future   Addendum- x-ray negative for new fracture.   I, Nance Pear, NP, personally preformed the services  described in this documentation.  All medical record entries made by the scribe were at my direction and in my presence.  I have reviewed the chart and discharge instructions (if applicable) and agree that the record reflects my personal performance and is accurate and complete. 08/02/2022   I,[name],acting as a Education administrator for Nance Pear, NP.,have documented all relevant documentation on the behalf of Nance Pear, NP,as directed by  Nance Pear, NP while in the presence of Nance Pear, NP.   Nance Pear, NP

## 2022-08-02 NOTE — Assessment & Plan Note (Signed)
New. Will rx with keflex.  °

## 2022-08-09 ENCOUNTER — Ambulatory Visit: Payer: Medicare Other | Admitting: Family

## 2022-08-09 VITALS — BP 137/56 | HR 72 | Temp 97.9°F | Resp 16

## 2022-08-09 DIAGNOSIS — L03116 Cellulitis of left lower limb: Secondary | ICD-10-CM | POA: Diagnosis not present

## 2022-08-09 MED ORDER — DOXYCYCLINE HYCLATE 100 MG PO TABS: 100.0000 mg | ORAL_TABLET | Freq: Two times a day (BID) | ORAL | 0 refills | Status: DC

## 2022-08-09 NOTE — Progress Notes (Addendum)
Subjective:   By signing my name below, I, [name], attest that this documentation has been prepared under the direction and in the presence of Debbrah Alar, NP. 08/09/2022   Patient ID: [name], female    DOB: [birth year], 82 y.o.   MRN: [id number]  Chief Complaint  Patient presents with   Cellulitis    Follow up cellulitis left leg    HPI Patient is in today for a follow up visit of her cellulitis and left ankle pain. She had a negative ankle x-ray but still has some ongoing left ankle pain.   Her left leg swelling has improved since last week. She has noted some clear weeping drainage on the back of her left ankle and redness around her left lower leg. She is able to stand but cannot walk. She continues taking 40 mg 2x daily PO. She did not get weight prior to this visit.    Past Medical History:  Diagnosis Date   Allergic rhinitis    Ankle fracture    Left, Mortise Widening   Arthritis    "knuckles" (06/17/2012)   Borderline diabetes mellitus 03/06/2011   Breast cancer ([place])    "right" (06/17/2012)   Cataracts, bilateral    Dyspnea    with exertion   Exertional dyspnea    GERD (gastroesophageal reflux disease)    Hypertension    Iron deficiency anemia    "hematologist watches it" (06/17/2012)   Obesity    OSA treated with BiPAP 03/08/2016   Osteoarthritis    severe right hip osteoarthritis-s/p hip replacement   Osteopenia 11/26/2009   Thyroid disorder    "sees endocrinologist yearly" (06/17/2012)    Past Surgical History:  Procedure Laterality Date   ANKLE FRACTURE SURGERY Right 01/01/14   Has pins. Procedure performed at [facility]   "bilaterlly" (06/17/2012)   COLONOSCOPY W/ POLYPECTOMY     EYE SURGERY  2011   cataract removal both eyes   HIP CLOSED REDUCTION  07/26/2012   Procedure: CLOSED MANIPULATION HIP;  Surgeon: Nita Sells, MD;  Location: [facility];  Service: Orthopedics;  Laterality: Right;    LUMBAR LAMINECTOMY  10/26/2017   LUMBAR LAMINECTOMY/DECOMPRESSION MICRODISCECTOMY Bilateral 10/25/2017   Procedure: LUMBAR 2-3, LUMBAR 3-4 DECOMPRESSION TIME REQUESTED 4.5 HOURS;  Surgeon: Phylliss Bob, MD;  Location: [facility];  Service: Orthopedics;  Laterality: Bilateral;   MASTECTOMY  1993?   bilateral mastectomy   ORIF ANKLE FRACTURE Left 07/30/2017   Procedure: LEFT OPEN REDUCTION INTERNAL FIXATION (ORIF) ANKLE FRACTURE WITH SYDESMOTIC FIXATION;  Surgeon: Dorna Leitz, MD;  Location: [facility];  Service: Orthopedics;  Laterality: Left;   TONSILLECTOMY  1949   TOTAL HIP ARTHROPLASTY  04/2006   right hip    TOTAL HIP REVISION  06/17/2012   "right" (06/17/2012)   TOTAL HIP REVISION  06/17/2012   Procedure: TOTAL HIP REVISION;  Surgeon: Kerin Salen, MD;  Location: [facility];  Service: Orthopedics;  Laterality: Right;    Family History  Problem Relation Age of Onset   Heart failure Father        deceased age 18   Diabetes Father    Alcohol abuse Father    Breast cancer Mother        deceased at age 52 secondary to breast cancer   Liver disease Sister        Liver failure--deceased   Dementia Maternal Grandmother    Colon cancer Neg Hx    Esophageal  cancer Neg Hx    Rectal cancer Neg Hx    Stomach cancer Neg Hx     Social History   Socioeconomic History   Marital status: Widowed    Spouse name: Not on file   Number of children: Not on file   Years of education: Not on file   Highest education level: Not on file  Occupational History   Not on file  Tobacco Use   Smoking status: Former    Packs/day: 0.50    Years: 10.00    Total pack years: 5.00    Types: Cigarettes    Quit date: 07/14/1974    Years since quitting: 48.1   Smokeless tobacco: Never   Tobacco comments:    quit in 1976 smoked for approx. 10 years  Vaping Use   Vaping Use: Never used  Substance and Sexual Activity   Alcohol use: Yes    Comment: wine on occasion. rare.    Drug use: No   Sexual activity: Never   Other Topics Concern   Not on file  Social History Narrative   Retired   The patient is married and has one child and two grandchildren that live in the area.     She drinks wine with dinner.  She quit smoking in 1976, smoked for   approximately 10 years.       Moved to [place] from [place], [place]         Social Determinants of Health   Financial Resource Strain: Low Risk  (01/18/2021)   Overall Financial Resource Strain (CARDIA)    Difficulty of Paying Living Expenses: Not hard at all  Food Insecurity: No Food Insecurity (11/22/2021)   Hunger Vital Sign    Worried About Running Out of Food in the Last Year: Never true    Ran Out of Food in the Last Year: Never true  Transportation Needs: No Transportation Needs (11/22/2021)   PRAPARE - Hydrologist (Medical): No    Lack of Transportation (Non-Medical): No  Physical Activity: Sufficiently Active (01/18/2021)   Exercise Vital Sign    Days of Exercise per Week: 7 days    Minutes of Exercise per Session: 30 min  Stress: No Stress Concern Present (11/22/2021)   [place]    Feeling of Stress : Not at all  Social Connections: Moderately Integrated (11/22/2021)   Social Connection and Isolation Panel [NHANES]    Frequency of Communication with Friends and Family: More than three times a week    Frequency of Social Gatherings with Friends and Family: More than three times a week    Attends Religious Services: More than 4 times per year    Active Member of [employer] or Organizations: Yes    Attends Archivist Meetings: More than 4 times per year    Marital Status: Widowed  Intimate Partner Violence: Not At Risk (11/22/2021)   Humiliation, Afraid, Rape, and Kick questionnaire    Fear of Current or Ex-Partner: No    Emotionally Abused: No    Physically Abused: No    Sexually Abused: No    Outpatient Medications Prior to Visit   Medication Sig Dispense Refill   acetaminophen (TYLENOL) 500 MG tablet Take 1,000 mg by mouth in the morning, at noon, and at bedtime. Take with Gabapetin     bisoprolol (ZEBETA) 10 MG tablet Take 2 tablets (20 mg total) by mouth daily. 180 tablet 3  cephALEXin (KEFLEX) 500 MG capsule Take 1 capsule (500 mg total) by mouth 3 (three) times daily. 21 capsule 0   famotidine (PEPCID) 20 MG tablet TAKE 1 TABLET BY MOUTH TWICE  DAILY 180 tablet 3   fexofenadine (ALLEGRA) 180 MG tablet Take 180 mg by mouth daily as needed. For seasonal allergies     FLUoxetine (PROZAC) 10 MG capsule Take 1 capsule (10 mg total) by mouth daily. (Patient taking differently: Take 10 mg by mouth at bedtime.) 90 capsule 1   fluticasone (FLONASE) 50 MCG/ACT nasal spray USE 1 SPRAY IN EACH NOSTRIL EVERY DAY (Patient taking differently: Place 1 spray into both nostrils daily as needed.) 32 g 1   furosemide (LASIX) 40 MG tablet TAKE 1 TABLET BY MOUTH TWICE  DAILY 180 tablet 1   gabapentin (NEURONTIN) 100 MG capsule TAKE 1 CAPSULE BY MOUTH 3 TIMES  DAILY 270 capsule 3   hydrALAZINE (APRESOLINE) 25 MG tablet TAKE 1 TABLET BY MOUTH IN THE  MORNING AND 1 TABLET BY MOUTH AT BEDTIME 180 tablet 3   ketotifen (ZADITOR) 0.025 % ophthalmic solution Place 1 drop into both eyes 2 (two) times daily as needed.     Lidocaine 4 % PTCH Apply 1 patch topically daily as needed (pain).     Omega-3 Fatty Acids (FISH OIL) 600 MG CAPS Take 2 capsules by mouth daily.     Polyethyl Glycol-Propyl Glycol (SYSTANE OP) Apply 1 drop to eye daily as needed (dry eyes).     simvastatin (ZOCOR) 10 MG tablet TAKE 1 TABLET BY MOUTH DAILY 90 tablet 3   valsartan (DIOVAN) 320 MG tablet TAKE 1 TABLET BY MOUTH DAILY 90 tablet 3   No facility-administered medications prior to visit.    Allergies  Allergen Reactions   Amlodipine Swelling    Swelling    Sulfonamide Derivatives Swelling    REACTION: Swelling of the face; "eyes swelled shut"   Myrbetriq  [Mirabegron]     Facial swelling, sob   Chocolate Other (See Comments)    Sinus problems    Review of Systems  Skin:        (+)erythema on left lower leg (+)dry skin on left lower leg (+)drainage behind left ankle       Objective:    Physical Exam Constitutional:      General: She is not in acute distress.    Appearance: Normal appearance. She is not ill-appearing.  HENT:     Head: Normocephalic and atraumatic.     Right Ear: External ear normal.     Left Ear: External ear normal.  Eyes:     Extraocular Movements: Extraocular movements intact.     Pupils: Pupils are equal, round, and reactive to light.  Cardiovascular:     Rate and Rhythm: Normal rate and regular rhythm.     Heart sounds: Normal heart sounds. No murmur heard.    No gallop.  Pulmonary:     Effort: Pulmonary effort is normal. No respiratory distress.     Breath sounds: Normal breath sounds. No wheezing or rales.  Skin:    General: Skin is warm and dry.     Findings: Erythema (Erythema left lower extremity) present.     Comments: Dry skin left lower extremity  Neurological:     Mental Status: She is alert and oriented to person, place, and time.  Psychiatric:        Judgment: Judgment normal.      BP (!) 137/56 (BP Location: Left Arm,  Patient Position: Sitting, Cuff Size: Large)   Pulse 72   Temp 97.9 F (36.6 C) (Oral)   Resp 16   LMP 07/11/1991   SpO2 99%  Wt Readings from Last 3 Encounters:  02/15/22 229 lb (103.9 kg)  09/06/21 234 lb (106.1 kg)  03/25/21 233 lb 12.8 oz (106.1 kg)       Assessment & Plan:  Cellulitis of left lower extremity Assessment & Plan: Erythema is perhaps slightly worse as is swelling. She reports good compliance with furosemide with good voiding.  Unable to weight today as she is in wheelchair.  Will d/c keflex and instead begin doxycycline bid. Follow back up in 1 week for re-evaluation.    Other orders -     Doxycycline Hyclate; Take 1 tablet (100 mg total)  by mouth 2 (two) times daily.  Dispense: 14 tablet; Refill: 0    I, Nance Pear, NP, personally preformed the services described in this documentation.  All medical record entries made by the scribe were at my direction and in my presence.  I have reviewed the chart and discharge instructions (if applicable) and agree that the record reflects my personal performance and is accurate and complete. 08/09/2022   I,[name],acting as a Education administrator for Nance Pear, NP.,have documented all relevant documentation on the behalf of Nance Pear, NP,as directed by  Nance Pear, NP while in the presence of Nance Pear, NP.   Nance Pear, NP

## 2022-08-09 NOTE — Assessment & Plan Note (Signed)
Erythema is perhaps slightly worse as is swelling. She reports good compliance with furosemide with good voiding.  Unable to weight today as she is in wheelchair.  Will d/c keflex and instead begin doxycycline bid. Follow back up in 1 week for re-evaluation.

## 2022-08-15 ENCOUNTER — Ambulatory Visit: Payer: Medicare Other | Admitting: Family

## 2022-08-15 VITALS — BP 142/61 | HR 72 | Temp 97.8°F | Resp 16 | Wt 225.0 lb

## 2022-08-15 DIAGNOSIS — F32A Depression, unspecified: Secondary | ICD-10-CM | POA: Diagnosis not present

## 2022-08-15 DIAGNOSIS — L03116 Cellulitis of left lower limb: Secondary | ICD-10-CM

## 2022-08-15 DIAGNOSIS — R609 Edema, unspecified: Secondary | ICD-10-CM

## 2022-08-15 DIAGNOSIS — N289 Disorder of kidney and ureter, unspecified: Secondary | ICD-10-CM

## 2022-08-15 DIAGNOSIS — S82832A Other fracture of upper and lower end of left fibula, initial encounter for closed fracture: Secondary | ICD-10-CM

## 2022-08-15 DIAGNOSIS — I5032 Chronic diastolic (congestive) heart failure: Secondary | ICD-10-CM

## 2022-08-15 DIAGNOSIS — Z8781 Personal history of (healed) traumatic fracture: Secondary | ICD-10-CM

## 2022-08-15 LAB — COMPREHENSIVE METABOLIC PANEL
ALT: 9 U/L (ref 0–35)
AST: 15 U/L (ref 0–37)
Albumin: 3.6 g/dL (ref 3.5–5.2)
Alkaline Phosphatase: 76 U/L (ref 39–117)
BUN: 13 mg/dL (ref 6–23)
CO2: 26 mEq/L (ref 19–32)
Calcium: 9.8 mg/dL (ref 8.4–10.5)
Chloride: 102 mEq/L (ref 96–112)
Creatinine, Ser: 0.98 mg/dL (ref 0.40–1.20)
GFR: 54.93 mL/min — ABNORMAL LOW (ref >60.00)
Glucose, Bld: 149 mg/dL — ABNORMAL HIGH (ref 70–99)
Potassium: 3.1 mEq/L — ABNORMAL LOW (ref 3.5–5.1)
Sodium: 140 mEq/L (ref 135–145)
Total Bilirubin: 0.7 mg/dL (ref 0.2–1.2)
Total Protein: 7.3 g/dL (ref 6.0–8.3)

## 2022-08-15 LAB — BRAIN NATRIURETIC PEPTIDE: Pro B Natriuretic peptide (BNP): 760 pg/mL — ABNORMAL HIGH (ref 0.0–100.0)

## 2022-08-15 MED ORDER — DOXYCYCLINE HYCLATE 100 MG PO TABS: 100.0000 mg | ORAL_TABLET | Freq: Two times a day (BID) | ORAL | 0 refills | Status: AC

## 2022-08-15 NOTE — Progress Notes (Addendum)
Subjective:   By signing my name below, I, Madelin Rear, attest that this documentation has been prepared under the direction and in the presence of Debbrah Alar, NP. 08/15/2022.   Patient ID: [name], female    DOB: 1940-12-27, 82 y.o.   MRN: [id number]  Chief Complaint  Patient presents with   Cellulitis    Here for follow up on cellulitis of the left lower extremity    HPI Patient is in today for an office visit. She is accompanied by a family member.  BLE Edema: Her swelling is worse on the left. She has LLE erythema and crusty skin of the left ankle. On examination this appears to be infected. She states she has tried using peroxide for treatment. She is able to stand and take very small steps. She is compliant with her 40 mg furosemide BID. Her weight in clinic today was 225 lbs. Of note, she endorses past bilateral ankle fractures.  Mood: The other day she took a Prozac due to feeling frustrated and depressed. She had previously stopped this.  Denies having any fever, new muscle pain, joint pain, new moles, congestion, sinus pain, sore throat, chest pain, palpations, cough, SOB ,wheezing, n/v/d, constipation, blood in stool, dysuria, frequency, hematuria, at this time   Past Medical History:  Diagnosis Date   Allergic rhinitis    Ankle fracture    Left, Mortise Widening   Arthritis    "knuckles" (06/17/2012)   Borderline diabetes mellitus 03/06/2011   Breast cancer ([place])    "right" (06/17/2012)   Cataracts, bilateral    Dyspnea    with exertion   Exertional dyspnea    GERD (gastroesophageal reflux disease)    Hypertension    Iron deficiency anemia    "hematologist watches it" (06/17/2012)   Obesity    OSA treated with BiPAP 03/08/2016   Osteoarthritis    severe right hip osteoarthritis-s/p hip replacement   Osteopenia 11/26/2009   Thyroid disorder    "sees endocrinologist yearly" (06/17/2012)    Past Surgical History:  Procedure Laterality Date    ANKLE FRACTURE SURGERY Right 01/01/14   Has pins. Procedure performed at [facility]   "bilaterlly" (06/17/2012)   COLONOSCOPY W/ POLYPECTOMY     EYE SURGERY  2011   cataract removal both eyes   HIP CLOSED REDUCTION  07/26/2012   Procedure: CLOSED MANIPULATION HIP;  Surgeon: Nita Sells, MD;  [facility];  Service: Orthopedics;  Laterality: Right;   LUMBAR LAMINECTOMY  10/26/2017   LUMBAR LAMINECTOMY/DECOMPRESSION MICRODISCECTOMY Bilateral 10/25/2017   Procedure: LUMBAR 2-3, LUMBAR 3-4 DECOMPRESSION TIME REQUESTED 4.5 HOURS;  Surgeon: Phylliss Bob, MD;  Location: [facility];  Service: Orthopedics;  Laterality: Bilateral;   MASTECTOMY  1993?   bilateral mastectomy   ORIF ANKLE FRACTURE Left 07/30/2017   Procedure: LEFT OPEN REDUCTION INTERNAL FIXATION (ORIF) ANKLE FRACTURE WITH SYDESMOTIC FIXATION;  Surgeon: Dorna Leitz, MD;  Location: [facility];  Service: Orthopedics;  Laterality: Left;   TONSILLECTOMY  1949   TOTAL HIP ARTHROPLASTY  04/2006   right hip    TOTAL HIP REVISION  06/17/2012   "right" (06/17/2012)   TOTAL HIP REVISION  06/17/2012   Procedure: TOTAL HIP REVISION;  Surgeon: Kerin Salen, MD;  Location: [facility];  Service: Orthopedics;  Laterality: Right;    Family History  Problem Relation Age of Onset   Heart failure Father        deceased age 52  Diabetes Father    Alcohol abuse Father    Breast cancer Mother        deceased at age 52 secondary to breast cancer   Liver disease Sister        Liver failure--deceased   Dementia Maternal Grandmother    Colon cancer Neg Hx    Esophageal cancer Neg Hx    Rectal cancer Neg Hx    Stomach cancer Neg Hx     Social History   Socioeconomic History   Marital status: Widowed    Spouse name: Not on file   Number of children: Not on file   Years of education: Not on file   Highest education level: Not on file  Occupational History   Not on file  Tobacco Use   Smoking status: Former     Packs/day: 0.50    Years: 10.00    Total pack years: 5.00    Types: Cigarettes    Quit date: 07/14/1974    Years since quitting: 48.1   Smokeless tobacco: Never   Tobacco comments:    quit in 1976 smoked for approx. 10 years  Vaping Use   Vaping Use: Never used  Substance and Sexual Activity   Alcohol use: Yes    Comment: wine on occasion. rare.    Drug use: No   Sexual activity: Never  Other Topics Concern   Not on file  Social History Narrative   Retired   The patient is married and has one child and two grandchildren that live in the area.     She drinks wine with dinner.  She quit smoking in 1976, smoked for   approximately 10 years.       Moved to [place] from [place], [place]         Social Determinants of Health   Financial Resource Strain: Low Risk  (01/18/2021)   Overall Financial Resource Strain (CARDIA)    Difficulty of Paying Living Expenses: Not hard at all  Food Insecurity: No Food Insecurity (11/22/2021)   Hunger Vital Sign    Worried About Running Out of Food in the Last Year: Never true    Ran Out of Food in the Last Year: Never true  Transportation Needs: No Transportation Needs (11/22/2021)   PRAPARE - Hydrologist (Medical): No    Lack of Transportation (Non-Medical): No  Physical Activity: Sufficiently Active (01/18/2021)   Exercise Vital Sign    Days of Exercise per Week: 7 days    Minutes of Exercise per Session: 30 min  Stress: No Stress Concern Present (11/22/2021)   [place]    Feeling of Stress : Not at all  Social Connections: Moderately Integrated (11/22/2021)   Social Connection and Isolation Panel [NHANES]    Frequency of Communication with Friends and Family: More than three times a week    Frequency of Social Gatherings with Friends and Family: More than three times a week    Attends Religious Services: More than 4 times per year    Active Member  of [employer] or Organizations: Yes    Attends Archivist Meetings: More than 4 times per year    Marital Status: Widowed  Intimate Partner Violence: Not At Risk (11/22/2021)   Humiliation, Afraid, Rape, and Kick questionnaire    Fear of Current or Ex-Partner: No    Emotionally Abused: No    Physically Abused: No    Sexually Abused:  No    Outpatient Medications Prior to Visit  Medication Sig Dispense Refill   acetaminophen (TYLENOL) 500 MG tablet Take 1,000 mg by mouth in the morning, at noon, and at bedtime. Take with Gabapetin     bisoprolol (ZEBETA) 10 MG tablet Take 2 tablets (20 mg total) by mouth daily. 180 tablet 3   cephALEXin (KEFLEX) 500 MG capsule Take 1 capsule (500 mg total) by mouth 3 (three) times daily. 21 capsule 0   famotidine (PEPCID) 20 MG tablet TAKE 1 TABLET BY MOUTH TWICE  DAILY 180 tablet 3   fexofenadine (ALLEGRA) 180 MG tablet Take 180 mg by mouth daily as needed. For seasonal allergies     FLUoxetine (PROZAC) 10 MG capsule Take 1 capsule (10 mg total) by mouth daily. (Patient taking differently: Take 10 mg by mouth at bedtime.) 90 capsule 1   fluticasone (FLONASE) 50 MCG/ACT nasal spray USE 1 SPRAY IN EACH NOSTRIL EVERY DAY (Patient taking differently: Place 1 spray into both nostrils daily as needed.) 32 g 1   furosemide (LASIX) 40 MG tablet TAKE 1 TABLET BY MOUTH TWICE  DAILY 180 tablet 1   gabapentin (NEURONTIN) 100 MG capsule TAKE 1 CAPSULE BY MOUTH 3 TIMES  DAILY 270 capsule 3   hydrALAZINE (APRESOLINE) 25 MG tablet TAKE 1 TABLET BY MOUTH IN THE  MORNING AND 1 TABLET BY MOUTH AT BEDTIME 180 tablet 3   ketotifen (ZADITOR) 0.025 % ophthalmic solution Place 1 drop into both eyes 2 (two) times daily as needed.     Lidocaine 4 % PTCH Apply 1 patch topically daily as needed (pain).     Omega-3 Fatty Acids (FISH OIL) 600 MG CAPS Take 2 capsules by mouth daily.     Polyethyl Glycol-Propyl Glycol (SYSTANE OP) Apply 1 drop to eye daily as needed (dry eyes).      simvastatin (ZOCOR) 10 MG tablet TAKE 1 TABLET BY MOUTH DAILY 90 tablet 3   valsartan (DIOVAN) 320 MG tablet TAKE 1 TABLET BY MOUTH DAILY 90 tablet 3   doxycycline (VIBRA-TABS) 100 MG tablet Take 1 tablet (100 mg total) by mouth 2 (two) times daily. 14 tablet 0   No facility-administered medications prior to visit.    Allergies  Allergen Reactions   Amlodipine Swelling    Swelling    Sulfonamide Derivatives Swelling    REACTION: Swelling of the face; "eyes swelled shut"   Myrbetriq [Mirabegron]     Facial swelling, sob   Chocolate Other (See Comments)    Sinus problems    Review of Systems  Constitutional:  Negative for fever.  HENT:  Negative for congestion, sinus pain and sore throat.   Respiratory:  Negative for cough, shortness of breath and wheezing.   Cardiovascular:  Positive for leg swelling (L>R). Negative for chest pain and palpitations.  Gastrointestinal:  Negative for blood in stool, constipation, diarrhea, nausea and vomiting.  Genitourinary:  Negative for dysuria, frequency and hematuria.  Musculoskeletal:  Negative for joint pain and myalgias.       Objective:    Physical Exam Constitutional:      Appearance: Normal appearance.  HENT:     Head: Normocephalic and atraumatic.     Right Ear: Tympanic membrane, ear canal and external ear normal.     Left Ear: Tympanic membrane, ear canal and external ear normal.  Eyes:     Extraocular Movements: Extraocular movements intact.     Pupils: Pupils are equal, round, and reactive to light.  Cardiovascular:  Rate and Rhythm: Normal rate and regular rhythm.     Heart sounds: Murmur heard.     Systolic murmur is present with a grade of 2/6.     No gallop.  Pulmonary:     Effort: Pulmonary effort is normal. No respiratory distress.     Breath sounds: Normal breath sounds. No wheezing or rales.  Musculoskeletal:     Right lower leg: 3+ Edema present.     Left lower leg: 4+ Edema present.     Comments: 4+ LLE  edema, with some yellow skin crusting overlying left ankle. Erythema is improved from last week. 3+ RLE.  Skin:    General: Skin is warm and dry.  Neurological:     General: No focal deficit present.     Mental Status: She is alert and oriented to person, place, and time.  Psychiatric:        Mood and Affect: Mood normal.        Behavior: Behavior normal.     BP (!) 142/61   Pulse 72   Temp 97.8 F (36.6 C) (Oral)   Resp 16   Wt 225 lb (102.1 kg)   LMP 07/11/1991   SpO2 99%   BMI 36.32 kg/m  Wt Readings from Last 3 Encounters:  08/15/22 225 lb (102.1 kg)  02/15/22 229 lb (103.9 kg)  09/06/21 234 lb (106.1 kg)       Assessment & Plan:   Problem List Items Addressed This Visit   None Visit Diagnoses     Edema, unspecified type    -  Primary   Relevant Orders   US Venous Img Lower Unilateral Left   B Nat Peptide   Renal insufficiency       Relevant Orders   Comp Met (CMET)        Meds ordered this encounter  Medications   doxycycline (VIBRA-TABS) 100 MG tablet    Sig: Take 1 tablet (100 mg total) by mouth 2 (two) times daily for 3 days.    Dispense:  6 tablet    Refill:  0    Order Specific Question:   Supervising Provider    Answer:   Penni Homans A [[id number]]    I, Nance Pear, NP, personally preformed the services described in this documentation.  All medical record entries made by the scribe were at my direction and in my presence.  I have reviewed the chart and discharge instructions (if applicable) and agree that the record reflects my personal performance and is accurate and complete. 08/15/2022.  I,Mathew Stumpf,acting as a Education administrator for [employer], NP.,have documented all relevant documentation on the behalf of Nance Pear, NP,as directed by  Nance Pear, NP while in the presence of Nance Pear, NP.   Nance Pear, NP

## 2022-08-15 NOTE — Assessment & Plan Note (Signed)
Wt Readings from Last 3 Encounters:  02/15/22 229 lb (103.9 kg)  09/06/21 234 lb (106.1 kg)  03/25/21 233 lb 12.8 oz (106.1 kg)   Overall tissue erythema is improving, but she is having more superficial yellow crusting with some clear fluid weeping.  Ultimately, we really need to improve her LE edema to prevent the cracking of her skin in order for her skin to heal.  I would like to extend her doxycycline an additional 3 days.  Recommended that she gently was the affected area with cetaphil daily and pat dry.

## 2022-08-15 NOTE — Patient Instructions (Signed)
Please schedule bone density on the first floor.

## 2022-08-16 ENCOUNTER — Telehealth: Payer: Self-pay | Admitting: Family

## 2022-08-16 ENCOUNTER — Ambulatory Visit: Payer: Medicare Other | Admitting: Family

## 2022-08-16 MED ORDER — POTASSIUM CHLORIDE CRYS ER 20 MEQ PO TBCR: 20.0000 meq | EXTENDED_RELEASE_TABLET | Freq: Every day | ORAL | 1 refills | Status: DC

## 2022-08-16 NOTE — Assessment & Plan Note (Signed)
Previous hx of fracture is a likely reason why she has more swelling of the left foot than the right foot.

## 2022-08-16 NOTE — Telephone Encounter (Signed)
-----   Message from Pixie Casino, MD sent at 08/15/2022 10:13 PM EST ----- Regarding: Needs appt We'll reach out to her - thanks for the info.  -[place]  ----- Message ----- From: Debbrah Alar, NP Sent: 08/15/2022   3:22 PM EST To: Pixie Casino, MD  Dr. Debara Pickett,  Could you or one of your associates please try to work her in?  She is having significant LE edema despite lasix '40mg'$  BID.  I ordered a BNP on her which is pending, but she may need additional diuresis.  Thanks,  [employer]

## 2022-08-16 NOTE — Assessment & Plan Note (Signed)
BNP is elevated. Her weight is stable, however I still think she needs additional diuresis. Will continue lasix '40mg'$  bid and refer her back to her cardiologist for further assistance with fluid management.

## 2022-08-16 NOTE — Assessment & Plan Note (Signed)
Uncontrolled. I recommended that she continue the prozac.

## 2022-08-16 NOTE — Telephone Encounter (Signed)
Patient advidsed

## 2022-08-16 NOTE — Telephone Encounter (Signed)
Please advise pt that that potassium is low. Please add kdur 6mq once daily. Rx sent to [facility].  [facility]. [facility] is going to reach out to her to schedule follow up for her swelling. Please follow up with me as scheduled.

## 2022-08-17 ENCOUNTER — Ambulatory Visit (HOSPITAL_BASED_OUTPATIENT_CLINIC_OR_DEPARTMENT_OTHER)
Admission: RE | Admit: 2022-08-17 | Discharge: 2022-08-17 | Disposition: A | Discharge status: Home / Self Care | Payer: Medicare Other | Source: Ambulatory Visit | Attending: Family | Admitting: Family

## 2022-08-17 DIAGNOSIS — R609 Edema, unspecified: Secondary | ICD-10-CM | POA: Diagnosis present

## 2022-08-18 ENCOUNTER — Ambulatory Visit: Payer: Medicare Other | Admitting: Family

## 2022-08-24 ENCOUNTER — Other Ambulatory Visit: Payer: Self-pay

## 2022-08-24 ENCOUNTER — Ambulatory Visit: Payer: Medicare Other | Attending: Nurse Practitioner | Admitting: Nurse Practitioner

## 2022-08-24 ENCOUNTER — Other Ambulatory Visit: Payer: Self-pay | Admitting: Nurse Practitioner

## 2022-08-24 ENCOUNTER — Encounter: Payer: Self-pay | Admitting: Nurse Practitioner

## 2022-08-24 VITALS — BP 142/70 | HR 69 | Ht 67.0 in | Wt 219.0 lb

## 2022-08-24 DIAGNOSIS — I10 Essential (primary) hypertension: Secondary | ICD-10-CM

## 2022-08-24 DIAGNOSIS — I5032 Chronic diastolic (congestive) heart failure: Secondary | ICD-10-CM

## 2022-08-24 DIAGNOSIS — R002 Palpitations: Secondary | ICD-10-CM

## 2022-08-24 DIAGNOSIS — G4733 Obstructive sleep apnea (adult) (pediatric): Secondary | ICD-10-CM | POA: Diagnosis not present

## 2022-08-24 MED ORDER — TORSEMIDE 20 MG PO TABS: 20.0000 mg | ORAL_TABLET | Freq: Every day | ORAL | 3 refills | Status: DC

## 2022-08-24 MED ORDER — TORSEMIDE 40 MG PO TABS: ORAL_TABLET | ORAL | 0 refills | Status: DC

## 2022-08-24 NOTE — Progress Notes (Signed)
Office Visit    Patient Name: [name] Date of Encounter: 08/24/2022  Primary Care Provider:  Debbrah Alar, NP Primary Cardiologist:  Pixie Casino, MD  Chief Complaint    82 year old female with a history of chronic diastolic heart failure, palpitations, hypertension, OSA, obesity, and GERD who presents for follow-up related to heart failure and hypertension.   Past Medical History    Past Medical History:  Diagnosis Date   Allergic rhinitis    Ankle fracture    Left, [place]   Arthritis    "knuckles" (06/17/2012)   Borderline diabetes mellitus 03/06/2011   Breast cancer ([place])    "right" (06/17/2012)   Cataracts, bilateral    Dyspnea    with exertion   Exertional dyspnea    GERD (gastroesophageal reflux disease)    Hypertension    Iron deficiency anemia    "hematologist watches it" (06/17/2012)   Obesity    OSA treated with BiPAP 03/08/2016   Osteoarthritis    severe right hip osteoarthritis-s/p hip replacement   Osteopenia 11/26/2009   Thyroid disorder    "sees endocrinologist yearly" (06/17/2012)   Past Surgical History:  Procedure Laterality Date   ANKLE FRACTURE SURGERY Right 01/01/14   Has pins. Procedure performed at [place]   "bilaterlly" (06/17/2012)   COLONOSCOPY W/ POLYPECTOMY     EYE SURGERY  2011   cataract removal both eyes   HIP CLOSED REDUCTION  07/26/2012   Procedure: CLOSED MANIPULATION HIP;  Surgeon: Nita Sells, MD;  Location: [facility];  Service: Orthopedics;  Laterality: Right;   LUMBAR LAMINECTOMY  10/26/2017   LUMBAR LAMINECTOMY/DECOMPRESSION MICRODISCECTOMY Bilateral 10/25/2017   Procedure: LUMBAR 2-3, LUMBAR 3-4 DECOMPRESSION TIME REQUESTED 4.5 HOURS;  Surgeon: Phylliss Bob, MD;  Location: [place];  Service: Orthopedics;  Laterality: Bilateral;   MASTECTOMY  1993?   bilateral mastectomy   ORIF ANKLE FRACTURE Left 07/30/2017   Procedure: LEFT OPEN REDUCTION INTERNAL FIXATION  (ORIF) ANKLE FRACTURE WITH SYDESMOTIC FIXATION;  Surgeon: Dorna Leitz, MD;  Location: [place];  Service: Orthopedics;  Laterality: Left;   TONSILLECTOMY  1949   TOTAL HIP ARTHROPLASTY  04/2006   right hip    TOTAL HIP REVISION  06/17/2012   "right" (06/17/2012)   TOTAL HIP REVISION  06/17/2012   Procedure: TOTAL HIP REVISION;  Surgeon: Kerin Salen, MD;  Location: [place];  Service: Orthopedics;  Laterality: Right;    Allergies  Allergies  Allergen Reactions   Amlodipine Swelling    Swelling    Sulfonamide Derivatives Swelling    REACTION: Swelling of the face; "eyes swelled shut"   Myrbetriq [Mirabegron]     Facial swelling, sob   Chocolate Other (See Comments)    Sinus problems     Labs/Other Studies Reviewed    The following studies were reviewed today: Myoview 12/01/2015 The left ventricular ejection fraction is normal (55-65%). Nuclear stress EF: 64%. No wall motion abnormalities There was no ST segment deviation noted during stress. Bowel loop artifact noted during stress. This is a low risk study. No ischemia identified.   Echo 09/06/2020  1. Left ventricular ejection fraction, by estimation, is 60 to 65%. The  left ventricle has normal function. The left ventricle has no regional  wall motion abnormalities. Left ventricular diastolic parameters are  consistent with Grade II diastolic  dysfunction (pseudonormalization). Elevated left atrial pressure.   2. Right ventricular systolic function is normal. The right ventricular  size is  normal. There is moderately elevated pulmonary artery systolic  pressure. The estimated right ventricular systolic pressure is 123456 mmHg.   3. Left atrial size was mildly dilated.   4. The mitral valve is normal in structure. Trivial mitral valve  regurgitation. No evidence of mitral stenosis.   5. Tricuspid valve regurgitation is moderate.   6. The aortic valve is tricuspid. Aortic valve regurgitation is not  visualized. No aortic stenosis  is present.   7. The inferior vena cava is normal in size with greater than 50%  respiratory variability, suggesting right atrial pressure of 3 mmHg.    Recent Labs: 09/06/2021: Hemoglobin 10.5; Platelets 264.0 08/15/2022: ALT 9; BUN 13; Creatinine, Ser 0.98; Potassium 3.1; Pro B Natriuretic peptide (BNP) 760.0; Sodium 140  Recent Lipid Panel    Component Value Date/Time   CHOL 167 05/04/2020 1402   TRIG 94 05/04/2020 1402   HDL 79 05/04/2020 1402   CHOLHDL 2.1 05/04/2020 1402   VLDL 14.2 12/25/2018 0854   LDLCALC 70 05/04/2020 1402   LDLDIRECT 110.4 12/13/2006 0900    History of Present Illness    82 year old female with the above past medical history including chronic diastolic heart failure, palpitations, hypertension, OSA, obesity, and GERD.  She was hospitalized in January 2016 in the setting of acute heart failure.  Echocardiogram showed normal systolic and diastolic function.  She improved with diuresis. Cardiac monitor in the setting of palpitations showed PACs, PVCs, and a transient episode of SVT.  Myoview in 2017 showed EF 64%, no ischemia, overall low risk.  She was last seen in the office on 03/25/2021 and was stable from a cardiac standpoint.  Her BP was mildly elevated as is common for her during office visits.   She presents today for follow-up companied by her son.  Since her last visit been stable overall from a cardiac standpoint.  She does note that over the past month she has had increased lower extremity edema, left greater than right.  She stopped taking her Lasix for several months and restarted just 2 weeks ago.  She has also been dealing with left lower extremity cellulitis and just completed a course of doxycycline.  She continues to have significant LLE edema.  She denies chest pain, dyspnea, PND, orthopnea, weight gain.  Other than her lower extremity edema, she reports feeling well.  Home Medications    Current Outpatient Medications  Medication Sig Dispense  Refill   acetaminophen (TYLENOL) 500 MG tablet Take 1,000 mg by mouth in the morning, at noon, and at bedtime. Take with Gabapetin     bisoprolol (ZEBETA) 10 MG tablet Take 2 tablets (20 mg total) by mouth daily. 180 tablet 3   famotidine (PEPCID) 20 MG tablet TAKE 1 TABLET BY MOUTH TWICE  DAILY 180 tablet 3   fexofenadine (ALLEGRA) 180 MG tablet Take 180 mg by mouth daily as needed. For seasonal allergies     FLUoxetine (PROZAC) 10 MG capsule Take 1 capsule (10 mg total) by mouth daily. (Patient taking differently: Take 10 mg by mouth at bedtime.) 90 capsule 1   fluticasone (FLONASE) 50 MCG/ACT nasal spray USE 1 SPRAY IN EACH NOSTRIL EVERY DAY (Patient taking differently: Place 1 spray into both nostrils daily as needed.) 32 g 1   gabapentin (NEURONTIN) 100 MG capsule TAKE 1 CAPSULE BY MOUTH 3 TIMES  DAILY 270 capsule 3   hydrALAZINE (APRESOLINE) 25 MG tablet TAKE 1 TABLET BY MOUTH IN THE  MORNING AND 1 TABLET BY MOUTH AT  BEDTIME 180 tablet 3   ketotifen (ZADITOR) 0.025 % ophthalmic solution Place 1 drop into both eyes 2 (two) times daily as needed.     Lidocaine 4 % PTCH Apply 1 patch topically daily as needed (pain).     Omega-3 Fatty Acids (FISH OIL) 600 MG CAPS Take 2 capsules by mouth daily.     Polyethyl Glycol-Propyl Glycol (SYSTANE OP) Apply 1 drop to eye daily as needed (dry eyes).     potassium chloride SA (KLOR-CON M) 20 MEQ tablet Take 1 tablet (20 mEq total) by mouth daily. 90 tablet 1   simvastatin (ZOCOR) 10 MG tablet TAKE 1 TABLET BY MOUTH DAILY 90 tablet 3   torsemide (DEMADEX) 20 MG tablet Take 1 tablet (20 mg total) by mouth daily. 90 tablet 3   Torsemide 40 MG TABS Take 1 tablet twice daily or 3 days. 6 tablet 0   valsartan (DIOVAN) 320 MG tablet TAKE 1 TABLET BY MOUTH DAILY 90 tablet 3   No current facility-administered medications for this visit.     Review of Systems    She denies chest pain, palpitations, dyspnea, pnd, orthopnea, n, v, dizziness, syncope, weight gain,  or early satiety. All other systems reviewed and are otherwise negative except as noted above.  Physical Exam    VS:  BP (!) 142/70 (BP Location: Left Arm, Patient Position: Sitting, Cuff Size: Large)   Pulse 69   Ht 5' 7"$  (1.702 m)   Wt 219 lb (99.3 kg)   LMP 07/11/1991   SpO2 96%   BMI 34.30 kg/m   GEN: Well nourished, well developed, in no acute distress. HEENT: normal. Neck: Supple, no JVD, carotid bruits, or masses. Cardiac: RRR, no murmurs, rubs, or gallops. No clubbing, cyanosis, none pitting bilateral lower extremity edema, left greater than right.  Radials/DP/PT 2+ and equal bilaterally.  Respiratory:  Respirations regular and unlabored, clear to auscultation bilaterally. GI: Soft, nontender, nondistended, BS + x 4. MS: no deformity or atrophy. Skin: warm and dry, no rash.  L LE redness. Neuro:  Strength and sensation are intact. Psych: Normal affect.  Accessory Clinical Findings    ECG personally reviewed by me today -sinus rhythm, 69 bpm, PACs- no acute changes.   Lab Results  Component Value Date   WBC 9.1 09/06/2021   HGB 10.5 (L) 09/06/2021   HCT 31.2 (L) 09/06/2021   MCV 100.0 09/06/2021   PLT 264.0 09/06/2021   Lab Results  Component Value Date   CREATININE 0.98 08/15/2022   BUN 13 08/15/2022   NA 140 08/15/2022   K 3.1 (L) 08/15/2022   CL 102 08/15/2022   CO2 26 08/15/2022   Lab Results  Component Value Date   ALT 9 08/15/2022   AST 15 08/15/2022   ALKPHOS 76 08/15/2022   BILITOT 0.7 08/15/2022   Lab Results  Component Value Date   CHOL 167 05/04/2020   HDL 79 05/04/2020   LDLCALC 70 05/04/2020   LDLDIRECT 110.4 12/13/2006   TRIG 94 05/04/2020   CHOLHDL 2.1 05/04/2020    Lab Results  Component Value Date   HGBA1C 5.9 02/15/2022    Assessment & Plan    1. Chronic Diastolic heart failure: Echo in 08/2020 showed EF 60 to 65%, normal LV function, no RWMA, G2 DD, normal RV systolic function, moderate elevated PASP, no significant valvular  abnormalities.  She notes a 1 month history of bilateral lower extremity edema, left greater than right.  She was treated for cellulitis in  the left lower leg.  She had stopped her Lasix for several months but restarted Lasix approximately 2 weeks ago.  She saw her PCP- BNP was elevated.  Lower extremity duplex was negative for DVT.  She has seen little improvement in her swelling since resumption of Lasix.  Her weight has been stable.  She denies dyspnea, PND, orthopnea, weight gain.  Will transition from Lasix to torsemide.  I will have her take torsemide 40 mg twice daily x 3 days followed by torsemide 20 mg daily.  I advised her to take an additional potassium pill with extra torsemide dosing.  Will check BMET, BNP in 1 week.  I suspect there is a component of lymphedema to her lower extremity swelling, would also recommend follow-up with PCP.  Discussed daily weights, sodium and fluid recommendations.  Continue valsartan, bisoprolol, hydralazine, Lasix.  2. Palpitations: Monitor in 2017 showed PACs, PVCs and transient SVT.  Denies any recent palpitations.  Stable.  Continue bisoprolol.  3. Hypertension: BP slightly elevated in office today.  BP has been well-controlled at home.  Continue current antihypertensive regimen.  4. OSA: Adherent to CPAP.  5. Obesity: Encouraged increase activity as tolerated, she is fairly sedentary.  6. Disposition: Follow-up in 1 month.   [name], NP 08/24/2022, 4:54 PM

## 2022-08-24 NOTE — Patient Instructions (Addendum)
Medication Instructions:  Stop Lasix 40 mg as directed  Start Torsemide 40 mg twice daily for 3 days. Then start Torsemide 20 mg daily Potassium 40 mEq daily for 3 days. Then resume Potassium 20 mEq daily  *If you need a refill on your cardiac medications before your next appointment, please call your pharmacy*   Lab Work: Your physician recommends that you return for lab work in 1 week. BMET & BNP  If you have labs (blood work) drawn today and your tests are completely normal, you will receive your results only by: [place] (if you have MyChart) OR A paper copy in the mail If you have any lab test that is abnormal or we need to change your treatment, we will call you to review the results.   Testing/Procedures: NONE ordered at this time of appointment      Follow-Up: At [facility], you and your health needs are our priority.  As part of our continuing mission to provide you with exceptional heart care, we have created designated Provider Care Teams.  These Care Teams include your primary Cardiologist (physician) and Advanced Practice Providers (APPs -  Physician Assistants and Nurse Practitioners) who all work together to provide you with the care you need, when you need it.  We recommend signing up for the patient portal called "MyChart".  Sign up information is provided on this After Visit Summary.  MyChart is used to connect with patients for Virtual Visits (Telemedicine).  Patients are able to view lab/test results, encounter notes, upcoming appointments, etc.  Non-urgent messages can be sent to your provider as well.   To learn more about what you can do with MyChart, go to [email or web address].    Your next appointment:   1 month(s)  Provider:   Diona Browner, NP        Other Instructions

## 2022-08-29 ENCOUNTER — Telehealth: Payer: Self-pay | Admitting: Internal Medicine

## 2022-08-29 MED ORDER — TORSEMIDE 40 MG PO TABS: 40.0000 mg | ORAL_TABLET | Freq: Every day | ORAL | 1 refills | Status: DC

## 2022-08-29 NOTE — Telephone Encounter (Signed)
Pt c/o medication issue:  1. Name of Medication:   Torsemide 40 MG TABS    2. How are you currently taking this medication (dosage and times per day)?    3. Are you having a reaction (difficulty breathing--STAT)? no  4. What is your medication issue? Patient calling with questions about medication. Please advise

## 2022-08-29 NOTE — Telephone Encounter (Signed)
Patient stated she has been on torsemide 72m daily for the past 3 days and will get BMET and BNP this coming Friday. She is able to see her knees an ankles now.

## 2022-08-29 NOTE — Telephone Encounter (Signed)
Pt advised to increase torsemide from 70m daily to 421mdaily and to continue with plan to get bloodwork on Friday.

## 2022-09-02 LAB — BASIC METABOLIC PANEL WITH GFR
BUN/Creatinine Ratio: 14 (ref 12–28)
BUN: 16 mg/dL (ref 8–27)
CO2: 22 mmol/L (ref 20–29)
Calcium: 10.4 mg/dL — ABNORMAL HIGH (ref 8.7–10.3)
Chloride: 102 mmol/L (ref 96–106)
Creatinine, Ser: 1.16 mg/dL — ABNORMAL HIGH (ref 0.57–1.00)
Glucose: 99 mg/dL (ref 70–99)
Potassium: 4.4 mmol/L (ref 3.5–5.2)
Sodium: 139 mmol/L (ref 134–144)
eGFR: 48 mL/min/1.73 — ABNORMAL LOW

## 2022-09-02 LAB — BRAIN NATRIURETIC PEPTIDE: BNP: 292.8 pg/mL — ABNORMAL HIGH (ref 0.0–100.0)

## 2022-09-04 NOTE — Progress Notes (Signed)
Subjective:   By signing my name below, I, [name], attest that this documentation has been prepared under the direction and in the presence of [name], NP 09/05/22   Patient ID: [name], female    DOB: 03/05/1941, 82 y.o.   MRN: [id number]  Chief Complaint  Patient presents with   Leg Swelling    Patient reports leg swelling going down   Cellulitis    Here for follow up cellulitis of left lower extremity    HPI Patient is in today for a 2 week follow up. She is accompanied by her son.     BLE Edema: She reports that the swelling has improved. Since last visit, she reached out to me to let me know that torsemide 20mg  did not seem to be helping. I reached out to her cardiology group and they increased her torsemide back up to 40mg  daily. She states that this dose is helping more with her swelling and she is pleased.   Breathing: She has noticed her shortness of breath has improved. She previously felt more short of breath with exertion than now.   Past Medical History:  Diagnosis Date   Allergic rhinitis    Ankle fracture    Left, Mortise Widening   Arthritis    "knuckles" (06/17/2012)   Borderline diabetes mellitus 03/06/2011   Breast cancer (HCC)    "right" (06/17/2012)   Cataracts, bilateral    Dyspnea    with exertion   Exertional dyspnea    GERD (gastroesophageal reflux disease)    Hypertension    Iron deficiency anemia    "hematologist watches it" (06/17/2012)   Obesity    OSA treated with BiPAP 03/08/2016   Osteoarthritis    severe right hip osteoarthritis-s/p hip replacement   Osteopenia 11/26/2009   Thyroid disorder    "sees endocrinologist yearly" (06/17/2012)    Past Surgical History:  Procedure Laterality Date   ANKLE FRACTURE SURGERY Right 01/01/14   Has pins. Procedure performed at [facility]   BREAST BIOPSY  ` 1992   "bilaterlly" (06/17/2012)   COLONOSCOPY W/ POLYPECTOMY     EYE SURGERY  2011   cataract removal both  eyes   HIP CLOSED REDUCTION  07/26/2012   Procedure: CLOSED MANIPULATION HIP;  Surgeon: Mable Paris, MD;  Location: [facility];  Service: Orthopedics;  Laterality: Right;   LUMBAR LAMINECTOMY  10/26/2017   LUMBAR LAMINECTOMY/DECOMPRESSION MICRODISCECTOMY Bilateral 10/25/2017   Procedure: LUMBAR 2-3, LUMBAR 3-4 DECOMPRESSION TIME REQUESTED 4.5 HOURS;  Surgeon: Estill Bamberg, MD;  [facility];  Service: Orthopedics;  Laterality: Bilateral;   MASTECTOMY  1993?   bilateral mastectomy   ORIF ANKLE FRACTURE Left 07/30/2017   Procedure: LEFT OPEN REDUCTION INTERNAL FIXATION (ORIF) ANKLE FRACTURE WITH SYDESMOTIC FIXATION;  Surgeon: Jodi Geralds, MD;  Location: [facility];  Service: Orthopedics;  Laterality: Left;   TONSILLECTOMY  1949   TOTAL HIP ARTHROPLASTY  04/2006   right hip    TOTAL HIP REVISION  06/17/2012   "right" (06/17/2012)   TOTAL HIP REVISION  06/17/2012   Procedure: TOTAL HIP REVISION;  Surgeon: [name], MD;  Location: [facility];  Service: Orthopedics;  Laterality: Right;    Family History  Problem Relation Age of Onset   Heart failure Father        deceased age 92   Diabetes Father    Alcohol abuse Father    Breast cancer Mother        deceased at age  48 secondary to breast cancer   Liver disease Sister        Liver failure--deceased   Dementia Maternal Grandmother    Colon cancer Neg Hx    Esophageal cancer Neg Hx    Rectal cancer Neg Hx    Stomach cancer Neg Hx     Social History   Socioeconomic History   Marital status: Widowed    Spouse name: Not on file   Number of children: Not on file   Years of education: Not on file   Highest education level: Not on file  Occupational History   Not on file  Tobacco Use   Smoking status: Former    Packs/day: 0.50    Years: 10.00    Total pack years: 5.00    Types: Cigarettes    Quit date: 07/14/1974    Years since quitting: 48.1   Smokeless tobacco: Never   Tobacco comments:    quit in 1976 smoked for approx.  10 years  Vaping Use   Vaping Use: Never used  Substance and Sexual Activity   Alcohol use: Yes    Comment: wine on occasion. rare.    Drug use: No   Sexual activity: Never  Other Topics Concern   Not on file  Social History Narrative   Retired   The patient is married and has one child and two grandchildren that live in the area.     She drinks wine with dinner.  She quit smoking in 1976, smoked for   approximately 10 years.       Moved to [place] from [place], [place]         Social Determinants of Health   Financial Resource Strain: Low Risk  (01/18/2021)   Overall Financial Resource Strain (CARDIA)    Difficulty of Paying Living Expenses: Not hard at all  Food Insecurity: No Food Insecurity (11/22/2021)   Hunger Vital Sign    Worried About Running Out of Food in the Last Year: Never true    Ran Out of Food in the Last Year: Never true  Transportation Needs: No Transportation Needs (11/22/2021)   PRAPARE - Administrator, Civil Service (Medical): No    Lack of Transportation (Non-Medical): No  Physical Activity: Sufficiently Active (01/18/2021)   Exercise Vital Sign    Days of Exercise per Week: 7 days    Minutes of Exercise per Session: 30 min  Stress: No Stress Concern Present (11/22/2021)   [employer] of Occupational Health - Occupational Stress Questionnaire    Feeling of Stress : Not at all  Social Connections: Moderately Integrated (11/22/2021)   Social Connection and Isolation Panel [NHANES]    Frequency of Communication with Friends and Family: More than three times a week    Frequency of Social Gatherings with Friends and Family: More than three times a week    Attends Religious Services: More than 4 times per year    Active Member of [employer] or Organizations: Yes    Attends Banker Meetings: More than 4 times per year    Marital Status: Widowed  Intimate Partner Violence: Not At Risk (11/22/2021)   Humiliation, Afraid, Rape, and Kick  questionnaire    Fear of Current or Ex-Partner: No    Emotionally Abused: No    Physically Abused: No    Sexually Abused: No    Outpatient Medications Prior to Visit  Medication Sig Dispense Refill   acetaminophen (TYLENOL) 500 MG tablet Take 1,000 mg  by mouth in the morning, at noon, and at bedtime. Take with Gabapetin     bisoprolol (ZEBETA) 10 MG tablet Take 2 tablets (20 mg total) by mouth daily. 180 tablet 3   famotidine (PEPCID) 20 MG tablet TAKE 1 TABLET BY MOUTH TWICE  DAILY 180 tablet 3   fexofenadine (ALLEGRA) 180 MG tablet Take 180 mg by mouth daily as needed. For seasonal allergies     FLUoxetine (PROZAC) 10 MG capsule Take 1 capsule (10 mg total) by mouth daily. (Patient taking differently: Take 10 mg by mouth at bedtime.) 90 capsule 1   fluticasone (FLONASE) 50 MCG/ACT nasal spray USE 1 SPRAY IN EACH NOSTRIL EVERY DAY (Patient taking differently: Place 1 spray into both nostrils daily as needed.) 32 g 1   gabapentin (NEURONTIN) 100 MG capsule TAKE 1 CAPSULE BY MOUTH 3 TIMES  DAILY 270 capsule 3   hydrALAZINE (APRESOLINE) 25 MG tablet TAKE 1 TABLET BY MOUTH IN THE  MORNING AND 1 TABLET BY MOUTH AT BEDTIME 180 tablet 3   ketotifen (ZADITOR) 0.025 % ophthalmic solution Place 1 drop into both eyes 2 (two) times daily as needed.     Lidocaine 4 % PTCH Apply 1 patch topically daily as needed (pain).     Omega-3 Fatty Acids (FISH OIL) 600 MG CAPS Take 2 capsules by mouth daily.     Polyethyl Glycol-Propyl Glycol (SYSTANE OP) Apply 1 drop to eye daily as needed (dry eyes).     potassium chloride SA (KLOR-CON M) 20 MEQ tablet Take 1 tablet (20 mEq total) by mouth daily. 90 tablet 1   simvastatin (ZOCOR) 10 MG tablet TAKE 1 TABLET BY MOUTH DAILY 90 tablet 3   Torsemide 40 MG TABS Take 40 mg by mouth daily. 90 tablet 1   valsartan (DIOVAN) 320 MG tablet TAKE 1 TABLET BY MOUTH DAILY 90 tablet 3   No facility-administered medications prior to visit.    Allergies  Allergen Reactions    Amlodipine Swelling    Swelling    Sulfonamide Derivatives Swelling    REACTION: Swelling of the face; "eyes swelled shut"   Myrbetriq [Mirabegron]     Facial swelling, sob   Chocolate Other (See Comments)    Sinus problems    Review of Systems  Respiratory:  Positive for shortness of breath (has improved).   Cardiovascular:  Positive for leg swelling.       Objective:    Physical Exam Constitutional:      General: She is not in acute distress.    Appearance: Normal appearance. She is well-developed.  HENT:     Head: Normocephalic and atraumatic.     Right Ear: External ear normal.     Left Ear: External ear normal.  Eyes:     General: No scleral icterus. Neck:     Thyroid: No thyromegaly.  Cardiovascular:     Rate and Rhythm: Normal rate and regular rhythm.     Heart sounds: Normal heart sounds. No murmur heard.    Comments: Dry skin, lower extremity scabbing, left lower lateral shin mild erythema   Pulmonary:     Effort: Pulmonary effort is normal. No respiratory distress.     Breath sounds: Normal breath sounds. No wheezing.  Musculoskeletal:     Cervical back: Neck supple.     Right lower leg: 3+ Edema present.     Left lower leg: 3+ Edema present.  Skin:    General: Skin is warm and dry.  Neurological:  Mental Status: She is alert and oriented to person, place, and time.  Psychiatric:        Mood and Affect: Mood normal.        Behavior: Behavior normal.        Thought Content: Thought content normal.        Judgment: Judgment normal.     BP (!) 132/53 (BP Location: Left Arm, Patient Position: Sitting, Cuff Size: Large)   Pulse 64   Temp 97.9 F (36.6 C) (Oral)   Resp 16   Wt 209 lb (94.8 kg)   LMP 07/11/1991   SpO2 99%   BMI 32.73 kg/m  Wt Readings from Last 3 Encounters:  09/05/22 209 lb (94.8 kg)  08/24/22 219 lb (99.3 kg)  08/15/22 225 lb (102.1 kg)       Assessment & Plan:  Chronic diastolic heart failure (HCC) Assessment &  Plan: Wt Readings from Last 3 Encounters:  09/05/22 209 lb (94.8 kg)  08/24/22 219 lb (99.3 kg)  08/15/22 225 lb (102.1 kg)   Weight has come down nicely, edema is improving.  Lab work was updated on Friday by cardiology.  Continue current dose of torsemide.     Cellulitis of left lower extremity Assessment & Plan: Clinically resolved. She still has some chronic venous skin changes and dry skin/scabbing.  Monitor.       I,Rachel Rivera,acting as a Neurosurgeon for [name], NP.,have documented all relevant documentation on the behalf of [name], NP,as directed by  [name], NP while in the presence of [name], NP.   I, [name], NP, personally preformed the services described in this documentation.  All medical record entries made by the scribe were at my direction and in my presence.  I have reviewed the chart and discharge instructions (if applicable) and agree that the record reflects my personal performance and is accurate and complete. 09/05/22   [name], NP

## 2022-09-05 ENCOUNTER — Ambulatory Visit: Payer: Medicare Other | Admitting: Family

## 2022-09-05 ENCOUNTER — Telehealth: Payer: Self-pay

## 2022-09-05 VITALS — BP 132/53 | HR 64 | Temp 97.9°F | Resp 16 | Wt 209.0 lb

## 2022-09-05 DIAGNOSIS — L03116 Cellulitis of left lower limb: Secondary | ICD-10-CM | POA: Diagnosis not present

## 2022-09-05 DIAGNOSIS — I5032 Chronic diastolic (congestive) heart failure: Secondary | ICD-10-CM | POA: Diagnosis not present

## 2022-09-05 NOTE — Telephone Encounter (Signed)
Pt is returning call in regards to results. Requesting return call.

## 2022-09-05 NOTE — Telephone Encounter (Signed)
Spoke with pt, aware of lab results. 

## 2022-09-05 NOTE — Telephone Encounter (Signed)
Lmom to discuss lab results. Waiting on a return call.

## 2022-09-05 NOTE — Assessment & Plan Note (Signed)
Clinically resolved. She still has some chronic venous skin changes and dry skin/scabbing.  Monitor.

## 2022-09-05 NOTE — Assessment & Plan Note (Signed)
Wt Readings from Last 3 Encounters:  09/05/22 209 lb (94.8 kg)  08/24/22 219 lb (99.3 kg)  08/15/22 225 lb (102.1 kg)   Weight has come down nicely, edema is improving.  Lab work was updated on Friday by cardiology.  Continue current dose of torsemide.

## 2022-09-05 NOTE — Telephone Encounter (Signed)
Noted  

## 2022-09-25 ENCOUNTER — Other Ambulatory Visit: Payer: Self-pay

## 2022-09-25 ENCOUNTER — Telehealth: Payer: Self-pay | Admitting: Internal Medicine

## 2022-09-25 ENCOUNTER — Ambulatory Visit: Payer: Medicare Other | Attending: Nurse Practitioner | Admitting: Nurse Practitioner

## 2022-09-25 VITALS — BP 132/68 | HR 60 | Ht 67.0 in | Wt 208.0 lb

## 2022-09-25 DIAGNOSIS — I5032 Chronic diastolic (congestive) heart failure: Secondary | ICD-10-CM

## 2022-09-25 DIAGNOSIS — R002 Palpitations: Secondary | ICD-10-CM

## 2022-09-25 DIAGNOSIS — I10 Essential (primary) hypertension: Secondary | ICD-10-CM | POA: Diagnosis not present

## 2022-09-25 DIAGNOSIS — G4733 Obstructive sleep apnea (adult) (pediatric): Secondary | ICD-10-CM | POA: Diagnosis not present

## 2022-09-25 MED ORDER — TORSEMIDE 20 MG PO TABS: 20.0000 mg | ORAL_TABLET | Freq: Two times a day (BID) | ORAL | 0 refills | Status: DC

## 2022-09-25 MED ORDER — TORSEMIDE 20 MG PO TABS: 20.0000 mg | ORAL_TABLET | Freq: Three times a day (TID) | ORAL | 0 refills | Status: DC

## 2022-09-25 MED ORDER — TORSEMIDE 60 MG PO TABS: 60.0000 mg | ORAL_TABLET | Freq: Every day | ORAL | 3 refills | Status: DC

## 2022-09-25 NOTE — Progress Notes (Unsigned)
Office Visit    Patient Name: [name] Date of Encounter: 09/25/2022  Primary Care Provider:  Debbrah Alar, NP Primary Cardiologist:  Pixie Casino, MD  Chief Complaint    82 year old female with a history of chronic diastolic heart failure, palpitations, hypertension, OSA, obesity, and GERD who presents for follow-up related to heart failure and hypertension.   Past Medical History    Past Medical History:  Diagnosis Date   Allergic rhinitis    Ankle fracture    Left, Mortise Widening   Arthritis    "knuckles" (06/17/2012)   Borderline diabetes mellitus 03/06/2011   Breast cancer ([place])    "right" (06/17/2012)   Cataracts, bilateral    Dyspnea    with exertion   Exertional dyspnea    GERD (gastroesophageal reflux disease)    Hypertension    Iron deficiency anemia    "hematologist watches it" (06/17/2012)   Obesity    OSA treated with BiPAP 03/08/2016   Osteoarthritis    severe right hip osteoarthritis-s/p hip replacement   Osteopenia 11/26/2009   Thyroid disorder    "sees endocrinologist yearly" (06/17/2012)   Past Surgical History:  Procedure Laterality Date   ANKLE FRACTURE SURGERY Right 01/01/14   Has pins. Procedure performed at [place]   "bilaterlly" (06/17/2012)   COLONOSCOPY W/ POLYPECTOMY     EYE SURGERY  2011   cataract removal both eyes   HIP CLOSED REDUCTION  07/26/2012   Procedure: CLOSED MANIPULATION HIP;  Surgeon: Nita Sells, MD;  Location: [facility];  Service: Orthopedics;  Laterality: Right;   LUMBAR LAMINECTOMY  10/26/2017   LUMBAR LAMINECTOMY/DECOMPRESSION MICRODISCECTOMY Bilateral 10/25/2017   Procedure: LUMBAR 2-3, LUMBAR 3-4 DECOMPRESSION TIME REQUESTED 4.5 HOURS;  Surgeon: Phylliss Bob, MD;  Location: [facility];  Service: Orthopedics;  Laterality: Bilateral;   MASTECTOMY  1993?   bilateral mastectomy   ORIF ANKLE FRACTURE Left 07/30/2017   Procedure: LEFT OPEN REDUCTION INTERNAL FIXATION  (ORIF) ANKLE FRACTURE WITH SYDESMOTIC FIXATION;  Surgeon: Dorna Leitz, MD;  Location: [facility];  Service: Orthopedics;  Laterality: Left;   TONSILLECTOMY  1949   TOTAL HIP ARTHROPLASTY  04/2006   right hip    TOTAL HIP REVISION  06/17/2012   "right" (06/17/2012)   TOTAL HIP REVISION  06/17/2012   Procedure: TOTAL HIP REVISION;  Surgeon: Kerin Salen, MD;  Location: [place];  Service: Orthopedics;  Laterality: Right;    Allergies  Allergies  Allergen Reactions   Amlodipine Swelling    Swelling    Sulfonamide Derivatives Swelling    REACTION: Swelling of the face; "eyes swelled shut"   Myrbetriq [Mirabegron]     Facial swelling, sob   Chocolate Other (See Comments)    Sinus problems     Labs/Other Studies Reviewed    The following studies were reviewed today: Myoview 12/01/2015 The left ventricular ejection fraction is normal (55-65%). Nuclear stress EF: 64%. No wall motion abnormalities There was no ST segment deviation noted during stress. Bowel loop artifact noted during stress. This is a low risk study. No ischemia identified.   Echo 09/06/2020  1. Left ventricular ejection fraction, by estimation, is 60 to 65%. The  left ventricle has normal function. The left ventricle has no regional  wall motion abnormalities. Left ventricular diastolic parameters are  consistent with Grade II diastolic  dysfunction (pseudonormalization). Elevated left atrial pressure.   2. Right ventricular systolic function is normal. The right ventricular  size is  normal. There is moderately elevated pulmonary artery systolic  pressure. The estimated right ventricular systolic pressure is 123456 mmHg.   3. Left atrial size was mildly dilated.   4. The mitral valve is normal in structure. Trivial mitral valve  regurgitation. No evidence of mitral stenosis.   5. Tricuspid valve regurgitation is moderate.   6. The aortic valve is tricuspid. Aortic valve regurgitation is not  visualized. No aortic stenosis  is present.   7. The inferior vena cava is normal in size with greater than 50%  respiratory variability, suggesting right atrial pressure of 3 mmHg.     Recent Labs: 08/15/2022: ALT 9; Pro B Natriuretic peptide (BNP) 760.0 09/01/2022: BNP 292.8; BUN 16; Creatinine, Ser 1.16; Potassium 4.4; Sodium 139  Recent Lipid Panel    Component Value Date/Time   CHOL 167 05/04/2020 1402   TRIG 94 05/04/2020 1402   HDL 79 05/04/2020 1402   CHOLHDL 2.1 05/04/2020 1402   VLDL 14.2 12/25/2018 0854   LDLCALC 70 05/04/2020 1402   LDLDIRECT 110.4 12/13/2006 0900    History of Present Illness    82 year old female with the above past medical history including chronic diastolic heart failure, palpitations, hypertension, OSA, obesity, and GERD.   She was hospitalized in January 2016 in the setting of acute heart failure.  Echocardiogram showed normal systolic and diastolic function.  She improved with diuresis. Cardiac monitor in the setting of palpitations showed PACs, PVCs, and a transient episode of SVT.  Myoview in 2017 showed EF 64%, no ischemia, overall low risk.  She was last seen in the office on 08/24/2022 and was stable overall from a cardiac standpoint.  She did note increased bilateral lower extremity edema in the setting of having stopped taking her Lasix.  She noted she was previously treated for left lower extremity cellulitis.  Lower extremity duplex was negative for DVT.  She was transitioned from Lasix to torsemide.  Follow-up was recommended with PCP for possible component of lymphedema.   She presents today for follow-up companied by her son.  Since her last visit she has done well from a cardiac standpoint.  Her lower extremity edema has improved significantly with transition from Lasix to torsemide.  She still has some nonpitting bilateral lower extremity edema.  She denies chest pain, dyspnea, palpitations, PND, orthopnea, weight gain.  Will check BMET today.  Will increase torsemide to 60 mg  daily.  Will repeat BMET in 1 week.  Follow-up in 3 months.  She notes occasional lightheadedness.  Discussed adequate hydration (fluid recommendation of 2 L daily).  BP well controlled.    1. Chronic Diastolic heart failure: Echo in 08/2020 showed EF 60 to 65%, normal LV function, no RWMA, G2 DD, normal RV systolic function, moderate elevated PASP, no significant valvular abnormalities.  She notes a 1 month history of bilateral lower extremity edema, left greater than right.  She was treated for cellulitis in the left lower leg.  She had stopped her Lasix for several months but restarted Lasix approximately 2 weeks ago.  She saw her PCP- BNP was elevated.  Lower extremity duplex was negative for DVT.  She has seen little improvement in her swelling since resumption of Lasix.  Her weight has been stable.  She denies dyspnea, PND, orthopnea, weight gain.  Will transition from Lasix to torsemide.  I will have her take torsemide 40 mg twice daily x 3 days followed by torsemide 20 mg daily.  I advised her to take an additional  potassium pill with extra torsemide dosing.  Will check BMET, BNP in 1 week.  I suspect there is a component of lymphedema to her lower extremity swelling, would also recommend follow-up with PCP.  Discussed daily weights, sodium and fluid recommendations.  Continue valsartan, bisoprolol, hydralazine, Lasix.   2. Palpitations: Monitor in 2017 showed PACs, PVCs and transient SVT.  Denies any recent palpitations.  Stable.  Continue bisoprolol.   3. Hypertension: BP slightly elevated in office today.  BP has been well-controlled at home.  Continue current antihypertensive regimen.   4. OSA: Adherent to CPAP.   5. Obesity: Encouraged increased activity as tolerated, she is fairly sedentary.   6. Disposition: Follow-up in   Home Medications    Current Outpatient Medications  Medication Sig Dispense Refill   acetaminophen (TYLENOL) 500 MG tablet Take 1,000 mg by mouth in the morning,  at noon, and at bedtime. Take with Gabapetin     bisoprolol (ZEBETA) 10 MG tablet Take 2 tablets (20 mg total) by mouth daily. 180 tablet 3   famotidine (PEPCID) 20 MG tablet TAKE 1 TABLET BY MOUTH TWICE  DAILY 180 tablet 3   fexofenadine (ALLEGRA) 180 MG tablet Take 180 mg by mouth daily as needed. For seasonal allergies     FLUoxetine (PROZAC) 10 MG capsule Take 1 capsule (10 mg total) by mouth daily. (Patient taking differently: Take 10 mg by mouth at bedtime.) 90 capsule 1   fluticasone (FLONASE) 50 MCG/ACT nasal spray USE 1 SPRAY IN EACH NOSTRIL EVERY DAY (Patient taking differently: Place 1 spray into both nostrils daily as needed.) 32 g 1   gabapentin (NEURONTIN) 100 MG capsule TAKE 1 CAPSULE BY MOUTH 3 TIMES  DAILY 270 capsule 3   hydrALAZINE (APRESOLINE) 25 MG tablet TAKE 1 TABLET BY MOUTH IN THE  MORNING AND 1 TABLET BY MOUTH AT BEDTIME 180 tablet 3   ketotifen (ZADITOR) 0.025 % ophthalmic solution Place 1 drop into both eyes 2 (two) times daily as needed.     Lidocaine 4 % PTCH Apply 1 patch topically daily as needed (pain).     Omega-3 Fatty Acids (FISH OIL) 600 MG CAPS Take 2 capsules by mouth daily.     Polyethyl Glycol-Propyl Glycol (SYSTANE OP) Apply 1 drop to eye daily as needed (dry eyes).     potassium chloride SA (KLOR-CON M) 20 MEQ tablet Take 1 tablet (20 mEq total) by mouth daily. 90 tablet 1   simvastatin (ZOCOR) 10 MG tablet TAKE 1 TABLET BY MOUTH DAILY 90 tablet 3   Torsemide 40 MG TABS Take 40 mg by mouth daily. 90 tablet 1   valsartan (DIOVAN) 320 MG tablet TAKE 1 TABLET BY MOUTH DAILY 90 tablet 3   No current facility-administered medications for this visit.     Review of Systems    She denies chest pain, palpitations, dyspnea, pnd, orthopnea, n, v, dizziness, syncope, weight gain, or early satiety. All other systems reviewed and are otherwise negative except as noted above.   Physical Exam    VS:  BP 132/68   Pulse 60   Ht 5\' 7"  (1.702 m)   Wt 208 lb (94.3  kg)   LMP 07/11/1991   SpO2 97%   BMI 32.58 kg/m  GEN: Well nourished, well developed, in no acute distress. HEENT: normal. Neck: Supple, no JVD, carotid bruits, or masses. Cardiac: RRR, no murmurs, rubs, or gallops. No clubbing, cyanosis, edema.  Radials/DP/PT 2+ and equal bilaterally.  Respiratory:  Respirations regular and  unlabored, clear to auscultation bilaterally. GI: Soft, nontender, nondistended, BS + x 4. MS: no deformity or atrophy. Skin: warm and dry, no rash. Neuro:  Strength and sensation are intact. Psych: Normal affect.  Accessory Clinical Findings    ECG personally reviewed by me today - No EKG in office today.   Lab Results  Component Value Date   WBC 9.1 09/06/2021   HGB 10.5 (L) 09/06/2021   HCT 31.2 (L) 09/06/2021   MCV 100.0 09/06/2021   PLT 264.0 09/06/2021   Lab Results  Component Value Date   CREATININE 1.16 (H) 09/01/2022   BUN 16 09/01/2022   NA 139 09/01/2022   K 4.4 09/01/2022   CL 102 09/01/2022   CO2 22 09/01/2022   Lab Results  Component Value Date   ALT 9 08/15/2022   AST 15 08/15/2022   ALKPHOS 76 08/15/2022   BILITOT 0.7 08/15/2022   Lab Results  Component Value Date   CHOL 167 05/04/2020   HDL 79 05/04/2020   LDLCALC 70 05/04/2020   LDLDIRECT 110.4 12/13/2006   TRIG 94 05/04/2020   CHOLHDL 2.1 05/04/2020    Lab Results  Component Value Date   HGBA1C 5.9 02/15/2022    Assessment & Plan    1.  ***      [name], NP 09/25/2022, 11:10 AM

## 2022-09-25 NOTE — Telephone Encounter (Signed)
Pt c/o medication issue:  1. Name of Medication: torsemide (DEMADEX) 20 MG tablet   2. How are you currently taking this medication (dosage and times per day)? As prescribed  3. Are you having a reaction (difficulty breathing--STAT)? No  4. What is your medication issue? Pt states that with her visit today with Diona Browner, NP - the dosage on the medication was increased from x2 daily to x3 daily. The pt states that her insurance won't cover with how it is currently written. Pt states that a new script needs to be sent to [id number] - [place], [place] - [address]

## 2022-09-25 NOTE — Patient Instructions (Addendum)
Medication Instructions:  Increase Torsemide 60 mg daily as directed  *If you need a refill on your cardiac medications before your next appointment, please call your pharmacy*   Lab Work: Your physician recommends that you complete lab work today and return for lab work in 1 weeks. BMET (today & 1 week)  If you have labs (blood work) drawn today and your tests are completely normal, you will receive your results only by: [place] (if you have MyChart) OR A paper copy in the mail If you have any lab test that is abnormal or we need to change your treatment, we will call you to review the results.   Testing/Procedures: NONE ordered at this time of appointment     Follow-Up: At [facility], you and your health needs are our priority.  As part of our continuing mission to provide you with exceptional heart care, we have created designated Provider Care Teams.  These Care Teams include your primary Cardiologist (physician) and Advanced Practice Providers (APPs -  Physician Assistants and Nurse Practitioners) who all work together to provide you with the care you need, when you need it.  We recommend signing up for the patient portal called "MyChart".  Sign up information is provided on this After Visit Summary.  MyChart is used to connect with patients for Virtual Visits (Telemedicine).  Patients are able to view lab/test results, encounter notes, upcoming appointments, etc.  Non-urgent messages can be sent to your provider as well.   To learn more about what you can do with MyChart, go to [email or web address].    Your next appointment:   3 month(s)  Provider:   Diona Browner, NP        Other Instructions

## 2022-09-25 NOTE — Telephone Encounter (Signed)
Per OV note from today with Diona Browner NP  . I will have her take torsemide 40 mg twice daily x 3 days followed by torsemide 20 mg daily.   Torsemide 60mg  daily sent to pharmacy- and a script for Torsemide 20mg  BID sent to pharmacy  Will confirm dosage with NP.

## 2022-09-25 NOTE — Telephone Encounter (Signed)
Spoke with pts pharmacy and I changed the Torsemide 20 mg TID so insurance would cover it. Pt is aware that she is to take 60 mg daily of Torsemide as discussed at her office visit today with Diona Browner, NP.

## 2022-09-26 ENCOUNTER — Other Ambulatory Visit: Payer: Self-pay | Admitting: Family

## 2022-09-26 ENCOUNTER — Encounter: Payer: Self-pay | Admitting: Nurse Practitioner

## 2022-09-26 ENCOUNTER — Other Ambulatory Visit: Payer: Self-pay | Admitting: Family Medicine

## 2022-09-26 LAB — BASIC METABOLIC PANEL
BUN/Creatinine Ratio: 27 (ref 12–28)
BUN: 39 mg/dL — ABNORMAL HIGH (ref 8–27)
CO2: 22 mmol/L (ref 20–29)
Calcium: 10.8 mg/dL — ABNORMAL HIGH (ref 8.7–10.3)
Chloride: 104 mmol/L (ref 96–106)
Creatinine, Ser: 1.42 mg/dL — ABNORMAL HIGH (ref 0.57–1.00)
Glucose: 131 mg/dL — ABNORMAL HIGH (ref 70–99)
Potassium: 5 mmol/L (ref 3.5–5.2)
Sodium: 140 mmol/L (ref 134–144)
eGFR: 38 mL/min/{1.73_m2} — ABNORMAL LOW (ref >59)

## 2022-10-03 LAB — BASIC METABOLIC PANEL
BUN/Creatinine Ratio: 22 (ref 12–28)
BUN: 40 mg/dL — ABNORMAL HIGH (ref 8–27)
CO2: 18 mmol/L — ABNORMAL LOW (ref 20–29)
Calcium: 10.6 mg/dL — ABNORMAL HIGH (ref 8.7–10.3)
Chloride: 101 mmol/L (ref 96–106)
Creatinine, Ser: 1.78 mg/dL — ABNORMAL HIGH (ref 0.57–1.00)
Glucose: 93 mg/dL (ref 70–99)
Potassium: 4.7 mmol/L (ref 3.5–5.2)
Sodium: 135 mmol/L (ref 134–144)
eGFR: 29 mL/min/{1.73_m2} — ABNORMAL LOW (ref >59)

## 2022-10-06 ENCOUNTER — Telehealth: Payer: Self-pay

## 2022-10-06 NOTE — Telephone Encounter (Signed)
Pt returned call. Pt was notified of lab results and will reduce Torsemide to 40 mg daily and add an additional 20 mg as needed for weight gain, shortness of breath.

## 2022-10-06 NOTE — Telephone Encounter (Signed)
Lmom to discuss lab results. Waiting on a return call.  

## 2022-10-20 ENCOUNTER — Other Ambulatory Visit: Payer: Self-pay | Admitting: Nurse Practitioner

## 2022-10-24 ENCOUNTER — Other Ambulatory Visit: Payer: Self-pay | Admitting: Nurse Practitioner

## 2022-11-16 ENCOUNTER — Other Ambulatory Visit: Payer: Self-pay | Admitting: Nurse Practitioner

## 2022-11-27 ENCOUNTER — Other Ambulatory Visit (HOSPITAL_COMMUNITY): Payer: Self-pay

## 2022-11-28 ENCOUNTER — Ambulatory Visit: Payer: Medicare Other

## 2022-11-30 ENCOUNTER — Other Ambulatory Visit: Payer: Self-pay | Admitting: Family

## 2022-12-05 ENCOUNTER — Ambulatory Visit (INDEPENDENT_AMBULATORY_CARE_PROVIDER_SITE_OTHER): Payer: Medicare Other | Admitting: *Deleted

## 2022-12-05 ENCOUNTER — Ambulatory Visit: Payer: Medicare Other | Admitting: Family

## 2022-12-05 VITALS — BP 119/55 | HR 101 | Temp 97.8°F | Resp 16 | Wt 209.6 lb

## 2022-12-05 DIAGNOSIS — F32A Depression, unspecified: Secondary | ICD-10-CM

## 2022-12-05 DIAGNOSIS — D649 Anemia, unspecified: Secondary | ICD-10-CM | POA: Diagnosis not present

## 2022-12-05 DIAGNOSIS — N1832 Chronic kidney disease, stage 3b: Secondary | ICD-10-CM

## 2022-12-05 DIAGNOSIS — K219 Gastro-esophageal reflux disease without esophagitis: Secondary | ICD-10-CM

## 2022-12-05 DIAGNOSIS — Z0000 Encounter for general adult medical examination without abnormal findings: Secondary | ICD-10-CM

## 2022-12-05 DIAGNOSIS — E213 Hyperparathyroidism, unspecified: Secondary | ICD-10-CM

## 2022-12-05 DIAGNOSIS — R7303 Prediabetes: Secondary | ICD-10-CM | POA: Diagnosis not present

## 2022-12-05 DIAGNOSIS — N3281 Overactive bladder: Secondary | ICD-10-CM

## 2022-12-05 DIAGNOSIS — G4700 Insomnia, unspecified: Secondary | ICD-10-CM | POA: Diagnosis not present

## 2022-12-05 DIAGNOSIS — E785 Hyperlipidemia, unspecified: Secondary | ICD-10-CM

## 2022-12-05 DIAGNOSIS — Z1159 Encounter for screening for other viral diseases: Secondary | ICD-10-CM

## 2022-12-05 DIAGNOSIS — J301 Allergic rhinitis due to pollen: Secondary | ICD-10-CM

## 2022-12-05 DIAGNOSIS — G4733 Obstructive sleep apnea (adult) (pediatric): Secondary | ICD-10-CM

## 2022-12-05 LAB — CBC WITH DIFFERENTIAL/PLATELET
Basophils Absolute: 0.1 10*3/uL (ref 0.0–0.1)
Basophils Relative: 0.7 % (ref 0.0–3.0)
Eosinophils Absolute: 0.1 10*3/uL (ref 0.0–0.7)
Eosinophils Relative: 1.4 % (ref 0.0–5.0)
HCT: 30.6 % — ABNORMAL LOW (ref 36.0–46.0)
Hemoglobin: 10.1 g/dL — ABNORMAL LOW (ref 12.0–15.0)
Lymphocytes Relative: 31.2 % (ref 12.0–46.0)
Lymphs Abs: 2.6 10*3/uL (ref 0.7–4.0)
MCHC: 32.9 g/dL (ref 30.0–36.0)
MCV: 99.7 fl (ref 78.0–100.0)
Monocytes Absolute: 0.5 10*3/uL (ref 0.1–1.0)
Monocytes Relative: 6.1 % (ref 3.0–12.0)
Neutro Abs: 5.1 10*3/uL (ref 1.4–7.7)
Neutrophils Relative %: 60.6 % (ref 43.0–77.0)
Platelets: 248 10*3/uL (ref 150.0–400.0)
RBC: 3.07 Mil/uL — ABNORMAL LOW (ref 3.87–5.11)
RDW: 14.8 % (ref 11.5–15.5)
WBC: 8.4 10*3/uL (ref 4.0–10.5)

## 2022-12-05 LAB — COMPREHENSIVE METABOLIC PANEL
ALT: 24 U/L (ref 0–35)
AST: 29 U/L (ref 0–37)
Albumin: 4.1 g/dL (ref 3.5–5.2)
Alkaline Phosphatase: 78 U/L (ref 39–117)
BUN: 49 mg/dL — ABNORMAL HIGH (ref 6–23)
CO2: 21 mEq/L (ref 19–32)
Calcium: 10.2 mg/dL (ref 8.4–10.5)
Chloride: 104 mEq/L (ref 96–112)
Creatinine, Ser: 1.79 mg/dL — ABNORMAL HIGH (ref 0.40–1.20)
GFR: 26.6 mL/min — ABNORMAL LOW (ref >60.00)
Glucose, Bld: 126 mg/dL — ABNORMAL HIGH (ref 70–99)
Potassium: 4.7 mEq/L (ref 3.5–5.1)
Sodium: 135 mEq/L (ref 135–145)
Total Bilirubin: 0.5 mg/dL (ref 0.2–1.2)
Total Protein: 6.9 g/dL (ref 6.0–8.3)

## 2022-12-05 LAB — LIPID PANEL
Cholesterol: 137 mg/dL (ref 0–200)
HDL: 67 mg/dL (ref >39.00)
LDL Cholesterol: 51 mg/dL (ref 0–99)
NonHDL: 69.6
Total CHOL/HDL Ratio: 2
Triglycerides: 95 mg/dL (ref 0.0–149.0)
VLDL: 19 mg/dL (ref 0.0–40.0)

## 2022-12-05 LAB — HEMOGLOBIN A1C: Hgb A1c MFr Bld: 5.8 % (ref 4.6–6.5)

## 2022-12-05 NOTE — Assessment & Plan Note (Signed)
Lab Results  Component Value Date   HGBA1C 5.9 02/15/2022   HGBA1C 5.9 09/06/2021   HGBA1C 5.7 (H) 05/04/2020   Lab Results  Component Value Date   LDLCALC 70 05/04/2020   CREATININE 1.78 (H) 10/02/2022

## 2022-12-05 NOTE — Assessment & Plan Note (Signed)
Lab Results  Component Value Date   CHOL 167 05/04/2020   HDL 79 05/04/2020   LDLCALC 70 05/04/2020   LDLDIRECT 110.4 12/13/2006   TRIG 94 05/04/2020   CHOLHDL 2.1 05/04/2020   Tolerating simvastatin, continue same, update lipid panel.

## 2022-12-05 NOTE — Progress Notes (Signed)
Subjective:  Pt completed ADLs and SDOH during e-check in on 11/28/22, Fall risk done during office visit with PCP on 12/05/22. Answers verified with pt.    [name] is a 82 y.o. female who presents for Medicare Annual (Subsequent) preventive examination.  I connected with  [name] on 12/05/22 by a audio enabled telemedicine application and verified that I am speaking with the correct person using two identifiers.  Patient Location: Home  Provider Location: Office/Clinic  I discussed the limitations of evaluation and management by telemedicine. The patient expressed understanding and agreed to proceed.   Review of Systems     Cardiac Risk Factors include: advanced age (>77men, >65 women);dyslipidemia;hypertension;obesity (BMI >30kg/m2)     Objective:    There were no vitals filed for this visit. There is no height or weight on file to calculate BMI.     12/05/2022    3:02 PM 11/22/2021   11:28 AM 05/01/2019   11:11 AM 03/19/2018    1:20 PM 10/18/2017    1:25 PM 07/30/2017    9:52 AM 08/10/2016   10:58 AM  Advanced Directives  Does Patient Have a Medical Advance Directive? Yes Yes Yes Yes Yes Yes Yes  Type of Estate agent of [place];Living will Healthcare Power of [place];Living will;Out of facility DNR (pink MOST or yellow form) Healthcare Power of [place];Living will Healthcare Power of [place];Living will Healthcare Power of [place];Living will Living will Healthcare Power of [place];Living will  Does patient want to make changes to medical advance directive?  No - Patient declined No - Patient declined   No - Patient declined   Copy of Healthcare Power of Attorney in Chart? Yes - validated most recent copy scanned in chart (See row information) Yes - validated most recent copy scanned in chart (See row information) Yes - validated most recent copy scanned in chart (See row information) Yes Yes  Yes    Current Medications  (verified) Outpatient Encounter Medications as of 12/05/2022  Medication Sig   acetaminophen (TYLENOL) 500 MG tablet Take 1,000 mg by mouth in the morning, at noon, and at bedtime. Take with Gabapetin   bisoprolol (ZEBETA) 10 MG tablet TAKE 2 TABLETS BY MOUTH DAILY   famotidine (PEPCID) 20 MG tablet TAKE 1 TABLET BY MOUTH TWICE  DAILY   fexofenadine (ALLEGRA) 180 MG tablet Take 180 mg by mouth daily as needed. For seasonal allergies   FLUoxetine (PROZAC) 10 MG capsule Take 1 capsule (10 mg total) by mouth daily. (Patient taking differently: Take 10 mg by mouth at bedtime.)   fluticasone (FLONASE) 50 MCG/ACT nasal spray USE 1 SPRAY IN EACH NOSTRIL EVERY DAY (Patient taking differently: Place 1 spray into both nostrils daily as needed.)   gabapentin (NEURONTIN) 100 MG capsule TAKE 1 CAPSULE BY MOUTH 3 TIMES  DAILY   hydrALAZINE (APRESOLINE) 25 MG tablet TAKE 1 TABLET BY MOUTH IN THE  MORNING AND 1 TABLET BY MOUTH AT BEDTIME   ketotifen (ZADITOR) 0.025 % ophthalmic solution Place 1 drop into both eyes 2 (two) times daily as needed.   Lidocaine 4 % PTCH Apply 1 patch topically daily as needed (pain).   Omega-3 Fatty Acids (FISH OIL) 600 MG CAPS Take 2 capsules by mouth daily.   Polyethyl Glycol-Propyl Glycol (SYSTANE OP) Apply 1 drop to eye daily as needed (dry eyes).   potassium chloride SA (KLOR-CON M) 20 MEQ tablet Take 1 tablet (20 mEq total) by mouth daily.   simvastatin (ZOCOR) 10 MG tablet  Take 1 tablet (10 mg total) by mouth daily.   torsemide (DEMADEX) 20 MG tablet Take 40 mg daily. May add an additional 20 mg on the days of swelling, weight gain or SOB.   valsartan (DIOVAN) 320 MG tablet Take 1 tablet (320 mg total) by mouth daily.   No facility-administered encounter medications on file as of 12/05/2022.    Allergies (verified) Amlodipine, Sulfonamide derivatives, Myrbetriq [mirabegron], and Chocolate   History: Past Medical History:  Diagnosis Date   Allergic rhinitis    Allergy     Ankle fracture    Left, Mortise [place]   Anxiety 2020   Arthritis    "knuckles" (06/17/2012)   Borderline diabetes mellitus 03/06/2011   Breast cancer (HCC)    "right" (06/17/2012)   Cataracts, bilateral    Depression 2020   Dyspnea    with exertion   Exertional dyspnea    GERD (gastroesophageal reflux disease)    Hypertension    Iron deficiency anemia    "hematologist watches it" (06/17/2012)   Obesity    OSA treated with BiPAP 03/08/2016   Osteoarthritis    severe right hip osteoarthritis-s/p hip replacement   Osteopenia 11/26/2009   Thyroid disorder    "sees endocrinologist yearly" (06/17/2012)   Past Surgical History:  Procedure Laterality Date   ANKLE FRACTURE SURGERY Right 01/01/2014   Has pins. Procedure performed at [facility]   BREAST BIOPSY  ` 1992   "bilaterlly" (06/17/2012)   COLONOSCOPY W/ POLYPECTOMY     EYE SURGERY  2011   cataract removal both eyes   FRACTURE SURGERY     HIP CLOSED REDUCTION  07/26/2012   Procedure: CLOSED MANIPULATION HIP;  Surgeon: Mable Paris, MD;  Location: [facility];  Service: Orthopedics;  Laterality: Right;   JOINT REPLACEMENT  2007   LUMBAR LAMINECTOMY  10/26/2017   LUMBAR LAMINECTOMY/DECOMPRESSION MICRODISCECTOMY Bilateral 10/25/2017   Procedure: LUMBAR 2-3, LUMBAR 3-4 DECOMPRESSION TIME REQUESTED 4.5 HOURS;  Surgeon: Estill Bamberg, MD;  Location: [facility] OR;  Service: Orthopedics;  Laterality: Bilateral;   MASTECTOMY  1993?   bilateral mastectomy   ORIF ANKLE FRACTURE Left 07/30/2017   Procedure: LEFT OPEN REDUCTION INTERNAL FIXATION (ORIF) ANKLE FRACTURE WITH SYDESMOTIC FIXATION;  Surgeon: Jodi Geralds, MD;  Location: [facility] OR;  Service: Orthopedics;  Laterality: Left;   TONSILLECTOMY  1949   TOTAL HIP ARTHROPLASTY  04/2006   right hip    TOTAL HIP REVISION  06/17/2012   "right" (06/17/2012)   TOTAL HIP REVISION  06/17/2012   Procedure: TOTAL HIP REVISION;  Surgeon: [name], MD;  Location: [facility] OR;  Service:  Orthopedics;  Laterality: Right;   Family History  Problem Relation Age of Onset   Heart failure Father        deceased age 78   Diabetes Father    Alcohol abuse Father    Breast cancer Mother        deceased at age 99 secondary to breast cancer   Liver disease Sister        Liver failure--deceased   Dementia Maternal Grandmother    Colon cancer Neg Hx    Esophageal cancer Neg Hx    Rectal cancer Neg Hx    Stomach cancer Neg Hx    Social History   Socioeconomic History   Marital status: Widowed    Spouse name: Not on file   Number of children: Not on file   Years of education: Not on file   Highest education level: Bachelor's  degree (e.g., BA, AB, BS)  Occupational History   Not on file  Tobacco Use   Smoking status: Former    Packs/day: 0.50    Years: 10.00    Additional pack years: 0.00    Total pack years: 5.00    Types: Cigarettes    Quit date: 07/14/1974    Years since quitting: 48.4   Smokeless tobacco: Never   Tobacco comments:    quit in 1976 smoked for approx. 10 years  Vaping Use   Vaping Use: Never used  Substance and Sexual Activity   Alcohol use: Yes    Alcohol/week: 4.0 standard drinks of alcohol    Types: 4 Glasses of wine per week    Comment: Glass of wine once in awhile.   Drug use: No   Sexual activity: Never  Other Topics Concern   Not on file  Social History Narrative   Retired   The patient is married and has one child and two grandchildren that live in the area.     She drinks wine with dinner.  She quit smoking in 1976, smoked for   approximately 10 years.       Moved to [place] from [place], [place]         Social Determinants of Health   Financial Resource Strain: Low Risk  (11/28/2022)   Overall Financial Resource Strain (CARDIA)    Difficulty of Paying Living Expenses: Not hard at all  Food Insecurity: No Food Insecurity (11/28/2022)   Hunger Vital Sign    Worried About Running Out of Food in the Last Year: Never true    Ran Out  of Food in the Last Year: Never true  Transportation Needs: No Transportation Needs (11/28/2022)   PRAPARE - Administrator, Civil Service (Medical): No    Lack of Transportation (Non-Medical): No  Physical Activity: Inactive (11/28/2022)   Exercise Vital Sign    Days of Exercise per Week: 0 days    Minutes of Exercise per Session: 0 min  Stress: No Stress Concern Present (11/28/2022)   [employer] of Occupational Health - Occupational Stress Questionnaire    Feeling of Stress : Only a little  Social Connections: Socially Isolated (11/28/2022)   Social Connection and Isolation Panel [NHANES]    Frequency of Communication with Friends and Family: Once a week    Frequency of Social Gatherings with Friends and Family: Once a week    Attends Religious Services: Never    Database administrator or Organizations: No    Attends Banker Meetings: Never    Marital Status: Widowed    Tobacco Counseling Counseling given: Not Answered Tobacco comments: quit in 1976 smoked for approx. 10 years   Clinical Intake:  Pre-visit preparation completed: Yes  Pain : No/denies pain  BMI - recorded: 32.83 Nutritional Status: BMI > 30  Obese Nutritional Risks: None Diabetes: No  How often do you need to have someone help you when you read instructions, pamphlets, or other written materials from your doctor or pharmacy?: 1 - Never   Activities of Daily Living    11/28/2022    2:43 PM  In your present state of health, do you have any difficulty performing the following activities:  Hearing? 0  Vision? 0  Difficulty concentrating or making decisions? 0  Walking or climbing stairs? 1  Dressing or bathing? 0  Doing errands, shopping? 1  Preparing Food and eating ? N  Using the Toilet? N  In the past six months, have you accidently leaked urine? Y  Do you have problems with loss of bowel control? N  Managing your Medications? N  Managing your Finances? N   Housekeeping or managing your Housekeeping? N    Patient Care Team: Sandford Craze, NP as PCP - General (Internal Medicine) Rennis Golden Lisette Abu, MD as PCP - Cardiology (Cardiology) Josph Macho, MD as Consulting Physician (Hematology and Oncology) Janet Berlin, MD as Consulting Physician (Ophthalmology) Gean Birchwood, MD as Consulting Physician (Orthopedic Surgery) Rennis Golden Lisette Abu, MD as Consulting Physician (Cardiology) Lennette Bihari, MD as Consulting Physician (Cardiology) Estill Bamberg, MD as Consulting Physician (Orthopedic Surgery) Henrene Pastor, RPH-CPP (Pharmacist)  Indicate any recent Medical Services you may have received from other than [facility] providers in the past year (date may be approximate).     Assessment:   This is a routine wellness examination for Melvin.  Hearing/Vision screen No results found.  Dietary issues and exercise activities discussed: Current Exercise Habits: The patient does not participate in regular exercise at present, Exercise limited by: orthopedic condition(s)   Goals Addressed   None    Depression Screen    12/05/2022   10:53 AM 11/22/2021   11:07 AM 09/06/2021    1:48 PM 03/01/2021    2:14 PM 03/01/2021    2:13 PM 11/16/2020    3:54 PM 07/21/2020   10:59 AM  PHQ 2/9 Scores  PHQ - 2 Score 1 0 2 0 0 2 0  PHQ- 9 Score   4 0  4 3    Fall Risk    12/05/2022   10:54 AM 11/28/2022    2:43 PM 11/22/2021   11:06 AM 01/18/2021   11:21 AM 11/16/2020    3:54 PM  Fall Risk   Falls in the past year? 0 0 1 1 1   Number falls in past yr: 0 0 0 0 0  Injury with Fall? 0 0 0 1 1  Risk for fall due to : No Fall Risks  History of fall(s) History of fall(s);Impaired balance/gait   Risk for fall due to: Comment    using walker or cane   Follow up Falls evaluation completed  Falls evaluation completed Falls evaluation completed;Falls prevention discussed     FALL RISK PREVENTION PERTAINING TO THE HOME:  Any stairs in or around the home?  No  Home free of loose throw rugs in walkways, pet beds, electrical cords, etc? Yes  Adequate lighting in your home to reduce risk of falls? Yes   ASSISTIVE DEVICES UTILIZED TO PREVENT FALLS:  Life alert? No  Use of a cane, walker or w/c? Yes  Grab bars in the bathroom? Yes  Shower chair or bench in shower? Yes  Elevated toilet seat or a handicapped toilet? Yes   TIMED UP AND GO:  Was the test performed?  No, audio visit .   Cognitive Function:    08/10/2016   11:05 AM 08/06/2015   11:24 AM  MMSE - Mini Mental State Exam  Orientation to time 5 5  Orientation to Place 5 5  Registration 3 3  Attention/ Calculation 5 5  Recall 3 3  Language- name 2 objects 2 2  Language- repeat 1 1  Language- follow 3 step command 3 3  Language- read & follow direction 1 1  Write a sentence 1 1  Copy design 1 1  Total score 30 30        12/05/2022    3:09  PM 11/22/2021   11:12 AM  6CIT Screen  What Year? 0 points 0 points  What month? 0 points 0 points  What time? 0 points 0 points  Count back from 20 4 points 0 points  Months in reverse 0 points 0 points  Repeat phrase 0 points 0 points  Total Score 4 points 0 points    Immunizations Immunization History  Administered Date(s) Administered   Influenza Split 03/22/2012   Influenza Whole 04/28/2008, 05/04/2009   Influenza, High Dose Seasonal PF 04/01/2015, 03/27/2016, 03/13/2017, 04/02/2018, 04/04/2019, 03/24/2020, 04/13/2021   Influenza,inj,Quad PF,6+ Mos 03/19/2013, 03/25/2014   Influenza-Unspecified 04/10/2022   Moderna Covid-19 Vaccine Bivalent Booster 24yrs & up 07/28/2021   PFIZER(Purple Top)SARS-COV-2 Vaccination 09/11/2019, 09/30/2019, 04/23/2020   Pneumococcal Conjugate-13 06/25/2013   Pneumococcal Polysaccharide-23 05/27/2001, 06/18/2012   Td 05/26/2008   Tdap 07/28/2020   Unspecified SARS-COV-2 Vaccination 04/10/2022   Zoster Recombinat (Shingrix) 07/28/2020, 01/28/2021   Zoster, Live 12/01/2010    TDAP status: Up  to date  Flu Vaccine status: Up to date  Pneumococcal vaccine status: Up to date  Covid-19 vaccine status: Information provided on how to obtain vaccines.   Qualifies for Shingles Vaccine? Yes   Zostavax completed Yes   Shingrix Completed?: Yes  Screening Tests Health Maintenance  Topic Date Due   Hepatitis C Screening  Never done   COVID-19 Vaccine (6 - 2023-24 season) 06/05/2022   Medicare Annual Wellness (AWV)  11/23/2022   INFLUENZA VACCINE  02/08/2023   DTaP/Tdap/Td (3 - Td or Tdap) 07/28/2030   Pneumonia Vaccine 54+ Years old  Completed   DEXA SCAN  Completed   Zoster Vaccines- Shingrix  Completed   HPV VACCINES  Aged Out   Colonoscopy  Discontinued    Health Maintenance  Health Maintenance Due  Topic Date Due   Hepatitis C Screening  Never done   COVID-19 Vaccine (6 - 2023-24 season) 06/05/2022   Medicare Annual Wellness (AWV)  11/23/2022    Colorectal cancer screening: Type of screening: Colonoscopy. Completed 02/01/22. Repeat every N/a years  Mammogram status: No longer required due to double mastectomy.  Bone Density status: Ordered 05/10/22. Pt provided with contact info and advised to call to schedule appt.  Lung Cancer Screening: (Low Dose CT Chest recommended if Age 58-80 years, 30 pack-year currently smoking OR have quit w/in 15years.) does not qualify.    Additional Screening:  Hepatitis C Screening: does not qualify  Vision Screening: Recommended annual ophthalmology exams for early detection of glaucoma and other disorders of the eye. Is the patient up to date with their annual eye exam?  Yes  Who is the provider or what is the name of the office in which the patient attends annual eye exams? Dr. Janet Berlin If pt is not established with a provider, would they like to be referred to a provider to establish care? No .   Dental Screening: Recommended annual dental exams for proper oral hygiene  Community Resource Referral / Chronic Care  Management: CRR required this visit?  No   CCM required this visit?  No      Plan:     I have personally reviewed and noted the following in the patient's chart:   Medical and social history Use of alcohol, tobacco or illicit drugs  Current medications and supplements including opioid prescriptions. Patient is not currently taking opioid prescriptions. Functional ability and status Nutritional status Physical activity Advanced directives List of other physicians Hospitalizations, surgeries, and ER visits in previous 12  months Vitals Screenings to include cognitive, depression, and falls Referrals and appointments  In addition, I have reviewed and discussed with patient certain preventive protocols, quality metrics, and best practice recommendations. A written personalized care plan for preventive services as well as general preventive health recommendations were provided to patient.   Due to this being a telephonic visit, the after visit summary with patients personalized plan was offered to patient via mail or my-chart. Patient would like to access on my-chart.  Donne Anon, [place]   12/05/2022   Nurse Notes: None

## 2022-12-05 NOTE — Assessment & Plan Note (Signed)
Takes allegra with some improvement in her symptoms.

## 2022-12-05 NOTE — Assessment & Plan Note (Signed)
Reinforced compliance. 

## 2022-12-05 NOTE — Assessment & Plan Note (Signed)
She naps several hours a day and also has not been wearing cpap.  I recommended that she get back on cpap and try to avoid daytime napping.

## 2022-12-05 NOTE — Assessment & Plan Note (Signed)
Stable on prozac 10mg . Conitnue same.

## 2022-12-05 NOTE — Assessment & Plan Note (Signed)
Stable, has increased urination after she takes demadex which is normal.

## 2022-12-05 NOTE — Progress Notes (Signed)
Subjective:   By signing my name below, I, Doylene Bode, attest that this documentation has been prepared under the direction and in the presence of [name], NP 12/05/22   Patient ID: [name], female    DOB: 05-16-1941, 82 y.o.   MRN: [id number]  Chief Complaint  Patient presents with   Hypertension    Here for follow up   Insomnia    Patient reports trouble falling and staying sleep    HPI Patient is in today for a 3 month follow-up.  Lymphedema: treated with torsemide 40mg  daily. Reports swelling is stable. Followed by Dr. Rennis Golden, cardiologist.  Allergies: taking Allegra. This helps her symptoms.  Weight: reports this is stable.  Hypertension: treated with bisoprolol 20 mg daily. Blood pressure normal today at 119/55.  Insomnia: has been ongoing for several months. Usually falls asleep at 3-4am and wakes up at 7am. Naps throughout the day.  Mood: stable with fluoxetine 10 mg daily.  Hyperlipidemia: treated with simvastatin 10 mg daily.  Acid reflux: stable with famotidine 20 mg twice daily.  Sleep apnea: has not been using CPAP due to inconvenience associated with her sleeping arrangement. Sleeps in a recliner.  Denies urinary symptoms.  Past Medical History:  Diagnosis Date   Allergic rhinitis    Ankle fracture    Left, Mortise Widening   Arthritis    "knuckles" (06/17/2012)   Borderline diabetes mellitus 03/06/2011   Breast cancer (HCC)    "right" (06/17/2012)   Cataracts, bilateral    Dyspnea    with exertion   Exertional dyspnea    GERD (gastroesophageal reflux disease)    Hypertension    Iron deficiency anemia    "hematologist watches it" (06/17/2012)   Obesity    OSA treated with BiPAP 03/08/2016   Osteoarthritis    severe right hip osteoarthritis-s/p hip replacement   Osteopenia 11/26/2009   Thyroid disorder    "sees endocrinologist yearly" (06/17/2012)    Past Surgical History:  Procedure Laterality Date   ANKLE FRACTURE  SURGERY Right 01/01/14   Has pins. Procedure performed at [facility]   BREAST BIOPSY  ` 1992   "bilaterlly" (06/17/2012)   COLONOSCOPY W/ POLYPECTOMY     EYE SURGERY  2011   cataract removal both eyes   HIP CLOSED REDUCTION  07/26/2012   Procedure: CLOSED MANIPULATION HIP;  Surgeon: Mable Paris, MD;  Location: [facility];  Service: Orthopedics;  Laterality: Right;   LUMBAR LAMINECTOMY  10/26/2017   LUMBAR LAMINECTOMY/DECOMPRESSION MICRODISCECTOMY Bilateral 10/25/2017   Procedure: LUMBAR 2-3, LUMBAR 3-4 DECOMPRESSION TIME REQUESTED 4.5 HOURS;  Surgeon: Estill Bamberg, MD;  Location: [facility] OR;  Service: Orthopedics;  Laterality: Bilateral;   MASTECTOMY  1993?   bilateral mastectomy   ORIF ANKLE FRACTURE Left 07/30/2017   Procedure: LEFT OPEN REDUCTION INTERNAL FIXATION (ORIF) ANKLE FRACTURE WITH SYDESMOTIC FIXATION;  Surgeon: Jodi Geralds, MD;  Location: [facility] OR;  Service: Orthopedics;  Laterality: Left;   TONSILLECTOMY  1949   TOTAL HIP ARTHROPLASTY  04/2006   right hip    TOTAL HIP REVISION  06/17/2012   "right" (06/17/2012)   TOTAL HIP REVISION  06/17/2012   Procedure: TOTAL HIP REVISION;  Surgeon: [name], MD;  Location: [facility] OR;  Service: Orthopedics;  Laterality: Right;    Family History  Problem Relation Age of Onset   Heart failure Father        deceased age 7   Diabetes Father    Alcohol abuse Father  Breast cancer Mother        deceased at age 73 secondary to breast cancer   Liver disease Sister        Liver failure--deceased   Dementia Maternal Grandmother    Colon cancer Neg Hx    Esophageal cancer Neg Hx    Rectal cancer Neg Hx    Stomach cancer Neg Hx     Social History   Socioeconomic History   Marital status: Widowed    Spouse name: Not on file   Number of children: Not on file   Years of education: Not on file   Highest education level: Bachelor's degree (e.g., BA, AB, BS)  Occupational History   Not on file  Tobacco Use   Smoking status:  Former    Packs/day: 0.50    Years: 10.00    Additional pack years: 0.00    Total pack years: 5.00    Types: Cigarettes    Quit date: 07/14/1974    Years since quitting: 48.4   Smokeless tobacco: Never   Tobacco comments:    quit in 1976 smoked for approx. 10 years  Vaping Use   Vaping Use: Never used  Substance and Sexual Activity   Alcohol use: Yes    Comment: wine on occasion. rare.    Drug use: No   Sexual activity: Never  Other Topics Concern   Not on file  Social History Narrative   Retired   The patient is married and has one child and two grandchildren that live in the area.     She drinks wine with dinner.  She quit smoking in 1976, smoked for   approximately 10 years.       Moved to [place] from [place], [place]         Social Determinants of Health   Financial Resource Strain: Low Risk  (11/28/2022)   Overall Financial Resource Strain (CARDIA)    Difficulty of Paying Living Expenses: Not hard at all  Food Insecurity: No Food Insecurity (11/28/2022)   Hunger Vital Sign    Worried About Running Out of Food in the Last Year: Never true    Ran Out of Food in the Last Year: Never true  Transportation Needs: No Transportation Needs (11/28/2022)   PRAPARE - Administrator, Civil Service (Medical): No    Lack of Transportation (Non-Medical): No  Physical Activity: Unknown (11/28/2022)   Exercise Vital Sign    Days of Exercise per Week: 0 days    Minutes of Exercise per Session: Not on file  Stress: No Stress Concern Present (11/28/2022)   [employer] of Occupational Health - Occupational Stress Questionnaire    Feeling of Stress : Only a little  Social Connections: Socially Isolated (11/28/2022)   Social Connection and Isolation Panel [NHANES]    Frequency of Communication with Friends and Family: More than three times a week    Frequency of Social Gatherings with Friends and Family: Once a week    Attends Religious Services: Never    Doctor, general practice or Organizations: No    Attends Engineer, structural: Not on file    Marital Status: Widowed  Intimate Partner Violence: Not At Risk (11/22/2021)   Humiliation, Afraid, Rape, and Kick questionnaire    Fear of Current or Ex-Partner: No    Emotionally Abused: No    Physically Abused: No    Sexually Abused: No    Outpatient Medications Prior to Visit  Medication Sig  Dispense Refill   acetaminophen (TYLENOL) 500 MG tablet Take 1,000 mg by mouth in the morning, at noon, and at bedtime. Take with Gabapetin     bisoprolol (ZEBETA) 10 MG tablet TAKE 2 TABLETS BY MOUTH DAILY 180 tablet 3   famotidine (PEPCID) 20 MG tablet TAKE 1 TABLET BY MOUTH TWICE  DAILY 180 tablet 3   fexofenadine (ALLEGRA) 180 MG tablet Take 180 mg by mouth daily as needed. For seasonal allergies     FLUoxetine (PROZAC) 10 MG capsule Take 1 capsule (10 mg total) by mouth daily. (Patient taking differently: Take 10 mg by mouth at bedtime.) 90 capsule 1   fluticasone (FLONASE) 50 MCG/ACT nasal spray USE 1 SPRAY IN EACH NOSTRIL EVERY DAY (Patient taking differently: Place 1 spray into both nostrils daily as needed.) 32 g 1   gabapentin (NEURONTIN) 100 MG capsule TAKE 1 CAPSULE BY MOUTH 3 TIMES  DAILY 270 capsule 3   hydrALAZINE (APRESOLINE) 25 MG tablet TAKE 1 TABLET BY MOUTH IN THE  MORNING AND 1 TABLET BY MOUTH AT BEDTIME 180 tablet 1   ketotifen (ZADITOR) 0.025 % ophthalmic solution Place 1 drop into both eyes 2 (two) times daily as needed.     Lidocaine 4 % PTCH Apply 1 patch topically daily as needed (pain).     Omega-3 Fatty Acids (FISH OIL) 600 MG CAPS Take 2 capsules by mouth daily.     Polyethyl Glycol-Propyl Glycol (SYSTANE OP) Apply 1 drop to eye daily as needed (dry eyes).     potassium chloride SA (KLOR-CON M) 20 MEQ tablet Take 1 tablet (20 mEq total) by mouth daily. 90 tablet 1   simvastatin (ZOCOR) 10 MG tablet Take 1 tablet (10 mg total) by mouth daily. 90 tablet 1   torsemide (DEMADEX) 20 MG tablet  Take 40 mg daily. May add an additional 20 mg on the days of swelling, weight gain or SOB. 270 tablet 1   valsartan (DIOVAN) 320 MG tablet Take 1 tablet (320 mg total) by mouth daily. 90 tablet 1   No facility-administered medications prior to visit.    Allergies  Allergen Reactions   Amlodipine Swelling    Swelling    Sulfonamide Derivatives Swelling    REACTION: Swelling of the face; "eyes swelled shut"   Myrbetriq [Mirabegron]     Facial swelling, sob   Chocolate Other (See Comments)    Sinus problems    Review of Systems  Constitutional:  Negative for fever and malaise/fatigue.  HENT:  Negative for congestion.   Eyes:  Negative for blurred vision.  Respiratory:  Negative for cough and shortness of breath.   Cardiovascular:  Negative for chest pain, palpitations and leg swelling.  Gastrointestinal:  Negative for vomiting.  Musculoskeletal:  Negative for back pain.  Skin:  Negative for rash.  Neurological:  Negative for loss of consciousness and headaches.  Psychiatric/Behavioral:  The patient has insomnia.        Objective:    Physical Exam Vitals and nursing note reviewed.  Constitutional:      General: She is not in acute distress.    Appearance: She is well-developed. She is not ill-appearing or toxic-appearing.  Cardiovascular:     Rate and Rhythm: Normal rate and regular rhythm. No extrasystoles are present.    Pulses: Normal pulses.     Heart sounds: Normal heart sounds, S1 normal and S2 normal. No murmur heard.    No friction rub. No gallop.  Pulmonary:     Effort:  Pulmonary effort is normal.     Breath sounds: Normal breath sounds.  Musculoskeletal:     Right lower leg: 2+ Edema present.     Left lower leg: 2+ Edema present.  Skin:    General: Skin is warm and dry.  Neurological:     Mental Status: She is alert.     GCS: GCS eye subscore is 4. GCS verbal subscore is 5. GCS motor subscore is 6.  Psychiatric:        Speech: Speech normal.         Behavior: Behavior normal. Behavior is cooperative.     BP (!) 119/55 (BP Location: Left Arm, Patient Position: Sitting, Cuff Size: Large)   Pulse (!) 101   Temp 97.8 F (36.6 C) (Oral)   Resp 16   Wt 209 lb 9.6 oz (95.1 kg)   LMP 07/11/1991   SpO2 98%   BMI 32.83 kg/m  Wt Readings from Last 3 Encounters:  12/05/22 209 lb 9.6 oz (95.1 kg)  09/25/22 208 lb (94.3 kg)  09/05/22 209 lb (94.8 kg)       Assessment & Plan:  Seasonal allergic rhinitis due to pollen Assessment & Plan: Takes allegra with some improvement in her symptoms.    Insomnia, unspecified type Assessment & Plan: She naps several hours a day and also has not been wearing cpap.  I recommended that she get back on cpap and try to avoid daytime napping.    Anemia, unspecified type Assessment & Plan: Lab Results  Component Value Date   WBC 9.1 09/06/2021   HGB 10.5 (L) 09/06/2021   HCT 31.2 (L) 09/06/2021   MCV 100.0 09/06/2021   PLT 264.0 09/06/2021     Orders: -     CBC with Differential/Platelet -     Iron, TIBC and Ferritin Panel  Borderline diabetes mellitus Assessment & Plan: Lab Results  Component Value Date   HGBA1C 5.9 02/15/2022   HGBA1C 5.9 09/06/2021   HGBA1C 5.7 (H) 05/04/2020   Lab Results  Component Value Date   LDLCALC 70 05/04/2020   CREATININE 1.78 (H) 10/02/2022     Orders: -     Hemoglobin A1c -     Comprehensive metabolic panel  Depression, unspecified depression type Assessment & Plan: Stable on prozac 10mg . Conitnue same.    Gastroesophageal reflux disease, unspecified whether esophagitis present Assessment & Plan: Stable with pepcid. Continue same.    Hyperlipidemia, unspecified hyperlipidemia type Assessment & Plan: Lab Results  Component Value Date   CHOL 167 05/04/2020   HDL 79 05/04/2020   LDLCALC 70 05/04/2020   LDLDIRECT 110.4 12/13/2006   TRIG 94 05/04/2020   CHOLHDL 2.1 05/04/2020   Tolerating simvastatin, continue same, update lipid  panel.    Orders: -     Lipid panel  OSA (obstructive sleep apnea) Assessment & Plan: Reinforced compliance.    Encounter for hepatitis C screening test for low risk patient -     Hepatitis C antibody  Stage 3b chronic kidney disease (HCC) Assessment & Plan: Update cmet.     Overactive bladder Assessment & Plan: Stable, has increased urination after she takes demadex which is normal.    Hyperparathyroidism (HCC) -     Ambulatory referral to Endocrinology     I,Alexander Ruley,acting as a scribe for [name], NP.,have documented all relevant documentation on the behalf of [name], NP,as directed by  [name], NP while in the presence of [name],  NP.

## 2022-12-05 NOTE — Assessment & Plan Note (Signed)
Lab Results  Component Value Date   WBC 9.1 09/06/2021   HGB 10.5 (L) 09/06/2021   HCT 31.2 (L) 09/06/2021   MCV 100.0 09/06/2021   PLT 264.0 09/06/2021

## 2022-12-05 NOTE — Assessment & Plan Note (Signed)
Update cmet.

## 2022-12-05 NOTE — Assessment & Plan Note (Signed)
Stable with pepcid. Continue same.

## 2022-12-05 NOTE — Patient Instructions (Signed)
[name] , Thank you for taking time to come for your Medicare Wellness Visit. I appreciate your ongoing commitment to your health goals. Please review the following plan we discussed and let me know if I can assist you in the future.      This is a list of the screening recommended for you and due dates:  Health Maintenance  Topic Date Due   Hepatitis C Screening  Never done   COVID-19 Vaccine (6 - 2023-24 season) 06/05/2022   Flu Shot  02/08/2023   Medicare Annual Wellness Visit  12/05/2023   DTaP/Tdap/Td vaccine (3 - Td or Tdap) 07/28/2030   Pneumonia Vaccine  Completed   DEXA scan (bone density measurement)  Completed   Zoster (Shingles) Vaccine  Completed   HPV Vaccine  Aged Out   Colon Cancer Screening  Discontinued    Next appointment: Follow up in one year for your annual wellness visit.   Preventive Care 72 Years and Older, Female Preventive care refers to lifestyle choices and visits with your health care provider that can promote health and wellness. What does preventive care include? A yearly physical exam. This is also called an annual well check. Dental exams once or twice a year. Routine eye exams. Ask your health care provider how often you should have your eyes checked. Personal lifestyle choices, including: Daily care of your teeth and gums. Regular physical activity. Eating a healthy diet. Avoiding tobacco and drug use. Limiting alcohol use. Practicing safe sex. Taking low-dose aspirin every day. Taking vitamin and mineral supplements as recommended by your health care provider. What happens during an annual well check? The services and screenings done by your health care provider during your annual well check will depend on your age, overall health, lifestyle risk factors, and family history of disease. Counseling  Your health care provider may ask you questions about your: Alcohol use. Tobacco use. Drug use. Emotional well-being. Home and  relationship well-being. Sexual activity. Eating habits. History of falls. Memory and ability to understand (cognition). Work and work Astronomer. Reproductive health. Screening  You may have the following tests or measurements: Height, weight, and BMI. Blood pressure. Lipid and cholesterol levels. These may be checked every 5 years, or more frequently if you are over 9 years old. Skin check. Lung cancer screening. You may have this screening every year starting at age 97 if you have a 30-pack-year history of smoking and currently smoke or have quit within the past 15 years. Fecal occult blood test (FOBT) of the stool. You may have this test every year starting at age 62. Flexible sigmoidoscopy or colonoscopy. You may have a sigmoidoscopy every 5 years or a colonoscopy every 10 years starting at age 41. Hepatitis C blood test. Hepatitis B blood test. Sexually transmitted disease (STD) testing. Diabetes screening. This is done by checking your blood sugar (glucose) after you have not eaten for a while (fasting). You may have this done every 1-3 years. Bone density scan. This is done to screen for osteoporosis. You may have this done starting at age 33. Mammogram. This may be done every 1-2 years. Talk to your health care provider about how often you should have regular mammograms. Talk with your health care provider about your test results, treatment options, and if necessary, the need for more tests. Vaccines  Your health care provider may recommend certain vaccines, such as: Influenza vaccine. This is recommended every year. Tetanus, diphtheria, and acellular pertussis (Tdap, Td) vaccine. You may need  a Td booster every 10 years. Zoster vaccine. You may need this after age 47. Pneumococcal 13-valent conjugate (PCV13) vaccine. One dose is recommended after age 67. Pneumococcal polysaccharide (PPSV23) vaccine. One dose is recommended after age 48. Talk to your health care provider  about which screenings and vaccines you need and how often you need them. This information is not intended to replace advice given to you by your health care provider. Make sure you discuss any questions you have with your health care provider. Document Released: 07/23/2015 Document Revised: 03/15/2016 Document Reviewed: 04/27/2015 Elsevier Interactive Patient Education  2017 [employer].  Fall Prevention in the Home Falls can cause injuries. They can happen to people of all ages. There are many things you can do to make your home safe and to help prevent falls. What can I do on the outside of my home? Regularly fix the edges of walkways and driveways and fix any cracks. Remove anything that might make you trip as you walk through a door, such as a raised step or threshold. Trim any bushes or trees on the path to your home. Use bright outdoor lighting. Clear any walking paths of anything that might make someone trip, such as rocks or tools. Regularly check to see if handrails are loose or broken. Make sure that both sides of any steps have handrails. Any raised decks and porches should have guardrails on the edges. Have any leaves, snow, or ice cleared regularly. Use sand or salt on walking paths during winter. Clean up any spills in your garage right away. This includes oil or grease spills. What can I do in the bathroom? Use night lights. Install grab bars by the toilet and in the tub and shower. Do not use towel bars as grab bars. Use non-skid mats or decals in the tub or shower. If you need to sit down in the shower, use a plastic, non-slip stool. Keep the floor dry. Clean up any water that spills on the floor as soon as it happens. Remove soap buildup in the tub or shower regularly. Attach bath mats securely with double-sided non-slip rug tape. Do not have throw rugs and other things on the floor that can make you trip. What can I do in the bedroom? Use night lights. Make sure  that you have a light by your bed that is easy to reach. Do not use any sheets or blankets that are too big for your bed. They should not hang down onto the floor. Have a firm chair that has side arms. You can use this for support while you get dressed. Do not have throw rugs and other things on the floor that can make you trip. What can I do in the kitchen? Clean up any spills right away. Avoid walking on wet floors. Keep items that you use a lot in easy-to-reach places. If you need to reach something above you, use a strong step stool that has a grab bar. Keep electrical cords out of the way. Do not use floor polish or wax that makes floors slippery. If you must use wax, use non-skid floor wax. Do not have throw rugs and other things on the floor that can make you trip. What can I do with my stairs? Do not leave any items on the stairs. Make sure that there are handrails on both sides of the stairs and use them. Fix handrails that are broken or loose. Make sure that handrails are as long as the stairways. Check  any carpeting to make sure that it is firmly attached to the stairs. Fix any carpet that is loose or worn. Avoid having throw rugs at the top or bottom of the stairs. If you do have throw rugs, attach them to the floor with carpet tape. Make sure that you have a light switch at the top of the stairs and the bottom of the stairs. If you do not have them, ask someone to add them for you. What else can I do to help prevent falls? Wear shoes that: Do not have high heels. Have rubber bottoms. Are comfortable and fit you well. Are closed at the toe. Do not wear sandals. If you use a stepladder: Make sure that it is fully opened. Do not climb a closed stepladder. Make sure that both sides of the stepladder are locked into place. Ask someone to hold it for you, if possible. Clearly mark and make sure that you can see: Any grab bars or handrails. First and last steps. Where the edge of  each step is. Use tools that help you move around (mobility aids) if they are needed. These include: Canes. Walkers. Scooters. Crutches. Turn on the lights when you go into a dark area. Replace any light bulbs as soon as they burn out. Set up your furniture so you have a clear path. Avoid moving your furniture around. If any of your floors are uneven, fix them. If there are any pets around you, be aware of where they are. Review your medicines with your doctor. Some medicines can make you feel dizzy. This can increase your chance of falling. Ask your doctor what other things that you can do to help prevent falls. This information is not intended to replace advice given to you by your health care provider. Make sure you discuss any questions you have with your health care provider. Document Released: 04/22/2009 Document Revised: 12/02/2015 Document Reviewed: 07/31/2014 Elsevier Interactive Patient Education  2017 [employer].

## 2022-12-06 ENCOUNTER — Telehealth: Payer: Self-pay | Admitting: Family

## 2022-12-06 DIAGNOSIS — N184 Chronic kidney disease, stage 4 (severe): Secondary | ICD-10-CM

## 2022-12-06 LAB — IRON,TIBC AND FERRITIN PANEL
%SAT: 37 % (calc) (ref 16–45)
Ferritin: 87 ng/mL (ref 16–288)
Iron: 108 ug/dL (ref 45–160)
TIBC: 292 mcg/dL (calc) (ref 250–450)

## 2022-12-06 LAB — HEPATITIS C ANTIBODY: Hepatitis C Ab: NONREACTIVE

## 2022-12-06 NOTE — Telephone Encounter (Signed)
Please advise pt that her kidney function has decreased some over the last year.  I know we have made several referrals to nephrology but I don't see any notes from them.  If she is seeing them please call to schedule follow up.  If not, I am placing another referral.  It is important that she get established with them.   Sugar, iron levels and cholesterol all look good.

## 2022-12-07 NOTE — Telephone Encounter (Signed)
Called patient but no answer, left voice mail for patient to call back.   

## 2022-12-07 NOTE — Telephone Encounter (Signed)
Pt notified of results.  She has not been to nephrology yet.  Advised the importance of this and that she should be seen.  Number given for her to call and schedule.

## 2022-12-15 ENCOUNTER — Other Ambulatory Visit: Payer: Self-pay | Admitting: Family Medicine

## 2022-12-29 ENCOUNTER — Ambulatory Visit: Payer: Medicare Other | Attending: Nurse Practitioner | Admitting: Nurse Practitioner

## 2022-12-29 ENCOUNTER — Encounter: Payer: Self-pay | Admitting: Nurse Practitioner

## 2022-12-29 VITALS — BP 119/60 | HR 56 | Ht 67.0 in | Wt 205.4 lb

## 2022-12-29 DIAGNOSIS — N1832 Chronic kidney disease, stage 3b: Secondary | ICD-10-CM | POA: Diagnosis not present

## 2022-12-29 DIAGNOSIS — I5032 Chronic diastolic (congestive) heart failure: Secondary | ICD-10-CM | POA: Diagnosis not present

## 2022-12-29 DIAGNOSIS — R002 Palpitations: Secondary | ICD-10-CM

## 2022-12-29 DIAGNOSIS — I10 Essential (primary) hypertension: Secondary | ICD-10-CM | POA: Diagnosis not present

## 2022-12-29 DIAGNOSIS — G4733 Obstructive sleep apnea (adult) (pediatric): Secondary | ICD-10-CM

## 2022-12-29 NOTE — Patient Instructions (Signed)
Medication Instructions:  Your physician recommends that you continue on your current medications as directed. Please refer to the Current Medication list given to you today.  *If you need a refill on your cardiac medications before your next appointment, please call your pharmacy*   Lab Work: NONE ordered at this time of appointment   Testing/Procedures: NONE ordered at this time of appointment     Follow-Up: At [facility], you and your health needs are our priority.  As part of our continuing mission to provide you with exceptional heart care, we have created designated Provider Care Teams.  These Care Teams include your primary Cardiologist (physician) and Advanced Practice Providers (APPs -  Physician Assistants and Nurse Practitioners) who all work together to provide you with the care you need, when you need it.  We recommend signing up for the patient portal called "MyChart".  Sign up information is provided on this After Visit Summary.  MyChart is used to connect with patients for Virtual Visits (Telemedicine).  Patients are able to view lab/test results, encounter notes, upcoming appointments, etc.  Non-urgent messages can be sent to your provider as well.   To learn more about what you can do with MyChart, go to [email or web address].    Your next appointment:   6 month(s)  Provider:   Kenneth C Hilty, MD     Other Instructions   

## 2022-12-29 NOTE — Progress Notes (Signed)
Office Visit    Patient Name: [name] Date of Encounter: 12/29/2022  Primary Care Provider:  Sandford Craze, NP Primary Cardiologist:  Chrystie Nose, MD  Chief Complaint    82 year old female with a history of chronic diastolic heart failure, palpitations, hypertension, OSA, obesity, and GERD who presents for follow-up related to heart failure and hypertension.    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 Procedure performed at [facility]   BREAST BIOPSY  ` 1992   "bilaterlly" (06/17/2012)   COLONOSCOPY W/ POLYPECTOMY     EYE SURGERY  2011   cataract removal both eyes   FRACTURE SURGERY     HIP CLOSED REDUCTION  07/26/2012   Procedure: CLOSED MANIPULATION HIP;  Surgeon: Mable Paris, MD;  Location: [facility];  Service: Orthopedics;  Laterality: Right;   JOINT REPLACEMENT  2007   LUMBAR LAMINECTOMY  10/26/2017   LUMBAR LAMINECTOMY/DECOMPRESSION MICRODISCECTOMY Bilateral 10/25/2017   Procedure: LUMBAR 2-3, LUMBAR 3-4 DECOMPRESSION TIME REQUESTED 4.5 HOURS;  Surgeon: Estill Bamberg, MD;  Location: [facility] OR;  Service: Orthopedics;  Laterality: Bilateral;   MASTECTOMY  1993?    bilateral mastectomy   ORIF ANKLE FRACTURE Left 07/30/2017   Procedure: LEFT OPEN REDUCTION INTERNAL FIXATION (ORIF) ANKLE FRACTURE WITH SYDESMOTIC FIXATION;  Surgeon: Jodi Geralds, MD;  Location: [facility] OR;  Service: Orthopedics;  Laterality: Left;   TONSILLECTOMY  1949   TOTAL HIP ARTHROPLASTY  04/2006   right hip    TOTAL HIP REVISION  06/17/2012   "right" (06/17/2012)   TOTAL HIP REVISION  06/17/2012   Procedure: TOTAL HIP REVISION;  Surgeon: [name], MD;  Location: [facility] OR;  Service: Orthopedics;  Laterality: Right;    Allergies  Allergies  Allergen Reactions   Amlodipine Swelling    Swelling    Sulfonamide Derivatives Swelling    REACTION: Swelling of the face; "eyes swelled shut"   Myrbetriq [Mirabegron]     Facial swelling, sob   Chocolate Other (See Comments)    Sinus problems     Labs/Other Studies Reviewed    The following studies were reviewed today:  Cardiac Studies & Procedures     STRESS TESTS  MYOCARDIAL PERFUSION IMAGING 12/01/2015  Narrative  The left ventricular ejection fraction is normal (55-65%).  Nuclear stress EF: 64%. No wall motion abnormalities  There was no ST segment deviation noted during stress. Bowel loop artifact noted during stress.  This is a low risk study. No ischemia identified.  Donato Schultz, MD   ECHOCARDIOGRAM  ECHOCARDIOGRAM COMPLETE 09/06/2020  Narrative ECHOCARDIOGRAM REPORT    Patient Name:  [name] Date of Exam: 09/06/2020 Medical Rec #:  [id number]         Height:       66.0 in Accession #:    [id number]        Weight:       246.0 lb Date of Birth:  06-21-1941          BSA:          2.184 m Patient Age:    79 years          BP:           154/73 mmHg Patient Gender: F                 HR:           71 bpm. Exam Location:  [place]  Procedure: 2D Echo, Cardiac Doppler and Color Doppler  Indications:    R06.9 DOE  History:        Patient has prior history of Echocardiogram examinations,  most recent 07/15/2014. CHF, Signs/Symptoms:Shortness of Breath and Dyspnea; Risk Factors:Sleep Apnea.  Sonographer:    Joaquim Nam Referring Phys: [id number] [name] O'SULLIVAN   Sonographer Comments: Image acquisition challenging due to patient body habitus and Severe SOB. Patient could not lie down. Did her exam sitting. IMPRESSIONS   1. Left ventricular ejection fraction, by estimation, is 60 to 65%. The left ventricle has normal function. The left ventricle has no regional wall motion abnormalities. Left ventricular diastolic parameters are consistent with Grade II diastolic dysfunction (pseudonormalization). Elevated left atrial pressure. 2. Right ventricular systolic function is normal. The right ventricular size is normal. There is moderately elevated pulmonary artery systolic pressure. The estimated right ventricular systolic pressure is 59.9 mmHg. 3. Left atrial size was mildly dilated. 4. The mitral valve is normal in structure. Trivial mitral valve regurgitation. No evidence of mitral stenosis. 5. Tricuspid valve regurgitation is moderate. 6. The aortic valve is tricuspid. Aortic valve regurgitation is not visualized. No aortic stenosis is present. 7. The inferior vena cava is normal in size with greater than 50% respiratory variability, suggesting right atrial pressure of 3 mmHg.  FINDINGS Left Ventricle: Left ventricular ejection fraction, by estimation, is 60 to 65%. The left ventricle has normal function. The left ventricle has no regional wall motion abnormalities. The left ventricular internal cavity size was normal in size. There is no left ventricular hypertrophy. Abnormal (paradoxical) septal motion, consistent with left bundle branch block. Left ventricular diastolic parameters are consistent with Grade II diastolic dysfunction (pseudonormalization). Elevated left atrial pressure.  Right Ventricle: The right ventricular size is normal. No increase in right ventricular wall  thickness. Right ventricular systolic function is normal. There is moderately elevated pulmonary artery systolic pressure. The tricuspid regurgitant velocity is 3.77 m/s, and with an assumed right atrial pressure of 3 mmHg, the estimated right ventricular systolic pressure is 59.9 mmHg.  Left Atrium: Left atrial size was mildly dilated.  Right Atrium: Right atrial size was normal in size.  Pericardium: There is no evidence of pericardial effusion.  Mitral Valve: The mitral valve is normal in structure. Trivial mitral valve regurgitation. No evidence of mitral valve stenosis.  Tricuspid Valve: The tricuspid valve is normal in structure. Tricuspid valve regurgitation is moderate . No evidence of tricuspid stenosis.  Aortic Valve: The aortic valve is tricuspid. Aortic valve regurgitation is not visualized. No aortic stenosis is present.  Pulmonic Valve: The pulmonic valve was not well visualized. Pulmonic valve regurgitation is not visualized. No  evidence of pulmonic stenosis.  Aorta: The aortic root and ascending aorta are structurally normal, with no evidence of dilitation and the aortic arch was not well visualized.  Venous: The pulmonary veins were not well visualized. The inferior vena cava is normal in size with greater than 50% respiratory variability, suggesting right atrial pressure of 3 mmHg.  IAS/Shunts: No atrial level shunt detected by color flow Doppler.   LEFT VENTRICLE PLAX 2D LVIDd:         5.29 cm  Diastology LVIDs:         3.65 cm  LV e' medial:    8.59 cm/s LV PW:         0.81 cm  LV E/e' medial:  11.3 LV IVS:        0.88 cm  LV e' lateral:   9.68 cm/s LVOT diam:     2.00 cm  LV E/e' lateral: 10.0 LV SV:         57 LV SV Index:   26 LVOT Area:     3.14 cm   LEFT ATRIUM           Index       RIGHT ATRIUM           Index LA diam:      4.60 cm 2.11 cm/m  RA Area:     17.40 cm LA Vol (A4C): 72.4 ml 33.15 ml/m RA Volume:   45.50 ml  20.84 ml/m AORTIC  VALVE LVOT Vmax:   74.40 cm/s LVOT Vmean:  49.900 cm/s LVOT VTI:    0.182 m  AORTA Ao Root diam: 2.80 cm Ao Asc diam:  2.80 cm  MITRAL VALVE               TRICUSPID VALVE MV Area (PHT): 4.96 cm    TR Peak grad:   56.9 mmHg MV Decel Time: 153 msec    TR Vmax:        377.00 cm/s MV E velocity: 97.05 cm/s MV A velocity: 54.80 cm/s  SHUNTS MV E/A ratio:  1.77        Systemic VTI:  0.18 m Systemic Diam: 2.00 cm  Norman Herrlich MD Electronically signed by Norman Herrlich MD Signature Date/Time: 09/06/2020/5:35:42 PM    Final            Recent Labs: 08/15/2022: Pro B Natriuretic peptide (BNP) 760.0 09/01/2022: BNP 292.8 12/05/2022: ALT 24; BUN 49; Creatinine, Ser 1.79; Hemoglobin 10.1; Platelets 248.0; Potassium 4.7; Sodium 135  Recent Lipid Panel    Component Value Date/Time   CHOL 137 12/05/2022 1112   TRIG 95.0 12/05/2022 1112   HDL 67.00 12/05/2022 1112   CHOLHDL 2 12/05/2022 1112   VLDL 19.0 12/05/2022 1112   LDLCALC 51 12/05/2022 1112   LDLCALC 70 05/04/2020 1402   LDLDIRECT 110.4 12/13/2006 0900    History of Present Illness    81 year old female with the above past medical history including chronic diastolic heart failure, palpitations, hypertension, OSA, obesity, and GERD.   She was hospitalized in January 2016 in the setting of acute heart failure.  Echocardiogram showed normal systolic and diastolic function.  She improved with diuresis. Cardiac monitor in the setting of palpitations showed PACs, PVCs, and a transient episode of SVT.  Myoview in 2017 showed EF 64%, no ischemia, overall low risk.  At her follow-up visit in 08/2022 she noted increased bilateral lower extremity edema in the setting of having stopped taking her Lasix.  She had also  been recently treated for left lower extremity cellulitis.  Lower extremity duplex was negative for DVT.  She was transitioned from Lasix to torsemide.  Follow-up was recommended with PCP for possible component of lymphedema.  She was  last seen in the office on 09/25/2022 and was stable overall from a cardiac standpoint.  Her lower extremity edema improved significantly with transition from Lasix to torsemide.  She did note some ongoing lower extremity edema, torsemide was increased to 60 mg daily.  However, due to worsening renal function, torsemide was later reduced to 40 mg daily, with an additional torsemide 20 mg daily as needed for swelling, weight gain, shortness of breath.  She was referred to nephrology per her PCP.   She presents today for follow-up accompanied by her son.  Since her last visit she has done well from a cardiac standpoint.  She is taking torsemide 40 mg daily.  She has stable nonpitting bilateral lower extremity edema.  She is rarely taking an additional 20 mg daily.  She denies any chest pain, dyspnea, palpitations, PND, orthopnea, weight gain.  Overall, she reports feeling well.  Home Medications    Current Outpatient Medications  Medication Sig Dispense Refill   acetaminophen (TYLENOL) 500 MG tablet Take 1,000 mg by mouth in the morning, at noon, and at bedtime. Take with Gabapetin     bisoprolol (ZEBETA) 10 MG tablet TAKE 2 TABLETS BY MOUTH DAILY 180 tablet 3   famotidine (PEPCID) 20 MG tablet TAKE 1 TABLET BY MOUTH TWICE  DAILY 180 tablet 3   fexofenadine (ALLEGRA) 180 MG tablet Take 180 mg by mouth daily as needed. For seasonal allergies     FLUoxetine (PROZAC) 10 MG capsule Take 1 capsule (10 mg total) by mouth at bedtime. 90 capsule 2   fluticasone (FLONASE) 50 MCG/ACT nasal spray USE 1 SPRAY IN EACH NOSTRIL EVERY DAY (Patient taking differently: Place 1 spray into both nostrils daily as needed.) 32 g 1   gabapentin (NEURONTIN) 100 MG capsule TAKE 1 CAPSULE BY MOUTH 3 TIMES  DAILY 270 capsule 3   hydrALAZINE (APRESOLINE) 25 MG tablet TAKE 1 TABLET BY MOUTH IN THE  MORNING AND 1 TABLET BY MOUTH AT BEDTIME 180 tablet 1   ketotifen (ZADITOR) 0.025 % ophthalmic solution Place 1 drop into both eyes 2  (two) times daily as needed.     Lidocaine 4 % PTCH Apply 1 patch topically daily as needed (pain).     Omega-3 Fatty Acids (FISH OIL) 600 MG CAPS Take 2 capsules by mouth daily.     Polyethyl Glycol-Propyl Glycol (SYSTANE OP) Apply 1 drop to eye daily as needed (dry eyes).     potassium chloride SA (KLOR-CON M) 20 MEQ tablet Take 1 tablet (20 mEq total) by mouth daily. 90 tablet 1   simvastatin (ZOCOR) 10 MG tablet Take 1 tablet (10 mg total) by mouth daily. 90 tablet 1   torsemide (DEMADEX) 20 MG tablet Take 40 mg daily. May add an additional 20 mg on the days of swelling, weight gain or SOB. 270 tablet 1   valsartan (DIOVAN) 320 MG tablet Take 1 tablet (320 mg total) by mouth daily. 90 tablet 1   No current facility-administered medications for this visit.     Review of Systems    She denies chest pain, palpitations, dyspnea, pnd, orthopnea, n, v, dizziness, syncope, weight gain, or early satiety. All other systems reviewed and are otherwise negative except as noted above.   Physical Exam  VS:  BP 119/60 (BP Location: Right Arm, Patient Position: Sitting, Cuff Size: Large)   Pulse (!) 56   Ht 5\' 7"  (1.702 m)   Wt 205 lb 6.4 oz (93.2 kg)   LMP 07/11/1991   SpO2 98%   BMI 32.17 kg/m   GEN: Well nourished, well developed, in no acute distress. HEENT: normal. Neck: Supple, no JVD, carotid bruits, or masses. Cardiac: RRR, no murmurs, rubs, or gallops. No clubbing, cyanosis, nonpitting bilateral lower extremity edema.  Radials/DP/PT 2+ and equal bilaterally.  Respiratory:  Respirations regular and unlabored, clear to auscultation bilaterally. GI: Soft, nontender, nondistended, BS + x 4. MS: no deformity or atrophy. Skin: warm and dry, no rash. Neuro:  Strength and sensation are intact. Psych: Normal affect.  Accessory Clinical Findings    ECG personally reviewed by me today -    No EKG in office today.    Lab Results  Component Value Date   WBC 8.4 12/05/2022   HGB 10.1 (L)  12/05/2022   HCT 30.6 (L) 12/05/2022   MCV 99.7 12/05/2022   PLT 248.0 12/05/2022   Lab Results  Component Value Date   CREATININE 1.79 (H) 12/05/2022   BUN 49 (H) 12/05/2022   NA 135 12/05/2022   K 4.7 12/05/2022   CL 104 12/05/2022   CO2 21 12/05/2022   Lab Results  Component Value Date   ALT 24 12/05/2022   AST 29 12/05/2022   ALKPHOS 78 12/05/2022   BILITOT 0.5 12/05/2022   Lab Results  Component Value Date   CHOL 137 12/05/2022   HDL 67.00 12/05/2022   LDLCALC 51 12/05/2022   LDLDIRECT 110.4 12/13/2006   TRIG 95.0 12/05/2022   CHOLHDL 2 12/05/2022    Lab Results  Component Value Date   HGBA1C 5.8 12/05/2022    Assessment & Plan    1. Chronic Diastolic heart failure: Echo in 08/2020 showed EF 60 to 65%, normal LV function, no RWMA, G2 DD, normal RV systolic function, moderate elevated PASP, no significant valvular abnormalities. Recent increased bilateral lower extremity edema, significantly improved  with transition from Lasix to torsemide. Continue monitoring with daily weights, sodium and fluid recommendations.  Continue valsartan, bisoprolol, hydralazine, torsemide 40 mg daily.  She may take an additional torsemide 20 mg daily as needed for swelling, weight gain.   2. Palpitations: Monitor in 2017 showed PACs, PVCs and transient SVT.  Denies any recent palpitations.  Stable.  Continue bisoprolol.   3. Hypertension: BP well controlled. Continue current antihypertensive regimen.    4. CKD stage III-IV: She has had a recent decrease in kidney function.  Creatinine was 1.79 on 12/05/2022.  Nephrology referral pending per PCP.  5. OSA: Encouraged adherence to CPAP.   6. Obesity: Continue to encourage increased activity as tolerated, she is fairly sedentary.   7. Disposition: Follow-up in 6 months.      Joylene Grapes, NP 12/29/2022, 10:56 AM

## 2023-01-26 ENCOUNTER — Encounter: Payer: Self-pay | Admitting: Family

## 2023-02-03 ENCOUNTER — Other Ambulatory Visit: Payer: Self-pay | Admitting: Family

## 2023-02-16 ENCOUNTER — Encounter: Payer: Self-pay | Admitting: Family

## 2023-02-16 LAB — CBC AND DIFFERENTIAL
Albumin, Urine POC: 4.4
Albumin/Creatinine Ratio, Urine, POC: 5
Creatinine, POC: 82 mg/dL
EGFR: 25
HCT: 28 — AB (ref 36–46)
Hemoglobin: 9.6 — AB (ref 12.0–16.0)
Neutrophils Absolute: 53
WBC: 8.5

## 2023-02-16 LAB — HEPATIC FUNCTION PANEL
ALT: 16 U/L (ref 7–35)
AST: 22 (ref 13–35)
Alkaline Phosphatase: 75 (ref 25–125)
Bilirubin, Total: 0.4

## 2023-02-16 LAB — IRON,TIBC AND FERRITIN PANEL
%SAT: 41
Ferritin: 183
Iron: 259
UIBC: 153

## 2023-02-16 LAB — BASIC METABOLIC PANEL
BUN: 51 — AB (ref 4–21)
Chloride: 106 (ref 99–108)
Creatinine: 2 — AB (ref 0.5–1.1)
Glucose: 110
Potassium: 4.7 mEq/L (ref 3.5–5.1)
Sodium: 138 (ref 137–147)

## 2023-02-16 LAB — CBC: RBC: 2.89 — AB (ref 3.87–5.11)

## 2023-02-16 LAB — COMPREHENSIVE METABOLIC PANEL: eGFR: 26

## 2023-02-16 NOTE — Telephone Encounter (Signed)
Patient notified she did had a chest xray in 2016 that showed she had some fluid in the chest. She said this Is the date the provider was taking to her about.

## 2023-02-22 ENCOUNTER — Encounter (INDEPENDENT_AMBULATORY_CARE_PROVIDER_SITE_OTHER): Payer: Self-pay

## 2023-04-03 ENCOUNTER — Other Ambulatory Visit: Payer: Self-pay | Admitting: Family

## 2023-04-05 ENCOUNTER — Encounter: Payer: Self-pay | Admitting: Family

## 2023-04-09 ENCOUNTER — Ambulatory Visit: Payer: Medicare Other | Admitting: Family

## 2023-04-09 ENCOUNTER — Encounter: Payer: Self-pay | Admitting: Family

## 2023-04-09 ENCOUNTER — Telehealth: Payer: Self-pay | Admitting: Family

## 2023-04-09 VITALS — BP 120/50 | HR 65 | Temp 98.3°F | Resp 16 | Wt 216.0 lb

## 2023-04-09 DIAGNOSIS — J302 Other seasonal allergic rhinitis: Secondary | ICD-10-CM

## 2023-04-09 DIAGNOSIS — F32A Depression, unspecified: Secondary | ICD-10-CM | POA: Diagnosis not present

## 2023-04-09 DIAGNOSIS — E785 Hyperlipidemia, unspecified: Secondary | ICD-10-CM

## 2023-04-09 DIAGNOSIS — D649 Anemia, unspecified: Secondary | ICD-10-CM | POA: Diagnosis not present

## 2023-04-09 DIAGNOSIS — N184 Chronic kidney disease, stage 4 (severe): Secondary | ICD-10-CM | POA: Diagnosis not present

## 2023-04-09 DIAGNOSIS — Z23 Encounter for immunization: Secondary | ICD-10-CM | POA: Diagnosis not present

## 2023-04-09 DIAGNOSIS — I5032 Chronic diastolic (congestive) heart failure: Secondary | ICD-10-CM

## 2023-04-09 DIAGNOSIS — G4733 Obstructive sleep apnea (adult) (pediatric): Secondary | ICD-10-CM

## 2023-04-09 DIAGNOSIS — I10 Essential (primary) hypertension: Secondary | ICD-10-CM

## 2023-04-09 LAB — CBC WITH DIFFERENTIAL/PLATELET
Basophils Absolute: 0.1 10*3/uL (ref 0.0–0.1)
Basophils Relative: 0.8 % (ref 0.0–3.0)
Eosinophils Absolute: 0.1 10*3/uL (ref 0.0–0.7)
Eosinophils Relative: 1.2 % (ref 0.0–5.0)
HCT: 30.1 % — ABNORMAL LOW (ref 36.0–46.0)
Hemoglobin: 9.9 g/dL — ABNORMAL LOW (ref 12.0–15.0)
Lymphocytes Relative: 31 % (ref 12.0–46.0)
Lymphs Abs: 2.5 10*3/uL (ref 0.7–4.0)
MCHC: 32.9 g/dL (ref 30.0–36.0)
MCV: 103.4 fL — ABNORMAL HIGH (ref 78.0–100.0)
Monocytes Absolute: 0.7 10*3/uL (ref 0.1–1.0)
Monocytes Relative: 8.4 % (ref 3.0–12.0)
Neutro Abs: 4.7 10*3/uL (ref 1.4–7.7)
Neutrophils Relative %: 58.6 % (ref 43.0–77.0)
Platelets: 221 10*3/uL (ref 150.0–400.0)
RBC: 2.91 Mil/uL — ABNORMAL LOW (ref 3.87–5.11)
RDW: 12.5 % (ref 11.5–15.5)
WBC: 8.1 10*3/uL (ref 4.0–10.5)

## 2023-04-09 LAB — COMPREHENSIVE METABOLIC PANEL
ALT: 15 U/L (ref 0–35)
AST: 22 U/L (ref 0–37)
Albumin: 4.2 g/dL (ref 3.5–5.2)
Alkaline Phosphatase: 64 U/L (ref 39–117)
BUN: 48 mg/dL — ABNORMAL HIGH (ref 6–23)
CO2: 23 meq/L (ref 19–32)
Calcium: 10.6 mg/dL — ABNORMAL HIGH (ref 8.4–10.5)
Chloride: 105 meq/L (ref 96–112)
Creatinine, Ser: 1.96 mg/dL — ABNORMAL HIGH (ref 0.40–1.20)
GFR: 23.8 mL/min — ABNORMAL LOW (ref >60.00)
Glucose, Bld: 120 mg/dL — ABNORMAL HIGH (ref 70–99)
Potassium: 5 meq/L (ref 3.5–5.1)
Sodium: 137 meq/L (ref 135–145)
Total Bilirubin: 0.5 mg/dL (ref 0.2–1.2)
Total Protein: 6.9 g/dL (ref 6.0–8.3)

## 2023-04-09 NOTE — Telephone Encounter (Signed)
Please call [facility] and request most recent labs/consult.

## 2023-04-09 NOTE — Assessment & Plan Note (Signed)
Feeling better back on CPAP, continue same.

## 2023-04-09 NOTE — Assessment & Plan Note (Signed)
  Recent visit with nephrologist, mild decrease in kidney function noted. -Request nephrologist's notes and labs. -Check kidney function and blood count today.

## 2023-04-09 NOTE — Assessment & Plan Note (Signed)
Appears clinically euvolemic.

## 2023-04-09 NOTE — Progress Notes (Signed)
Subjective:     Patient ID: [name], female    DOB: January 15, 1941, 82 y.o.   MRN: [id number]  Chief Complaint  Patient presents with   Hyperlipidemia    Here for follow up   Congestive Heart Failure    Hyperlipidemia  Congestive Heart Failure    Discussed the use of AI scribe software for clinical note transcription with the patient, who gave verbal consent to proceed.  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 She is accompanied today by her son. She reports that her seasonal allergies have been acting up again and she has been managing it with Allegra and a nasal spray, which she finds helpful.  The patient had been having trouble sleeping at night and has been taking naps during the day at last visit. However, she reports that her sleep quality has improved significantly since she restarted using her CPAP machine regularly. She now requires fewer naps during the day.  The patient's mood fluctuates. She notes that the prozac causes her to feel drowsy so she stopped it for a bit, but noted that her mood worsened. She feels that she does really need the prozac.   The patient has been working with Nephrology at [facility] for her CKD.  The kidney doctor has advised her to keep an eye on it and return for a follow-up in a year.      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   Breast cancer (HCC)    "right" (06/17/2012)   Cataracts, bilateral    Depression  2020   Displaced fracture of distal end of left fibula 07/30/2017   Dyspnea    with exertion   Exertional dyspnea    GERD (gastroesophageal reflux disease)    Hypertension    Iron deficiency anemia    "hematologist watches it" (06/17/2012)   Obesity    OSA treated with BiPAP 03/08/2016   Osteoarthritis    severe right hip osteoarthritis-s/p hip replacement   Osteopenia 11/26/2009   Thyroid disorder    "sees endocrinologist yearly" (06/17/2012)    Past Surgical History:  Procedure Laterality Date   ANKLE FRACTURE SURGERY Right 01/01/2014   Has pins. Procedure performed at [facility]   BREAST BIOPSY  ` 1992   "bilaterlly" (06/17/2012)   COLONOSCOPY W/ POLYPECTOMY     EYE SURGERY  2011   cataract removal both eyes   FRACTURE SURGERY     HIP CLOSED REDUCTION  07/26/2012   Procedure: CLOSED MANIPULATION HIP;  Surgeon: Mable Paris, MD;  Location: [facility];  Service: Orthopedics;  Laterality: Right;   JOINT REPLACEMENT  2007   LUMBAR LAMINECTOMY  10/26/2017   LUMBAR LAMINECTOMY/DECOMPRESSION MICRODISCECTOMY Bilateral 10/25/2017   Procedure: LUMBAR 2-3, LUMBAR 3-4 DECOMPRESSION TIME REQUESTED 4.5 HOURS;  Surgeon: Estill Bamberg, MD;  Location: [facility] OR;  Service: Orthopedics;  Laterality: Bilateral;   MASTECTOMY  1993?   bilateral mastectomy  ORIF ANKLE FRACTURE Left 07/30/2017   Procedure: LEFT OPEN REDUCTION INTERNAL FIXATION (ORIF) ANKLE FRACTURE WITH SYDESMOTIC FIXATION;  Surgeon: Jodi Geralds, MD;  Location: [facility] OR;  Service: Orthopedics;  Laterality: Left;   TONSILLECTOMY  1949   TOTAL HIP ARTHROPLASTY  04/2006   right hip    TOTAL HIP REVISION  06/17/2012   "right" (06/17/2012)   TOTAL HIP REVISION  06/17/2012   Procedure: TOTAL HIP REVISION;  Surgeon: [name], MD;  Location: [facility] OR;  Service: Orthopedics;  Laterality: Right;    Family History  Problem Relation Age of Onset   Heart failure Father        deceased age 52   Diabetes Father    Alcohol  abuse Father    Breast cancer Mother        deceased at age 18 secondary to breast cancer   Liver disease Sister        Liver failure--deceased   Dementia Maternal Grandmother    Colon cancer Neg Hx    Esophageal cancer Neg Hx    Rectal cancer Neg Hx    Stomach cancer Neg Hx     Social History   Socioeconomic History   Marital status: Widowed    Spouse name: Not on file   Number of children: Not on file   Years of education: Not on file   Highest education level: Bachelor's degree (e.g., BA, AB, BS)  Occupational History   Not on file  Tobacco Use   Smoking status: Former    Current packs/day: 0.00    Average packs/day: 0.5 packs/day for 10.0 years (5.0 ttl pk-yrs)    Types: Cigarettes    Start date: 07/14/1964    Quit date: 07/14/1974    Years since quitting: 48.7   Smokeless tobacco: Never   Tobacco comments:    quit in 1976 smoked for approx. 10 years  Vaping Use   Vaping status: Never Used  Substance and Sexual Activity   Alcohol use: Yes    Alcohol/week: 4.0 standard drinks of alcohol    Types: 4 Glasses of wine per week    Comment: Glass of wine once in awhile.   Drug use: No   Sexual activity: Never  Other Topics Concern   Not on file  Social History Narrative   Retired   The patient is married and has one child and two grandchildren that live in the area.     She drinks wine with dinner.  She quit smoking in 1976, smoked for   approximately 10 years.       Moved to [place] from [place], [place]         Social Determinants of Health   Financial Resource Strain: Low Risk  (11/28/2022)   Overall Financial Resource Strain (CARDIA)    Difficulty of Paying Living Expenses: Not hard at all  Food Insecurity: No Food Insecurity (11/28/2022)   Hunger Vital Sign    Worried About Running Out of Food in the Last Year: Never true    Ran Out of Food in the Last Year: Never true  Transportation Needs: No Transportation Needs (11/28/2022)   PRAPARE - Therapist, art (Medical): No    Lack of Transportation (Non-Medical): No  Physical Activity: Inactive (11/28/2022)   Exercise Vital Sign    Days of Exercise per Week: 0 days    Minutes of Exercise per Session: 0 min  Stress: No Stress Concern Present (11/28/2022)   [place]  [employer]stitute of Occupational Health - Occupational Stress Questionnaire    Feeling of Stress : Only a little  Social Connections: Socially Isolated (11/28/2022)   Social Connection and Isolation Panel [NHANES]    Frequency of Communication with Friends and Family: Once a week    Frequency of Social Gatherings with Friends and Family: Once a week    Attends Religious Services: Never    Database administrator or Organizations: No    Attends Banker Meetings: Never    Marital Status: Widowed  Intimate Partner Violence: Not At Risk (11/22/2021)   Humiliation, Afraid, Rape, and Kick questionnaire    Fear of Current or Ex-Partner: No    Emotionally Abused: No    Physically Abused: No    Sexually Abused: No    Outpatient Medications Prior to Visit  Medication Sig Dispense Refill   acetaminophen (TYLENOL) 500 MG tablet Take 1,000 mg by mouth in the morning, at noon, and at bedtime. Take with Gabapetin     bisoprolol (ZEBETA) 10 MG tablet TAKE 2 TABLETS BY MOUTH DAILY 180 tablet 3   famotidine (PEPCID) 20 MG tablet TAKE 1 TABLET BY MOUTH TWICE  DAILY 180 tablet 3   fexofenadine (ALLEGRA) 180 MG tablet Take 180 mg by mouth daily as needed. For seasonal allergies     FLUoxetine (PROZAC) 10 MG capsule Take 1 capsule (10 mg total) by mouth at bedtime. 90 capsule 2   fluticasone (FLONASE) 50 MCG/ACT nasal spray USE 1 SPRAY IN EACH NOSTRIL EVERY DAY (Patient taking differently: Place 1 spray into both nostrils daily as needed.) 32 g 1   gabapentin (NEURONTIN) 100 MG capsule TAKE 1 CAPSULE BY MOUTH 3 TIMES  DAILY 270 capsule 3   hydrALAZINE (APRESOLINE) 25 MG tablet TAKE 1 TABLET BY MOUTH IN THE  MORNING AND 1  TABLET BY MOUTH AT BEDTIME 180 tablet 1   ketotifen (ZADITOR) 0.025 % ophthalmic solution Place 1 drop into both eyes 2 (two) times daily as needed.     KLOR-CON M20 20 MEQ tablet TAKE 1 TABLET BY MOUTH EVERY DAY 90 tablet 1   Lidocaine 4 % PTCH Apply 1 patch topically daily as needed (pain).     Omega-3 Fatty Acids (FISH OIL) 600 MG CAPS Take 2 capsules by mouth daily.     Polyethyl Glycol-Propyl Glycol (SYSTANE OP) Apply 1 drop to eye daily as needed (dry eyes).     simvastatin (ZOCOR) 10 MG tablet TAKE 1 TABLET BY MOUTH DAILY 90 tablet 1   torsemide (DEMADEX) 20 MG tablet Take 40 mg daily. May add an additional 20 mg on the days of swelling, weight gain or SOB. 270 tablet 1   valsartan (DIOVAN) 320 MG tablet TAKE 1 TABLET BY MOUTH DAILY 90 tablet 1   No facility-administered medications prior to visit.    Allergies  Allergen Reactions   Amlodipine Swelling    Swelling    Sulfonamide Derivatives Swelling    REACTION: Swelling of the face; "eyes swelled shut"   Myrbetriq [Mirabegron]     Facial swelling, sob   Chocolate Other (See Comments)    Sinus problems    ROS    See HPI Objective:    Physical Exam Constitutional:      General: She is not in acute distress.    Appearance: Normal appearance. She is well-developed.  HENT:     Head: Normocephalic and atraumatic.     Right Ear: External ear normal.     Left Ear:  External ear normal.  Eyes:     General: No scleral icterus. Neck:     Thyroid: No thyromegaly.  Cardiovascular:     Rate and Rhythm: Normal rate and regular rhythm.     Heart sounds: Normal heart sounds. No murmur heard. Pulmonary:     Effort: Pulmonary effort is normal. No respiratory distress.     Breath sounds: Normal breath sounds. No wheezing.  Abdominal:     Palpations: Abdomen is soft.  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     Cervical back: Neck supple.  Skin:    General: Skin is warm and  dry.  Neurological:     Mental Status: She is alert and oriented to person, place, and time.  Psychiatric:        Mood and Affect: Mood normal.        Behavior: Behavior normal.        Thought Content: Thought content normal.        Judgment: Judgment normal.      BP (!) 120/50   Pulse 65   Temp 98.3 F (36.8 C) (Oral)   Resp 16   Wt 216 lb (98 kg)   LMP 07/11/1991   SpO2 98%   BMI 33.83 kg/m  Wt Readings from Last 3 Encounters:  04/09/23 216 lb (98 kg)  12/29/22 205 lb 6.4 oz (93.2 kg)  12/05/22 209 lb 9.6 oz (95.1 kg)       Assessment & Plan:   Problem List Items Addressed This Visit       Unprioritized   Seasonal allergies     Symptoms have returned, currently managed with Allegra and flonase. -Continue Allegra and nasal spray as needed.      OSA (obstructive sleep apnea)    Feeling better back on CPAP, continue same.       Hyperlipemia     Well controlled on Simvastatin. -Continue Simvastatin.      Essential hypertension    DBP was a bit low on initial check today.  Repeat is better. Denies dizziness. Recommended that she continue to monitor at home and if she develops recurrent DBP <50 to reach out to me or if she develops dizziness.       Depression    Uncontrolled off of prozac. Recommended that she take prozac at bedtime and take 10mg  every other day to see if she can better tolerate this regimen in regards to drowsiness.       CKD (chronic kidney disease) stage 4, GFR 15-29 ml/min (HCC) - Primary     Recent visit with nephrologist, mild decrease in kidney function noted. -Request nephrologist's notes and labs. -Check kidney function and blood count today.       Relevant Orders   Comp Met (CMET)   Chronic diastolic heart failure (HCC)    Appears clinically euvolemic.       ANEMIA    Update CBC, had normal iron studies.  Mild anemia is likely secondary to her CKD. Her cologard was negative last summer and H/H have remained stable since  that time.      Relevant Orders   CBC w/Diff   Other Visit Diagnoses     Needs flu shot       Relevant Orders   Flu Vaccine Trivalent High Dose (Fluad) (Completed)      General Health Maintenance -Administer influenza vaccine today. -Encourage COVID-19 booster at pharmacy. -Follow-up in 4 months.  I am having [name] "Annice Pih" maintain her fexofenadine, fluticasone, Polyethyl Glycol-Propyl Glycol (SYSTANE OP), lidocaine, Fish Oil, acetaminophen, ketotifen, famotidine, gabapentin, bisoprolol, torsemide, hydrALAZINE, FLUoxetine, Klor-Con M20, simvastatin, and valsartan.  No orders of the defined types were placed in this encounter.

## 2023-04-09 NOTE — Assessment & Plan Note (Signed)
  Symptoms have returned, currently managed with Allegra and flonase. -Continue Allegra and nasal spray as needed.

## 2023-04-09 NOTE — Telephone Encounter (Signed)
Electronic request sent 

## 2023-04-09 NOTE — Assessment & Plan Note (Signed)
DBP was a bit low on initial check today.  Repeat is better. Denies dizziness. Recommended that she continue to monitor at home and if she develops recurrent DBP <50 to reach out to me or if she develops dizziness.

## 2023-04-09 NOTE — Assessment & Plan Note (Signed)
Update CBC, had normal iron studies.  Mild anemia is likely secondary to her CKD. Her cologard was negative last summer and H/H have remained stable since that time.

## 2023-04-09 NOTE — Assessment & Plan Note (Signed)
Uncontrolled off of prozac. Recommended that she take prozac at bedtime and take 10mg  every other day to see if she can better tolerate this regimen in regards to drowsiness.

## 2023-04-09 NOTE — Assessment & Plan Note (Signed)
  Well controlled on Simvastatin. -Continue Simvastatin.

## 2023-04-09 NOTE — Patient Instructions (Signed)
VISIT SUMMARY:  During your recent visit, we discussed your seasonal allergies, sleep apnea, mood fluctuations, kidney function, hypertension, and hyperlipidemia. We also discussed your general health maintenance.  YOUR PLAN:  -SEASONAL ALLERGIES: Your seasonal allergies have returned. Continue using Allegra and nasal spray as needed. These medications help to control your allergy symptoms.  -SLEEP APNEA: Your sleep has improved since you started using the CPAP machine regularly. Continue using it to maintain good sleep quality and reduce daytime napping.  -DEPRESSION: You've been experiencing mood fluctuations due to Prozac. To manage this, take Prozac every other day at bedtime. This should help reduce the drowsiness you've been experiencing.  -CHRONIC KIDNEY DISEASE: You've seen a kidney doctor due to a mild decrease in kidney function. We'll request the nephrologist's notes and labs, and check your kidney function and blood count.  -HYPERTENSION: Your diastolic blood pressure was low during the visit. Monitor your blood pressure at home. If it consistently falls below 50, we may need to decrease your antihypertensive medication.  -HYPERLIPIDEMIA: Your cholesterol levels are well controlled with Simvastatin. Continue taking this medication to maintain healthy cholesterol levels.  -GENERAL HEALTH MAINTENANCE: We administered the influenza vaccine during your visit. We also recommend getting a COVID-19 booster at your local pharmacy. We'll see you again for a follow-up in 4 months.  INSTRUCTIONS:  Please monitor your blood pressure at home and report if your diastolic blood pressure consistently falls below 50. Continue taking your medications as advised. Get a COVID-19 booster at your local pharmacy. We'll see you again for a follow-up in 4 months.

## 2023-04-12 ENCOUNTER — Other Ambulatory Visit: Payer: Medicare Other

## 2023-04-19 ENCOUNTER — Ambulatory Visit: Payer: Medicare Other | Admitting: "Endocrinology

## 2023-04-24 ENCOUNTER — Other Ambulatory Visit: Payer: Self-pay | Admitting: Family

## 2023-06-13 ENCOUNTER — Other Ambulatory Visit: Payer: Self-pay | Admitting: Nurse Practitioner

## 2023-06-14 ENCOUNTER — Other Ambulatory Visit: Payer: Self-pay | Admitting: Family

## 2023-06-18 ENCOUNTER — Ambulatory Visit: Payer: Medicare Other | Attending: Internal Medicine | Admitting: Internal Medicine

## 2023-06-18 ENCOUNTER — Encounter: Payer: Self-pay | Admitting: Internal Medicine

## 2023-06-18 VITALS — BP 106/56 | HR 63 | Ht 64.0 in | Wt 220.0 lb

## 2023-06-18 DIAGNOSIS — I10 Essential (primary) hypertension: Secondary | ICD-10-CM

## 2023-06-18 DIAGNOSIS — R002 Palpitations: Secondary | ICD-10-CM | POA: Diagnosis not present

## 2023-06-18 DIAGNOSIS — E66813 Obesity, class 3: Secondary | ICD-10-CM | POA: Diagnosis not present

## 2023-06-18 DIAGNOSIS — I5032 Chronic diastolic (congestive) heart failure: Secondary | ICD-10-CM

## 2023-06-18 DIAGNOSIS — G4733 Obstructive sleep apnea (adult) (pediatric): Secondary | ICD-10-CM

## 2023-06-18 NOTE — Progress Notes (Signed)
OFFICE NOTE  Chief Complaint:  No complaints  Primary Care Physician: Sandford Craze, NP  HPI:  [name] is a 82 y.o. female who is currently referred to me for evaluation of palpitations. While [name] has noted some palpitations, she reports not being particularly bothered by them. Her past medical history significant for an episode of congestive heart failure back in January 2016. She presented with symptoms of anasarca including upper and lower extremity swelling. The time she was on amlodipine which was discontinued and she was started on Lasix. She had significant diuresis and an echocardiogram, however did showed normal systolic and reportedly diastolic function. Based on this findings is difficult to understand whether this was cardiac edema or some other cause of volume overload. Nonetheless, recently she's had some palpitations and was fitted with a monitor. The monitor demonstrated PACs, PVCs and an episode of SVT which was transient and spontaneously terminated. Does note that she's been having some more frequent episodes of palpitations but they are not significantly bothersome to her. She also reports some heaviness in her chest when she does activities, although she is very inactive due to her hip problem and walks with cane. She also has morbid obesity and hypertension. She does take Bystolic is one of her blood pressure medications. In addition I asked her screening questions for possible sleep apnea. She does report daytime fatigue, somnolence, poor sleep at night, witnessed snoring and occasional episodes of apnea. Her Epworth sleepiness score scale is greater than 10.  12/07/2015  [name] was seen today in follow-up of her recent stress test. This was negative for ischemia, was interpreted as low risk and showed an LVEF of 64%. She reports since her stress test her palpitations have improved quite a bit. She is on bisoprolol 10 mg twice a day. She has not  yet had her sleep study which is coming up on June 15. She does worry that she may not be able sleep well enough for that study. She typically goes to bed at 2 AM and sleeps till about 6 AM. She says she often naps during the day and this could be playing a role in her poor sleep at night. While she is tried to go to bed earlier she says she cannot fall asleep. She also reports that she's had about 5 pound weight gain over the past 2 days with notable increase in lower extremity swelling. She and her husband report increase salt intake with various foods over the holiday weekend.  03/08/2016  [name] returns back today for follow-up. She reported some improvement in her swelling with an increase in Lasix. In the interim she underwent a sleep study and was found to have significant sleep apnea requiring BiPAP. This is been fitted and she's been wearing it regularly and notes that she's had an improvement in her symptoms and energy level. Unfortunately she is grieving as she recently lost her husband to acute MI. He also had suffered with cancer for a number of years.  09/07/2016  [name] returns today for follow-up. She has managed to lose a few pounds since we last saw her. She reports her mood is improved, understandably after the death of her husband fairly recently. She reports good compliance with BiPAP recently saw Dr. Tresa Endo who reports that she is doing well on her current settings. She has responded some increased dose Lasix for swelling and generally does well wearing her compression stockings. EKG today shows normal sinus  rhythm. She does report some occasional lower blood pressures in the morning which he takes all of her medications.  03/15/2017  [name] was seen today in follow-up. She reports increase in weight and lower extremity swelling. She's currently taking 20 mg twice daily. She's been compliant with her BiPAP and generally gets about 5 hours of sleep at night. She notes less  frequent palpitations. She denies any chest pain. Her blood pressure is now better controlled with the addition of clonidine.  02/03/2020  [name] seen today in follow-up.  I last saw her in September 2018.  Since then she has been seeing her primary care both in person and virtually.  She has actually lost some weight.  Weight is now 232 down from 248 pounds.  Unfortunately she had recent fall and injury to the hip as well as the ankles.  She still having difficulty walking related to that.  She denies any worsening shortness of breath.  Blood pressure has been running higher up to 180 today.  In general she says is around 150 systolic.  One point she was on clonidine but was taken off of due to side effects and actually became hypotensive.  04/05/2020  [name] returns today for follow-up.  I had switched up her ARB to valsartan 320 mg daily.  She seems to be tolerating this with some improvement in blood pressures now around 140/90.  She says at home her diastolics are actually much lower in the mid 60s.  Renal function is stable if not slightly improved by bmet.  She continues to work to lose weight.  03/25/2021  [name] returns today for follow-up.  Overall she says she is doing well.  Blood pressure was elevated today however it tends to be in the office.  She says her home blood pressures generally run around 130/70.  She has had her blood pressure cuff validated with the in office readings.  Similar readings are at her primary care doctor's office.  She is on a combination of medications for blood pressure.  She notes she gets some occasional lightheadedness after taking her medicines in the morning but generally has been taking valsartan once a day and night.  06/18/2023  [name] is seen today in follow-up.  She has no active cardiac complaints.  She reports her lower extremity edema has been reasonably well-controlled with torsemide.  There has been some interim weight gain and less  activity.  She is in a wheelchair today.  Blood pressure was surprisingly low initially at 90/50 however recheck came up to 106/56.  She says her home blood pressure readings are generally around 120 systolic.  EKG today shows normal sinus rhythm.  Lipids are well-controlled with an LDL 51 in May.  PMHx:  Past Medical History:  Diagnosis Date   Allergic rhinitis    Allergy    Ankle fracture    Left, Mortise Widening   Anxiety 2020   Arthritis    "knuckles" (06/17/2012)   Borderline diabetes mellitus 03/06/2011   Breast cancer (HCC)    "right" (06/17/2012)   Cataracts, bilateral    Depression 2020   Displaced fracture of distal end of left fibula 07/30/2017   Dyspnea    with exertion   Exertional dyspnea    GERD (gastroesophageal reflux disease)    Hypertension    Iron deficiency anemia    "hematologist watches it" (06/17/2012)   Obesity    OSA treated with BiPAP 03/08/2016   Osteoarthritis  severe right hip osteoarthritis-s/p hip replacement   Osteopenia 11/26/2009   Thyroid disorder    "sees endocrinologist yearly" (06/17/2012)    Past Surgical History:  Procedure Laterality Date   ANKLE FRACTURE SURGERY Right 01/01/2014   Has pins. Procedure performed at [facility]   BREAST BIOPSY  ` 1992   "bilaterlly" (06/17/2012)   COLONOSCOPY W/ POLYPECTOMY     EYE SURGERY  2011   cataract removal both eyes   FRACTURE SURGERY     HIP CLOSED REDUCTION  07/26/2012   Procedure: CLOSED MANIPULATION HIP;  Surgeon: Mable Paris, MD;  Location: [facility];  Service: Orthopedics;  Laterality: Right;   JOINT REPLACEMENT  2007   LUMBAR LAMINECTOMY  10/26/2017   LUMBAR LAMINECTOMY/DECOMPRESSION MICRODISCECTOMY Bilateral 10/25/2017   Procedure: LUMBAR 2-3, LUMBAR 3-4 DECOMPRESSION TIME REQUESTED 4.5 HOURS;  Surgeon: Estill Bamberg, MD;  Location: [facility] OR;  Service: Orthopedics;  Laterality: Bilateral;   MASTECTOMY  1993?   bilateral mastectomy   ORIF ANKLE FRACTURE Left  07/30/2017   Procedure: LEFT OPEN REDUCTION INTERNAL FIXATION (ORIF) ANKLE FRACTURE WITH SYDESMOTIC FIXATION;  Surgeon: Jodi Geralds, MD;  Location: [facility] OR;  Service: Orthopedics;  Laterality: Left;   TONSILLECTOMY  1949   TOTAL HIP ARTHROPLASTY  04/2006   right hip    TOTAL HIP REVISION  06/17/2012   "right" (06/17/2012)   TOTAL HIP REVISION  06/17/2012   Procedure: TOTAL HIP REVISION;  Surgeon: [name], MD;  Location: [facility] OR;  Service: Orthopedics;  Laterality: Right;    FAMHx:  Family History  Problem Relation Age of Onset   Heart failure Father        deceased age 39   Diabetes Father    Alcohol abuse Father    Breast cancer Mother        deceased at age 43 secondary to breast cancer   Liver disease Sister        Liver failure--deceased   Dementia Maternal Grandmother    Colon cancer Neg Hx    Esophageal cancer Neg Hx    Rectal cancer Neg Hx    Stomach cancer Neg Hx     SOCHx:   reports that she quit smoking about 48 years ago. Her smoking use included cigarettes. She started smoking about 58 years ago. She has a 5 pack-year smoking history. She has never used smokeless tobacco. She reports current alcohol use of about 4.0 standard drinks of alcohol per week. She reports that she does not use drugs.  ALLERGIES:  Allergies  Allergen Reactions   Amlodipine Swelling    Swelling    Sulfonamide Derivatives Swelling    REACTION: Swelling of the face; "eyes swelled shut"   Myrbetriq [Mirabegron]     Facial swelling, sob   Chocolate Other (See Comments)    Sinus problems    ROS: Pertinent items noted in HPI and remainder of comprehensive ROS otherwise negative.  HOME MEDS: Current Outpatient Medications  Medication Sig Dispense Refill   acetaminophen (TYLENOL) 500 MG tablet Take 1,000 mg by mouth in the morning, at noon, and at bedtime. Take with Gabapetin     bisoprolol (ZEBETA) 10 MG tablet TAKE 2 TABLETS BY MOUTH DAILY 180 tablet 3   famotidine (PEPCID) 20 MG  tablet TAKE 1 TABLET BY MOUTH TWICE  DAILY 180 tablet 3   fexofenadine (ALLEGRA) 180 MG tablet Take 180 mg by mouth daily as needed. For seasonal allergies     FLUoxetine (PROZAC) 10 MG capsule Take 1  capsule (10 mg total) by mouth at bedtime. 90 capsule 2   fluticasone (FLONASE) 50 MCG/ACT nasal spray USE 1 SPRAY IN EACH NOSTRIL EVERY DAY (Patient taking differently: Place 1 spray into both nostrils daily as needed.) 32 g 1   gabapentin (NEURONTIN) 100 MG capsule TAKE 1 CAPSULE BY MOUTH 3 TIMES  DAILY 270 capsule 3   hydrALAZINE (APRESOLINE) 25 MG tablet TAKE 1 TABLET BY MOUTH IN THE  MORNING AND 1 TABLET BY MOUTH AT BEDTIME 180 tablet 3   ketotifen (ZADITOR) 0.025 % ophthalmic solution Place 1 drop into both eyes 2 (two) times daily as needed.     KLOR-CON M20 20 MEQ tablet TAKE 1 TABLET BY MOUTH EVERY DAY 90 tablet 1   Lidocaine 4 % PTCH Apply 1 patch topically daily as needed (pain).     Omega-3 Fatty Acids (FISH OIL) 600 MG CAPS Take 2 capsules by mouth daily.     Polyethyl Glycol-Propyl Glycol (SYSTANE OP) Apply 1 drop to eye daily as needed (dry eyes).     simvastatin (ZOCOR) 10 MG tablet TAKE 1 TABLET BY MOUTH DAILY 90 tablet 1   torsemide (DEMADEX) 20 MG tablet TAKE 40 MG DAILY. MAY ADD AN ADDITIONAL 20 MG ON THE DAYS OF SWELLING, WEIGHT GAIN OR SOB. 270 tablet 1   valsartan (DIOVAN) 320 MG tablet TAKE 1 TABLET BY MOUTH DAILY 90 tablet 1   No current facility-administered medications for this visit.    LABS/IMAGING: No results found for this or any previous visit (from the past 48 hour(s)). No results found.  WEIGHTS: Wt Readings from Last 3 Encounters:  06/18/23 220 lb (99.8 kg)  04/09/23 216 lb (98 kg)  12/29/22 205 lb 6.4 oz (93.2 kg)    VITALS: BP (!) 90/50   Pulse 63   Ht 5\' 4"  (1.626 m)   Wt 220 lb (99.8 kg)   LMP 07/11/1991   SpO2 97%   BMI 37.76 kg/m   EXAM: General appearance: alert, no distress, and morbidly obese Neck: no carotid bruit, no JVD, and thyroid  not enlarged, symmetric, no tenderness/mass/nodules Lungs: clear to auscultation bilaterally Heart: regular rate and rhythm, S1, S2 normal, no murmur, click, rub or gallop Abdomen: soft, non-tender; bowel sounds normal; no masses,  no organomegaly Extremities: extremities normal, atraumatic, no cyanosis or edema Pulses: 2+ and symmetric Skin: Skin color, texture, turgor normal. No rashes or lesions Neurologic: Grossly normal : Pleasant  EKG: EKG Interpretation Date/Time:  Monday June 18 2023 10:52:24 EST Ventricular Rate:  63 PR Interval:  162 QRS Duration:  98 QT Interval:  408 QTC Calculation: 417 R Axis:   5  Text Interpretation: Normal sinus rhythm Normal ECG When compared with ECG of 10-Jun-2012 12:14, Nonspecific T wave abnormality no longer evident in Anterior leads Confirmed by Zoila Shutter [phone]) on 06/18/2023 11:23:44 AM    ASSESSMENT: Chronic diastolic CHF Palpitations-PSVT, PACs, PVCs Chest pressure - low risk Myoview stress test, EF 64% (2017) Dyspnea on exertion Essential hypertension Morbid obesity Family history of coronary disease OSA on BIPAP CKD2  PLAN:  1.   Mrs Steger seems to be clinically stable with regards to chronic diastolic heart failure.  Her lower extremity edema is reasonably well treated on torsemide.  There has been weight gain however over the past 6 months of about 15 pounds.  She is not very active.  I encouraged her to continue to work on this.  She understands if she has additional swelling she can take more  torsemide.  Blood pressure is well controlled typically although was low today.  She should monitor this at home and if her blood pressure is low again this afternoon that I would advise not taking her to hydralazine tonight.  She reports her palpitations are very well-controlled.  Plan follow-up in 6 months with Bernadene Person, DNP.  Chrystie Nose, MD, [facility], FACP  [place]  [facility]  Medical Director of the Advanced  Lipid Disorders &  Cardiovascular Risk Reduction Clinic Diplomate of the American Board of Clinical Lipidology Attending Cardiologist  Direct Dial: [phone]  Fax: [phone]  Website:  [email or web address] Nicely Elnore Cosens 06/18/2023, 11:23 AM

## 2023-06-18 NOTE — Patient Instructions (Signed)
Medication Instructions:  OK to hold hydralazine this evening is BP is low Check BPs at home  *If you need a refill on your cardiac medications before your next appointment, please call your pharmacy*   Follow-Up: At [facility], you and your health needs are our priority.  As part of our continuing mission to provide you with exceptional heart care, we have created designated Provider Care Teams.  These Care Teams include your primary Cardiologist (physician) and Advanced Practice Providers (APPs -  Physician Assistants and Nurse Practitioners) who all work together to provide you with the care you need, when you need it.  We recommend signing up for the patient portal called "MyChart".  Sign up information is provided on this After Visit Summary.  MyChart is used to connect with patients for Virtual Visits (Telemedicine).  Patients are able to view lab/test results, encounter notes, upcoming appointments, etc.  Non-urgent messages can be sent to your provider as well.   To learn more about what you can do with MyChart, go to [email or web address].    Your next appointment:    6 months with Bernadene Person NP

## 2023-06-19 ENCOUNTER — Encounter: Payer: Self-pay | Admitting: Family

## 2023-06-20 ENCOUNTER — Other Ambulatory Visit: Payer: Self-pay

## 2023-06-20 MED ORDER — POTASSIUM CHLORIDE CRYS ER 20 MEQ PO TBCR: 20.0000 meq | EXTENDED_RELEASE_TABLET | Freq: Every day | ORAL | 1 refills | Status: DC

## 2023-07-02 ENCOUNTER — Other Ambulatory Visit: Payer: Self-pay | Admitting: Family

## 2023-08-14 ENCOUNTER — Ambulatory Visit: Payer: Medicare Other | Admitting: Family

## 2023-08-18 ENCOUNTER — Other Ambulatory Visit: Payer: Self-pay | Admitting: Family

## 2023-08-21 ENCOUNTER — Ambulatory Visit: Payer: Medicare Other | Admitting: Family

## 2023-08-21 NOTE — Telephone Encounter (Signed)
Please contact pt to schedule a follow up visit.

## 2023-08-22 NOTE — Telephone Encounter (Signed)
Lvm 2 sch.

## 2023-09-05 ENCOUNTER — Other Ambulatory Visit: Payer: Self-pay | Admitting: Family

## 2023-09-15 ENCOUNTER — Encounter: Payer: Self-pay | Admitting: Cardiology

## 2023-09-15 DIAGNOSIS — I4891 Unspecified atrial fibrillation: Secondary | ICD-10-CM | POA: Diagnosis not present

## 2023-09-17 ENCOUNTER — Telehealth: Payer: Self-pay | Admitting: Internal Medicine

## 2023-09-17 NOTE — Telephone Encounter (Signed)
 Pt c/o medication issue:  1. Name of Medication: Propranolol   2. How are you currently taking this medication (dosage and times per day)?   3. Are you having a reaction (difficulty breathing--STAT)?   4. What is your medication issue? Patient is requesting call back to get clarification of this medication who is currently in [facility]. Please advice.

## 2023-09-17 NOTE — Telephone Encounter (Signed)
 Spoke with nurse at [facility], aware the patient is taking bisoprolol not propranolol.

## 2023-09-18 DIAGNOSIS — I4891 Unspecified atrial fibrillation: Secondary | ICD-10-CM | POA: Diagnosis not present

## 2023-10-05 ENCOUNTER — Telehealth: Payer: Self-pay | Admitting: Family

## 2023-10-05 NOTE — Telephone Encounter (Signed)
 Copied from CRM [id number]. Topic: Medicare AWV >> Oct 05, 2023  9:27 AM Payton Doughty wrote: Reason for CRM: Called LVM 10/04/2023 to schedule AWV. Please schedule Virtual or Telehealth visits ONLY.   [name]; Care Guide Ambulatory Clinical Support [place] [facility] Direct Dial: [phone]

## 2023-10-14 ENCOUNTER — Encounter: Payer: Self-pay | Admitting: Internal Medicine

## 2023-10-15 ENCOUNTER — Telehealth: Payer: Self-pay

## 2023-10-15 NOTE — Transitions of Care (Post Inpatient/ED Visit) (Signed)
 10/15/2023  Name: [name] MRN: [id number] DOB: [birth year]  Today's TOC FU Call Status: Today's TOC FU Call Status:: Successful TOC FU Call Completed TOC FU Call Complete Date: 10/15/23 Patient's Name and Date of Birth confirmed.  Transition Care Management Follow-up Telephone Call Date of Discharge: 10/12/23 Discharge Facility: Other [facility]) Name of Other (Non[facility]) Discharge Facility: [facility] Type of Discharge: Inpatient Admission Primary Inpatient Discharge Diagnosis:: heart failure How have you been since you were released from the hospital?: Better Any questions or concerns?: No  Items Reviewed: Did you receive and understand the discharge instructions provided?: Yes Medications obtained,verified, and reconciled?: Yes (Medications Reviewed) Any new allergies since your discharge?: No Dietary orders reviewed?: Yes Do you have support at home?: Yes People in Home [RPT]: friend(s)  Medications Reviewed Today: Medications Reviewed Today     Reviewed by Karena Addison, LPN (Licensed Practical Nurse) on 10/15/23 at 1619  Med List Status: <None>   Medication Order Taking? Sig Documenting Provider Last Dose Status Informant  acetaminophen (TYLENOL) 500 MG tablet [id number] Yes Take 1,000 mg by mouth in the morning, at noon, and at bedtime. Take with [facility] [provider] Taking Active   bisoprolol (ZEBETA) 10 MG tablet [id number] Yes TAKE 2 TABLETS BY MOUTH DAILY Worthy Rancher B, FNP Taking Active   cholecalciferol (VITAMIN D3) 25 MCG (1000 UNIT) tablet [id number] Yes Take 1,000 Units by mouth daily. [provider] Taking Active   diltiazem (CARDIZEM CD) 120 MG 24 hr capsule [id number] Yes Take 120 mg by mouth every morning. [provider] Taking Active   ELIQUIS 2.5 MG TABS tablet [id number] Yes Take 2.5 mg by mouth 2 (two) times daily. [provider] Taking Active   famotidine (PEPCID) 20 MG tablet [id number] Yes TAKE 1  TABLET BY MOUTH TWICE  DAILY Bradd Canary, MD Taking Active            Med Note Willa Rough, Virgilio Frees   Mon Sep 25, 2022 10:59 AM)    fexofenadine (ALLEGRA) 180 MG tablet [id number] Yes Take 180 mg by mouth daily as needed. For seasonal allergies [provider] Taking Active Self  FLUoxetine (PROZAC) 10 MG capsule [id number] Yes TAKE 1 CAPSULE BY MOUTH AT  BEDTIME Sandford Craze, NP Taking Active   fluticasone (FLONASE) 50 MCG/ACT nasal spray [id number] Yes USE 1 SPRAY IN EACH NOSTRIL EVERY DAY  Patient taking differently: Place 1 spray into both nostrils daily as needed.   Sandford Craze, NP Taking Active Self  gabapentin (NEURONTIN) 100 MG capsule [id number] Yes TAKE 1 CAPSULE BY MOUTH 3 TIMES  DAILY Sandford Craze, NP Taking Active   hydrALAZINE (APRESOLINE) 25 MG tablet [id number] Yes TAKE 1 TABLET BY MOUTH IN THE  MORNING AND 1 TABLET BY MOUTH AT BEDTIME Sandford Craze, NP Taking Active   ketotifen (ZADITOR) 0.025 % ophthalmic solution [id number] Yes Place 1 drop into both eyes 2 (two) times daily as needed. [provider] Taking Active   Lidocaine 4 % PTCH [id number] Yes Apply 1 patch topically daily as needed (pain). [provider] Taking Active Self  Omega-3 Fatty Acids (FISH OIL) 600 MG CAPS [id number] Yes Take 2 capsules by mouth daily. [provider] Taking Active   Polyethyl Glycol-Propyl Glycol (SYSTANE OP) [id number] Yes Apply 1 drop to eye daily as needed (dry eyes). [provider] Taking Active Self  potassium chloride SA (KLOR-CON M20) 20 MEQ tablet [id number] Yes Take 1 tablet (20 mEq total) by mouth daily. Sandford Craze, NP Taking  Active   simvastatin (ZOCOR) 10 MG tablet [id number] Yes TAKE 1 TABLET BY MOUTH DAILY Sandford Craze, NP Taking Active   torsemide (DEMADEX) 20 MG tablet [id number] Yes TAKE 40 MG DAILY. MAY ADD AN ADDITIONAL 20 MG ON THE DAYS OF SWELLING, WEIGHT GAIN OR SOB. Joylene Grapes, NP Taking Active    valsartan (DIOVAN) 320 MG tablet [id number] Yes TAKE 1 TABLET BY MOUTH DAILY Sandford Craze, NP Taking Active             Home Care and Equipment/Supplies: Were Home Health Services Ordered?: Yes Name of Home Health Agency:: [facility] Has Agency set up a time to come to your home?: Yes First Home Health Visit Date: 10/16/23 Any new equipment or medical supplies ordered?: NA  Functional Questionnaire: Do you need assistance with bathing/showering or dressing?: No Do you need assistance with meal preparation?: No Do you need assistance with eating?: No Do you have difficulty maintaining continence: No Do you need assistance with getting out of bed/getting out of a chair/moving?: No Do you have difficulty managing or taking your medications?: No  Follow up appointments reviewed: PCP Follow-up appointment confirmed?: No (declined, son will call to schedule) MD Provider Line Number:[phone] Given: No Specialist Hospital Follow-up appointment confirmed?: NA Do you need transportation to your follow-up appointment?: No Do you understand care options if your condition(s) worsen?: Yes-patient verbalized understanding    SIGNATURE Karena Addison, LPN [facility] Nurse Health Advisor Direct Dial [phone]

## 2023-10-17 ENCOUNTER — Telehealth: Payer: Self-pay

## 2023-10-17 NOTE — Telephone Encounter (Signed)
 Called home and cell but no answer, lvm for patient to call and schedule an appointment in the next few days.

## 2023-10-17 NOTE — Telephone Encounter (Signed)
 Copied from CRM [id number]. Topic: Clinical - Home Health Verbal Orders >> Oct 17, 2023  8:35 AM Darl Householder wrote: Caller/Agency: [facility] Callback Number: [phone] Service Requested: Physical Therapy Frequency: twice a week (5 weeks), once a week (4 weeks) Any new concerns about the patient? Yes- swelling in leg

## 2023-10-17 NOTE — Telephone Encounter (Signed)
 My records show that she went to [facility] on 4/4.  If she is at home we need to set up a hospital follow up visit with me in the next 1 week please.

## 2023-10-18 NOTE — Telephone Encounter (Signed)
 Appointment moved to a closer date, 10/19/23.

## 2023-10-18 NOTE — Telephone Encounter (Signed)
 Patient was scheduled to come in 10/23/23 for follow up. LVM for HH to be aware we will call for orders after we see patient.

## 2023-10-18 NOTE — Telephone Encounter (Signed)
 Copied from CRM [id number]. Topic: Clinical - Home Health Verbal Orders >> Oct 18, 2023  8:45 AM Drema Balzarine wrote: Caller/Agency: [facility]  Callback Number: [phone] Service Requested: Physical Therapy Frequency:  twice a week (5 weeks), once a week (4 weeks) Any new concerns about the patient? Yes, calling to follow up from 4/9 message - I let her know that the patient was called to schedule and appointment. Jae Dire requested a call back today regarding frequency of Physical Therapy

## 2023-10-19 ENCOUNTER — Ambulatory Visit: Admitting: Family

## 2023-10-19 VITALS — BP 101/84 | HR 54 | Temp 97.5°F | Resp 18 | Ht 64.0 in | Wt 239.0 lb

## 2023-10-19 DIAGNOSIS — R609 Edema, unspecified: Secondary | ICD-10-CM | POA: Diagnosis not present

## 2023-10-19 DIAGNOSIS — I5032 Chronic diastolic (congestive) heart failure: Secondary | ICD-10-CM

## 2023-10-19 DIAGNOSIS — N184 Chronic kidney disease, stage 4 (severe): Secondary | ICD-10-CM | POA: Diagnosis not present

## 2023-10-19 DIAGNOSIS — J301 Allergic rhinitis due to pollen: Secondary | ICD-10-CM

## 2023-10-19 DIAGNOSIS — I10 Essential (primary) hypertension: Secondary | ICD-10-CM

## 2023-10-19 DIAGNOSIS — I4891 Unspecified atrial fibrillation: Secondary | ICD-10-CM | POA: Insufficient documentation

## 2023-10-19 DIAGNOSIS — F32A Depression, unspecified: Secondary | ICD-10-CM

## 2023-10-19 DIAGNOSIS — K219 Gastro-esophageal reflux disease without esophagitis: Secondary | ICD-10-CM

## 2023-10-19 MED ORDER — TORSEMIDE 20 MG PO TABS: 40.0000 mg | ORAL_TABLET | Freq: Two times a day (BID) | ORAL | Status: DC

## 2023-10-19 NOTE — Assessment & Plan Note (Signed)
 Will update renal function today

## 2023-10-19 NOTE — Patient Instructions (Signed)
 VISIT SUMMARY:  Today, you came in for a follow-up visit after your recent hospitalization for a severe infection. You were accompanied by your neighbor. We discussed your recovery, current medications, and any new symptoms or concerns you have been experiencing since your discharge.  YOUR PLAN:  -ACUTE KIDNEY FAILURE: Acute kidney failure means your kidneys suddenly stopped working properly, likely due to the severe infection. We will order baseline labs to check your kidney function and request your hospital records from [facility] to monitor your recovery.  -ATRIAL FIBRILLATION: Atrial fibrillation is an irregular and often rapid heart rate that can increase your risk of strokes and other heart-related complications. You should continue taking diltiazem 120 mg once daily and Eliquis 2.5 mg twice daily. We will arrange a follow-up with your cardiologist, Dr. Rennis Golden, and consider using a week-long heart monitor to check for persistent atrial fibrillation.  -PERIPHERAL EDEMA: Peripheral edema is swelling caused by fluid retention, which may be worsened by your medication, diltiazem. We will increase your torsemide to 40 mg twice daily to help manage this. Please monitor your weight daily and report any sudden weight gain. Also, try to elevate your legs when you are at home.  -GENERAL HEALTH MAINTENANCE: Your mobility and confidence have improved since your rehabilitation. Continue using your walker for mobility, maintain the home modifications and monitoring systems, and consider getting a medical alert system for added safety.  INSTRUCTIONS:  Please follow up with your cardiologist, Dr. Rennis Golden, as arranged. Monitor your weight daily and report any sudden weight gain of 3 pounds overnight or 5 pounds in a week. Elevate your legs when at home to help with swelling. We will also order baseline labs to check your kidney function and request your hospital records from [place].

## 2023-10-19 NOTE — Progress Notes (Signed)
 Subjective:     Patient ID: [name], female    DOB: Feb 10, 1941, 83 y.o.   MRN: [id number]  Chief Complaint  Patient presents with   Follow-up    Follow up after hospitalization and rehab    HPI  Discussed the use of AI scribe software for clinical note transcription with the patient, who gave verbal consent to proceed.  History of Present Illness [name] "[name]" is an 83 year old female who presents for follow-up after hospitalization for a severe infection. She is accompanied by her son.  Hospitalization records from [facility] are not available at today's visit.    She was hospitalized in [place] on March 7th due to a "severe infection" that led to confusion and a fall that occurred at home. Apparently, the infection was not identified, but she was treated with broad spectrum antibiotics. She was found by a neighbor who called an ambulance. She was admitted to the ICU for close to a week and then transferred to [facility] for rehabilitation, where she stayed until the previous Friday. During her hospitalization, she was treated with broad-spectrum antibiotics, which helped resolve her confusion. She experienced acute kidney failure and metabolic encephalopathy during her hospitalization, which contributed to her confusion. She did not sustain any fractures from her fall.  During her hospital stay, it sounds like she was in atrial fibrillation. She was started on diltiazem and eliquis.  She has a history of SVT and PVCs but no prior documented atrial fibrillation in our record. Valsartan was discontinued during her hospitalization.  She feels like her LE edema was worsened once she was place on diltiazem 120 mg once daily. Her current medications include torsemide 40 mg twice daily, simvastatin 10 mg, potassium 20 mEq daily, hydralazine 25 mg twice daily, gabapentin 100 mg three times daily, Flonase, and Prozac 10 mg at night. She has not resumed Allegra or Pepcid  since her hospitalization. She denies significant allergy symptoms or gerd symptoms.  She is currently working with home health PT and son notes significant improvement in her strength and mobility compared to prior to her hospitalization.    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   Breast cancer (HCC)    "right" (06/17/2012)   Cataracts, bilateral    Depression 2020   Displaced fracture of distal end of left fibula 07/30/2017   Dyspnea    with exertion   Exertional dyspnea    GERD (gastroesophageal reflux disease)    Hypertension    Iron deficiency anemia    "hematologist watches it" (06/17/2012)   Obesity    OSA treated with BiPAP 03/08/2016   Osteoarthritis    severe right hip osteoarthritis-s/p hip replacement   Osteopenia 11/26/2009   Thyroid disorder    "sees endocrinologist yearly" (06/17/2012)    Past Surgical History:  Procedure Laterality Date   ANKLE FRACTURE SURGERY Right 01/01/2014   Has pins. Procedure performed at [facility]  BREAST BIOPSY  ` 1992   "bilaterlly" (06/17/2012)   COLONOSCOPY W/ POLYPECTOMY     EYE SURGERY  2011   cataract removal both eyes   FRACTURE SURGERY     HIP CLOSED REDUCTION  07/26/2012   Procedure: CLOSED MANIPULATION HIP;  Surgeon: Mable Paris, MD;  Location: [facility] ORS;  Service: Orthopedics;  Laterality: Right;   JOINT REPLACEMENT  2007   LUMBAR LAMINECTOMY  10/26/2017   LUMBAR LAMINECTOMY/DECOMPRESSION MICRODISCECTOMY Bilateral 10/25/2017   Procedure: LUMBAR 2-3, LUMBAR 3-4  DECOMPRESSION TIME REQUESTED 4.5 HOURS;  Surgeon: Estill Bamberg, MD;  Location: [facility] OR;  Service: Orthopedics;  Laterality: Bilateral;   MASTECTOMY  1993?   bilateral mastectomy   ORIF ANKLE FRACTURE Left 07/30/2017   Procedure: LEFT OPEN REDUCTION INTERNAL FIXATION (ORIF) ANKLE FRACTURE WITH SYDESMOTIC FIXATION;  Surgeon: Jodi Geralds, MD;  Location: [facility] OR;  Service: Orthopedics;  Laterality: Left;   TONSILLECTOMY  1949   TOTAL HIP ARTHROPLASTY  04/2006   right hip    TOTAL HIP REVISION  06/17/2012   "right" (06/17/2012)   TOTAL HIP REVISION  06/17/2012   Procedure: TOTAL HIP REVISION;  Surgeon: [name], MD;  Location: [facility] OR;  Service: Orthopedics;  Laterality: Right;    Family History  Problem Relation Age of Onset   Heart failure Father        deceased age 65   Diabetes Father    Alcohol abuse Father    Breast cancer Mother        deceased at age 75 secondary to breast cancer   Liver disease Sister        Liver failure--deceased   Dementia Maternal Grandmother    Colon cancer Neg Hx    Esophageal cancer Neg Hx    Rectal cancer Neg Hx    Stomach cancer Neg Hx     Social History   Socioeconomic History   Marital status: Widowed    Spouse name: Not on file   Number of children: Not on file   Years of education: Not on file   Highest education level: Bachelor's degree (e.g., BA, AB, BS)  Occupational History   Not on file  Tobacco Use   Smoking status: Former    Current packs/day: 0.00    Average packs/day: 0.5 packs/day for 10.0 years (5.0 ttl pk-yrs)    Types: Cigarettes    Start date: 07/14/1964    Quit date: 07/14/1974    Years since quitting: 49.2   Smokeless tobacco: Never   Tobacco comments:    quit in 1976 smoked for approx. 10 years  Vaping Use   Vaping status: Never Used  Substance and Sexual Activity   Alcohol use: Yes    Alcohol/week: 4.0 standard drinks of alcohol    Types: 4 Glasses of wine per week    Comment: Glass of wine once in awhile.    Drug use: No   Sexual activity: Never  Other Topics Concern   Not on file  Social History Narrative   Retired   The patient is married and has one child and two grandchildren that live in the area.     She drinks wine with dinner.  She quit smoking in 1976, smoked for   approximately 10 years.       Moved to [place] from [place], [place]         Social Drivers of Health   Financial Resource Strain: Low Risk  (08/15/2023)   Overall Financial Resource Strain (CARDIA)  Difficulty of Paying Living Expenses: Not hard at all  Food Insecurity: No Food Insecurity (08/15/2023)   Hunger Vital Sign    Worried About Running Out of Food in the Last Year: Never true    Ran Out of Food in the Last Year: Never true  Transportation Needs: Unmet Transportation Needs (08/15/2023)   PRAPARE - Administrator, Civil Service (Medical): Yes    Lack of Transportation (Non-Medical): No  Physical Activity: Inactive (08/15/2023)   Exercise Vital Sign    Days of Exercise per Week: 0 days    Minutes of Exercise per Session: 0 min  Stress: No Stress Concern Present (08/15/2023)   [employer] of Occupational Health - Occupational Stress Questionnaire    Feeling of Stress : Only a little  Social Connections: Unknown (08/15/2023)   Social Connection and Isolation Panel [NHANES]    Frequency of Communication with Friends and Family: More than three times a week    Frequency of Social Gatherings with Friends and Family: More than three times a week    Attends Religious Services: Patient declined    Database administrator or Organizations: No    Attends Banker Meetings: Never    Marital Status: Widowed  Intimate Partner Violence: Not At Risk (11/22/2021)   Humiliation, Afraid, Rape, and Kick questionnaire    Fear of Current or Ex-Partner: No    Emotionally Abused: No    Physically Abused: No    Sexually Abused: No    Outpatient Medications Prior to Visit  Medication Sig Dispense Refill    acetaminophen (TYLENOL) 500 MG tablet Take 1,000 mg by mouth in the morning, at noon, and at bedtime. Take with Gabapetin     bisoprolol (ZEBETA) 10 MG tablet TAKE 2 TABLETS BY MOUTH DAILY 180 tablet 3   cholecalciferol (VITAMIN D3) 25 MCG (1000 UNIT) tablet Take 1,000 Units by mouth daily.     diltiazem (CARDIZEM CD) 120 MG 24 hr capsule Take 120 mg by mouth every morning.     ELIQUIS 2.5 MG TABS tablet Take 2.5 mg by mouth 2 (two) times daily.     fexofenadine (ALLEGRA) 180 MG tablet Take 180 mg by mouth daily as needed. For seasonal allergies     FLUoxetine (PROZAC) 10 MG capsule TAKE 1 CAPSULE BY MOUTH AT  BEDTIME 90 capsule 3   fluticasone (FLONASE) 50 MCG/ACT nasal spray USE 1 SPRAY IN EACH NOSTRIL EVERY DAY (Patient taking differently: Place 1 spray into both nostrils daily as needed.) 32 g 1   gabapentin (NEURONTIN) 100 MG capsule TAKE 1 CAPSULE BY MOUTH 3 TIMES  DAILY 270 capsule 1   hydrALAZINE (APRESOLINE) 25 MG tablet TAKE 1 TABLET BY MOUTH IN THE  MORNING AND 1 TABLET BY MOUTH AT BEDTIME 180 tablet 3   ketotifen (ZADITOR) 0.025 % ophthalmic solution Place 1 drop into both eyes 2 (two) times daily as needed.     Omega-3 Fatty Acids (FISH OIL) 600 MG CAPS Take 2 capsules by mouth daily.     potassium chloride SA (KLOR-CON M20) 20 MEQ tablet Take 1 tablet (20 mEq total) by mouth daily. 90 tablet 1   simvastatin (ZOCOR) 10 MG tablet TAKE 1 TABLET BY MOUTH DAILY 90 tablet 0   famotidine (PEPCID) 20 MG tablet TAKE 1 TABLET BY MOUTH TWICE  DAILY 180 tablet 3   torsemide (DEMADEX) 20 MG tablet TAKE 40 MG DAILY. MAY ADD AN ADDITIONAL 20 MG ON THE DAYS OF SWELLING, WEIGHT  GAIN OR SOB. 270 tablet 1   Lidocaine 4 % PTCH Apply 1 patch topically daily as needed (pain).     Polyethyl Glycol-Propyl Glycol (SYSTANE OP) Apply 1 drop to eye daily as needed (dry eyes).     valsartan (DIOVAN) 320 MG tablet TAKE 1 TABLET BY MOUTH DAILY (Patient not taking: Reported on 10/19/2023) 90 tablet 0   No  facility-administered medications prior to visit.    Allergies  Allergen Reactions   Amlodipine Swelling    Swelling    Sulfonamide Derivatives Swelling    REACTION: Swelling of the face; "eyes swelled shut"   Myrbetriq [Mirabegron]     Facial swelling, sob   Chocolate Other (See Comments)    Sinus problems    ROS    See HPI Objective:    Physical Exam Constitutional:      General: She is not in acute distress.    Appearance: Normal appearance. She is well-developed.  HENT:     Head: Normocephalic and atraumatic.     Right Ear: External ear normal.     Left Ear: External ear normal.  Eyes:     General: No scleral icterus. Neck:     Thyroid: No thyromegaly.  Cardiovascular:     Rate and Rhythm: Normal rate and regular rhythm.     Heart sounds: Normal heart sounds. No murmur heard. Pulmonary:     Effort: Pulmonary effort is normal. No respiratory distress.     Breath sounds: Normal breath sounds. No wheezing.  Musculoskeletal:     Cervical back: Neck supple.     Right lower leg: 4+ Edema present.     Left lower leg: 4+ Edema present.  Skin:    General: Skin is warm and dry.  Neurological:     Mental Status: She is alert and oriented to person, place, and time.  Psychiatric:        Mood and Affect: Mood normal.        Behavior: Behavior normal.        Thought Content: Thought content normal.        Judgment: Judgment normal.      BP 101/84 (BP Location: Left Arm, Patient Position: Sitting, Cuff Size: Large)   Pulse (!) 54   Temp (!) 97.5 F (36.4 C) (Oral)   Resp 18   Ht 5\' 4"  (1.626 m)   Wt 239 lb (108.4 kg)   LMP 07/11/1991   SpO2 97%   BMI 41.02 kg/m  Wt Readings from Last 3 Encounters:  10/19/23 239 lb (108.4 kg)  06/18/23 220 lb (99.8 kg)  04/09/23 216 lb (98 kg)       Assessment & Plan:   Problem List Items Addressed This Visit       Unprioritized   GERD (gastroesophageal reflux disease)   Stable, advised pt to remain off of  pepcid.      Essential hypertension - Primary   BP looks good without diovan.       Relevant Medications   torsemide (DEMADEX) 20 MG tablet   Other Relevant Orders   CBC w/Diff   Comp Met (CMET)   Depression   Mood appears stable on prozac.       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       Chronic diastolic heart failure (HCC)   She appears to be holding on to more fluid based on her LE edema and 19 pound  weight gain.  Currently taking demadex 40mg  AM and 20mg  PM. Will increase to 40mg  bid.        Relevant Medications   torsemide (DEMADEX) 20 MG tablet   Atrial fibrillation (HCC)   Sounds like NSR on today's exam.  She is now on diltiazem and eliquis. I have advised her to schedule follow up with her cardiologist. Also, we will request records of her most recent hospitalization from [facility].       Relevant Medications   torsemide (DEMADEX) 20 MG tablet   Allergic rhinitis   Stable with flonase only. Has not needed to start allegra yet.       Other Visit Diagnoses       Edema, unspecified type       Relevant Medications   torsemide (DEMADEX) 20 MG tablet      48 minutes spent on today's visit. Time was spent reviewing medical record, interviewing and examining patient and forming a complex medical plan.  I have discontinued Courtney Lank Nasca "Jackie"'s Polyethyl Glycol-Propyl Glycol (SYSTANE OP), lidocaine, famotidine, and valsartan. I have also changed her torsemide. Additionally, I am having her maintain her fexofenadine, fluticasone, Fish Oil, acetaminophen, ketotifen, bisoprolol, hydrALAZINE, potassium chloride SA, gabapentin, simvastatin, FLUoxetine, Eliquis, diltiazem, and cholecalciferol.  Meds ordered this encounter  Medications   torsemide (DEMADEX) 20 MG tablet    Sig: Take 2 tablets (40 mg total) by mouth 2 (two) times daily. Take 40 mg daily. May add an additional 20 mg on the days of  swelling, weight gain or SOB.    Supervising Provider:   Danise Edge A [phone]

## 2023-10-19 NOTE — Telephone Encounter (Signed)
 Patient was evaluated today and verbal orders left on secured voice mail for [facility]. Per pcp ok to precede with PT.

## 2023-10-19 NOTE — Assessment & Plan Note (Signed)
 Sounds like NSR on today's exam.  She is now on diltiazem and eliquis. I have advised her to schedule follow up with her cardiologist. Also, we will request records of her most recent hospitalization from [facility].

## 2023-10-19 NOTE — Assessment & Plan Note (Signed)
 She appears to be holding on to more fluid based on her LE edema and 19 pound weight gain.  Currently taking demadex 40mg  AM and 20mg  PM. Will increase to 40mg  bid.

## 2023-10-19 NOTE — Assessment & Plan Note (Signed)
 Stable, advised pt to remain off of pepcid.

## 2023-10-19 NOTE — Assessment & Plan Note (Signed)
 BP looks good without diovan.

## 2023-10-19 NOTE — Assessment & Plan Note (Signed)
 Stable with flonase only. Has not needed to start allegra yet.

## 2023-10-19 NOTE — Assessment & Plan Note (Signed)
 Mood appears stable on prozac.

## 2023-10-20 ENCOUNTER — Other Ambulatory Visit: Payer: Self-pay | Admitting: Family

## 2023-10-20 LAB — COMPREHENSIVE METABOLIC PANEL WITH GFR
AG Ratio: 1.4 (calc) (ref 1.0–2.5)
ALT: 11 U/L (ref 6–29)
AST: 21 U/L (ref 10–35)
Albumin: 4 g/dL (ref 3.6–5.1)
Alkaline phosphatase (APISO): 83 U/L (ref 37–153)
BUN/Creatinine Ratio: 24 (calc) — ABNORMAL HIGH (ref 6–22)
BUN: 36 mg/dL — ABNORMAL HIGH (ref 7–25)
CO2: 25 mmol/L (ref 20–32)
Calcium: 10.5 mg/dL — ABNORMAL HIGH (ref 8.6–10.4)
Chloride: 103 mmol/L (ref 98–110)
Creat: 1.53 mg/dL — ABNORMAL HIGH (ref 0.60–0.95)
Globulin: 2.8 g/dL (ref 1.9–3.7)
Glucose, Bld: 118 mg/dL — ABNORMAL HIGH (ref 65–99)
Potassium: 4 mmol/L (ref 3.5–5.3)
Sodium: 139 mmol/L (ref 135–146)
Total Bilirubin: 0.5 mg/dL (ref 0.2–1.2)
Total Protein: 6.8 g/dL (ref 6.1–8.1)
eGFR: 34 mL/min/{1.73_m2} — ABNORMAL LOW (ref >=60)

## 2023-10-20 LAB — CBC WITH DIFFERENTIAL/PLATELET
Absolute Lymphocytes: 1613 {cells}/uL (ref 850–3900)
Absolute Monocytes: 533 {cells}/uL (ref 200–950)
Basophils Absolute: 74 {cells}/uL (ref 0–200)
Basophils Relative: 1 %
Eosinophils Absolute: 81 {cells}/uL (ref 15–500)
Eosinophils Relative: 1.1 %
HCT: 27.7 % — ABNORMAL LOW (ref 35.0–45.0)
Hemoglobin: 9.1 g/dL — ABNORMAL LOW (ref 11.7–15.5)
MCH: 32.2 pg (ref 27.0–33.0)
MCHC: 32.9 g/dL (ref 32.0–36.0)
MCV: 97.9 fL (ref 80.0–100.0)
MPV: 10.3 fL (ref 7.5–12.5)
Monocytes Relative: 7.2 %
Neutro Abs: 5099 {cells}/uL (ref 1500–7800)
Neutrophils Relative %: 68.9 %
Platelets: 231 10*3/uL (ref 140–400)
RBC: 2.83 10*6/uL — ABNORMAL LOW (ref 3.80–5.10)
RDW: 12.3 % (ref 11.0–15.0)
Total Lymphocyte: 21.8 %
WBC: 7.4 10*3/uL (ref 3.8–10.8)

## 2023-10-21 ENCOUNTER — Encounter: Payer: Self-pay | Admitting: Family

## 2023-10-23 ENCOUNTER — Inpatient Hospital Stay: Admitting: Family

## 2023-10-30 ENCOUNTER — Other Ambulatory Visit: Payer: Self-pay | Admitting: Family

## 2023-11-01 ENCOUNTER — Other Ambulatory Visit (HOSPITAL_COMMUNITY): Payer: Self-pay

## 2023-11-01 MED ORDER — ELIQUIS 2.5 MG PO TABS
2.5000 mg | ORAL_TABLET | Freq: Two times a day (BID) | ORAL | 5 refills | Status: DC
  Filled 2023-11-01 – 2023-11-07 (×2): qty 60, 30d supply, fill #0
  Filled 2023-12-06: qty 60, 30d supply, fill #1
  Filled 2024-01-02: qty 60, 30d supply, fill #2
  Filled 2024-02-01: qty 60, 30d supply, fill #3
  Filled 2024-03-07: qty 60, 30d supply, fill #4
  Filled 2024-04-04: qty 60, 30d supply, fill #5

## 2023-11-01 MED ORDER — VITAMIN D 25 MCG (1000 UNIT) PO TABS
1000.0000 [IU] | ORAL_TABLET | Freq: Every day | ORAL | 1 refills | Status: DC
  Filled 2023-11-01: qty 90, 90d supply, fill #0
  Filled 2024-02-01 – 2024-02-02 (×2): qty 90, 90d supply, fill #1

## 2023-11-01 MED ORDER — DILTIAZEM HCL ER COATED BEADS 120 MG PO CP24
120.0000 mg | ORAL_CAPSULE | Freq: Every morning | ORAL | 1 refills | Status: DC
  Filled 2023-11-01 – 2023-11-07 (×2): qty 90, 90d supply, fill #0
  Filled 2024-02-01: qty 90, 90d supply, fill #1

## 2023-11-02 ENCOUNTER — Other Ambulatory Visit: Payer: Self-pay

## 2023-11-02 ENCOUNTER — Other Ambulatory Visit (HOSPITAL_COMMUNITY): Payer: Self-pay

## 2023-11-05 ENCOUNTER — Other Ambulatory Visit: Payer: Self-pay

## 2023-11-06 ENCOUNTER — Other Ambulatory Visit: Payer: Self-pay

## 2023-11-07 ENCOUNTER — Other Ambulatory Visit: Payer: Self-pay

## 2023-11-07 ENCOUNTER — Encounter: Payer: Self-pay | Admitting: Pharmacist

## 2023-11-07 ENCOUNTER — Encounter (HOSPITAL_COMMUNITY): Payer: Self-pay

## 2023-11-07 ENCOUNTER — Other Ambulatory Visit (HOSPITAL_COMMUNITY): Payer: Self-pay

## 2023-11-08 ENCOUNTER — Other Ambulatory Visit (HOSPITAL_COMMUNITY): Payer: Self-pay

## 2023-11-08 ENCOUNTER — Other Ambulatory Visit (HOSPITAL_BASED_OUTPATIENT_CLINIC_OR_DEPARTMENT_OTHER): Payer: Self-pay

## 2023-11-08 ENCOUNTER — Other Ambulatory Visit: Payer: Self-pay

## 2023-11-08 DIAGNOSIS — F3341 Major depressive disorder, recurrent, in partial remission: Secondary | ICD-10-CM

## 2023-11-08 DIAGNOSIS — N184 Chronic kidney disease, stage 4 (severe): Secondary | ICD-10-CM | POA: Diagnosis not present

## 2023-11-08 DIAGNOSIS — A419 Sepsis, unspecified organism: Secondary | ICD-10-CM | POA: Diagnosis not present

## 2023-11-08 DIAGNOSIS — I509 Heart failure, unspecified: Secondary | ICD-10-CM | POA: Diagnosis not present

## 2023-11-08 DIAGNOSIS — M51369 Other intervertebral disc degeneration, lumbar region without mention of lumbar back pain or lower extremity pain: Secondary | ICD-10-CM

## 2023-11-08 DIAGNOSIS — D631 Anemia in chronic kidney disease: Secondary | ICD-10-CM

## 2023-11-08 DIAGNOSIS — G9341 Metabolic encephalopathy: Secondary | ICD-10-CM

## 2023-11-08 DIAGNOSIS — S8261XG Displaced fracture of lateral malleolus of right fibula, subsequent encounter for closed fracture with delayed healing: Secondary | ICD-10-CM

## 2023-11-08 DIAGNOSIS — S8262XG Displaced fracture of lateral malleolus of left fibula, subsequent encounter for closed fracture with delayed healing: Secondary | ICD-10-CM

## 2023-11-08 DIAGNOSIS — I4891 Unspecified atrial fibrillation: Secondary | ICD-10-CM

## 2023-11-08 DIAGNOSIS — I130 Hypertensive heart and chronic kidney disease with heart failure and stage 1 through stage 4 chronic kidney disease, or unspecified chronic kidney disease: Secondary | ICD-10-CM | POA: Diagnosis not present

## 2023-11-08 DIAGNOSIS — M6282 Rhabdomyolysis: Secondary | ICD-10-CM

## 2023-11-09 ENCOUNTER — Telehealth: Payer: Self-pay

## 2023-11-09 NOTE — Telephone Encounter (Signed)
 Verbal order given to Moira, ok per pcp to go ahead with OT

## 2023-11-12 ENCOUNTER — Other Ambulatory Visit: Payer: Self-pay

## 2023-11-13 ENCOUNTER — Telehealth: Payer: Self-pay | Admitting: Neurology

## 2023-11-13 NOTE — Telephone Encounter (Signed)
 Copied from CRM [id number]. Topic: Clinical - Medical Advice >> Nov 13, 2023  3:20 PM Allyne Areola wrote: Reason for CRM: [facility] is calling to inform left lower extremety is laserated and looks like it can open up into a sore. Currently she is on torsemide  (DEMADEX ) 20 MG tablet and feels like she is not urinating and weight has stayed the same at 225 lbs. She has not been elevating her leg even though she has been given instructions  multiple times to elevate her leg throughout the day.

## 2023-11-14 ENCOUNTER — Telehealth: Payer: Self-pay

## 2023-11-14 NOTE — Telephone Encounter (Signed)
 Provider will call patient today to address

## 2023-11-14 NOTE — Telephone Encounter (Signed)
 Wt Readings from Last 3 Encounters:  10/19/23 239 lb (108.4 kg)  06/18/23 220 lb (99.8 kg)  04/09/23 216 lb (98 kg)   I spoke with patient, she says that there is a small crack in the skin <1 inch.  Denies any surrounding erythema, but some weeping noted.  She is urinating without difficulty and is working to elevated her legs during the day.  Reports weight today is 226.  Compared to weight last visit weight looks good.  Please advise nurse to dress the skin crack with bandage and continue to weigh patient.  Call if weight >230 or if redness surrounding skin crack occurs or foul smelling drainage.

## 2023-11-14 NOTE — Telephone Encounter (Signed)
 Copied from CRM [id number]. Topic: Clinical - Home Health Verbal Orders >> Nov 14, 2023  1:04 PM Ivette P wrote: Caller/Agency: [facility]  Callback Number: [id number] Service Requested: Skilled Nursing Frequency: 1 week for 1 time Any new concerns about the patient? Yes, having some  weeping in legs has had Dema for abit legs started weeping yesterday and has not stopped.    Fax number to order for a nurse to go look at legs: [phone]   * If would like to take off  torsemide  (DEMADEX ) 20 MG tablet to prevent weeping please call pt directly.

## 2023-11-14 NOTE — Telephone Encounter (Signed)
 Spoke to nurse and instructions faxed to the agency @ [phone] at her request. Also indicated on instructions ok for nurse to visit for evaluation.  Attn: Danna Kinlaw

## 2023-11-27 ENCOUNTER — Ambulatory Visit (INDEPENDENT_AMBULATORY_CARE_PROVIDER_SITE_OTHER): Admitting: Family

## 2023-11-27 ENCOUNTER — Other Ambulatory Visit: Payer: Self-pay | Admitting: Family

## 2023-11-27 VITALS — BP 115/58 | HR 58 | Temp 97.9°F | Resp 16 | Ht 64.0 in | Wt 229.0 lb

## 2023-11-27 DIAGNOSIS — R739 Hyperglycemia, unspecified: Secondary | ICD-10-CM | POA: Diagnosis not present

## 2023-11-27 DIAGNOSIS — I4891 Unspecified atrial fibrillation: Secondary | ICD-10-CM

## 2023-11-27 DIAGNOSIS — L03119 Cellulitis of unspecified part of limb: Secondary | ICD-10-CM | POA: Diagnosis not present

## 2023-11-27 DIAGNOSIS — G4733 Obstructive sleep apnea (adult) (pediatric): Secondary | ICD-10-CM

## 2023-11-27 DIAGNOSIS — E785 Hyperlipidemia, unspecified: Secondary | ICD-10-CM | POA: Diagnosis not present

## 2023-11-27 DIAGNOSIS — N3281 Overactive bladder: Secondary | ICD-10-CM

## 2023-11-27 DIAGNOSIS — D649 Anemia, unspecified: Secondary | ICD-10-CM | POA: Diagnosis not present

## 2023-11-27 DIAGNOSIS — J302 Other seasonal allergic rhinitis: Secondary | ICD-10-CM

## 2023-11-27 DIAGNOSIS — N184 Chronic kidney disease, stage 4 (severe): Secondary | ICD-10-CM

## 2023-11-27 DIAGNOSIS — E559 Vitamin D deficiency, unspecified: Secondary | ICD-10-CM | POA: Diagnosis not present

## 2023-11-27 DIAGNOSIS — F32A Depression, unspecified: Secondary | ICD-10-CM

## 2023-11-27 DIAGNOSIS — I10 Essential (primary) hypertension: Secondary | ICD-10-CM | POA: Diagnosis not present

## 2023-11-27 DIAGNOSIS — Z780 Asymptomatic menopausal state: Secondary | ICD-10-CM

## 2023-11-27 LAB — VITAMIN D 25 HYDROXY (VIT D DEFICIENCY, FRACTURES): VITD: 48.52 ng/mL (ref 30.00–100.00)

## 2023-11-27 LAB — B12 AND FOLATE PANEL
Folate: 9.9 ng/mL (ref >5.9)
Vitamin B-12: 268 pg/mL (ref 211–911)

## 2023-11-27 LAB — HEMOGLOBIN A1C: Hgb A1c MFr Bld: 5.4 % (ref 4.6–6.5)

## 2023-11-27 MED ORDER — CEPHALEXIN 500 MG PO CAPS: 500.0000 mg | ORAL_CAPSULE | Freq: Three times a day (TID) | ORAL | 0 refills | Status: DC

## 2023-11-27 NOTE — Assessment & Plan Note (Signed)
 Rate stable on diltiazem , continues Eliquis .

## 2023-11-27 NOTE — Assessment & Plan Note (Signed)
 Stable/improved on allegra.

## 2023-11-27 NOTE — Assessment & Plan Note (Signed)
 Follows with Dr. Emmitt Harp at [facility].

## 2023-11-27 NOTE — Assessment & Plan Note (Signed)
 Lab Results  Component Value Date   WBC 7.4 10/19/2023   HGB 9.1 (L) 10/19/2023   HCT 27.7 (L) 10/19/2023   MCV 97.9 10/19/2023   PLT 231 10/19/2023

## 2023-11-27 NOTE — Assessment & Plan Note (Addendum)
 Lab Results  Component Value Date   HGBA1C 5.8 12/05/2022   Update A1c.

## 2023-11-27 NOTE — Assessment & Plan Note (Signed)
 BP stable on hydralazine , diltiazem  and zebeta .  Continue same.

## 2023-11-27 NOTE — Assessment & Plan Note (Addendum)
Stable on prozac, continue same. 

## 2023-11-27 NOTE — Assessment & Plan Note (Signed)
 Lab Results  Component Value Date   CHOL 137 12/05/2022   HDL 67.00 12/05/2022   LDLCALC 51 12/05/2022   LDLDIRECT 110.4 12/13/2006   TRIG 95.0 12/05/2022   CHOLHDL 2 12/05/2022   Stable on statin- will update today.

## 2023-11-27 NOTE — Assessment & Plan Note (Signed)
 Stable; unchanged

## 2023-11-27 NOTE — Progress Notes (Signed)
 j  Subjective:     Patient ID: [name], female    DOB: 1940-07-31, 83 y.o.   MRN: [id number]  Chief Complaint  Patient presents with   Hypertension    Here for follow up   Leg Swelling    Here for follow up    HPI  Discussed the use of AI scribe software for clinical note transcription with the patient, who gave verbal consent to proceed.  History of Present Illness   [name] "Fredrik Jensen" is an 83 year old female who presents for follow-up regarding skin issues and medication management. She is accompanied by her son.  Her skin condition, previously noted as weeping skin issue of the LE reported by her home health nurse is reportedly stable. She is maintained on Demadex  40 mg twice daily. Mood is stable with fluoxetine . Seasonal allergies are controlled with Allegra. She takes Eliquis  without side effects and Cardizem  CD for atrial fibrillation, with no issues. Chronic kidney disease and overactive bladder are stable and managed by specialists.     Health Maintenance Due  Topic Date Due   COVID-19 Vaccine (6 - 2024-25 season) 03/11/2023   Medicare Annual Wellness (AWV)  12/05/2023    Past Medical History:  Diagnosis Date   Allergic rhinitis    Allergy    Ankle fracture    Left, Mortise Widening   Anxiety 2020   Arthritis    "knuckles" (06/17/2012)   Borderline diabetes mellitus 03/06/2011   Breast cancer (HCC)    "right" (06/17/2012)   Cataracts, bilateral    Depression 2020   Displaced fracture of distal end of left fibula 07/30/2017   Dyspnea    with exertion   Exertional dyspnea    GERD (gastroesophageal reflux disease)    Hypertension    Iron deficiency anemia    "hematologist watches it" (06/17/2012)   Obesity    OSA treated with BiPAP 03/08/2016   Osteoarthritis    severe right hip osteoarthritis-s/p hip replacement   Osteopenia 11/26/2009   Thyroid  disorder    "sees endocrinologist yearly" (06/17/2012)    Past Surgical History:   Procedure Laterality Date   ANKLE FRACTURE SURGERY Right 01/01/2014   Has pins. Procedure performed at [facility]   BREAST BIOPSY  ` 1992   "bilaterlly" (06/17/2012)   COLONOSCOPY W/ POLYPECTOMY     EYE SURGERY  2011   cataract removal both eyes   FRACTURE SURGERY     HIP CLOSED REDUCTION  07/26/2012   Procedure: CLOSED MANIPULATION HIP;  Surgeon: Derald Flattery, MD;  Location: [facility];  Service: Orthopedics;  Laterality: Right;   JOINT REPLACEMENT  2007   LUMBAR LAMINECTOMY  10/26/2017   LUMBAR LAMINECTOMY/DECOMPRESSION MICRODISCECTOMY Bilateral 10/25/2017   Procedure: LUMBAR 2-3, LUMBAR 3-4 DECOMPRESSION TIME REQUESTED 4.5 HOURS;  Surgeon: Virl Grimes, MD;  Location: [facility];  Service: Orthopedics;  Laterality: Bilateral;   MASTECTOMY  1993?   bilateral mastectomy   ORIF ANKLE FRACTURE Left 07/30/2017   Procedure: LEFT OPEN REDUCTION INTERNAL FIXATION (ORIF) ANKLE FRACTURE WITH SYDESMOTIC FIXATION;  Surgeon: Neil Balls, MD;  Location: [facility] OR;  Service: Orthopedics;  Laterality: Left;   TONSILLECTOMY  1949   TOTAL HIP ARTHROPLASTY  04/2006   right hip    TOTAL HIP REVISION  06/17/2012   "right" (06/17/2012)   TOTAL HIP REVISION  06/17/2012   Procedure: TOTAL HIP REVISION;  Surgeon: Ilean Mall, MD;  Location: [facility] OR;  Service: Orthopedics;  Laterality: Right;    Family History  Problem Relation Age of Onset   Heart failure Father        deceased age 60   Diabetes Father    Alcohol abuse Father    Breast cancer Mother        deceased at age 57 secondary to breast cancer   Liver disease Sister        Liver failure--deceased   Dementia Maternal Grandmother    Colon cancer Neg Hx    Esophageal cancer Neg Hx    Rectal cancer Neg Hx    Stomach cancer Neg Hx     Social History   Socioeconomic History   Marital status: Widowed    Spouse name: Not on file   Number of children: Not on file   Years of education: Not on file   Highest education level:  Bachelor's degree (e.g., BA, AB, BS)  Occupational History   Not on file  Tobacco Use   Smoking status: Former    Current packs/day: 0.00    Average packs/day: 0.5 packs/day for 10.0 years (5.0 ttl pk-yrs)    Types: Cigarettes    Start date: 07/14/1964    Quit date: 07/14/1974    Years since quitting: 49.4   Smokeless tobacco: Never   Tobacco comments:    quit in 1976 smoked for approx. 10 years  Vaping Use   Vaping status: Never Used  Substance and Sexual Activity   Alcohol use: Yes    Alcohol/week: 4.0 standard drinks of alcohol    Types: 4 Glasses of wine per week    Comment: Glass of wine once in awhile.   Drug use: No   Sexual activity: Never  Other Topics Concern   Not on file  Social History Narrative   Retired   The patient is married and has one child and two grandchildren that live in the area.     She drinks wine with dinner.  She quit smoking in 1976, smoked for   approximately 10 years.       Moved to [place] from [place], [place]         Social Drivers of Health   Financial Resource Strain: Low Risk  (08/15/2023)   Overall Financial Resource Strain (CARDIA)    Difficulty of Paying Living Expenses: Not hard at all  Food Insecurity: No Food Insecurity (08/15/2023)   Hunger Vital Sign    Worried About Running Out of Food in the Last Year: Never true    Ran Out of Food in the Last Year: Never true  Transportation Needs: Unmet Transportation Needs (08/15/2023)   PRAPARE - Administrator, Civil Service (Medical): Yes    Lack of Transportation (Non-Medical): No  Physical Activity: Inactive (08/15/2023)   Exercise Vital Sign    Days of Exercise per Week: 0 days    Minutes of Exercise per Session: 0 min  Stress: No Stress Concern Present (08/15/2023)   [employer] of Occupational Health - Occupational Stress Questionnaire    Feeling of Stress : Only a little  Social Connections: Unknown (08/15/2023)   Social Connection and Isolation Panel [NHANES]     Frequency of Communication with Friends and Family: More than three times a week    Frequency of Social Gatherings with Friends and Family: More than three times a week    Attends Religious Services: Patient declined    Database administrator or Organizations: No    Attends Banker Meetings: Never    Marital Status: Widowed  Intimate Partner Violence: Not At Risk (11/22/2021)   Humiliation, Afraid, Rape, and Kick questionnaire    Fear of Current or Ex-Partner: No    Emotionally Abused: No    Physically Abused: No    Sexually Abused: No    Outpatient Medications Prior to Visit  Medication Sig Dispense Refill   acetaminophen  (TYLENOL ) 500 MG tablet Take 1,000 mg by mouth in the morning, at noon, and at bedtime. Take with Gabapetin     bisoprolol  (ZEBETA ) 10 MG tablet TAKE 2 TABLETS BY MOUTH DAILY 180 tablet 3   cholecalciferol  (VITAMIN D3) 25 MCG (1000 UNIT) tablet Take 1 tablet (1,000 Units total) by mouth daily. 90 tablet 1   diltiazem  (CARDIZEM  CD) 120 MG 24 hr capsule Take 1 capsule (120 mg total) by mouth every morning. 90 capsule 1   ELIQUIS  2.5 MG TABS tablet Take 1 tablet (2.5 mg total) by mouth 2 (two) times daily. 60 tablet 5   fexofenadine (ALLEGRA) 180 MG tablet Take 180 mg by mouth daily as needed. For seasonal allergies     FLUoxetine  (PROZAC ) 10 MG capsule TAKE 1 CAPSULE BY MOUTH AT  BEDTIME 90 capsule 3   fluticasone  (FLONASE ) 50 MCG/ACT nasal spray USE 1 SPRAY IN EACH NOSTRIL EVERY DAY (Patient taking differently: Place 1 spray into both nostrils daily as needed.) 32 g 1   hydrALAZINE  (APRESOLINE ) 25 MG tablet TAKE 1 TABLET BY MOUTH IN THE  MORNING AND 1 TABLET BY MOUTH AT BEDTIME 180 tablet 3   ketotifen (ZADITOR) 0.025 % ophthalmic solution Place 1 drop into both eyes 2 (two) times daily as needed.     Omega-3 Fatty Acids (FISH OIL) 600 MG CAPS Take 2 capsules by mouth daily.     potassium chloride  SA (KLOR-CON  M20) 20 MEQ tablet Take 1 tablet (20 mEq total) by  mouth daily. 90 tablet 1   simvastatin  (ZOCOR ) 10 MG tablet TAKE 1 TABLET BY MOUTH DAILY 90 tablet 1   torsemide  (DEMADEX ) 20 MG tablet Take 2 tablets (40 mg total) by mouth 2 (two) times daily. Take 40 mg daily. May add an additional 20 mg on the days of swelling, weight gain or SOB.     gabapentin  (NEURONTIN ) 100 MG capsule TAKE 1 CAPSULE BY MOUTH 3 TIMES  DAILY 270 capsule 1   valsartan  (DIOVAN ) 320 MG tablet TAKE 1 TABLET BY MOUTH DAILY 90 tablet 1   No facility-administered medications prior to visit.    Allergies  Allergen Reactions   Amlodipine  Swelling    Swelling    Sulfonamide Derivatives Swelling    REACTION: Swelling of the face; "eyes swelled shut"   Myrbetriq  [Mirabegron ]     Facial swelling, sob   Chocolate Other (See Comments)    Sinus problems    ROS See HPI    Objective:     Physical Exam Constitutional:      Appearance: She is well-developed.  Cardiovascular:     Rate and Rhythm: Normal rate and regular rhythm.     Heart sounds: Normal heart sounds. No murmur heard. Pulmonary:     Effort: Pulmonary effort is normal. No respiratory distress.     Breath sounds: Normal breath sounds. No wheezing.  Musculoskeletal:     Comments: Chronic LE edema 4/4- is at baseline  Skin:    Comments: Bilateral LE erythema up to mid shin, some skin crusting at area of previous skin crack.  No drainage noted  Psychiatric:        Behavior:  Behavior normal.        Thought Content: Thought content normal.        Judgment: Judgment normal.      BP (!) 115/58 (BP Location: Left Arm, Patient Position: Sitting, Cuff Size: Large)   Pulse (!) 58   Temp 97.9 F (36.6 C) (Oral)   Resp 16   Ht 5\' 4"  (1.626 m)   Wt 229 lb (103.9 kg)   LMP 07/11/1991   SpO2 98%   BMI 39.31 kg/m  Wt Readings from Last 3 Encounters:  11/27/23 229 lb (103.9 kg)  10/19/23 239 lb (108.4 kg)  06/18/23 220 lb (99.8 kg)       Assessment & Plan:   Problem List Items Addressed This Visit        Unprioritized   Seasonal allergies   Stable/improved on allegra.       Overactive bladder   Stable unchanged.       OSA (obstructive sleep apnea)   Stable on cpap. Continue same.        Hyperlipemia   Lab Results  Component Value Date   CHOL 137 12/05/2022   HDL 67.00 12/05/2022   LDLCALC 51 12/05/2022   LDLDIRECT 110.4 12/13/2006   TRIG 95.0 12/05/2022   CHOLHDL 2 12/05/2022   Stable on statin- will update today.      Relevant Orders   Lipid panel (Completed)   Hyperglycemia - Primary   Lab Results  Component Value Date   HGBA1C 5.8 12/05/2022   Update A1c.       Relevant Orders   HgB A1c (Completed)   Essential hypertension   BP stable on hydralazine , diltiazem  and zebeta .  Continue same.       Depression   Stable on prozac , continue same.       CKD (chronic kidney disease) stage 4, GFR 15-29 ml/min (HCC)   Follows with Dr. Emmitt Harp at [facility]      Cellulitis of lower extremity   New. Will rx with keflex . She will let me know if the redness worsens or if it does not improve over the next week.       Relevant Medications   cephALEXin  (KEFLEX ) 500 MG capsule   Atrial fibrillation (HCC)   Rate stable on diltiazem , continues Eliquis .       ANEMIA   Lab Results  Component Value Date   WBC 7.4 10/19/2023   HGB 9.1 (L) 10/19/2023   HCT 27.7 (L) 10/19/2023   MCV 97.9 10/19/2023   PLT 231 10/19/2023         Relevant Orders   B12 and Folate Panel (Completed)   Fecal occult blood, imunochemical([facility])   Other Visit Diagnoses       Postmenopausal estrogen deficiency       Relevant Orders   DG Bone Density     Vitamin D  deficiency       Relevant Orders   VITAMIN D  25 Hydroxy (Vit-D Deficiency, Fractures) (Completed)       I have discontinued [name] "[name]"'s valsartan . I am also having her start on cephALEXin . Additionally, I am having her maintain her fexofenadine, fluticasone , Fish  Oil, acetaminophen , ketotifen, bisoprolol , hydrALAZINE , potassium chloride  SA, FLUoxetine , torsemide , simvastatin , Eliquis , diltiazem , and cholecalciferol .  Meds ordered this encounter  Medications   cephALEXin  (KEFLEX ) 500 MG capsule    Sig: Take 1 capsule (500 mg total) by mouth 3 (three) times daily.    Dispense:  21 capsule    Refill:  0    Supervising Provider:   Randie Bustle [phone]

## 2023-11-27 NOTE — Assessment & Plan Note (Signed)
Stable on cpap.  Continue same. 

## 2023-11-28 ENCOUNTER — Telehealth: Payer: Self-pay | Admitting: Family

## 2023-11-28 ENCOUNTER — Other Ambulatory Visit (INDEPENDENT_AMBULATORY_CARE_PROVIDER_SITE_OTHER)

## 2023-11-28 ENCOUNTER — Encounter: Payer: Self-pay | Admitting: Internal Medicine

## 2023-11-28 DIAGNOSIS — D649 Anemia, unspecified: Secondary | ICD-10-CM | POA: Diagnosis not present

## 2023-11-28 LAB — LIPID PANEL
Cholesterol: 149 mg/dL (ref 0–200)
HDL: 71.7 mg/dL (ref >39.00)
LDL Cholesterol: 63 mg/dL (ref 0–99)
NonHDL: 76.98
Total CHOL/HDL Ratio: 2
Triglycerides: 69 mg/dL (ref 0.0–149.0)
VLDL: 13.8 mg/dL (ref 0.0–40.0)

## 2023-11-28 MED ORDER — VITAMIN B-12 1000 MCG PO TABS
1000.0000 ug | ORAL_TABLET | Freq: Every day | ORAL | Status: AC
Start: 2023-11-28 — End: ?

## 2023-11-28 NOTE — Assessment & Plan Note (Signed)
 New. Will rx with keflex . She will let me know if the redness worsens or if it does not improve over the next week.

## 2023-11-28 NOTE — Patient Instructions (Signed)
 VISIT SUMMARY:  Today, we reviewed your skin issues and medication management. Your skin condition is stable, and your mood, seasonal allergies, and other chronic conditions are well-managed with your current medications.  YOUR PLAN:  ATRIAL FIBRILLATION: Your atrial fibrillation is well-managed. -Continue taking Eliquis  and Cardizem  CD as prescribed.  CHRONIC KIDNEY DISEASE: Your chronic kidney disease is being managed by Dr. Britta Candy. -Continue following up with Dr. Britta C[facility] at [facility].  OVERACTIVE BLADDER: Your overactive bladder is well-controlled. -Continue with your current management plan.  DEPRESSION: Your mood is stable. -Continue taking fluoxetine  as prescribed.  SEASONAL ALLERGIES: Your seasonal allergies are well-controlled. -Continue taking Allegra as needed.

## 2023-11-28 NOTE — Telephone Encounter (Signed)
 Please advise pt that her b12 is on the low side.Has she been taking b12 1000 mcg PO daily?  If so, then I would recommend that we change her to 1000 mcg IM monthly injections.  If she is not taking regularly. Please restart.

## 2023-11-29 ENCOUNTER — Ambulatory Visit: Payer: Self-pay | Admitting: Family

## 2023-11-29 LAB — FECAL OCCULT BLOOD, IMMUNOCHEMICAL: Fecal Occult Bld: NEGATIVE

## 2023-11-29 NOTE — Telephone Encounter (Signed)
 Called patient but no answer, left voice mail for patient to call back.

## 2023-12-06 ENCOUNTER — Other Ambulatory Visit (HOSPITAL_COMMUNITY): Payer: Self-pay

## 2023-12-06 ENCOUNTER — Ambulatory Visit

## 2023-12-06 VITALS — BP 115/58 | Ht 64.0 in | Wt 225.0 lb

## 2023-12-06 DIAGNOSIS — Z2821 Immunization not carried out because of patient refusal: Secondary | ICD-10-CM | POA: Diagnosis not present

## 2023-12-06 DIAGNOSIS — Z0000 Encounter for general adult medical examination without abnormal findings: Secondary | ICD-10-CM | POA: Diagnosis not present

## 2023-12-06 NOTE — Patient Instructions (Signed)
 [name] , Thank you for taking time out of your busy schedule to complete your Annual Wellness Visit with me. I enjoyed our conversation and look forward to speaking with you again next year. I, as well as your care team,  appreciate your ongoing commitment to your health goals. Please review the following plan we discussed and let me know if I can assist you in the future. Your Game plan/ To Do List    Referrals: If you haven't heard from the office you've been referred to, please reach out to them at the phone provided.   Follow up Visits: Next Medicare AWV with our clinical staff: 12/10/2024   Have you seen your provider in the last 6 months (3 months if uncontrolled diabetes)? No Next Office Visit with your provider: 02/26/2024  Clinician Recommendations:  Aim for 30 minutes of exercise or brisk walking, 6-8 glasses of water, and 5 servings of fruits and vegetables each day.       This is a list of the screening recommended for you and due dates:  Health Maintenance  Topic Date Due   COVID-19 Vaccine (6 - 2024-25 season) 03/11/2023   Flu Shot  02/08/2024   Medicare Annual Wellness Visit  12/05/2024   DTaP/Tdap/Td vaccine (3 - Td or Tdap) 07/28/2030   Pneumonia Vaccine  Completed   DEXA scan (bone density measurement)  Completed   Zoster (Shingles) Vaccine  Completed   HPV Vaccine  Aged Out   Meningitis B Vaccine  Aged Out   Colon Cancer Screening  Discontinued   Hepatitis C Screening  Discontinued    Advanced directives: (Declined) Advance directive discussed with you today. Even though you declined this today, please call our office should you change your mind, and we can give you the proper paperwork for you to fill out. Advance Care Planning is important because it:  [x]  Makes sure you receive the medical care that is consistent with your values, goals, and preferences  [x]  It provides guidance to your family and loved ones and reduces their decisional burden about  whether or not they are making the right decisions based on your wishes.  Follow the link provided in your after visit summary or read over the paperwork we have mailed to you to help you started getting your Advance Directives in place. If you need assistance in completing these, please reach out to us  so that we can help you!  See attachments for Preventive Care and Fall Prevention Tips.

## 2023-12-06 NOTE — Progress Notes (Signed)
 Because this visit was a virtual/telehealth visit,  certain criteria was not obtained, such a blood pressure, CBG if applicable, and timed get up and go. Any medications not marked as "taking" were not mentioned during the medication reconciliation part of the visit. Any vitals not documented were not able to be obtained due to this being a telehealth visit or patient was unable to self-report a recent blood pressure reading due to a lack of equipment at home via telehealth. Vitals that have been documented are verbally provided by the patient.   This visit was performed by a medical professional under my direct supervision. I was immediately available for consultation/collaboration. I have reviewed and agree with the Annual Wellness Visit documentation.  Subjective:   [name] is a 83 y.o. who presents for a Medicare Wellness preventive visit.  As a reminder, Annual Wellness Visits don't include a physical exam, and some assessments may be limited, especially if this visit is performed virtually. We may recommend an in-person follow-up visit with your provider if needed.  Visit Complete: Virtual I connected with  [name] on 12/06/23 by a audio enabled telemedicine application and verified that I am speaking with the correct person using two identifiers.  Patient Location: Home  Provider Location: Home Office  I discussed the limitations of evaluation and management by telemedicine. The patient expressed understanding and agreed to proceed.  Vital Signs: Because this visit was a virtual/telehealth visit, some criteria may be missing or patient reported. Any vitals not documented were not able to be obtained and vitals that have been documented are patient reported.  VideoDeclined- This patient declined Librarian, academic. Therefore the visit was completed with audio only.  Persons Participating in Visit: Patient.  AWV Questionnaire: No: Patient  Medicare AWV questionnaire was not completed prior to this visit.  Cardiac Risk Factors include: advanced age (>75men, >61 women);obesity (BMI >30kg/m2);Other (see comment), Risk factor comments: afib     Objective:     Today's Vitals   12/06/23 1142  BP: (!) 115/58  Weight: 225 lb (102.1 kg)  Height: 5\' 4"  (1.626 m)   Body mass index is 38.62 kg/m.     12/06/2023   11:41 AM 12/05/2022    3:02 PM 11/22/2021   11:28 AM 05/01/2019   11:11 AM 03/19/2018    1:20 PM 10/18/2017    1:25 PM 07/30/2017    9:52 AM  Advanced Directives  Does Patient Have a Medical Advance Directive? Yes Yes Yes Yes Yes Yes Yes  Type of Estate agent of [place];Living will Healthcare Power of [place];Living will Healthcare Power of [place];Living will;Out of facility DNR (pink MOST or yellow form) Healthcare Power of [place];Living will Healthcare Power of [place];Living will Healthcare Power of [place];Living will Living will  Does patient want to make changes to medical advance directive? No - Patient declined  No - Patient declined No - Patient declined   No - Patient declined  Copy of Healthcare Power of Attorney in Chart? No - copy requested Yes - validated most recent copy scanned in chart (See row information) Yes - validated most recent copy scanned in chart (See row information) Yes - validated most recent copy scanned in chart (See row information) Yes Yes     Current Medications (verified) Outpatient Encounter Medications as of 12/06/2023  Medication Sig   acetaminophen  (TYLENOL ) 500 MG tablet Take 1,000 mg by mouth in the morning, at noon, and at bedtime. Take with Gabapetin   bisoprolol  (  ZEBETA ) 10 MG tablet TAKE 2 TABLETS BY MOUTH DAILY   cephALEXin  (KEFLEX ) 500 MG capsule Take 1 capsule (500 mg total) by mouth 3 (three) times daily.   cholecalciferol  (VITAMIN D3) 25 MCG (1000 UNIT) tablet Take 1 tablet (1,000 Units total) by mouth daily.   cyanocobalamin  (VITAMIN B12)  1000 MCG tablet Take 1 tablet (1,000 mcg total) by mouth daily.   diltiazem  (CARDIZEM  CD) 120 MG 24 hr capsule Take 1 capsule (120 mg total) by mouth every morning.   ELIQUIS  2.5 MG TABS tablet Take 1 tablet (2.5 mg total) by mouth 2 (two) times daily.   fexofenadine (ALLEGRA) 180 MG tablet Take 180 mg by mouth daily as needed. For seasonal allergies   FLUoxetine  (PROZAC ) 10 MG capsule TAKE 1 CAPSULE BY MOUTH AT  BEDTIME   fluticasone  (FLONASE ) 50 MCG/ACT nasal spray USE 1 SPRAY IN EACH NOSTRIL EVERY DAY (Patient taking differently: Place 1 spray into both nostrils daily as needed.)   gabapentin  (NEURONTIN ) 100 MG capsule Take 1 capsule (100 mg total) by mouth 3 (three) times daily.   hydrALAZINE  (APRESOLINE ) 25 MG tablet TAKE 1 TABLET BY MOUTH IN THE  MORNING AND 1 TABLET BY MOUTH AT BEDTIME   ketotifen (ZADITOR) 0.025 % ophthalmic solution Place 1 drop into both eyes 2 (two) times daily as needed.   Omega-3 Fatty Acids (FISH OIL) 600 MG CAPS Take 2 capsules by mouth daily.   potassium chloride  SA (KLOR-CON  M20) 20 MEQ tablet Take 1 tablet (20 mEq total) by mouth daily.   simvastatin  (ZOCOR ) 10 MG tablet TAKE 1 TABLET BY MOUTH DAILY   torsemide  (DEMADEX ) 20 MG tablet Take 2 tablets (40 mg total) by mouth 2 (two) times daily. Take 40 mg daily. May add an additional 20 mg on the days of swelling, weight gain or SOB.   No facility-administered encounter medications on file as of 12/06/2023.    Allergies (verified) Amlodipine , Sulfonamide derivatives, Myrbetriq  [place] ], and Chocolate   History: Past Medical History:  Diagnosis Date   Allergic rhinitis    Allergy    Ankle fracture    Left, Mortise [place]   Anxiety 2020   Arthritis    "knuckles" (06/17/2012)   Borderline diabetes mellitus 03/06/2011   Breast cancer (HCC)    "right" (06/17/2012)   Cataracts, bilateral    Depression 2020   Displaced fracture of distal end of left fibula 07/30/2017   Dyspnea    with exertion    Exertional dyspnea    GERD (gastroesophageal reflux disease)    Hypertension    Iron deficiency anemia    "hematologist watches it" (06/17/2012)   Obesity    OSA treated with BiPAP 03/08/2016   Osteoarthritis    severe right hip osteoarthritis-s/p hip replacement   Osteopenia 11/26/2009   Thyroid  disorder    "sees endocrinologist yearly" (06/17/2012)   Past Surgical History:  Procedure Laterality Date   ANKLE FRACTURE SURGERY Right 01/01/2014   Has pins. Procedure performed at [facility]   BREAST BIOPSY  ` 1992   "bilaterlly" (06/17/2012)   COLONOSCOPY W/ POLYPECTOMY     EYE SURGERY  2011   cataract removal both eyes   FRACTURE SURGERY     HIP CLOSED REDUCTION  07/26/2012   Procedure: CLOSED MANIPULATION HIP;  Surgeon: Derald Flattery, MD;  Location: [facility];  Service: Orthopedics;  Laterality: Right;   JOINT REPLACEMENT  2007   LUMBAR LAMINECTOMY  10/26/2017   LUMBAR LAMINECTOMY/DECOMPRESSION MICRODISCECTOMY Bilateral 10/25/2017   Procedure:  LUMBAR 2-3, LUMBAR 3-4 DECOMPRESSION TIME REQUESTED 4.5 HOURS;  Surgeon: Virl Grimes, MD;  Location: [facility] OR;  Service: Orthopedics;  Laterality: Bilateral;   MASTECTOMY  1993?   bilateral mastectomy   ORIF ANKLE FRACTURE Left 07/30/2017   Procedure: LEFT OPEN REDUCTION INTERNAL FIXATION (ORIF) ANKLE FRACTURE WITH SYDESMOTIC FIXATION;  Surgeon: Neil Balls, MD;  Location: [facility] OR;  Service: Orthopedics;  Laterality: Left;   TONSILLECTOMY  1949   TOTAL HIP ARTHROPLASTY  04/2006   right hip    TOTAL HIP REVISION  06/17/2012   "right" (06/17/2012)   TOTAL HIP REVISION  06/17/2012   Procedure: TOTAL HIP REVISION;  Surgeon: Ilean Mall, MD;  Location: [facility] OR;  Service: Orthopedics;  Laterality: Right;   Family History  Problem Relation Age of Onset   Heart failure Father        deceased age 6   Diabetes Father    Alcohol abuse Father    Breast cancer Mother        deceased at age 25 secondary to breast cancer   Liver  disease Sister        Liver failure--deceased   Dementia Maternal Grandmother    Colon cancer Neg Hx    Esophageal cancer Neg Hx    Rectal cancer Neg Hx    Stomach cancer Neg Hx    Social History   Socioeconomic History   Marital status: Widowed    Spouse name: Not on file   Number of children: Not on file   Years of education: Not on file   Highest education level: Bachelor's degree (e.g., BA, AB, BS)  Occupational History   Not on file  Tobacco Use   Smoking status: Former    Current packs/day: 0.00    Average packs/day: 0.5 packs/day for 10.0 years (5.0 ttl pk-yrs)    Types: Cigarettes    Start date: 07/14/1964    Quit date: 07/14/1974    Years since quitting: 49.4   Smokeless tobacco: Never   Tobacco comments:    quit in 1976 smoked for approx. 10 years  Vaping Use   Vaping status: Never Used  Substance and Sexual Activity   Alcohol use: Yes    Alcohol/week: 4.0 standard drinks of alcohol    Types: 4 Glasses of wine per week    Comment: Glass of wine once in awhile.   Drug use: No   Sexual activity: Never  Other Topics Concern   Not on file  Social History Narrative   Retired   The patient is married and has one child and two grandchildren that live in the area.     She drinks wine with dinner.  She quit smoking in 1976, smoked for   approximately 10 years.       Moved to [place] from [place], [place]         Social Drivers of Health   Financial Resource Strain: Low Risk  (12/06/2023)   Overall Financial Resource Strain (CARDIA)    Difficulty of Paying Living Expenses: Not hard at all  Food Insecurity: No Food Insecurity (12/06/2023)   Hunger Vital Sign    Worried About Running Out of Food in the Last Year: Never true    Ran Out of Food in the Last Year: Never true  Transportation Needs: Unmet Transportation Needs (12/06/2023)   PRAPARE - Administrator, Civil Service (Medical): Yes    Lack of Transportation (Non-Medical): No  Physical Activity:  Sufficiently Active (12/06/2023)  Exercise Vital Sign    Days of Exercise per Week: 7 days    Minutes of Exercise per Session: 30 min  Stress: No Stress Concern Present (12/06/2023)   [employer] of Occupational Health - Occupational Stress Questionnaire    Feeling of Stress : Only a little  Social Connections: Unknown (12/06/2023)   Social Connection and Isolation Panel [NHANES]    Frequency of Communication with Friends and Family: More than three times a week    Frequency of Social Gatherings with Friends and Family: More than three times a week    Attends Religious Services: Patient declined    Database administrator or Organizations: No    Attends Banker Meetings: Never    Marital Status: Widowed    Tobacco Counseling Counseling given: Not Answered Tobacco comments: quit in 1976 smoked for approx. 10 years    Clinical Intake:  Pre-visit preparation completed: Yes  Pain : No/denies pain     BMI - recorded: 38.62 Nutritional Risks: None Diabetes: No  Lab Results  Component Value Date   HGBA1C 5.4 11/27/2023   HGBA1C 5.8 12/05/2022   HGBA1C 5.9 02/15/2022     How often do you need to have someone help you when you read instructions, pamphlets, or other written materials from your doctor or pharmacy?: 1 - Never  Interpreter Needed?: No  Information entered by :: Juliann Ochoa   Activities of Daily Living     12/06/2023   11:46 AM  In your present state of health, do you have any difficulty performing the following activities:  Hearing? 0  Vision? 0  Difficulty concentrating or making decisions? 0  Walking or climbing stairs? 1  Dressing or bathing? 0  Doing errands, shopping? 0  Preparing Food and eating ? N  Using the Toilet? N  In the past six months, have you accidently leaked urine? Y  Do you have problems with loss of bowel control? N  Managing your Medications? N  Managing your Finances? N  Housekeeping or managing your  Housekeeping? N    Patient Care Team: Dorrene Gaucher, NP as PCP - General (Internal Medicine) Maximo Spar Aviva Lemmings, MD as PCP - Cardiology (Cardiology) Ivor Mars, MD as Consulting Physician (Hematology and Oncology) Rudine Cos, MD as Consulting Physician (Ophthalmology) Wendolyn Hamburger, MD as Consulting Physician (Orthopedic Surgery) Maximo Spar Aviva Lemmings, MD as Consulting Physician (Cardiology) Millicent Ally, MD as Consulting Physician (Cardiology) Virl Grimes, MD as Consulting Physician (Orthopedic Surgery) Cecilie Coffee, RPH-CPP (Pharmacist) Inc, [facility]  Indicate any recent Medical Services you may have received from other than [facility] providers in the past year (date may be approximate).     Assessment:    This is a routine wellness examination for Zap.  Hearing/Vision screen Hearing Screening - Comments:: No hearing difficulties Vision Screening - Comments:: Vision difficulties    Goals Addressed             This Visit's Progress    Increase physical activity   On track    Increase physical strength       Depression Screen     12/06/2023   11:49 AM 04/09/2023   11:23 AM 12/05/2022   10:53 AM 11/22/2021   11:07 AM 09/06/2021    1:48 PM 03/01/2021    2:14 PM 03/01/2021    2:13 PM  PHQ 2/9 Scores  PHQ - 2 Score 2 0 1 0 2 0 0  PHQ- 9 Score 2 0   4  0     Fall Risk     12/06/2023   11:45 AM 04/09/2023   11:23 AM 12/05/2022   10:54 AM 11/28/2022    2:43 PM 11/22/2021   11:06 AM  Fall Risk   Falls in the past year? 0 0 0 0 1  Number falls in past yr: 0 0 0 0 0  Injury with Fall? 0 0 0 0 0  Risk for fall due to : No Fall Risks No Fall Risks No Fall Risks  History of fall(s)  Follow up Falls evaluation completed Falls evaluation completed Falls evaluation completed  Falls evaluation completed    MEDICARE RISK AT HOME:  Medicare Risk at Home Any stairs in or around the home?: No If so, are there any without handrails?: No Home free of  loose throw rugs in walkways, pet beds, electrical cords, etc?: Yes Adequate lighting in your home to reduce risk of falls?: Yes Life alert?: No Use of a cane, walker or w/c?: Yes (wheelchair) Grab bars in the bathroom?: No Shower chair or bench in shower?: Yes Elevated toilet seat or a handicapped toilet?: Yes  TIMED UP AND GO:  Was the test performed?  No  Cognitive Function: 6CIT completed    08/10/2016   11:05 AM 08/06/2015   11:24 AM  MMSE - Mini Mental State Exam  Orientation to time 5 5  Orientation to Place 5 5  Registration 3 3  Attention/ Calculation 5 5  Recall 3 3  Language- name 2 objects 2 2  Language- repeat 1 1  Language- follow 3 step command 3 3  Language- read & follow direction 1 1  Write a sentence 1 1  Copy design 1 1  Total score 30 30        12/06/2023   11:44 AM 12/05/2022    3:09 PM 11/22/2021   11:12 AM  6CIT Screen  What Year? 0 points 0 points 0 points  What month? 0 points 0 points 0 points  What time? 0 points 0 points 0 points  Count back from 20 0 points 4 points 0 points  Months in reverse 0 points 0 points 0 points  Repeat phrase 0 points 0 points 0 points  Total Score 0 points 4 points 0 points    Immunizations Immunization History  Administered Date(s) Administered   Fluad Trivalent(High Dose 65+) 04/09/2023   Influenza Split 03/22/2012   Influenza Whole 04/28/2008, 05/04/2009   Influenza, High Dose Seasonal PF 04/01/2015, 03/27/2016, 03/13/2017, 04/02/2018, 04/04/2019, 03/24/2020, 04/13/2021   Influenza,inj,Quad PF,6+ Mos 03/19/2013, 03/25/2014   Influenza-Unspecified 04/10/2022   Moderna Covid-19 Vaccine Bivalent Booster 43yrs & up 07/28/2021   PFIZER(Purple Top)SARS-COV-2 Vaccination 09/11/2019, 09/30/2019, 04/23/2020   Pneumococcal Conjugate-13 06/25/2013   Pneumococcal Polysaccharide-23 05/27/2001, 06/18/2012   Td 05/26/2008   Tdap 07/28/2020   Unspecified SARS-COV-2 Vaccination 04/10/2022   Zoster  Recombinant(Shingrix) 07/28/2020, 01/28/2021   Zoster, Live 12/01/2010    Screening Tests Health Maintenance  Topic Date Due   COVID-19 Vaccine (6 - 2024-25 season) 03/11/2023   INFLUENZA VACCINE  02/08/2024   Medicare Annual Wellness (AWV)  12/05/2024   DTaP/Tdap/Td (3 - Td or Tdap) 07/28/2030   Pneumonia Vaccine 22+ Years old  Completed   DEXA SCAN  Completed   Zoster Vaccines- Shingrix  Completed   HPV VACCINES  Aged Out   Meningococcal B Vaccine  Aged Out   Colonoscopy  Discontinued   Hepatitis C Screening  Discontinued    Health Maintenance  Health Maintenance Due  Topic Date Due   COVID-19 Vaccine (6 - 2024-25 season) 03/11/2023   Health Maintenance Items Addressed:patient declined covid vaccines   Additional Screening:  Vision Screening: Recommended annual ophthalmology exams for early detection of glaucoma and other disorders of the eye.  Dental Screening: Recommended annual dental exams for proper oral hygiene  Community Resource Referral / Chronic Care Management: CRR required this visit?  No   CCM required this visit?  No   Plan:    I have personally reviewed and noted the following in the patient's chart:   Medical and social history Use of alcohol, tobacco or illicit drugs  Current medications and supplements including opioid prescriptions. Patient is not currently taking opioid prescriptions. Functional ability and status Nutritional status Physical activity Advanced directives List of other physicians Hospitalizations, surgeries, and ER visits in previous 12 months Vitals Screenings to include cognitive, depression, and falls Referrals and appointments  In addition, I have reviewed and discussed with patient certain preventive protocols, quality metrics, and best practice recommendations. A written personalized care plan for preventive services as well as general preventive health recommendations were provided to patient.   Freeda Jerry, [place]   12/06/2023   After Visit Summary: (MyChart) Due to this being a telephonic visit, the after visit summary with patients personalized plan was offered to patient via MyChart   Notes: Nothing significant to report at this time.

## 2023-12-10 ENCOUNTER — Telehealth: Payer: Self-pay

## 2023-12-10 NOTE — Telephone Encounter (Signed)
Verbal orders given for PT. 

## 2023-12-18 ENCOUNTER — Other Ambulatory Visit: Payer: Self-pay | Admitting: *Deleted

## 2023-12-18 DIAGNOSIS — R609 Edema, unspecified: Secondary | ICD-10-CM

## 2023-12-18 MED ORDER — TORSEMIDE 20 MG PO TABS
40.0000 mg | ORAL_TABLET | Freq: Two times a day (BID) | ORAL | 0 refills | Status: DC
Start: 2023-12-18 — End: 2024-02-04

## 2023-12-20 ENCOUNTER — Encounter: Payer: Self-pay | Admitting: Family

## 2023-12-20 MED ORDER — BISOPROLOL FUMARATE 10 MG PO TABS: 20.0000 mg | ORAL_TABLET | Freq: Every day | ORAL | 0 refills | Status: DC

## 2024-01-02 ENCOUNTER — Other Ambulatory Visit (HOSPITAL_COMMUNITY): Payer: Self-pay

## 2024-02-01 ENCOUNTER — Encounter: Payer: Self-pay | Admitting: Internal Medicine

## 2024-02-01 DIAGNOSIS — R609 Edema, unspecified: Secondary | ICD-10-CM

## 2024-02-02 ENCOUNTER — Other Ambulatory Visit (HOSPITAL_COMMUNITY): Payer: Self-pay

## 2024-02-04 ENCOUNTER — Other Ambulatory Visit (HOSPITAL_COMMUNITY): Payer: Self-pay

## 2024-02-04 ENCOUNTER — Other Ambulatory Visit: Payer: Self-pay

## 2024-02-04 MED ORDER — TORSEMIDE 20 MG PO TABS
40.0000 mg | ORAL_TABLET | Freq: Two times a day (BID) | ORAL | 0 refills | Status: DC
  Filled 2024-02-04: qty 90, 23d supply, fill #0

## 2024-02-11 ENCOUNTER — Telehealth: Payer: Self-pay

## 2024-02-11 NOTE — Telephone Encounter (Signed)
 VO given.

## 2024-02-11 NOTE — Telephone Encounter (Signed)
OK to give verbal order for PT please.  

## 2024-02-11 NOTE — Telephone Encounter (Signed)
 Copied from CRM [id number]. Topic: Clinical - Home Health Verbal Orders >> Feb 11, 2024  8:39 AM Antwanette L wrote: Caller/Agency: Mark/ [facility] Callback Number: [phone] Service Requested: Physical Therapy Frequency: 1w6(one visit per week for 6 weeks) starting 02/13/24 Any new concerns about the patient? No

## 2024-02-21 ENCOUNTER — Other Ambulatory Visit (HOSPITAL_BASED_OUTPATIENT_CLINIC_OR_DEPARTMENT_OTHER)

## 2024-02-25 ENCOUNTER — Other Ambulatory Visit: Payer: Self-pay | Admitting: Family

## 2024-02-25 DIAGNOSIS — I471 Supraventricular tachycardia, unspecified: Secondary | ICD-10-CM

## 2024-02-26 ENCOUNTER — Other Ambulatory Visit: Payer: Self-pay | Admitting: Internal Medicine

## 2024-02-26 ENCOUNTER — Ambulatory Visit: Admitting: Family

## 2024-02-26 ENCOUNTER — Ambulatory Visit (HOSPITAL_BASED_OUTPATIENT_CLINIC_OR_DEPARTMENT_OTHER)
Admission: RE | Admit: 2024-02-26 | Discharge: 2024-02-26 | Disposition: A | Discharge status: Home / Self Care | Source: Ambulatory Visit | Attending: Family | Admitting: Family

## 2024-02-26 ENCOUNTER — Other Ambulatory Visit (HOSPITAL_COMMUNITY): Payer: Self-pay

## 2024-02-26 VITALS — BP 119/56 | HR 55 | Temp 97.7°F | Resp 16 | Ht 64.0 in | Wt 216.0 lb

## 2024-02-26 DIAGNOSIS — N3281 Overactive bladder: Secondary | ICD-10-CM | POA: Diagnosis not present

## 2024-02-26 DIAGNOSIS — G4733 Obstructive sleep apnea (adult) (pediatric): Secondary | ICD-10-CM

## 2024-02-26 DIAGNOSIS — J302 Other seasonal allergic rhinitis: Secondary | ICD-10-CM | POA: Diagnosis not present

## 2024-02-26 DIAGNOSIS — I10 Essential (primary) hypertension: Secondary | ICD-10-CM

## 2024-02-26 DIAGNOSIS — E213 Hyperparathyroidism, unspecified: Secondary | ICD-10-CM

## 2024-02-26 DIAGNOSIS — R609 Edema, unspecified: Secondary | ICD-10-CM

## 2024-02-26 DIAGNOSIS — R739 Hyperglycemia, unspecified: Secondary | ICD-10-CM

## 2024-02-26 DIAGNOSIS — E673 Hypervitaminosis D: Secondary | ICD-10-CM | POA: Diagnosis not present

## 2024-02-26 DIAGNOSIS — N184 Chronic kidney disease, stage 4 (severe): Secondary | ICD-10-CM

## 2024-02-26 DIAGNOSIS — Z780 Asymptomatic menopausal state: Secondary | ICD-10-CM | POA: Insufficient documentation

## 2024-02-26 DIAGNOSIS — L03116 Cellulitis of left lower limb: Secondary | ICD-10-CM

## 2024-02-26 DIAGNOSIS — K219 Gastro-esophageal reflux disease without esophagitis: Secondary | ICD-10-CM

## 2024-02-26 DIAGNOSIS — F32A Depression, unspecified: Secondary | ICD-10-CM

## 2024-02-26 DIAGNOSIS — E785 Hyperlipidemia, unspecified: Secondary | ICD-10-CM

## 2024-02-26 DIAGNOSIS — Z872 Personal history of diseases of the skin and subcutaneous tissue: Secondary | ICD-10-CM

## 2024-02-26 DIAGNOSIS — J301 Allergic rhinitis due to pollen: Secondary | ICD-10-CM

## 2024-02-26 DIAGNOSIS — I4891 Unspecified atrial fibrillation: Secondary | ICD-10-CM

## 2024-02-26 DIAGNOSIS — I5032 Chronic diastolic (congestive) heart failure: Secondary | ICD-10-CM

## 2024-02-26 LAB — BASIC METABOLIC PANEL WITH GFR
BUN: 32 mg/dL — ABNORMAL HIGH (ref 6–23)
CO2: 31 meq/L (ref 19–32)
Calcium: 10.1 mg/dL (ref 8.4–10.5)
Chloride: 102 meq/L (ref 96–112)
Creatinine, Ser: 1.79 mg/dL — ABNORMAL HIGH (ref 0.40–1.20)
GFR: 26.38 mL/min — ABNORMAL LOW (ref >60.00)
Glucose, Bld: 101 mg/dL — ABNORMAL HIGH (ref 70–99)
Potassium: 3.8 meq/L (ref 3.5–5.1)
Sodium: 140 meq/L (ref 135–145)

## 2024-02-26 MED ORDER — SIMVASTATIN 10 MG PO TABS
10.0000 mg | ORAL_TABLET | Freq: Every day | ORAL | 1 refills | Status: DC
  Filled 2024-02-26 – 2024-03-29 (×5): qty 90, 90d supply, fill #0

## 2024-02-26 NOTE — Assessment & Plan Note (Signed)
 Clinically stable on demadex  40mg  bid.

## 2024-02-26 NOTE — Assessment & Plan Note (Signed)
 Resolved. Doing well with demadex  and compression stockings.

## 2024-02-26 NOTE — Assessment & Plan Note (Signed)
Stable on allegra.  ?

## 2024-02-26 NOTE — Assessment & Plan Note (Signed)
 Not currently following with Endo, update PTH.

## 2024-02-26 NOTE — Progress Notes (Unsigned)
 Subjective:     Patient ID: [name], female    DOB: August 13, 1940, 83 y.o.   MRN: [id number]  Chief Complaint  Patient presents with  . Hypertension    Here for follow up  . Hyperglycemia    Here for follow up  . Cellulitis    Follow up cellulitis on both legs    HPI  Discussed the use of AI scribe software for clinical note transcription with the patient, who gave verbal consent to proceed.  History of Present Illness       Health Maintenance Due  Topic Date Due  . COVID-19 Vaccine (6 - 2024-25 season) 03/11/2023  . INFLUENZA VACCINE  02/08/2024    Past Medical History:  Diagnosis Date  . Allergic rhinitis   . Allergy   . Ankle fracture    Left, Mortise Widening  . Anxiety 2020  . Arthritis    knuckles (06/17/2012)  . 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 Breast cancer (HCC)    right (06/17/2012)  . Cataracts, bilateral   . Depression 2020  . Displaced fracture of distal end of left fibula 07/30/2017  . Dyspnea    with exertion  . Exertional dyspnea   . GERD (gastroesophageal reflux disease)   . Hypertension   . Iron deficiency anemia    hematologist watches it (06/17/2012)  . Obesity   . OSA treated with BiPAP 03/08/2016  . Osteoarthritis    severe right hip osteoarthritis-s/p hip replacement  . Osteopenia 11/26/2009  . Thyroid  disorder    sees endocrinologist yearly (06/17/2012)    Past Surgical History:  Procedure Laterality Date  . ANKLE FRACTURE SURGERY Right 01/01/2014   Has pins. Procedure performed at [facility]  . BREAST BIOPSY  ` 1992   bilaterlly (06/17/2012)  . COLONOSCOPY W/ POLYPECTOMY    . EYE SURGERY  2011   cataract removal both eyes  . FRACTURE SURGERY    . HIP CLOSED REDUCTION  07/26/2012   Procedure: CLOSED MANIPULATION HIP;  Surgeon: Eva Elsie Herring, MD;  Location: [facility];  Service: Orthopedics;  Laterality: Right;  . JOINT REPLACEMENT  2007  . LUMBAR LAMINECTOMY  10/26/2017  . LUMBAR  LAMINECTOMY/DECOMPRESSION MICRODISCECTOMY Bilateral 10/25/2017   Procedure: LUMBAR 2-3, LUMBAR 3-4 DECOMPRESSION TIME REQUESTED 4.5 HOURS;  Surgeon: Beuford Anes, MD;  Location: [facility] OR;  Service: Orthopedics;  Laterality: Bilateral;  . MASTECTOMY  1993?   bilateral mastectomy  . ORIF ANKLE FRACTURE Left 07/30/2017   Procedure: LEFT OPEN REDUCTION INTERNAL FIXATION (ORIF) ANKLE FRACTURE WITH SYDESMOTIC FIXATION;  Surgeon: Yvone Rush, MD;  Location: [facility] OR;  Service: Orthopedics;  Laterality: Left;  . TONSILLECTOMY  1949  . TOTAL HIP ARTHROPLASTY  04/2006   right hip   . TOTAL HIP REVISION  06/17/2012   right (06/17/2012)  . TOTAL HIP REVISION  06/17/2012   Procedure: TOTAL HIP REVISION;  Surgeon: Dempsey JINNY Sensor, MD;  Location: [facility] OR;  Service: Orthopedics;  Laterality: Right;    Family History  Problem Relation Age of Onset  . Heart failure Father        deceased age 15  . Diabetes Father   . Alcohol abuse Father   . Breast cancer Mother        deceased at age 77 secondary to breast cancer  . Liver disease Sister        Liver failure--deceased  . Dementia Maternal Grandmother   . Colon cancer Neg Hx   . Esophageal cancer Neg Hx   .  Rectal cancer Neg Hx   . Stomach cancer Neg Hx     Social History   Socioeconomic History  . Marital status: Widowed    Spouse name: Not on file  . Number of children: Not on file  . Years of education: Not on file  . Highest education level: Bachelor's degree (e.g., BA, AB, BS)  Occupational History  . Not on file  Tobacco Use  . Smoking status: Former    Current packs/day: 0.00    Average packs/day: 0.5 packs/day for 10.0 years (5.0 ttl pk-yrs)    Types: Cigarettes    Start date: 07/14/1964    Quit date: 07/14/1974    Years since quitting: 49.6  . Smokeless tobacco: Never  . Tobacco comments:    quit in 1976 smoked for approx. 10 years  Vaping Use  . Vaping status: Never Used  Substance and Sexual Activity  . Alcohol use: Yes     Alcohol/week: 4.0 standard drinks of alcohol    Types: 4 Glasses of wine per week    Comment: Glass of wine once in awhile.  . Drug use: No  . Sexual activity: Never  Other Topics Concern  . Not on file  Social History Narrative   Retired   The patient is married and has one child and two grandchildren that live in the area.     She drinks wine with dinner.  She quit smoking in 1976, smoked for   approximately 10 years.       Moved to [place] from [place], [place]         Social Drivers of Health   Financial Resource Strain: Low Risk  (02/19/2024)   Overall Financial Resource Strain (CARDIA)   . Difficulty of Paying Living Expenses: Not hard at all  Food Insecurity: No Food Insecurity (02/19/2024)   Hunger Vital Sign   . Worried About Programme researcher, broadcasting/film/video in the Last Year: Never true   . Ran Out of Food in the Last Year: Never true  Transportation Needs: No Transportation Needs (02/19/2024)   PRAPARE - Transportation   . Lack of Transportation (Medical): No   . Lack of Transportation (Non-Medical): No  Recent Concern: Transportation Needs - Unmet Transportation Needs (12/06/2023)   PRAPARE - Transportation   . Lack of Transportation (Medical): Yes   . Lack of Transportation (Non-Medical): No  Physical Activity: Insufficiently Active (02/19/2024)   Exercise Vital Sign   . Days of Exercise per Week: 3 days   . Minutes of Exercise per Session: 30 min  Stress: Stress Concern Present (02/19/2024)   [employer] of Occupational Health - Occupational Stress Questionnaire   . Feeling of Stress: To some extent  Social Connections: Socially Isolated (02/19/2024)   Social Connection and Isolation Panel   . Frequency of Communication with Friends and Family: More than three times a week   . Frequency of Social Gatherings with Friends and Family: More than three times a week   . Attends Religious Services: Never   . Active Member of Clubs or Organizations: No   . Attends Tax inspector Meetings: Not on file   . Marital Status: Widowed  Intimate Partner Violence: Not At Risk (12/06/2023)   Humiliation, Afraid, Rape, and Kick questionnaire   . Fear of Current or Ex-Partner: No   . Emotionally Abused: No   . Physically Abused: No   . Sexually Abused: No    Outpatient Medications Prior to Visit  Medication Sig Dispense Refill  .  acetaminophen  (TYLENOL ) 500 MG tablet Take 1,000 mg by mouth in the morning, at noon, and at bedtime. Take with Gabapetin    . bisoprolol  (ZEBETA ) 10 MG tablet TAKE 2 TABLETS BY MOUTH DAILY 180 tablet 1  . cephALEXin  (KEFLEX ) 500 MG capsule Take 1 capsule (500 mg total) by mouth 3 (three) times daily. 21 capsule 0  . cholecalciferol  (VITAMIN D3) 25 MCG (1000 UNIT) tablet Take 1 tablet (1,000 Units total) by mouth daily. 90 tablet 1  . cyanocobalamin  (VITAMIN B12) 1000 MCG tablet Take 1 tablet (1,000 mcg total) by mouth daily.    . diltiazem  (CARDIZEM  CD) 120 MG 24 hr capsule Take 1 capsule (120 mg total) by mouth every morning. 90 capsule 1  . ELIQUIS  2.5 MG TABS tablet Take 1 tablet (2.5 mg total) by mouth 2 (two) times daily. 60 tablet 5  . fexofenadine (ALLEGRA) 180 MG tablet Take 180 mg by mouth daily as needed. For seasonal allergies    . FLUoxetine  (PROZAC ) 10 MG capsule TAKE 1 CAPSULE BY MOUTH AT  BEDTIME 90 capsule 3  . fluticasone  (FLONASE ) 50 MCG/ACT nasal spray USE 1 SPRAY IN EACH NOSTRIL EVERY DAY (Patient taking differently: Place 1 spray into both nostrils daily as needed.) 32 g 1  . gabapentin  (NEURONTIN ) 100 MG capsule Take 1 capsule (100 mg total) by mouth 3 (three) times daily. 270 capsule 1  . hydrALAZINE  (APRESOLINE ) 25 MG tablet TAKE 1 TABLET BY MOUTH IN THE  MORNING AND 1 TABLET BY MOUTH AT BEDTIME 180 tablet 3  . ketotifen (ZADITOR) 0.025 % ophthalmic solution Place 1 drop into both eyes 2 (two) times daily as needed.    . Omega-3 Fatty Acids (FISH OIL) 600 MG CAPS Take 2 capsules by mouth daily.    . potassium  chloride SA (KLOR-CON  M20) 20 MEQ tablet Take 1 tablet (20 mEq total) by mouth daily. 90 tablet 1  . simvastatin  (ZOCOR ) 10 MG tablet TAKE 1 TABLET BY MOUTH DAILY 90 tablet 1  . torsemide  (DEMADEX ) 20 MG tablet Take 2 tablets (40 mg total) by mouth 2 (two) times daily. Take 40 mg daily. May add an additional 20 mg on the days of swelling, weight gain or SOB. 90 tablet 0   No facility-administered medications prior to visit.    Allergies  Allergen Reactions  . Amlodipine  Swelling    Swelling   . Sulfonamide Derivatives Swelling    REACTION: Swelling of the face; eyes swelled shut  . Myrbetriq  [Mirabegron ]     Facial swelling, sob  . Chocolate Other (See Comments)    Sinus problems    ROS     Objective:    Physical Exam   BP (!) 119/56 (BP Location: Right Arm, Patient Position: Sitting, Cuff Size: Large)   Pulse (!) 55   Temp 97.7 F (36.5 C) (Oral)   Resp 16   Ht 5' 4 (1.626 m)   Wt 216 lb (98 kg)   LMP 07/11/1991   SpO2 98%   BMI 37.08 kg/m  Wt Readings from Last 3 Encounters:  02/26/24 216 lb (98 kg)  12/06/23 225 lb (102.1 kg)  11/27/23 229 lb (103.9 kg)       Assessment & Plan:   Problem List Items Addressed This Visit   None   I am having Nylan Salle Jackie maintain her fexofenadine, fluticasone , Fish Oil, acetaminophen , ketotifen, hydrALAZINE , potassium chloride  SA, FLUoxetine , simvastatin , Eliquis , diltiazem , cholecalciferol , cephALEXin , gabapentin , cyanocobalamin , torsemide , and bisoprolol .  No orders of the  defined types were placed in this encounter.

## 2024-02-26 NOTE — Assessment & Plan Note (Signed)
 Stable/improved with allegra.

## 2024-02-26 NOTE — Assessment & Plan Note (Signed)
 Stable on low dose prozac .

## 2024-02-26 NOTE — Assessment & Plan Note (Signed)
 BP Readings from Last 3 Encounters:  02/26/24 (!) 119/56  12/06/23 (!) 115/58  11/27/23 (!) 115/58   Stable on bisoprolol  and hydralazine .

## 2024-02-26 NOTE — Assessment & Plan Note (Signed)
 Lab Results  Component Value Date   CHOL 149 11/27/2023   HDL 71.70 11/27/2023   LDLCALC 63 11/27/2023   LDLDIRECT 110.4 12/13/2006   TRIG 69.0 11/27/2023   CHOLHDL 2 11/27/2023  Stable on simvastatin , continue same.

## 2024-02-26 NOTE — Assessment & Plan Note (Signed)
 Past due for follow up with nephrology- pt advised to call to arrange follow up.

## 2024-02-26 NOTE — Assessment & Plan Note (Signed)
 Stable on vit D 1000 international units daily.

## 2024-02-26 NOTE — Assessment & Plan Note (Signed)
 Rare reflux symptoms.  Uses famotidine  prn.

## 2024-02-26 NOTE — Assessment & Plan Note (Signed)
 Stable on eliquis , diltiazem , bisoprolol . Pt advised to keep her upcoming appointment with cardiology in Oct.

## 2024-02-26 NOTE — Assessment & Plan Note (Signed)
 Lab Results  Component Value Date   HGBA1C 5.4 11/27/2023   Stable. Monitor.

## 2024-02-26 NOTE — Assessment & Plan Note (Signed)
 Generally stable except for right after her diuretics.

## 2024-02-26 NOTE — Assessment & Plan Note (Signed)
 Stable on CPAP

## 2024-02-27 ENCOUNTER — Telehealth: Payer: Self-pay | Admitting: Family

## 2024-02-27 ENCOUNTER — Encounter: Payer: Self-pay | Admitting: Family

## 2024-02-27 ENCOUNTER — Ambulatory Visit: Payer: Self-pay | Admitting: Family

## 2024-02-27 DIAGNOSIS — M810 Age-related osteoporosis without current pathological fracture: Secondary | ICD-10-CM

## 2024-02-27 LAB — PTH, INTACT AND CALCIUM
Calcium: 10.6 mg/dL — ABNORMAL HIGH (ref 8.6–10.4)
PTH: 284 pg/mL — ABNORMAL HIGH (ref 16–77)

## 2024-02-27 NOTE — Telephone Encounter (Signed)
 I would like to initiate Prolia  for this patient please.   [name] can you please forward to [employer] Prolia ? It won't let me sign into the pool. tks

## 2024-02-27 NOTE — Patient Instructions (Signed)
 VISIT SUMMARY:  Today, we reviewed your chronic conditions and made adjustments to your treatment plan. Your leg swelling has improved with compression stockings, and your heart rate remains stable with your current medications. We also discussed your osteoporosis treatment and will attempt to get Prolia  approved with your new insurance.  YOUR PLAN:  CHRONIC LOWER EXTREMITY EDEMA: Swelling in your legs, which has improved with compression stockings. -Continue wearing compression stockings.  CHRONIC KIDNEY DISEASE: Kidney function concerns that previously affected your medication options. -Check kidney function in the lab today. -Contact insurance to attempt approval for Prolia .  OSTEOPOROSIS: Bone density loss, previously managed with Fosamax , now considering Prolia . -Check bone density results. -Attempt to get Prolia  approved with new insurance.  ATRIAL FIBRILLATION: Irregular heart rate managed with medications. -Continue Eliquis , diltiazem , and bisoprolol . -Follow up with cardiology in October.  HYPERTENSION: High blood pressure, well-controlled with medications. -Continue bisoprolol  and hydralazine .  TYPE 2 DIABETES MELLITUS, CONTROLLED: Blood sugar levels are well-controlled.  HYPERPARATHYROIDISM: Overactive parathyroid  glands, being monitored. -Check parathyroid  hormone levels in the lab today.  ALLERGIC RHINITIS: Nasal allergies managed with Allegra. -Continue Allegra daily.  GASTROESOPHAGEAL REFLUX DISEASE (GERD): Heartburn managed with famotidine  as needed. -Continue famotidine  as needed.  DEPRESSION: Mood stabilized with a low dose of Prozac . -Continue low-dose Prozac .

## 2024-02-28 ENCOUNTER — Telehealth: Payer: Self-pay | Admitting: Family

## 2024-02-28 ENCOUNTER — Encounter (HOSPITAL_COMMUNITY): Payer: Self-pay

## 2024-02-28 DIAGNOSIS — E213 Hyperparathyroidism, unspecified: Secondary | ICD-10-CM

## 2024-02-28 DIAGNOSIS — N189 Chronic kidney disease, unspecified: Secondary | ICD-10-CM

## 2024-02-28 NOTE — Telephone Encounter (Signed)
-----   Message from Donell PARAS [facility] sent at 02/28/2024  7:48 AM EDT ----- Metta morning Gardy Montanari,  Has she established with a nephrologist yet?  If not, please refer her to nephrology as they need to optimize calcium/phosphorus/calcitriol and go from there  Once nephrology has optimized her minerals/electrolytes I will be happy to see her again for further evaluation of hypercalcemia and elevated PTH   Thanks ----- Message ----- From: Daryl Setter, NP Sent: 02/27/2024  12:41 PM EDT To: Donell Redgie Butts, MD  Hi!  You have seen this patient in the past.  PTH is still high and calcium 10.6.  I am treating her for osteoporosis.  Is this something I should get her back in with you for or just continue to monitor?  Thanks,  Eulonda Andalon

## 2024-02-29 ENCOUNTER — Encounter (HOSPITAL_COMMUNITY): Payer: Self-pay

## 2024-02-29 ENCOUNTER — Telehealth: Payer: Self-pay

## 2024-02-29 ENCOUNTER — Other Ambulatory Visit (HOSPITAL_COMMUNITY): Payer: Self-pay

## 2024-02-29 DIAGNOSIS — R609 Edema, unspecified: Secondary | ICD-10-CM

## 2024-02-29 MED ORDER — TORSEMIDE 20 MG PO TABS
40.0000 mg | ORAL_TABLET | Freq: Two times a day (BID) | ORAL | 0 refills | Status: DC
  Filled 2024-02-29: qty 400, 80d supply, fill #0

## 2024-02-29 MED ORDER — DENOSUMAB 60 MG/ML ~~LOC~~ SOSY: 60.0000 mg | PREFILLED_SYRINGE | Freq: Once | SUBCUTANEOUS | Status: DC

## 2024-02-29 NOTE — Telephone Encounter (Signed)
 [name]

## 2024-02-29 NOTE — Telephone Encounter (Signed)
 Patient does not meet the requirements for medical PA (patient must fail BOTH an oral and IV bisphosphate). Patient can get Prolia  through pharmacy benefit. Completed referral for pharmacy benefit.   Pt ready for scheduling for PROLIA  on or after : 02/29/24  Option# 2- Med Obtained from pharmacy:  Pharmacy benefit: Copay $270 (Paid to pharmacy) Admin Fee: 0% (Pay at clinic)  Prior Auth: N/A PA# Expiration Date:   # of doses approved:   If patient wants fill through the pharmacy benefit please send prescription to: [facility], and include estimated need by date in rx notes. Pharmacy will ship medication directly to the office.  Patient NOT eligible for Prolia  Copay Card. Copay Card can make patient's cost as little as $25. Link to apply: https://www.amgensupportplus.com/copay  ** This summary of benefits is an estimation of the patient's out-of-pocket cost. Exact cost may very based on individual plan coverage.

## 2024-02-29 NOTE — Telephone Encounter (Signed)
 Prolia  VOB initiated via [email or web address]  Next Prolia  inj DUE: NEW START

## 2024-02-29 NOTE — Telephone Encounter (Signed)
Prolia initiated.

## 2024-03-03 ENCOUNTER — Other Ambulatory Visit: Payer: Self-pay | Admitting: Pharmacy Technician

## 2024-03-03 ENCOUNTER — Other Ambulatory Visit: Payer: Self-pay

## 2024-03-03 MED ORDER — DENOSUMAB 60 MG/ML ~~LOC~~ SOSY
60.0000 mg | PREFILLED_SYRINGE | SUBCUTANEOUS | 0 refills | Status: DC
Start: 2024-03-03 — End: 2024-03-11
  Filled 2024-03-03 – 2024-03-11 (×2): qty 1, 180d supply, fill #0

## 2024-03-03 NOTE — Addendum Note (Signed)
 Addended by: ESTELLE GILLIS D on: 03/03/2024 09:44 AM   Modules accepted: Orders

## 2024-03-03 NOTE — Progress Notes (Signed)
 Pharmacy Patient Advocate Encounter  Insurance verification completed.   The patient is insured through [employer] claim for Prolia . Co-pay is $270.  This test claim was processed through [facility]- copay amounts may vary at other pharmacies due to pharmacy/plan contracts, or as the patient moves through the different stages of their insurance plan.

## 2024-03-04 ENCOUNTER — Other Ambulatory Visit: Payer: Self-pay

## 2024-03-07 ENCOUNTER — Telehealth: Payer: Self-pay | Admitting: Neurology

## 2024-03-07 NOTE — Telephone Encounter (Signed)
 Copied from CRM [id number]. Topic: General - Other >> Mar 07, 2024 12:53 PM Burnard DEL wrote: Reason for CRM: Patient would like a phone call to get scheduled for her prolia  injection.

## 2024-03-08 ENCOUNTER — Other Ambulatory Visit (HOSPITAL_COMMUNITY): Payer: Self-pay

## 2024-03-10 ENCOUNTER — Other Ambulatory Visit: Payer: Self-pay | Admitting: Internal Medicine

## 2024-03-10 DIAGNOSIS — R609 Edema, unspecified: Secondary | ICD-10-CM

## 2024-03-11 ENCOUNTER — Other Ambulatory Visit: Payer: Self-pay

## 2024-03-11 ENCOUNTER — Telehealth: Payer: Self-pay

## 2024-03-11 ENCOUNTER — Telehealth: Payer: Self-pay | Admitting: Family

## 2024-03-11 DIAGNOSIS — M810 Age-related osteoporosis without current pathological fracture: Secondary | ICD-10-CM

## 2024-03-11 MED ORDER — JUBBONTI 60 MG/ML ~~LOC~~ SOSY
60.0000 mg | PREFILLED_SYRINGE | SUBCUTANEOUS | 0 refills | Status: AC
  Filled 2024-03-11: qty 1, 180d supply, fill #0

## 2024-03-11 NOTE — Progress Notes (Signed)
 Insurance now prefers Jubbonti . Requested new prescription from office.

## 2024-03-11 NOTE — Telephone Encounter (Signed)
 Left message on machine to call back to schedule prolia .  Can schedule for next week.

## 2024-03-11 NOTE — Telephone Encounter (Signed)
 Pt scheduled for 03/19/24

## 2024-03-11 NOTE — Telephone Encounter (Signed)
 Pharmacy tech says insurance will only pay for [name] , no longer will pay for prolia . Rx sent to [facility].

## 2024-03-11 NOTE — Progress Notes (Signed)
 Specialty Pharmacy Initial Fill Coordination Note  [name] is a 83 y.o. female contacted today regarding initial fill of specialty medication(s) Denosumab -bbdz (Jubbonti )   Patient requested Courier to Provider Office   Delivery date: 03/13/24   Verified address: [facility] [address]   Medication will be filled on 9/3.   Patient is aware of $270 copayment.

## 2024-03-11 NOTE — Telephone Encounter (Signed)
 Pt scheduled already.  ?

## 2024-03-11 NOTE — Progress Notes (Signed)
 Insurance no longer covers Prolia  as of 9/1. Product preferred is Jubbonti .   Ran test claim-$270

## 2024-03-11 NOTE — Telephone Encounter (Signed)
 Copied from CRM [id number]. Topic: General - Call Back - No Documentation >> Mar 11, 2024 10:01 AM [name] wrote: Reason for CRM: pt calling in to schedule prolia  shot apt p missed a from [employer]. Reached out to CAL to try to get her back on the line. P callback# is [phone]

## 2024-03-12 ENCOUNTER — Other Ambulatory Visit: Payer: Self-pay

## 2024-03-12 ENCOUNTER — Other Ambulatory Visit: Payer: Self-pay | Admitting: Internal Medicine

## 2024-03-12 DIAGNOSIS — R609 Edema, unspecified: Secondary | ICD-10-CM

## 2024-03-13 ENCOUNTER — Other Ambulatory Visit: Payer: Self-pay | Admitting: Family

## 2024-03-13 ENCOUNTER — Other Ambulatory Visit (HOSPITAL_COMMUNITY): Payer: Self-pay

## 2024-03-13 DIAGNOSIS — E785 Hyperlipidemia, unspecified: Secondary | ICD-10-CM

## 2024-03-13 NOTE — Telephone Encounter (Signed)
 Home health reporting a weight gain of 3 pounds, they will increase torsemide  by 20 mg  a day

## 2024-03-18 ENCOUNTER — Other Ambulatory Visit: Payer: Self-pay | Admitting: Family

## 2024-03-18 ENCOUNTER — Encounter: Payer: Self-pay | Admitting: Family

## 2024-03-18 MED ORDER — POTASSIUM CHLORIDE CRYS ER 20 MEQ PO TBCR: 20.0000 meq | EXTENDED_RELEASE_TABLET | Freq: Every day | ORAL | 1 refills | Status: DC

## 2024-03-19 ENCOUNTER — Telehealth: Payer: Self-pay | Admitting: Internal Medicine

## 2024-03-19 ENCOUNTER — Ambulatory Visit

## 2024-03-19 DIAGNOSIS — I471 Supraventricular tachycardia, unspecified: Secondary | ICD-10-CM

## 2024-03-19 NOTE — Telephone Encounter (Signed)
 STAT if HR is under 50 or over 120 (normal HR is 60-100 beats per minute)  What is your heart rate?   60  Do you have a log of your heart rate readings (document readings)?   48-51 at rest today 50-51 this morning  Do you have any other symptoms?   Fatigue   Caller [name]) noted that patient's HR has not risen above 60 for the last couple of weeks.  Caller requested a call back to the patient directly.

## 2024-03-19 NOTE — Telephone Encounter (Signed)
 Patient identification verified by 2 forms.   Called and spoke to patient  Patient states:  -Takes her pulse about 3 times daily.  -Heart rate has not been above 60, even with walking around.  -BP 140/80 HR 53 when checked by PT this morning.   Patient denies:  -Light headedness, symptoms.  -Checking blood pressure at home  Interventions/Plan: -Pt will start monitoring her blood pressure when she measures her pulse.  -Will get message to Dr. Mona for review.  -Pt will call Friday to give blood pressure readings   Reviewed ED warning signs/precautions  Patient agrees with plan, no questions at this time

## 2024-03-20 MED ORDER — BISOPROLOL FUMARATE 10 MG PO TABS: 10.0000 mg | ORAL_TABLET | Freq: Every day | ORAL | Status: DC

## 2024-03-20 NOTE — Telephone Encounter (Signed)
 Called PT with info from [facility] MD  Left message for patient that MD advice sent to MyChart

## 2024-03-20 NOTE — Telephone Encounter (Signed)
 Not sure the pulse is the cause of fatigue in the upper 50's, but she could try to take 10 mg bisoprolol  (1 tablet) daily instead of 2 tablets.  BP may go up a little bit because of this change, but pulse should be higher.   Dr. Mona

## 2024-03-20 NOTE — Addendum Note (Signed)
 Addended by: LORING ANDRIETTE HERO on: 03/20/2024 02:57 PM   Modules accepted: Orders

## 2024-03-20 NOTE — Telephone Encounter (Signed)
 Patient aware of message via MyChart Med list updated

## 2024-03-24 DIAGNOSIS — I4891 Unspecified atrial fibrillation: Secondary | ICD-10-CM

## 2024-03-24 DIAGNOSIS — F3341 Major depressive disorder, recurrent, in partial remission: Secondary | ICD-10-CM

## 2024-03-24 DIAGNOSIS — S8262XG Displaced fracture of lateral malleolus of left fibula, subsequent encounter for closed fracture with delayed healing: Secondary | ICD-10-CM

## 2024-03-24 DIAGNOSIS — I130 Hypertensive heart and chronic kidney disease with heart failure and stage 1 through stage 4 chronic kidney disease, or unspecified chronic kidney disease: Secondary | ICD-10-CM | POA: Diagnosis not present

## 2024-03-24 DIAGNOSIS — S8261XG Displaced fracture of lateral malleolus of right fibula, subsequent encounter for closed fracture with delayed healing: Secondary | ICD-10-CM

## 2024-03-24 DIAGNOSIS — G9341 Metabolic encephalopathy: Secondary | ICD-10-CM

## 2024-03-24 DIAGNOSIS — N184 Chronic kidney disease, stage 4 (severe): Secondary | ICD-10-CM | POA: Diagnosis not present

## 2024-03-24 DIAGNOSIS — A419 Sepsis, unspecified organism: Secondary | ICD-10-CM | POA: Diagnosis not present

## 2024-03-24 DIAGNOSIS — I509 Heart failure, unspecified: Secondary | ICD-10-CM | POA: Diagnosis not present

## 2024-03-24 DIAGNOSIS — D631 Anemia in chronic kidney disease: Secondary | ICD-10-CM

## 2024-03-24 DIAGNOSIS — M51369 Other intervertebral disc degeneration, lumbar region without mention of lumbar back pain or lower extremity pain: Secondary | ICD-10-CM

## 2024-03-28 ENCOUNTER — Other Ambulatory Visit: Payer: Self-pay

## 2024-03-29 ENCOUNTER — Other Ambulatory Visit (HOSPITAL_COMMUNITY): Payer: Self-pay

## 2024-03-31 ENCOUNTER — Encounter: Payer: Self-pay | Admitting: Internal Medicine

## 2024-04-05 ENCOUNTER — Other Ambulatory Visit (HOSPITAL_COMMUNITY): Payer: Self-pay

## 2024-04-08 NOTE — Telephone Encounter (Signed)
 Pt called and offered an appt with Callie Goodrich, PA-C on 10/3. Pt accepted appt.

## 2024-04-09 ENCOUNTER — Other Ambulatory Visit: Payer: Self-pay | Admitting: Family

## 2024-04-09 NOTE — Progress Notes (Unsigned)
 Cardiology Office Note   Date:  04/10/2024  ID:  [name], DOB 09/10/1940, MRN [id number] PCP: Daryl Setter, NP  [facility] Providers Cardiologist:  Vinie JAYSON Maxcy, MD    History of Present Illness [name] is a 83 y.o. female who was being evaluated for palpitations, hypertension, chronic diastolic heart failure, class III obesity, OSA treated with BiPAP here for follow-up appointment.  She had noted some palpitations at her last office visit and also was not particularly bothered by them.  Medical history significant for an episode congestive heart failure back in January 2016.  Presented with symptoms of anasarca including upper and lower extremity swelling.  At that time she was on amlodipine  was discontinued and she started on Lasix .  She had significant diuresis and echocardiogram however did showed normal systolic and reportedly diastolic function.  Based on these findings it is difficult to understand what this is cardiac edema or some other cause of volume overload.  Nonetheless, she had some palpitations recently and was fitted with a monitor.  Monitor demonstrated PACs, PVCs, and an episode of SVT which was transient and spontaneously terminated.  Does note that she been having some more frequent episodes of palpitations but they are not significantly bothersome to her.  Also reports heaviness in her chest when she does activities although she is very inactive due to her hip problems and walks with a cane.  She also has morbid obesity and hypertension.  She does take Bystolic and possibly has sleep apnea.  She did report some daytime fatigue, somnolence, poor sleep at night, witnessed snoring and occasional episode of apnea.  She was last seen December 2024.  She had no active cardiac complaints at that time.  She did have some lower extremity edema which was reasonably well-controlled with torsemide .  Had some interim weight gain and less activity.  Was in  a wheelchair at the time of office visit and initial blood pressure was surprisingly low at 90/50 however recheck was 106/56.  Her home blood pressure cuff usually was around 120 systolic.  EKG showed normal sinus rhythm and lipids were well-controlled with LDL 51 in May.  Today, she presents with a history of atrial fibrillation and diastolic dysfunction with fatigue and irregular heart rhythm.  She experiences significant fatigue and an irregular heart rhythm. She has a history of supraventricular tachycardia and is on bisoprolol  20 mg, diltiazem  120 mg, and Eliquis  2.5 mg. She monitors her heart rate and blood pressure at home, taking her blood pressure three times daily.  Shortness of breath has worsened slightly with her current heart issues. She manages lymphedema in her legs with torsemide  and support hose. Despite these symptoms, she is able to perform light exercises such as walking.  She has difficulty sleeping, which she attributes to her irregular heart rate. She uses a BiPAP machine for sleep apnea but still finds it hard to sleep, feeling 'worn out' and fatigued.  Her kidney function is monitored, with a creatinine level of 1.7, and she is on a reduced dose of Eliquis  due to her age and kidney function.  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  Her calcium is now elevated again per PCP so she has been referred back to endocrinology for further workup.  Reports no shortness of breath nor dyspnea on exertion. Reports no chest pain, pressure, or tightness. No edema, orthopnea, PND. Reports no palpitations.   Discussed the use of  AI scribe software for clinical note transcription with the patient, who gave verbal consent to proceed.   ROS: Pertinent ROS in HPI  Studies Reviewed EKG Interpretation Date/Time:  Thursday April 10 2024 14:45:35 EDT Ventricular Rate:  86 PR Interval:    QRS Duration:  96 QT  Interval:  370 QTC Calculation: 442 R Axis:   4  Text Interpretation: Atrial flutter with variable A-V block When compared with ECG of 18-Jun-2023 10:52, Atrial flutter has replaced Sinus rhythm Confirmed by Lucien Blanc [phone]) on 04/10/2024 3:06:58 PM    Echocardiogram 09/18/2023  Report is been scanned in.  Rhythm was consistent with atrial fibrillation.  Mild concentric hypertrophy of the left ventricular wall.  Left ventricular systolic function was normal with EF 50%.  No evidence of mitral stenosis.  Trivial to mild mitral regurgitation.  No significant aortic regurgitation.  Pulmonic valve is normal.  Right ventricle systolic pressure is mildly elevated.  Mild tricuspid valve regurgitation.  Pulmonary artery systolic pressure 39 mmHg.  Inferior vena cava normal in size greater than 50% collapse with inspiration.  Pulmonary veins are not visualized.      Physical Exam VS:  BP 120/80   Pulse 86   Ht 5' 4 (1.626 m)   Wt 209 lb 6.4 oz (95 kg)   LMP 07/11/1991   SpO2 98%   BMI 35.94 kg/m        Wt Readings from Last 3 Encounters:  04/10/24 209 lb 6.4 oz (95 kg)  02/26/24 216 lb (98 kg)  12/06/23 225 lb (102.1 kg)    GEN: Well nourished, well developed in no acute distress NECK: No JVD; No carotid bruits CARDIAC: IRIR, no murmurs, rubs, gallops RESPIRATORY:  Clear to auscultation without rales, wheezing or rhonchi  ABDOMEN: Soft, non-tender, non-distended EXTREMITIES:  No edema; No deformity   ASSESSMENT AND PLAN  Atrial fibrillation and atrial flutter Irregular heart rhythm on EKG, symptomatic with fatigue. Mixed atrial fibrillation and atrial flutter. Prefers medication management over cardioversion. - Increase diltiazem  to 180 mg daily. - Monitor blood pressure closely. - Consider cardioversion if symptoms persist. - Recommend Kardia device for home rhythm monitoring. - Consider Zio patch for AFib burden assessment. - Refer to EP specialist for potential antiarrhythmic  therapy.  Chronic diastolic heart failure/lymphedema Diastolic dysfunction with impaired relaxation. Symptoms include manageable shortness of breath and fatigue. - Continue torsemide  as prescribed. - Compression hose recommended, wearing today  Chronic kidney disease, stage 3b Chronic kidney disease with creatinine level of 1.7. Eliquis  dose adjusted based on age and kidney function. - Continue Eliquis  2.5 mg as prescribed.  Lymphedema of lower extremities Significant lymphedema in legs, managed with torsemide  and support hose. - Continue use of support hose. - Continue torsemide  as prescribed.  OSA, BIPAP compliant      Dispo: She can follow-up ASAP with Dr. Inocencio and in 6 months with Dr. Barnetta in [place] (patient preference)  Signed, Blanc LOISE Lucien, PA-C

## 2024-04-10 ENCOUNTER — Ambulatory Visit: Attending: Physician Assistant | Admitting: Physician Assistant

## 2024-04-10 ENCOUNTER — Encounter: Payer: Self-pay | Admitting: Physician Assistant

## 2024-04-10 ENCOUNTER — Other Ambulatory Visit (HOSPITAL_COMMUNITY): Payer: Self-pay

## 2024-04-10 VITALS — BP 120/80 | HR 86 | Ht 64.0 in | Wt 209.4 lb

## 2024-04-10 DIAGNOSIS — N1832 Chronic kidney disease, stage 3b: Secondary | ICD-10-CM

## 2024-04-10 DIAGNOSIS — I471 Supraventricular tachycardia, unspecified: Secondary | ICD-10-CM | POA: Diagnosis not present

## 2024-04-10 DIAGNOSIS — E66813 Obesity, class 3: Secondary | ICD-10-CM

## 2024-04-10 DIAGNOSIS — I5032 Chronic diastolic (congestive) heart failure: Secondary | ICD-10-CM

## 2024-04-10 DIAGNOSIS — R002 Palpitations: Secondary | ICD-10-CM | POA: Diagnosis not present

## 2024-04-10 DIAGNOSIS — R609 Edema, unspecified: Secondary | ICD-10-CM

## 2024-04-10 DIAGNOSIS — I10 Essential (primary) hypertension: Secondary | ICD-10-CM

## 2024-04-10 DIAGNOSIS — G4733 Obstructive sleep apnea (adult) (pediatric): Secondary | ICD-10-CM

## 2024-04-10 MED ORDER — DILTIAZEM HCL ER COATED BEADS 180 MG PO CP24
180.0000 mg | ORAL_CAPSULE | Freq: Every day | ORAL | 3 refills | Status: AC
  Filled 2024-04-10: qty 90, 90d supply, fill #0
  Filled 2024-06-21: qty 90, 90d supply, fill #1

## 2024-04-10 NOTE — Patient Instructions (Addendum)
 Thank you for choosing [facility]!     Medication Instructions:  Increase the diltiazem  to 180mg . Take this tablet daily.+  A referral has been placed for your to see Dr. Inocencio.  Monitor blood pressure for 2 weeks. Upload copy of blood pressure log to mychart.   *If you need a refill on your cardiac medications before your next appointment, please call your pharmacy*   Lab Work: No labs were ordered during today's visit.  If you have labs (blood work) drawn today and your tests are completely normal, you will receive your results only by: MyChart Message (if you have MyChart) OR A paper copy in the mail If you have any lab test that is abnormal or we need to change your treatment, we will call you to review the results.   Testing/Procedures: No procedures were ordered during today's visit.     Your next appointment:  Dr. Kate in [place] for general cardiology and Soyla Inocencio, MD for EP    Follow-Up: At [facility], you and your health needs are our priority.  As part of our continuing mission to provide you with exceptional heart care, we have created designated Provider Care Teams.  These Care Teams include your primary Cardiologist (physician) and Advanced Practice Providers (APPs -  Physician Assistants and Nurse Practitioners) who all work together to provide you with the care you need, when you need it. We recommend signing up for the patient portal called MyChart.  Sign up information is provided on this After Visit Summary.  MyChart is used to connect with patients for Virtual Visits (Telemedicine).  Patients are able to view lab/test results, encounter notes, upcoming appointments, etc.  Non-urgent messages can be sent to your provider as well.   To learn more about what you can do with MyChart, go to [email or web address].      BLOOD PRESSURE READINGS TAKE YOUR BLOOD PRESSURE AT LEAST 1 HOUR AFTER TAKING YOUR MEDICATION BRING LOG WITH  YOU TO YOUR FOLLOW UP APPOINTMENT FOR REVIEW   DATE TIME BP HR COMMENT DATE  TIME  BP HR COMMENT

## 2024-04-11 ENCOUNTER — Telehealth: Payer: Self-pay

## 2024-04-11 ENCOUNTER — Ambulatory Visit: Admitting: Student

## 2024-04-11 NOTE — Telephone Encounter (Signed)
 Spoke with patient and informed her Dr. Kate does not go to our [place] office. Patient states she would be OK to see any provider at the [facility] office as this is closer for her.  Updated recall for 6 month F/U to [facility] providers. Informed patient she can schedule F/U with one of those providers when we get closer to that time. Patient verbalized understanding and expressed appreciation for call.

## 2024-04-11 NOTE — Telephone Encounter (Signed)
-----   Message from Orren LOISE Fabry sent at 04/11/2024  4:18 PM EDT ----- Can we get her transition to an [place] provider?  Thanks! Orren LOISE Fabry ----- Message ----- From: Kate Lonni CROME, MD Sent: 04/11/2024  10:15 AM EDT To: Vinie JAYSON Maxcy, MD; Orren LOISE Fabry, PA-C  I'm not in [place] ----- Message ----- From: Fabry Orren LOISE DEVONNA Sent: 04/10/2024   4:12 PM EDT To: Vinie JAYSON Maxcy, MD; Lonni CROME [address]  She has desired to switch offices to [place] which is closer to her home.  Dr. Maxcy, are you okay with transitioning to Dr. Kate due to location?  I do think that she is in A-fib/flutter today and we have increased her diltiazem  to 180.  She does not want to undergo DCCV at least at this time.  We have got her follow-up with Dr. Inocencio as well who she will see on Monday. Thanks!

## 2024-04-14 ENCOUNTER — Encounter: Payer: Self-pay | Admitting: Cardiology

## 2024-04-14 ENCOUNTER — Encounter: Payer: Self-pay | Admitting: Physician Assistant

## 2024-04-14 ENCOUNTER — Ambulatory Visit: Attending: Cardiology | Admitting: Cardiology

## 2024-04-14 VITALS — BP 134/60 | HR 60 | Ht 64.5 in | Wt 213.0 lb

## 2024-04-14 DIAGNOSIS — D6869 Other thrombophilia: Secondary | ICD-10-CM

## 2024-04-14 DIAGNOSIS — I483 Typical atrial flutter: Secondary | ICD-10-CM | POA: Diagnosis not present

## 2024-04-14 DIAGNOSIS — G4733 Obstructive sleep apnea (adult) (pediatric): Secondary | ICD-10-CM

## 2024-04-14 DIAGNOSIS — I4819 Other persistent atrial fibrillation: Secondary | ICD-10-CM | POA: Diagnosis not present

## 2024-04-14 DIAGNOSIS — I484 Atypical atrial flutter: Secondary | ICD-10-CM

## 2024-04-14 DIAGNOSIS — I5032 Chronic diastolic (congestive) heart failure: Secondary | ICD-10-CM

## 2024-04-14 NOTE — Patient Instructions (Signed)
 Medication Instructions:  Your physician recommends that you continue on your current medications as directed. Please refer to the Current Medication list given to you today.  *If you need a refill on your cardiac medications before your next appointment, please call your pharmacy*   Lab Work: Pre procedure labs -- we will call you to schedule:  BMP & CBC  If you have a lab test that is abnormal and we need to change your treatment, we will call you to review the results -- otherwise no news is good news.    Testing/Procedures: Your physician has recommended that you have an ablation. Catheter ablation is a medical procedure used to treat some cardiac arrhythmias (irregular heartbeats). During catheter ablation, a long, thin, flexible tube is put into a blood vessel in your groin (upper thigh), or neck. This tube is called an ablation catheter. It is then guided to your heart through the blood vessel. Radio frequency waves destroy small areas of heart tissue where abnormal heartbeats may cause an arrhythmia to start. Please review the information below on ablation and after care.  Your ablation is scheduled for:  Next year, the office will contact you once we are able to start scheduling for 2026.    Follow-Up: At [facility], you and your health needs are our priority.  As part of our continuing mission to provide you with exceptional heart care, we have created designated Provider Care Teams.  These Care Teams include your primary Cardiologist (physician) and Advanced Practice Providers (APPs -  Physician Assistants and Nurse Practitioners) who all work together to provide you with the care you need, when you need it.  Your next appointment:   1 month(s) after your ablation  The format for your next appointment:   In Person  Provider:   AFib clinic   Thank you for choosing [facility]!!   Maeola Domino, RN [phone]    Other Instructions   Cardiac  Ablation Cardiac ablation is a procedure to destroy (ablate) some heart tissue that is sending bad signals. These bad signals cause problems in heart rhythm. The heart has many areas that make these signals. If there are problems in these areas, they can make the heart beat in a way that is not normal. Destroying some tissues can help make the heart rhythm normal. Tell your doctor about: Any allergies you have. All medicines you are taking. These include vitamins, herbs, eye drops, creams, and over-the-counter medicines. Any problems you or family members have had with medicines that make you fall asleep (anesthetics). Any blood disorders you have. Any surgeries you have had. Any medical conditions you have, such as kidney failure. Whether you are pregnant or may be pregnant. What are the risks? This is a safe procedure. But problems may occur, including: Infection. Bruising and bleeding. Bleeding into the chest. Stroke or blood clots. Damage to nearby areas of your body. Allergies to medicines or dyes. The need for a pacemaker if the normal system is damaged. Failure of the procedure to treat the problem. What happens before the procedure? Medicines Ask your doctor about: Changing or stopping your normal medicines. This is important. Taking aspirin  and ibuprofen. Do not take these medicines unless your doctor tells you to take them. Taking other medicines, vitamins, herbs, and supplements. General instructions Follow instructions from your doctor about what you cannot eat or drink. Plan to have someone take you home from the hospital or clinic. If you will be going home right after  the procedure, plan to have someone with you for 24 hours. Ask your doctor what steps will be taken to prevent infection. What happens during the procedure?  An IV tube will be put into one of your veins. You will be given a medicine to help you relax. The skin on your neck or groin will be numbed. A  cut (incision) will be made in your neck or groin. A needle will be put through your cut and into a large vein. A tube (catheter) will be put into the needle. The tube will be moved to your heart. Dye may be put through the tube. This helps your doctor see your heart. Small devices (electrodes) on the tube will send out signals. A type of energy will be used to destroy some heart tissue. The tube will be taken out. Pressure will be held on your cut. This helps stop bleeding. A bandage will be put over your cut. The exact procedure may vary among doctors and hospitals. What happens after the procedure? You will be watched until you leave the hospital or clinic. This includes checking your heart rate, breathing rate, oxygen, and blood pressure. Your cut will be watched for bleeding. You will need to lie still for a few hours. Do not drive for 24 hours or as long as your doctor tells you. Summary Cardiac ablation is a procedure to destroy some heart tissue. This is done to treat heart rhythm problems. Tell your doctor about any medical conditions you may have. Tell him or her about all medicines you are taking to treat them. This is a safe procedure. But problems may occur. These include infection, bruising, bleeding, and damage to nearby areas of your body. Follow what your doctor tells you about food and drink. You may also be told to change or stop some of your medicines. After the procedure, do not drive for 24 hours or as long as your doctor tells you. This information is not intended to replace advice given to you by your health care provider. Make sure you discuss any questions you have with your health care provider. Document Revised: 09/16/2021 Document Reviewed: 05/29/2019 Elsevier Patient Education  2023 Elsevier Inc.   Cardiac Ablation, Care After  This sheet gives you information about how to care for yourself after your procedure. Your health care provider may also give you more  specific instructions. If you have problems or questions, contact your health care provider. What can I expect after the procedure? After the procedure, it is common to have: Bruising around your puncture site. Tenderness around your puncture site. Skipped heartbeats. If you had an atrial fibrillation ablation, you may have atrial fibrillation during the first several months after your procedure.  Tiredness (fatigue).  Follow these instructions at home: Puncture site care  Follow instructions from your health care provider about how to take care of your puncture site. Make sure you: If present, leave stitches (sutures), skin glue, or adhesive strips in place. These skin closures may need to stay in place for up to 2 weeks. If adhesive strip edges start to loosen and curl up, you may trim the loose edges. Do not remove adhesive strips completely unless your health care provider tells you to do that. If a large square bandage is present, this may be removed 24 hours after surgery.  Check your puncture site every day for signs of infection. Check for: Redness, swelling, or pain. Fluid or blood. If your puncture site starts to bleed, lie  down on your back, apply firm pressure to the area, and contact your health care provider. Warmth. Pus or a bad smell. A pea or marble sized lump/knot at the site is normal and can take up to three months to resolve.  Driving Do not drive for at least 4 days after your procedure or however long your health care provider recommends. (Do not resume driving if you have previously been instructed not to drive for other health reasons.) Do not drive or use heavy machinery while taking prescription pain medicine. Activity Avoid activities that take a lot of effort for at least 7 days after your procedure. Do not lift anything that is heavier than 5 lb (4.5 kg) for one week.  No sexual activity for 1 week.  Return to your normal activities as told by your health care  provider. Ask your health care provider what activities are safe for you. General instructions Take over-the-counter and prescription medicines only as told by your health care provider. Do not use any products that contain nicotine or tobacco, such as cigarettes and e-cigarettes. If you need help quitting, ask your health care provider. You may shower after 24 hours, but Do not take baths, swim, or use a hot tub for 1 week.  Do not drink alcohol for 24 hours after your procedure. Keep all follow-up visits as told by your health care provider. This is important. Contact a health care provider if: You have redness, mild swelling, or pain around your puncture site. You have fluid or blood coming from your puncture site that stops after applying firm pressure to the area. Your puncture site feels warm to the touch. You have pus or a bad smell coming from your puncture site. You have a fever. You have chest pain or discomfort that spreads to your neck, jaw, or arm. You have chest pain that is worse with lying on your back or taking a deep breath. You are sweating a lot. You feel nauseous. You have a fast or irregular heartbeat. You have shortness of breath. You are dizzy or light-headed and feel the need to lie down. You have pain or numbness in the arm or leg closest to your puncture site. Get help right away if: Your puncture site suddenly swells. Your puncture site is bleeding and the bleeding does not stop after applying firm pressure to the area. These symptoms may represent a serious problem that is an emergency. Do not wait to see if the symptoms will go away. Get medical help right away. Call your local emergency services (911 in the [place] Do not drive yourself to the hospital. Summary After the procedure, it is normal to have bruising and tenderness at the puncture site in your groin, neck, or forearm. Check your puncture site every day for signs of infection. Get help right away if  your puncture site is bleeding and the bleeding does not stop after applying firm pressure to the area. This is a medical emergency. This information is not intended to replace advice given to you by your health care provider. Make sure you discuss any questions you have with your health care provider.

## 2024-04-14 NOTE — Progress Notes (Signed)
 Electrophysiology Office Note:   Date:  04/14/2024  ID:  [name], DOB 30-May-1941, MRN [id number]  Primary Cardiologist: Vinie JAYSON Maxcy, MD Primary Heart Failure: None Electrophysiologist: Karolina Zamor Gladis Norton, MD      History of Present Illness:   [name] is a 83 y.o. female with h/o hypertension, diastolic heart failure, obesity, sleep apnea seen today for  for Electrophysiology evaluation of atrial fibrillation at the request of [name].    She was recently seen in cardiology clinic with atrial fibrillation and diastolic heart failure.  She has fatigue and shortness of breath due to atrial fibrillation.  She does have lymphedema and is on torsemide  and support stockings.  She is able to perform light exercises such as walking.  2 weeks ago, she felt quite poorly with weakness, fatigue, shortness of breath.  Her diltiazem  was increased and she has felt much improved over the last few days.  Her heart rates are better controlled, suggesting she may have converted to sinus rhythm.  Review of systems complete and found to be negative unless listed in HPI.   EP Information / Studies Reviewed:    EKG is not ordered today. EKG from 04/10/2024 reviewed which showed atrial flutter        Risk Assessment/Calculations:    CHA2DS2-VASc Score = 5   This indicates a 7.2% annual risk of stroke. The patient's score is based upon: CHF History: 0 HTN History: 1 Diabetes History: 1 Stroke History: 0 Vascular Disease History: 0 Age Score: 2 Gender Score: 1           Physical Exam:   VS:  BP 134/60   Pulse 60   Ht 5' 4.5 (1.638 m)   Wt 213 lb (96.6 kg)   LMP 07/11/1991   SpO2 98%   BMI 36.00 kg/m    Wt Readings from Last 3 Encounters:  04/14/24 213 lb (96.6 kg)  04/10/24 209 lb 6.4 oz (95 kg)  02/26/24 216 lb (98 kg)     GEN: Well nourished, well developed in no acute distress NECK: No JVD; No carotid bruits CARDIAC: Regular rate and rhythm, no murmurs,  rubs, gallops RESPIRATORY:  Clear to auscultation without rales, wheezing or rhonchi  ABDOMEN: Soft, non-tender, non-distended EXTREMITIES:  No edema; No deformity   ASSESSMENT AND PLAN:    1.  Persistent atrial fibrillation/flutter: Has been feeling poorly in atrial fibrillation/flutter.  She has felt much improved over the last day or 2.  She may have converted to sinus rhythm.  Despite this, she would like a more permanent rhythm control strategy.  Due to that, we Sierra Spargo plan for ablation.  We did discuss amiodarone, but she would like to avoid long-term antiarrhythmics if possible.  Risk, benefits, and alternatives to EP study and radiofrequency/pulse field ablation for afib were also discussed in detail today. These risks include but are not limited to stroke, bleeding, vascular damage, tamponade, perforation, damage to the esophagus, lungs, and other structures, pulmonary vein stenosis, worsening renal function, and death. The patient understands these risk and wishes to proceed.  We Deryck Hippler therefore proceed with catheter ablation at the next available time.  Carto, ICE, anesthesia are requested for the procedure.  This patient Royelle Hinchman NOT require CT prior to ablation  2.  Secondary hypercoagulable state: On Eliquis   3.  Chronic diastolic heart failure: Has lymphedema.  Management per primary cardiology.  4.  Obstructive sleep apnea: BiPAP compliance encouraged  Case discussed with primary cardiology  Follow up with  Afib Clinic as usual post procedure  Signed, Derrik Mceachern Gladis Norton, MD

## 2024-04-16 ENCOUNTER — Other Ambulatory Visit: Payer: Self-pay | Admitting: *Deleted

## 2024-04-16 ENCOUNTER — Ambulatory Visit: Admitting: Nurse Practitioner

## 2024-04-16 ENCOUNTER — Telehealth: Payer: Self-pay

## 2024-04-16 DIAGNOSIS — M810 Age-related osteoporosis without current pathological fracture: Secondary | ICD-10-CM

## 2024-04-16 MED ORDER — DENOSUMAB-BBDZ 60 MG/ML ~~LOC~~ SOSY
60.0000 mg | PREFILLED_SYRINGE | Freq: Once | SUBCUTANEOUS | Status: AC
  Administered 2024-05-28: 60 mg via SUBCUTANEOUS

## 2024-04-16 NOTE — Telephone Encounter (Signed)
 Copied from CRM #[id number]. Topic: Clinical - Home Health Verbal Orders >> Apr 15, 2024  4:35 PM Drema MATSU wrote: Caller/Agency: Glennda Gaba  Callback Number: [id number] confidential voicemail Service Requested: Occupational Therapy Frequency: 1 week 8 Any new concerns about the patient? No

## 2024-04-16 NOTE — Telephone Encounter (Signed)
O.K to continue OT.

## 2024-04-16 NOTE — Telephone Encounter (Signed)
Verbal orders given to continue OT.

## 2024-04-22 ENCOUNTER — Encounter: Payer: Self-pay | Admitting: Family

## 2024-04-22 ENCOUNTER — Telehealth: Payer: Self-pay

## 2024-04-22 ENCOUNTER — Other Ambulatory Visit (HOSPITAL_COMMUNITY): Payer: Self-pay

## 2024-04-22 NOTE — Telephone Encounter (Signed)
 Jubbonti  is now preferred for patient's insurance (pharmacy benefit). No PA is required and patient's copay is $---   REFILL TOO SOON. LAST FILLED 03/11/24. NEXT FILL 07/24/24.

## 2024-04-22 NOTE — Progress Notes (Signed)
 Pharmacy Quality Measure Review  This patient is appearing on a report for being at risk of failing the adherence measure for hypertension (ACEi/ARB) medications this calendar year.   Medication: Valsartan  320mg  Last fill date: 04/20 for 90 day supply  Documentation is somewhat unclear, appears to have discontinued between 06/2023-10/2023 due to lack of need and possible hypotension from multiple BP lowering medications. Not currently active on medication list.  Attempted to call x2, LVM that documentation of this call to confirm she is no longer taking valsartan  was made. Will route note to PCP and clinic PharmD for further follow-up.  Angela Baalmann, PharmD [facility] Pharmacist

## 2024-04-29 ENCOUNTER — Other Ambulatory Visit (HOSPITAL_COMMUNITY): Payer: Self-pay

## 2024-04-29 ENCOUNTER — Other Ambulatory Visit: Payer: Self-pay | Admitting: Family

## 2024-04-29 ENCOUNTER — Other Ambulatory Visit: Payer: Self-pay

## 2024-04-29 MED ORDER — VITAMIN D 25 MCG (1000 UNIT) PO TABS
1000.0000 [IU] | ORAL_TABLET | Freq: Every day | ORAL | 1 refills | Status: AC
  Filled 2024-04-29: qty 90, 90d supply, fill #0
  Filled 2024-08-03: qty 90, 90d supply, fill #1

## 2024-05-02 ENCOUNTER — Other Ambulatory Visit: Payer: Self-pay | Admitting: Family

## 2024-05-04 MED ORDER — ELIQUIS 2.5 MG PO TABS
2.5000 mg | ORAL_TABLET | Freq: Two times a day (BID) | ORAL | 5 refills | Status: AC
  Filled 2024-05-04: qty 60, 30d supply, fill #0
  Filled 2024-05-30: qty 60, 30d supply, fill #1
  Filled 2024-07-01: qty 60, 30d supply, fill #2
  Filled 2024-07-30: qty 60, 30d supply, fill #3

## 2024-05-05 ENCOUNTER — Other Ambulatory Visit: Payer: Self-pay

## 2024-05-05 ENCOUNTER — Other Ambulatory Visit (HOSPITAL_COMMUNITY): Payer: Self-pay

## 2024-05-14 ENCOUNTER — Encounter: Payer: Self-pay | Admitting: Internal Medicine

## 2024-05-14 DIAGNOSIS — I4819 Other persistent atrial fibrillation: Secondary | ICD-10-CM

## 2024-05-19 ENCOUNTER — Telehealth (HOSPITAL_COMMUNITY): Payer: Self-pay | Admitting: Physician Assistant

## 2024-05-21 ENCOUNTER — Encounter (HOSPITAL_COMMUNITY): Payer: Self-pay | Admitting: Internal Medicine

## 2024-05-21 ENCOUNTER — Other Ambulatory Visit (HOSPITAL_COMMUNITY): Payer: Self-pay

## 2024-05-21 ENCOUNTER — Ambulatory Visit (HOSPITAL_COMMUNITY)
Admission: RE | Admit: 2024-05-21 | Discharge: 2024-05-21 | Disposition: A | Discharge status: Home / Self Care | Source: Ambulatory Visit | Attending: Internal Medicine | Admitting: Internal Medicine

## 2024-05-21 VITALS — BP 104/60 | HR 89 | Ht 64.5 in | Wt 212.8 lb

## 2024-05-21 DIAGNOSIS — I4819 Other persistent atrial fibrillation: Secondary | ICD-10-CM

## 2024-05-21 DIAGNOSIS — D6869 Other thrombophilia: Secondary | ICD-10-CM | POA: Diagnosis not present

## 2024-05-21 MED ORDER — AMIODARONE HCL 200 MG PO TABS
ORAL_TABLET | ORAL | 0 refills | Status: AC
  Filled 2024-05-21: qty 120, 90d supply, fill #0
  Filled 2024-07-30: qty 90, 90d supply, fill #1

## 2024-05-21 NOTE — Progress Notes (Signed)
 Primary Care Physician: Daryl Setter, NP Primary Cardiologist: Vinie JAYSON Maxcy, MD Electrophysiologist: Will Gladis Norton, MD     Referring Physician: Dr. Norton [name] is a 83 y.o. female with a history of HTN, T2DM, diastolic HF, obesity, sleep apnea, and persistent atrial fibrillation who presents for consultation in the [facility]. Seen by Dr. Norton on 10/6 and planned for ablation; declined amiodarone. Patient contacted clinic on 11/5 noting she was back in Afib. Patient is on Eliquis  for stroke prevention.  On evaluation today, patient is currently in atrial flutter. She is interested in treatment prior to ablation. No missed doses of Eliquis .   Today, she denies symptoms of palpitations, chest pain, shortness of breath, orthopnea, PND, lower extremity edema, dizziness, presyncope, syncope, bleeding, or neurologic sequela. The patient is tolerating medications without difficulties and is otherwise without complaint today.    she has a BMI of Body mass index is 35.96 kg/m.SABRA Filed Weights   05/21/24 0859  Weight: 96.5 kg    Current Outpatient Medications  Medication Sig Dispense Refill   acetaminophen  (TYLENOL ) 500 MG tablet Take 1,000 mg by mouth in the morning, at noon, and at bedtime. Take with Gabapetin     amiodarone (PACERONE) 200 MG tablet Take 1 tablet (200 mg total) by mouth 2 (two) times daily for 30 days, THEN 1 tablet (200 mg total) daily. 240 tablet 0   bisoprolol  (ZEBETA ) 10 MG tablet Take 20 mg by mouth daily.     cholecalciferol  (VITAMIN D3) 25 MCG (1000 UNIT) tablet Take 1 tablet (1,000 Units total) by mouth daily. 90 tablet 1   CRANBERRY PO Take 1 capsule by mouth daily.     cyanocobalamin  (VITAMIN B12) 1000 MCG tablet Take 1 tablet (1,000 mcg total) by mouth daily.     denosumab -bbdz (JUBBONTI ) 60 MG/ML SOSY Inject 60 mg into the skin every 6 (six) months. 1 mL 0   diltiazem  (CARDIZEM  CD) 180 MG 24 hr  capsule Take 1 capsule (180 mg total) by mouth daily. 90 capsule 3   ELIQUIS  2.5 MG TABS tablet Take 1 tablet (2.5 mg total) by mouth 2 (two) times daily. 60 tablet 5   fexofenadine (ALLEGRA) 180 MG tablet Take 180 mg by mouth daily as needed. For seasonal allergies     FLUoxetine  (PROZAC ) 10 MG capsule TAKE 1 CAPSULE BY MOUTH AT  BEDTIME 90 capsule 3   fluticasone  (FLONASE ) 50 MCG/ACT nasal spray USE 1 SPRAY IN EACH NOSTRIL EVERY DAY 32 g 1   gabapentin  (NEURONTIN ) 100 MG capsule Take 1 capsule (100 mg total) by mouth 3 (three) times daily. 270 capsule 1   hydrALAZINE  (APRESOLINE ) 25 MG tablet Take 1 tablet (25 mg total) by mouth in the morning and at bedtime. 180 tablet 1   ketotifen (ZADITOR) 0.025 % ophthalmic solution Place 1 drop into both eyes 2 (two) times daily as needed.     Omega-3 Fatty Acids (FISH OIL) 600 MG CAPS Take 2 capsules by mouth daily.     potassium chloride  SA (KLOR-CON  M20) 20 MEQ tablet Take 1 tablet (20 mEq total) by mouth daily. 90 tablet 1   simvastatin  (ZOCOR ) 10 MG tablet Take 1 tablet (10 mg total) by mouth daily. 90 tablet 1   torsemide  (DEMADEX ) 20 MG tablet TAKE 2 TABLETS (40 MG TOTAL) BY MOUTH 2 (TWO) TIMES DAILY. TAKE 40 MG DAILY. MAY ADD AN ADDITIONAL 20 MG ON THE DAYS OF SWELLING, WEIGHT GAIN  OR SOB. 405 tablet 0   Current Facility-Administered Medications  Medication Dose Route Frequency Provider Last Rate Last Admin   denosumab -bbdz SOSY 60 mg  60 mg Subcutaneous Once Daryl Setter, NP        Atrial Fibrillation Management history:  Previous antiarrhythmic drugs: none Previous cardioversions: none Previous ablations: none Anticoagulation history: Eliquis  2.5 mg   ROS- All systems are reviewed and negative except as per the HPI above.  Physical Exam: BP 104/60   Pulse 89   Ht 5' 4.5 (1.638 m)   Wt 96.5 kg   LMP 07/11/1991   BMI 35.96 kg/m   GEN: Well nourished, well developed in no acute distress NECK: No JVD; No carotid  bruits CARDIAC: Irregularly irregular rate and rhythm, no murmurs, rubs, gallops RESPIRATORY:  Clear to auscultation without rales, wheezing or rhonchi  ABDOMEN: Soft, non-tender, non-distended EXTREMITIES:  No edema; No deformity   EKG today demonstrates  Vent. rate 89 BPM PR interval * ms QRS duration 100 ms QT/QTcB 376/457 ms P-R-T axes * 22 50 Atrial flutter with variable A-V block Abnormal ECG When compared with ECG of 10-Apr-2024 14:45, No significant change was found  Echo 09/06/20 demonstrated  1. Left ventricular ejection fraction, by estimation, is 60 to 65%. The  left ventricle has normal function. The left ventricle has no regional  wall motion abnormalities. Left ventricular diastolic parameters are  consistent with Grade II diastolic  dysfunction (pseudonormalization). Elevated left atrial pressure.   2. Right ventricular systolic function is normal. The right ventricular  size is normal. There is moderately elevated pulmonary artery systolic  pressure. The estimated right ventricular systolic pressure is 59.9 mmHg.   3. Left atrial size was mildly dilated.   4. The mitral valve is normal in structure. Trivial mitral valve  regurgitation. No evidence of mitral stenosis.   5. Tricuspid valve regurgitation is moderate.   6. The aortic valve is tricuspid. Aortic valve regurgitation is not  visualized. No aortic stenosis is present.   7. The inferior vena cava is normal in size with greater than 50%  respiratory variability, suggesting right atrial pressure of 3 mmHg.    ASSESSMENT & PLAN CHA2DS2-VASc Score = 5  The patient's score is based upon: CHF History: 0 HTN History: 1 Diabetes History: 1 Stroke History: 0 Vascular Disease History: 0 Age Score: 2 Gender Score: 1       ASSESSMENT AND PLAN: Persistent Atrial Fibrillation (ICD10:  I48.19) The patient's CHA2DS2-VASc score is 5, indicating a 7.2% annual risk of stroke.    Patient is currently in Afib.  We discussed rhythm control options to maintain sinus rhythm until ablation is scheduled. After discussion, will begin amiodarone 200 mg BID x 1 month then transition to 200 mg once daily. Recheck ECG in several weeks to determine if needs cardioversion.  Secondary Hypercoagulable State (ICD10:  D68.69) The patient is at significant risk for stroke/thromboembolism based upon her CHA2DS2-VASc Score of 5.  Continue Apixaban  (Eliquis ).  No missed doses. Continue Eliquis  2.5 mg BID. Dosage is correct based on age and elevated creatinine > 1.5.     Follow up 3 weeks for repeat ECG.   Terra Pac, [facility] [address] [place], [place] [address] [phone]

## 2024-05-21 NOTE — Patient Instructions (Signed)
 Start Amiodarone 200mg  twice daily 1 month and once daily after

## 2024-05-28 ENCOUNTER — Ambulatory Visit: Payer: Self-pay | Admitting: Family

## 2024-05-28 ENCOUNTER — Ambulatory Visit: Admitting: Family

## 2024-05-28 VITALS — BP 108/48 | HR 52 | Temp 97.4°F | Resp 16 | Ht 64.0 in | Wt 216.4 lb

## 2024-05-28 DIAGNOSIS — E785 Hyperlipidemia, unspecified: Secondary | ICD-10-CM

## 2024-05-28 DIAGNOSIS — M810 Age-related osteoporosis without current pathological fracture: Secondary | ICD-10-CM

## 2024-05-28 DIAGNOSIS — Z23 Encounter for immunization: Secondary | ICD-10-CM | POA: Diagnosis not present

## 2024-05-28 DIAGNOSIS — I10 Essential (primary) hypertension: Secondary | ICD-10-CM | POA: Diagnosis not present

## 2024-05-28 DIAGNOSIS — J302 Other seasonal allergic rhinitis: Secondary | ICD-10-CM

## 2024-05-28 DIAGNOSIS — F32A Depression, unspecified: Secondary | ICD-10-CM

## 2024-05-28 DIAGNOSIS — N184 Chronic kidney disease, stage 4 (severe): Secondary | ICD-10-CM

## 2024-05-28 DIAGNOSIS — K219 Gastro-esophageal reflux disease without esophagitis: Secondary | ICD-10-CM

## 2024-05-28 LAB — BASIC METABOLIC PANEL WITH GFR
BUN: 37 mg/dL — ABNORMAL HIGH (ref 6–23)
CO2: 30 meq/L (ref 19–32)
Calcium: 10.7 mg/dL — ABNORMAL HIGH (ref 8.4–10.5)
Chloride: 100 meq/L (ref 96–112)
Creatinine, Ser: 2.14 mg/dL — ABNORMAL HIGH (ref 0.40–1.20)
GFR: 21.25 mL/min — ABNORMAL LOW (ref >60.00)
Glucose, Bld: 118 mg/dL — ABNORMAL HIGH (ref 70–99)
Potassium: 4.3 meq/L (ref 3.5–5.1)
Sodium: 139 meq/L (ref 135–145)

## 2024-05-28 MED ORDER — DENOSUMAB-BBDZ 60 MG/ML ~~LOC~~ SOSY: 60.0000 mg | PREFILLED_SYRINGE | Freq: Once | SUBCUTANEOUS | Status: AC

## 2024-05-28 MED ORDER — BISOPROLOL FUMARATE 10 MG PO TABS: 10.0000 mg | ORAL_TABLET | Freq: Every day | ORAL | 1 refills | Status: AC

## 2024-05-28 NOTE — Assessment & Plan Note (Signed)
 Stable with rare dose of famotidine .

## 2024-05-28 NOTE — Assessment & Plan Note (Signed)
Mood is stable on prozac. Continue same.

## 2024-05-28 NOTE — Assessment & Plan Note (Signed)
 Stable on allegra.  Not using flonase  currently.

## 2024-05-28 NOTE — Assessment & Plan Note (Signed)
 Jubonti injection today.

## 2024-05-28 NOTE — Patient Instructions (Signed)
  VISIT SUMMARY: Today, you came in for a follow-up regarding your cardiac management and other health concerns. We discussed your atrial fibrillation, kidney function, sleep apnea, osteoporosis, cholesterol levels, mood, heartburn, and allergies. You received your Prolia  injection and a flu shot.  YOUR PLAN: -ATRIAL FIBRILLATION WITH CHRONIC DIASTOLIC HEART FAILURE: Atrial fibrillation is an irregular and often rapid heart rate that can lead to poor blood flow. You will continue taking amiodarone until your scheduled ablation in January. Please monitor your blood pressure and heart rate, and report if your systolic blood pressure drops below 100 or if your dizziness worsens. Continue taking bisoprolol  and diltiazem  as prescribed.  -CHRONIC KIDNEY DISEASE STAGE 4: Chronic kidney disease is a gradual loss of kidney function over time. We have ordered blood work to check your kidney function and encourage you to follow up with your nephrologist.  -OBSTRUCTIVE SLEEP APNEA: Obstructive sleep apnea is a condition where your breathing stops and starts during sleep. You are currently using CPAP and tolerating it well, so we do not recommend the Inspire device at this time. Continue with your CPAP therapy.  -AGE-RELATED OSTEOPOROSIS: Osteoporosis is a condition that weakens bones, making them fragile and more likely to break. You received your Prolia  injection today and we discussed the potential for decreased calcium levels. Continue with your Prolia  injections as scheduled.  -HYPERLIPIDEMIA: Hyperlipidemia is having high levels of fats (lipids) in your blood. Your cholesterol levels were very good in May. Continue taking simvastatin  10 mg daily.  -DEPRESSION: Depression is a mood disorder that causes a persistent feeling of sadness and loss of interest. Your mood is well-managed on your current dose of fluoxetine . Continue taking fluoxetine  10 mg at bedtime.  -GASTROESOPHAGEAL REFLUX DISEASE:  Gastroesophageal reflux disease (GERD) is a digestive disorder that affects the ring of muscle between your esophagus and stomach. You can continue taking famotidine  or Pepcid  as needed for heartburn.  -SEASONAL ALLERGIC RHINITIS: Seasonal allergic rhinitis is an allergic reaction to pollen and other allergens that cause sneezing, runny nose, and other similar symptoms. Continue using Allegra and Flonase  as needed for your allergy symptoms.  INSTRUCTIONS: Please monitor your blood pressure and heart rate, and report if your systolic blood pressure drops below 100 or if your dizziness worsens. Follow up with your nephrologist regarding your kidney function. Continue with your scheduled ablation in January.

## 2024-05-28 NOTE — Assessment & Plan Note (Signed)
 Lab Results  Component Value Date   CHOL 149 11/27/2023   HDL 71.70 11/27/2023   LDLCALC 63 11/27/2023   LDLDIRECT 110.4 12/13/2006   TRIG 69.0 11/27/2023   CHOLHDL 2 11/27/2023   Continues simvastatin .

## 2024-05-28 NOTE — Assessment & Plan Note (Signed)
 Has not scheduled with Nephrology- agrees to arrange follow up.

## 2024-05-28 NOTE — Progress Notes (Signed)
 Subjective:     Patient ID: [name], female    DOB: 12-02-1940, 83 y.o.   MRN: [id number]  Chief Complaint  Patient presents with   Hypertension    Here for follow up    Osteoporosis    Pt on prolia      HPI  Discussed the use of AI scribe software for clinical note transcription with the patient, who gave verbal consent to proceed.  History of Present Illness [name] is an 83 year old female with atrial fibrillation who presents for follow-up regarding her cardiac management.  She has a history of atrial fibrillation and previously discussed an ablation in October, which was postponed until January. Initially, she returned to sinus rhythm but reverted to atrial fibrillation. She was seen at the AFib clinic and started on amiodarone as a bridge until the ablation can be scheduled. Since starting amiodarone, she experiences low blood pressure with occasional dizziness. Her blood pressure has been as low as 108/48, but it has not dropped below 100 at home. She is currently on amiodarone and bisoprolol , taking one tablet of bisoprolol  since the dosage of diltiazem  was increased.  She inquires about the Inspire device for sleep apnea but is currently using CPAP and tolerating it well. She mentions wearing compression hose and discusses concerns about swelling, which she attributes to either amiodarone or increased fluid intake. Her weight has been trending upward, and she is considering taking extra torsemide  as needed for swelling, weight gain, or shortness of breath.  She is also on simvastatin , with her last cholesterol check in May showing good results. She takes Prozac  for mood, which is stable, and uses Allegra for year-round allergies. She occasionally takes famotidine  for heartburn, which she uses sparingly due to kidney function concerns.  She has not seen her kidney doctor recently. She received her Jubonti injection today.      Health Maintenance  Due  Topic Date Due   COVID-19 Vaccine (6 - 2025-26 season) 03/10/2024    Past Medical History:  Diagnosis Date   Allergic rhinitis    Allergy    Ankle fracture    Left, Mortise Widening   Anxiety 2020   Arthritis    knuckles (06/17/2012)   Borderline diabetes mellitus 03/06/2011   Breast cancer (HCC)    right (06/17/2012)   Cataracts, bilateral    Depression 2020   Displaced fracture of distal end of left fibula 07/30/2017   Dyspnea    with exertion   Exertional dyspnea    GERD (gastroesophageal reflux disease)    Hypertension    Iron deficiency anemia    hematologist watches it (06/17/2012)   Obesity    OSA treated with BiPAP 03/08/2016   Osteoarthritis    severe right hip osteoarthritis-s/p hip replacement   Osteoporosis    Thyroid  disorder    sees endocrinologist yearly (06/17/2012)    Past Surgical History:  Procedure Laterality Date   ANKLE FRACTURE SURGERY Right 01/01/2014   Has pins. Procedure performed at [facility]   BREAST BIOPSY  ` 1992   bilaterlly (06/17/2012)   COLONOSCOPY W/ POLYPECTOMY     EYE SURGERY  2011   cataract removal both eyes   FRACTURE SURGERY     HIP CLOSED REDUCTION  07/26/2012   Procedure: CLOSED MANIPULATION HIP;  Surgeon: Eva Elsie Herring, MD;  Location: [facility];  Service: Orthopedics;  Laterality: Right;   JOINT REPLACEMENT  2007   LUMBAR LAMINECTOMY  10/26/2017   LUMBAR LAMINECTOMY/DECOMPRESSION  MICRODISCECTOMY Bilateral 10/25/2017   Procedure: LUMBAR 2-3, LUMBAR 3-4 DECOMPRESSION TIME REQUESTED 4.5 HOURS;  Surgeon: Beuford Anes, MD;  Location: [facility] OR;  Service: Orthopedics;  Laterality: Bilateral;   MASTECTOMY  1993?   bilateral mastectomy   ORIF ANKLE FRACTURE Left 07/30/2017   Procedure: LEFT OPEN REDUCTION INTERNAL FIXATION (ORIF) ANKLE FRACTURE WITH SYDESMOTIC FIXATION;  Surgeon: Yvone Rush, MD;  Location: [facility] OR;  Service: Orthopedics;  Laterality: Left;   SPINE SURGERY     TONSILLECTOMY  1949    TOTAL HIP ARTHROPLASTY  04/2006   right hip    TOTAL HIP REVISION  06/17/2012   right (06/17/2012)   TOTAL HIP REVISION  06/17/2012   Procedure: TOTAL HIP REVISION;  Surgeon: Dempsey JINNY Sensor, MD;  Location: [facility];  Service: Orthopedics;  Laterality: Right;    Family History  Problem Relation Age of Onset   Heart failure Father        deceased age 15   Diabetes Father    Alcohol abuse Father    Hypertension Father    Breast cancer Mother        deceased at age 53 secondary to breast cancer   Heart disease Mother        Died of complications from breast cancer.   Cancer Mother    Early death Mother    Liver disease Sister        Liver failure--deceased   Heart disease Sister        Died of congestive heart failure and liver failure.   Alcohol abuse Sister    Dementia Maternal Grandmother    Colon cancer Neg Hx    Esophageal cancer Neg Hx    Rectal cancer Neg Hx    Stomach cancer Neg Hx     Social History   Socioeconomic History   Marital status: Widowed    Spouse name: Not on file   Number of children: Not on file   Years of education: Not on file   Highest education level: Master's degree (e.g., MA, MS, MEng, MEd, MSW, MBA)  Occupational History   Not on file  Tobacco Use   Smoking status: Former    Current packs/day: 0.00    Average packs/day: 0.5 packs/day for 10.0 years (5.0 ttl pk-yrs)    Types: Cigarettes    Start date: 07/14/1964    Quit date: 07/14/1974    Years since quitting: 49.9   Smokeless tobacco: Never   Tobacco comments:    Former smoker 05/21/24  Vaping Use   Vaping status: Never Used  Substance and Sexual Activity   Alcohol use: Yes    Alcohol/week: 4.0 standard drinks of alcohol    Types: 4 Glasses of wine per week    Comment: Glass of wine once in awhile.   Drug use: No   Sexual activity: Not Currently    Birth control/protection: Abstinence, None    Comment: Too old  Other Topics Concern   Not on file  Social History Narrative    Retired   The patient is married and has one child and two grandchildren that live in the area.     She drinks wine with dinner.  She quit smoking in 1976, smoked for   approximately 10 years.       Moved to [place] from [place], [place]         Social Drivers of Health   Financial Resource Strain: Low Risk  (05/21/2024)   Overall Financial Resource Strain (CARDIA)  Difficulty of Paying Living Expenses: Not hard at all  Food Insecurity: No Food Insecurity (05/21/2024)   Hunger Vital Sign    Worried About Running Out of Food in the Last Year: Never true    Ran Out of Food in the Last Year: Never true  Transportation Needs: No Transportation Needs (05/21/2024)   PRAPARE - Administrator, Civil Service (Medical): No    Lack of Transportation (Non-Medical): No  Physical Activity: Inactive (05/21/2024)   Exercise Vital Sign    Days of Exercise per Week: 0 days    Minutes of Exercise per Session: Not on file  Stress: No Stress Concern Present (05/21/2024)   [employer] of Occupational Health - Occupational Stress Questionnaire    Feeling of Stress: Only a little  Social Connections: Socially Isolated (05/21/2024)   Social Connection and Isolation Panel    Frequency of Communication with Friends and Family: Twice a week    Frequency of Social Gatherings with Friends and Family: More than three times a week    Attends Religious Services: Never    Database Administrator or Organizations: No    Attends Banker Meetings: Not on file    Marital Status: Widowed  Intimate Partner Violence: Not At Risk (12/06/2023)   Humiliation, Afraid, Rape, and Kick questionnaire    Fear of Current or Ex-Partner: No    Emotionally Abused: No    Physically Abused: No    Sexually Abused: No    Outpatient Medications Prior to Visit  Medication Sig Dispense Refill   acetaminophen  (TYLENOL ) 500 MG tablet Take 1,000 mg by mouth in the morning, at noon, and at bedtime. Take  with Gabapetin     amiodarone (PACERONE) 200 MG tablet Take 1 tablet (200 mg total) by mouth 2 (two) times daily for 30 days, THEN 1 tablet (200 mg total) daily. 240 tablet 0   cholecalciferol  (VITAMIN D3) 25 MCG (1000 UNIT) tablet Take 1 tablet (1,000 Units total) by mouth daily. 90 tablet 1   CRANBERRY PO Take 1 capsule by mouth daily.     cyanocobalamin  (VITAMIN B12) 1000 MCG tablet Take 1 tablet (1,000 mcg total) by mouth daily.     denosumab -bbdz (JUBBONTI ) 60 MG/ML SOSY Inject 60 mg into the skin every 6 (six) months. 1 mL 0   diltiazem  (CARDIZEM  CD) 180 MG 24 hr capsule Take 1 capsule (180 mg total) by mouth daily. 90 capsule 3   ELIQUIS  2.5 MG TABS tablet Take 1 tablet (2.5 mg total) by mouth 2 (two) times daily. 60 tablet 5   fexofenadine (ALLEGRA) 180 MG tablet Take 180 mg by mouth daily as needed. For seasonal allergies     FLUoxetine  (PROZAC ) 10 MG capsule TAKE 1 CAPSULE BY MOUTH AT  BEDTIME 90 capsule 3   fluticasone  (FLONASE ) 50 MCG/ACT nasal spray USE 1 SPRAY IN EACH NOSTRIL EVERY DAY 32 g 1   gabapentin  (NEURONTIN ) 100 MG capsule Take 1 capsule (100 mg total) by mouth 3 (three) times daily. 270 capsule 1   hydrALAZINE  (APRESOLINE ) 25 MG tablet Take 1 tablet (25 mg total) by mouth in the morning and at bedtime. 180 tablet 1   ketotifen (ZADITOR) 0.025 % ophthalmic solution Place 1 drop into both eyes 2 (two) times daily as needed.     Omega-3 Fatty Acids (FISH OIL) 600 MG CAPS Take 2 capsules by mouth daily.     potassium chloride  SA (KLOR-CON  M20) 20 MEQ tablet Take 1 tablet (20  mEq total) by mouth daily. 90 tablet 1   simvastatin  (ZOCOR ) 10 MG tablet Take 1 tablet (10 mg total) by mouth daily. 90 tablet 1   torsemide  (DEMADEX ) 20 MG tablet TAKE 2 TABLETS (40 MG TOTAL) BY MOUTH 2 (TWO) TIMES DAILY. TAKE 40 MG DAILY. MAY ADD AN ADDITIONAL 20 MG ON THE DAYS OF SWELLING, WEIGHT GAIN OR SOB. 405 tablet 0   bisoprolol  (ZEBETA ) 10 MG tablet Take 20 mg by mouth daily.     No  facility-administered medications prior to visit.    Allergies  Allergen Reactions   Amlodipine  Swelling    Swelling    Sulfonamide Derivatives Swelling    REACTION: Swelling of the face; eyes swelled shut   Myrbetriq  [Mirabegron ]     Facial swelling, sob   Chocolate Other (See Comments)    Sinus problems    ROS    See HPI Objective:    Physical Exam Constitutional:      General: She is not in acute distress.    Appearance: Normal appearance. She is well-developed.  HENT:     Head: Normocephalic and atraumatic.     Right Ear: External ear normal.     Left Ear: External ear normal.  Eyes:     General: No scleral icterus. Neck:     Thyroid : No thyromegaly.  Cardiovascular:     Rate and Rhythm: Regular rhythm. Bradycardia present.     Heart sounds: Normal heart sounds. No murmur heard. Pulmonary:     Effort: Pulmonary effort is normal. No respiratory distress.     Breath sounds: Normal breath sounds. No wheezing.  Musculoskeletal:        General: Swelling (3+ bilateral LE edema) present.     Cervical back: Neck supple.  Skin:    General: Skin is warm and dry.  Neurological:     Mental Status: She is alert and oriented to person, place, and time.  Psychiatric:        Mood and Affect: Mood normal.        Behavior: Behavior normal.        Thought Content: Thought content normal.        Judgment: Judgment normal.      BP (!) 108/48 (BP Location: Right Arm, Patient Position: Sitting, Cuff Size: Large)   Pulse (!) 52   Temp (!) 97.4 F (36.3 C) (Oral)   Resp 16   Ht 5' 4 (1.626 m)   Wt 216 lb 6.4 oz (98.2 kg)   LMP 07/11/1991   SpO2 99%   BMI 37.14 kg/m  Wt Readings from Last 3 Encounters:  05/28/24 216 lb 6.4 oz (98.2 kg)  05/21/24 212 lb 12.8 oz (96.5 kg)  04/14/24 213 lb (96.6 kg)       Assessment & Plan:   Problem List Items Addressed This Visit       Unprioritized   Seasonal allergies   Stable on allegra.  Not using flonase  currently.        Osteoporosis   Jubonti injection today.       Relevant Medications   denosumab -bbdz (JUBBONTI ) injection 60 mg (Start on 11/19/2024 12:00 AM)   Hyperlipemia   Lab Results  Component Value Date   CHOL 149 11/27/2023   HDL 71.70 11/27/2023   LDLCALC 63 11/27/2023   LDLDIRECT 110.4 12/13/2006   TRIG 69.0 11/27/2023   CHOLHDL 2 11/27/2023   Continues simvastatin .       Relevant Medications   bisoprolol  (ZEBETA ) 10  MG tablet   GERD (gastroesophageal reflux disease)   Stable with rare dose of famotidine .       Essential hypertension - Primary   Relevant Medications   bisoprolol  (ZEBETA ) 10 MG tablet   Depression   Mood is stable on prozac .  Continue same.        CKD (chronic kidney disease) stage 4, GFR 15-29 ml/min (HCC)   Has not scheduled with Nephrology- agrees to arrange follow up.       Relevant Orders   Basic Metabolic Panel (BMET)   Other Visit Diagnoses       Needs flu shot       Relevant Orders   Flu vaccine HIGH DOSE PF(Fluzone Trivalent) (Completed)       I have changed Tanairy Robles Jackie's bisoprolol . I am also having her maintain her fexofenadine, fluticasone , Fish Oil, acetaminophen , ketotifen, FLUoxetine , cyanocobalamin , torsemide , Jubbonti , gabapentin , simvastatin , potassium chloride  SA, hydrALAZINE , diltiazem , CRANBERRY PO, cholecalciferol , Eliquis , and amiodarone . We administered denosumab -bbdz. We will continue to administer denosumab -bbdz.  Meds ordered this encounter  Medications   bisoprolol  (ZEBETA ) 10 MG tablet    Sig: Take 1 tablet (10 mg total) by mouth daily.    Dispense:  90 tablet    Refill:  1    Supervising Provider:   DOMENICA BLACKBIRD A [[id number]]   denosumab -bbdz (JUBBONTI ) injection 60 mg    Patient is enrolled in [facility] program for this medication and I have provided a copy of the denosumab  Medication Guide and Patient Brochure.:   No    I have reviewed with the patient the information in the denosumab  Medication Guide and  Patient Counseling Chart including the serious risks and symptoms of each risk.:   Yes    I have advised the patient to seek medical attention if they have signs or symptoms of any of the serious risks.:   Yes

## 2024-05-29 ENCOUNTER — Other Ambulatory Visit: Payer: Self-pay

## 2024-05-29 MED ORDER — POTASSIUM CHLORIDE CRYS ER 20 MEQ PO TBCR: 20.0000 meq | EXTENDED_RELEASE_TABLET | Freq: Every day | ORAL | 1 refills | Status: AC

## 2024-06-02 ENCOUNTER — Other Ambulatory Visit: Payer: Self-pay

## 2024-06-03 ENCOUNTER — Telehealth: Payer: Self-pay | Admitting: *Deleted

## 2024-06-03 NOTE — Telephone Encounter (Signed)
 Left message to call back to schedule January afib ablation    (NO CT needed, using Carto)

## 2024-06-10 ENCOUNTER — Ambulatory Visit (HOSPITAL_COMMUNITY)
Admission: RE | Admit: 2024-06-10 | Discharge: 2024-06-10 | Disposition: A | Discharge status: Home / Self Care | Source: Ambulatory Visit | Attending: Internal Medicine | Admitting: Internal Medicine

## 2024-06-10 DIAGNOSIS — Z5181 Encounter for therapeutic drug level monitoring: Secondary | ICD-10-CM

## 2024-06-10 DIAGNOSIS — D6869 Other thrombophilia: Secondary | ICD-10-CM

## 2024-06-10 DIAGNOSIS — I4819 Other persistent atrial fibrillation: Secondary | ICD-10-CM | POA: Diagnosis not present

## 2024-06-10 NOTE — Progress Notes (Signed)
 Patient began amiodarone  load on 05/21/2024.  She she notes to feel little lightheaded at times.   ECG today shows  EKG Interpretation Date/Time:  Tuesday June 10 2024 08:52:27 EST Ventricular Rate:  54 PR Interval:  220 QRS Duration:  106 QT Interval:  456 QTC Calculation: 432 R Axis:   4  Text Interpretation: Sinus bradycardia with 1st degree A-V block Nonspecific ST abnormality Abnormal ECG When compared with ECG of 21-May-2024 09:01, Sinus rhythm has replaced Atrial flutter Confirmed by Terra Pac ([id number]) on 06/10/2024 9:42:11 AM     Patient is still interested in being scheduled for ablation but has not heard anything back about scheduling procedure.  We will try and coordinate with Dr. Carolyn staff to help with this.  Patient will continue amiodarone  load and transition to once daily amiodarone  as prescribed.

## 2024-06-11 ENCOUNTER — Ambulatory Visit (HOSPITAL_COMMUNITY): Admitting: Internal Medicine

## 2024-06-22 ENCOUNTER — Other Ambulatory Visit (HOSPITAL_COMMUNITY): Payer: Self-pay

## 2024-06-26 ENCOUNTER — Telehealth: Payer: Self-pay | Admitting: *Deleted

## 2024-06-26 DIAGNOSIS — Z01812 Encounter for preprocedural laboratory examination: Secondary | ICD-10-CM

## 2024-06-26 DIAGNOSIS — I4819 Other persistent atrial fibrillation: Secondary | ICD-10-CM

## 2024-06-26 NOTE — Telephone Encounter (Signed)
 Left message for pt to call back to schedule AFib ablation with Dr. Inocencio  (Received message from afib clinic 12/2 pt would like to proceed with procedure)

## 2024-07-01 ENCOUNTER — Other Ambulatory Visit: Payer: Self-pay

## 2024-07-16 NOTE — Telephone Encounter (Signed)
Patient returned call please advise

## 2024-07-16 NOTE — Telephone Encounter (Signed)
 Confirmed with pt that we spoke right before the Christmas holiday and agreed to 2/17 ablation.   She confirms.  Informed pt that I did not document the call for some reason which is why I called to confirm. She is aware we will send procedure instructions via mychart. She is agreeable to plan.

## 2024-07-28 ENCOUNTER — Other Ambulatory Visit: Payer: Self-pay | Admitting: Internal Medicine

## 2024-07-28 DIAGNOSIS — R609 Edema, unspecified: Secondary | ICD-10-CM

## 2024-07-29 ENCOUNTER — Encounter (HOSPITAL_COMMUNITY): Payer: Self-pay

## 2024-07-29 ENCOUNTER — Telehealth: Payer: Self-pay

## 2024-07-29 ENCOUNTER — Telehealth (HOSPITAL_COMMUNITY): Payer: Self-pay

## 2024-07-29 NOTE — Telephone Encounter (Signed)
 Attempted to reach patient to discuss upcoming procedure, no answer. Left VM for patient to return call.

## 2024-07-29 NOTE — Telephone Encounter (Signed)
 Spoke with patient to complete pre-procedure call.     Health status review:  Any new medical conditions, recent signs of acute illness or been started on antibiotics? No Any recent hospitalizations or surgeries? No Any new medications started since pre-op visit? No  Follow all medication instructions prior to procedure or the procedure may be rescheduled:    Continue taking Eliquis  (Apixaban ) twice daily without missing any doses before procedure. Essential chronic medications:  No medication should be continued, unless told otherwise. On the morning of your procedure DO NOT take any medication., including Eliquis  (Apixaban ).  Nothing to eat or drink after midnight prior to your procedure.  Pre-procedure testing scheduled: CT not required and lab work by  February 3.  Confirmed patient is scheduled for Atrial Fibrillation Ablation on Tuesday, February 17 with Dr. Inocencio. Instructed patient to arrive at the Main Entrance A at [facility]: [address] [place], [place] [address] and check in at Admitting at 5:30 AM.  Plan to go home the same day, you will only stay overnight if medically necessary. You MUST have a responsible adult to drive you home and MUST be with you the first 24 hours after you arrive home or your procedure could be cancelled.  Informed a nurse may call a day before the procedure to confirm arrival time and ensure instructions are followed.  Patient verbalized understanding to information provided and is agreeable to proceed with procedure.   Advised to contact RN Navigator at [phone], to inform of any new medications started after call or concerns prior to procedure.

## 2024-07-29 NOTE — Telephone Encounter (Signed)
-----   Message from Nurse Maeola SQUIBB, RN sent at 07/18/2024  7:48 AM EST ----- Regarding: 08/26/24  AFib ablation Precert:  MD: Camnitz Type of ablation: A-fib Diagnosis: tachycardia CPT code: A-fib (06343) Ablation scheduled (date/time): 08/26/24  7:30 am  Procedure:  Added to calendar? Yes Orders entered? Yes Letter complete? No, >30 days before procedure Scheduled with cath lab? Yes Any medications to hold? Yes (please list hold instructions): routine Labs ordered (CBC, BMET, PT/INR if on warfarin): Yes Mapping system: CARTO (lab 4 or 6) CARTO/OPAL rep notified? Yes Cardiac CT needed? No Dye allergy? N/a Pre-meds ordered and instructions given? N/a Letter method: MyChart H&P: 05/21/24 Device: No  Follow-up:  Cassie/Angel, please schedule Routine.

## 2024-07-30 ENCOUNTER — Other Ambulatory Visit (HOSPITAL_COMMUNITY): Payer: Self-pay

## 2024-07-30 ENCOUNTER — Other Ambulatory Visit: Payer: Self-pay

## 2024-07-31 LAB — BASIC METABOLIC PANEL WITH GFR
BUN/Creatinine Ratio: 18 (ref 12–28)
BUN: 29 mg/dL — ABNORMAL HIGH (ref 8–27)
CO2: 19 mmol/L — ABNORMAL LOW (ref 20–29)
Calcium: 9.9 mg/dL (ref 8.7–10.3)
Chloride: 107 mmol/L — ABNORMAL HIGH (ref 96–106)
Creatinine, Ser: 1.62 mg/dL — ABNORMAL HIGH (ref 0.57–1.00)
Glucose: 88 mg/dL (ref 70–99)
Potassium: 4.3 mmol/L (ref 3.5–5.2)
Sodium: 143 mmol/L (ref 134–144)
eGFR: 32 mL/min/1.73 — ABNORMAL LOW

## 2024-07-31 LAB — CBC
Hematocrit: 32.6 % — ABNORMAL LOW (ref 34.0–46.6)
Hemoglobin: 10.4 g/dL — ABNORMAL LOW (ref 11.1–15.9)
MCH: 32.6 pg (ref 26.6–33.0)
MCHC: 31.9 g/dL (ref 31.5–35.7)
MCV: 102 fL — ABNORMAL HIGH (ref 79–97)
Platelets: 216 x10E3/uL (ref 150–450)
RBC: 3.19 x10E6/uL — ABNORMAL LOW (ref 3.77–5.28)
RDW: 13 % (ref 11.7–15.4)
WBC: 7.2 x10E3/uL (ref 3.4–10.8)

## 2024-07-31 NOTE — Telephone Encounter (Signed)
 Labs outside of Normal Range  In accordance with refill protocols, please review and address the following requirements before this medication refill can be authorized:  Labs  and Vital signs

## 2024-08-03 ENCOUNTER — Other Ambulatory Visit (HOSPITAL_COMMUNITY): Payer: Self-pay

## 2024-08-04 ENCOUNTER — Other Ambulatory Visit (HOSPITAL_COMMUNITY): Payer: Self-pay

## 2024-08-05 ENCOUNTER — Other Ambulatory Visit: Payer: Self-pay

## 2024-08-06 ENCOUNTER — Other Ambulatory Visit: Payer: Self-pay

## 2024-08-10 ENCOUNTER — Other Ambulatory Visit (HOSPITAL_COMMUNITY): Payer: Self-pay

## 2024-08-11 ENCOUNTER — Other Ambulatory Visit: Payer: Self-pay | Admitting: Family

## 2024-08-11 ENCOUNTER — Other Ambulatory Visit (HOSPITAL_COMMUNITY): Payer: Self-pay

## 2024-08-11 ENCOUNTER — Other Ambulatory Visit: Payer: Self-pay

## 2024-08-11 DIAGNOSIS — E785 Hyperlipidemia, unspecified: Secondary | ICD-10-CM

## 2024-08-15 ENCOUNTER — Other Ambulatory Visit: Payer: Self-pay

## 2024-08-15 ENCOUNTER — Encounter (HOSPITAL_COMMUNITY): Payer: Self-pay | Admitting: Emergency Medicine

## 2024-08-15 ENCOUNTER — Observation Stay (HOSPITAL_COMMUNITY)
Admission: EM | Admit: 2024-08-15 | Source: Home / Self Care | Attending: Emergency Medicine | Admitting: Emergency Medicine

## 2024-08-15 DIAGNOSIS — I959 Hypotension, unspecified: Secondary | ICD-10-CM

## 2024-08-15 DIAGNOSIS — R001 Bradycardia, unspecified: Principal | ICD-10-CM

## 2024-08-15 DIAGNOSIS — I4891 Unspecified atrial fibrillation: Secondary | ICD-10-CM | POA: Diagnosis present

## 2024-08-15 LAB — BASIC METABOLIC PANEL WITH GFR
Anion gap: 15 (ref 5–15)
BUN: 46 mg/dL — ABNORMAL HIGH (ref 8–23)
CO2: 18 mmol/L — ABNORMAL LOW (ref 22–32)
Calcium: 8.7 mg/dL — ABNORMAL LOW (ref 8.9–10.3)
Chloride: 106 mmol/L (ref 98–111)
Creatinine, Ser: 2.27 mg/dL — ABNORMAL HIGH (ref 0.44–1.00)
GFR, Estimated: 21 mL/min — ABNORMAL LOW
Glucose, Bld: 136 mg/dL — ABNORMAL HIGH (ref 70–99)
Potassium: 5.5 mmol/L — ABNORMAL HIGH (ref 3.5–5.1)
Sodium: 138 mmol/L (ref 135–145)

## 2024-08-15 LAB — CBC
HCT: 32.9 % — ABNORMAL LOW (ref 36.0–46.0)
Hemoglobin: 10.9 g/dL — ABNORMAL LOW (ref 12.0–15.0)
MCH: 33.6 pg (ref 26.0–34.0)
MCHC: 33.1 g/dL (ref 30.0–36.0)
MCV: 101.5 fL — ABNORMAL HIGH (ref 80.0–100.0)
Platelets: 179 10*3/uL (ref 150–400)
RBC: 3.24 MIL/uL — ABNORMAL LOW (ref 3.87–5.11)
RDW: 14.3 % (ref 11.5–15.5)
WBC: 7.2 10*3/uL (ref 4.0–10.5)
nRBC: 0 % (ref 0.0–0.2)

## 2024-08-15 LAB — MAGNESIUM: Magnesium: 2.6 mg/dL — ABNORMAL HIGH (ref 1.7–2.4)

## 2024-08-15 LAB — TROPONIN T, HIGH SENSITIVITY
Troponin T High Sensitivity: 40 ng/L — ABNORMAL HIGH (ref 0–19)
Troponin T High Sensitivity: 38 ng/L — ABNORMAL HIGH (ref 0–19)

## 2024-08-15 MED ORDER — SODIUM CHLORIDE 0.9% FLUSH: 3.0000 mL | INTRAVENOUS | Status: AC | PRN

## 2024-08-15 MED ORDER — AMIODARONE HCL 200 MG PO TABS: 200.0000 mg | ORAL_TABLET | Freq: Every day | ORAL | Status: AC

## 2024-08-15 MED ORDER — ACETAMINOPHEN 500 MG PO TABS: 1000.0000 mg | ORAL_TABLET | Freq: Three times a day (TID) | ORAL | Status: AC | PRN

## 2024-08-15 MED ORDER — SODIUM CHLORIDE 0.9% FLUSH
3.0000 mL | Freq: Two times a day (BID) | INTRAVENOUS | Status: AC
  Administered 2024-08-15: 3 mL via INTRAVENOUS

## 2024-08-15 MED ORDER — GABAPENTIN 100 MG PO CAPS: 100.0000 mg | ORAL_CAPSULE | Freq: Three times a day (TID) | ORAL | Status: AC

## 2024-08-15 MED ORDER — SODIUM CHLORIDE 0.9 % IV BOLUS
1000.0000 mL | Freq: Once | INTRAVENOUS | Status: AC
  Administered 2024-08-15: 1000 mL via INTRAVENOUS

## 2024-08-15 MED ORDER — FLUTICASONE PROPIONATE 50 MCG/ACT NA SUSP
1.0000 | Freq: Every day | NASAL | Status: AC
  Filled 2024-08-15: qty 16

## 2024-08-15 MED ORDER — KETOTIFEN FUMARATE 0.035 % OP SOLN: 1.0000 [drp] | Freq: Two times a day (BID) | OPHTHALMIC | Status: AC | PRN

## 2024-08-15 MED ORDER — SIMVASTATIN 20 MG PO TABS: 10.0000 mg | ORAL_TABLET | Freq: Every day | ORAL | Status: AC

## 2024-08-15 MED ORDER — ATROPINE SULFATE 1 MG/10ML IJ SOSY
1.0000 mg | PREFILLED_SYRINGE | Freq: Once | INTRAMUSCULAR | Status: AC
  Administered 2024-08-15: 1 mg via INTRAVENOUS
  Filled 2024-08-15: qty 10

## 2024-08-15 MED ORDER — APIXABAN 2.5 MG PO TABS: 2.5000 mg | ORAL_TABLET | Freq: Two times a day (BID) | ORAL | Status: AC

## 2024-08-15 MED ORDER — TORSEMIDE 20 MG PO TABS: 40.0000 mg | ORAL_TABLET | Freq: Two times a day (BID) | ORAL | Status: AC

## 2024-08-15 MED ORDER — SODIUM CHLORIDE 0.9 % IV SOLN: 250.0000 mL | INTRAVENOUS | Status: AC | PRN

## 2024-08-15 MED ORDER — FLUOXETINE HCL 10 MG PO CAPS
10.0000 mg | ORAL_CAPSULE | Freq: Every day | ORAL | Status: AC
  Filled 2024-08-15: qty 1

## 2024-08-15 MED ORDER — OMEGA-3-ACID ETHYL ESTERS 1 G PO CAPS: 1000.0000 mg | ORAL_CAPSULE | Freq: Every day | ORAL | Status: AC

## 2024-08-15 NOTE — ED Triage Notes (Signed)
 Patient arrives via ems from home for dizziness and lower back pain that's started today. EMS found patient in complete heart block with rate in the 30's. SBP initially in the 80's. 6L NS given - repeat SBP in the 90's. Hx of afib.

## 2024-08-15 NOTE — ED Notes (Signed)
Pacer pads placed per MD request

## 2024-08-15 NOTE — ED Provider Notes (Signed)
 " [facility] Provider Note   CSN: [id number] Arrival date & time: 08/15/24  2104     Patient presents with: Dizziness   [name] is a 84 y.o. female with a history of hypertension, diastolic heart failure, A-fib, presented to the ED with dizziness and low heart rate.  Patient ports many feel very dizzy and lightheaded around 4 PM this afternoon.  EMS reports the patient was in ventricular bradycardia, blood pressure was 90s over 70s.  I reviewed the patient's outpatient evaluations including October 2025 by the EP physician.  She has a known history of persistent atrial fibrillation or flutter.  She is currently on amiodarone , Eliquis , diltiazem , also hydralazine  for high blood pressure, and torsemide .   HPI     Prior to Admission medications  Medication Sig Start Date End Date Taking? Authorizing Provider  acetaminophen  (TYLENOL ) 500 MG tablet Take 1,000 mg by mouth in the morning, at noon, and at bedtime. Take with Gabapetin 10/03/20   [provider]  amiodarone  (PACERONE ) 200 MG tablet Take 1 tablet (200 mg total) by mouth 2 (two) times daily for 30 days, THEN 1 tablet (200 mg total) daily. 05/21/24 12/17/24  Terra Fairy PARAS, PA-C  bisoprolol  (ZEBETA ) 10 MG tablet Take 1 tablet (10 mg total) by mouth daily. 05/28/24   Daryl Setter, NP  cholecalciferol  (VITAMIN D3) 25 MCG (1000 UNIT) tablet Take 1 tablet (1,000 Units total) by mouth daily. 04/29/24   O'Sullivan, [name], NP  CRANBERRY PO Take 1 capsule by mouth daily.    [provider]  cyanocobalamin  (VITAMIN B12) 1000 MCG tablet Take 1 tablet (1,000 mcg total) by mouth daily. 11/28/23   O'Sullivan, [name], NP  denosumab -bernett (JUBBONTI ) 60 MG/ML SOSY Inject 60 mg into the skin every 6 (six) months. 03/11/24   O'Sullivan, [name], NP  diltiazem  (CARDIZEM  CD) 180 MG 24 hr capsule Take 1 capsule (180 mg total) by mouth daily. 04/10/24 10/07/24  Lucien Orren SAILOR, PA-C   ELIQUIS  2.5 MG TABS tablet Take 1 tablet (2.5 mg total) by mouth 2 (two) times daily. 05/04/24   O'Sullivan, [name], NP  fexofenadine (ALLEGRA) 180 MG tablet Take 180 mg by mouth daily as needed. For seasonal allergies    [provider]  FLUoxetine  (PROZAC ) 10 MG capsule TAKE 1 CAPSULE BY MOUTH AT  BEDTIME 09/05/23   O'Sullivan, [name], NP  fluticasone  (FLONASE ) 50 MCG/ACT nasal spray USE 1 SPRAY IN EACH NOSTRIL EVERY DAY 04/18/17   O'Sullivan, [name], NP  gabapentin  (NEURONTIN ) 100 MG capsule Take 1 capsule (100 mg total) by mouth 3 (three) times daily. 03/14/24   O'Sullivan, [name], NP  hydrALAZINE  (APRESOLINE ) 25 MG tablet Take 1 tablet (25 mg total) by mouth in the morning and at bedtime. 04/10/24   O'Sullivan, [name], NP  ketotifen  (ZADITOR ) 0.025 % ophthalmic solution Place 1 drop into both eyes 2 (two) times daily as needed.    [provider]  Omega-3 Fatty Acids (FISH OIL) 600 MG CAPS Take 2 capsules by mouth daily.    [provider]  potassium chloride  SA (KLOR-CON  M20) 20 MEQ tablet Take 1 tablet (20 mEq total) by mouth daily. 05/29/24   O'Sullivan, [name], NP  simvastatin  (ZOCOR ) 10 MG tablet TAKE 1 TABLET BY MOUTH DAILY 08/12/24   O'Sullivan, [name], NP  torsemide  (DEMADEX ) 20 MG tablet TAKE 2 TABLETS (40 MG TOTAL) BY MOUTH 2 (TWO) TIMES DAILY. TAKE 40 MG DAILY. MAY ADD AN ADDITIONAL 20 MG ON THE DAYS  OF SWELLING, WEIGHT GAIN OR SHORTNESS OF BREATH. 07/31/24   Hilty, Vinie BROCKS, MD    Allergies: Amlodipine , Sulfonamide derivatives, Myrbetriq  [mirabegron ], and Chocolate    Review of Systems  Updated Vital Signs BP (!) 93/58   Pulse (!) 32   Temp 97.8 F (36.6 C) (Oral)   Resp 14   Ht 5' 4 (1.626 m)   Wt 99.8 kg   LMP 07/11/1991   SpO2 98%   BMI 37.76 kg/m   Physical Exam Constitutional:      General: She is not in acute distress. HENT:     Head: Normocephalic and atraumatic.  Eyes:     Conjunctiva/sclera: Conjunctivae normal.     Pupils:  Pupils are equal, round, and reactive to light.  Cardiovascular:     Rate and Rhythm: Bradycardia present. Rhythm irregular.  Pulmonary:     Effort: Pulmonary effort is normal. No respiratory distress.  Abdominal:     General: There is no distension.     Tenderness: There is no abdominal tenderness.  Skin:    General: Skin is warm and dry.  Neurological:     General: No focal deficit present.     Mental Status: She is alert. Mental status is at baseline.  Psychiatric:        Mood and Affect: Mood normal.        Behavior: Behavior normal.     (all labs ordered are listed, but only abnormal results are displayed) Labs Reviewed  BASIC METABOLIC PANEL WITH GFR - Abnormal; Notable for the following components:      Result Value   Potassium 5.5 (*)    CO2 18 (*)    Glucose, Bld 136 (*)    BUN 46 (*)    Creatinine, Ser 2.27 (*)    Calcium 8.7 (*)    GFR, Estimated 21 (*)    All other components within normal limits  CBC - Abnormal; Notable for the following components:   RBC 3.24 (*)    Hemoglobin 10.9 (*)    HCT 32.9 (*)    MCV 101.5 (*)    All other components within normal limits  MAGNESIUM  - Abnormal; Notable for the following components:   Magnesium  2.6 (*)    All other components within normal limits  TROPONIN T, HIGH SENSITIVITY - Abnormal; Notable for the following components:   Troponin T High Sensitivity 40 (*)    All other components within normal limits  TROPONIN T, HIGH SENSITIVITY    EKG: None  Radiology: No results found.   .Critical Care  Performed by: Cottie Donnice PARAS, MD Authorized by: Cottie Donnice PARAS, MD   Critical care provider statement:    Critical care time (minutes):  30   Critical care time was exclusive of:  Separately billable procedures and treating other patients   Critical care was necessary to treat or prevent imminent or life-threatening deterioration of the following conditions:  Circulatory failure   Critical care was time  spent personally by me on the following activities:  Ordering and performing treatments and interventions, ordering and review of laboratory studies, ordering and review of radiographic studies, pulse oximetry, review of old charts, examination of patient and evaluation of patient's response to treatment    Medications Ordered in the ED  atropine  1 MG/10ML injection 1 mg (1 mg Intravenous Given 08/15/24 2144)  sodium chloride  0.9 % bolus 1,000 mL (1,000 mLs Intravenous New Bag/Given 08/15/24 2143)  atropine  1 MG/10ML injection 1 mg (1 mg  Intravenous Given 08/15/24 2300)    Clinical Course as of 08/15/24 2310  Fri Aug 15, 2024  2138 I paged cardiology Dr Lea [MT]  2222 Cardiology consult placed [MT]  2304 Cardiologist Dr Lea to admit [MT]    Clinical Course User Index [MT] Carlota Philley, Donnice PARAS, MD                                 Medical Decision Making Amount and/or Complexity of Data Reviewed Labs: ordered.  Risk Prescription drug management.   This patient presents to the ED with concern for bradycardia, lightheadedness. This involves an extensive number of treatment options, and is a complaint that carries with it a high risk of complications and morbidity.  The differential diagnosis includes junctional bradycardia versus heart block versus bradycardic atrial fibrillation versus other arrhythmia  Co-morbidities that complicate the patient evaluation: Known history of A-fib, arrhythmia  Additional history obtained from EMS  External records from outside source obtained and reviewed including cardiology office evaluation  I ordered and personally interpreted labs.  The pertinent results include: Some elevation of potassium, creatinine, troponin initially.  Repeat troponin is pending   The patient was maintained on a cardiac monitor.  I personally viewed and interpreted the cardiac monitored which showed an underlying rhythm of: Bradycardia  Per my interpretation the patient's ECG  shows junctional bradycardia or A-fib with slow heart rate, difficult to discern if heart block  I ordered medication including atropine  for borderline hypotension, bradycardia, lightheadedness, symptomatic.  IV fluids for hydration and hypotension  I have reviewed the patients home medicines and have made adjustments as needed  Test Considered: Lower suspicion for acute PE or sepsis  I requested consultation with the cardiology,  and discussed lab and imaging findings as well as pertinent plan - they recommend: Cardiology admission, washout of beta-blockers, continued monitoring of bradycardia and pacing as needed.  After the interventions noted above, I reevaluated the patient and found that they have: improved - MAP maintains above 65 mmhg, not needing vasopressors, will continue to monitor   Disposition:  After consideration of the diagnostic results and the patient's response to treatment, I feel that the patient would benefit from mission.      Final diagnoses:  Bradycardia  Hypotension, unspecified hypotension type    ED Discharge Orders     None          Cottie Donnice PARAS, MD 08/15/24 2310  "

## 2024-08-15 NOTE — H&P (Incomplete)
 "  Cardiology Admission History and Physical   Patient ID: [name] MRN: [id number]; DOB: 03-Jul-1941   Admission date: 08/15/2024  PCP:  Daryl Setter, NP   [facility] Providers Cardiologist:  Vinie JAYSON Maxcy, MD  Electrophysiologist:  Soyla Gladis Norton, MD  { Click here to update MD or APP on Care Team, Refresh:1}    Chief Complaint:  Dizziness and shortness of breath   Patient Profile: [name] is a 84 y.o. female with Persistent atrial fibrillation, HFpEF, chronic lymphedema of lower extremities, obesity, sleep apnea, history of supraventricular tachycardia, CKD, history of hyperparathyroidism, and HTN who is being seen 08/15/2024 for the evaluation of dizziness, bradycardia.  History of Present Illness: Ms. [name] tells me that today she was at home and was in her usual state of health but noted that she was getting suddenly very dizzy and light headed. She also experienced some back pain. She has a neighbor that checks in on her at times and this neighbor came by to see how she was doing. The neighbor saw that she did not look very good and recommended that she come to the emergency room. Strips available from EMS reveal atrial fibrillation with slow ventricular response. Irregular RR intervals. She tells me that she is due for an atrial fibrillation ablation soon and was just started on oral amiodarone  and has been on it for about a month. She additionally takes bisoprolol  and she additionally takes diltiazem . She denies worsening leg swelling however does have swollen legs due to a history of lymphedema. She tells me she does not have a history of heart failure. She denies any recent chest pain or chest pressure. She does not report orthopnea, PND, or any infectious symptoms such as cough, dysuria, nausea, vomiting, diarrhea, or fever.   In the ER she was given Atropine  x2 due to blood pressures around 90/31mmHg with heart rates as low as 22BPM on the  monitor.      Past Medical History:  Diagnosis Date   Allergic rhinitis    Allergy    Ankle fracture    Left, Mortise Widening   Anxiety 2020   Arthritis    knuckles (06/17/2012)   Borderline diabetes mellitus 03/06/2011   Breast cancer (HCC)    right (06/17/2012)   Cataracts, bilateral    Depression 2020   Displaced fracture of distal end of left fibula 07/30/2017   Dyspnea    with exertion   Exertional dyspnea    GERD (gastroesophageal reflux disease)    Hypertension    Iron deficiency anemia    hematologist watches it (06/17/2012)   Obesity    OSA treated with BiPAP 03/08/2016   Osteoarthritis    severe right hip osteoarthritis-s/p hip replacement   Osteoporosis    Thyroid  disorder    sees endocrinologist yearly (06/17/2012)   Past Surgical History:  Procedure Laterality Date   ANKLE FRACTURE SURGERY Right 01/01/2014   Has pins. Procedure performed at [facility]   BREAST BIOPSY  ` 1992   bilaterlly (06/17/2012)   COLONOSCOPY W/ POLYPECTOMY     EYE SURGERY  2011   cataract removal both eyes   FRACTURE SURGERY     HIP CLOSED REDUCTION  07/26/2012   Procedure: CLOSED MANIPULATION HIP;  Surgeon: Eva Elsie Herring, MD;  Location: [facility];  Service: Orthopedics;  Laterality: Right;   JOINT REPLACEMENT  2007   LUMBAR LAMINECTOMY  10/26/2017   LUMBAR LAMINECTOMY/DECOMPRESSION MICRODISCECTOMY Bilateral 10/25/2017   Procedure: LUMBAR 2-3, LUMBAR  3-4 DECOMPRESSION TIME REQUESTED 4.5 HOURS;  Surgeon: Beuford Anes, MD;  Location: [facility] OR;  Service: Orthopedics;  Laterality: Bilateral;   MASTECTOMY  1993?   bilateral mastectomy   ORIF ANKLE FRACTURE Left 07/30/2017   Procedure: LEFT OPEN REDUCTION INTERNAL FIXATION (ORIF) ANKLE FRACTURE WITH SYDESMOTIC FIXATION;  Surgeon: Yvone Rush, MD;  Location: [facility] OR;  Service: Orthopedics;  Laterality: Left;   SPINE SURGERY     TONSILLECTOMY  1949   TOTAL HIP ARTHROPLASTY  04/2006   right hip    TOTAL HIP  REVISION  06/17/2012   right (06/17/2012)   TOTAL HIP REVISION  06/17/2012   Procedure: TOTAL HIP REVISION;  Surgeon: Dempsey JINNY Sensor, MD;  Location: [facility] OR;  Service: Orthopedics;  Laterality: Right;     Medications Prior to Admission: Prior to Admission medications  Medication Sig Start Date End Date Taking? Authorizing Provider  acetaminophen  (TYLENOL ) 500 MG tablet Take 1,000 mg by mouth in the morning, at noon, and at bedtime. Take with Gabapetin 10/03/20   [provider]  amiodarone  (PACERONE ) 200 MG tablet Take 1 tablet (200 mg total) by mouth 2 (two) times daily for 30 days, THEN 1 tablet (200 mg total) daily. 05/21/24 12/17/24  Terra Fairy JINNY, PA-C  bisoprolol  (ZEBETA ) 10 MG tablet Take 1 tablet (10 mg total) by mouth daily. 05/28/24   O'Sullivan, [name], NP  cholecalciferol  (VITAMIN D3) 25 MCG (1000 UNIT) tablet Take 1 tablet (1,000 Units total) by mouth daily. 04/29/24   O'Sullivan, [name], NP  CRANBERRY PO Take 1 capsule by mouth daily.    [provider]  cyanocobalamin  (VITAMIN B12) 1000 MCG tablet Take 1 tablet (1,000 mcg total) by mouth daily. 11/28/23   O'Sullivan, [name], NP  denosumab -bernett (JUBBONTI ) 60 MG/ML SOSY Inject 60 mg into the skin every 6 (six) months. 03/11/24   O'Sullivan, [name], NP  diltiazem  (CARDIZEM  CD) 180 MG 24 hr capsule Take 1 capsule (180 mg total) by mouth daily. 04/10/24 10/07/24  Lucien Orren SAILOR, PA-C  ELIQUIS  2.5 MG TABS tablet Take 1 tablet (2.5 mg total) by mouth 2 (two) times daily. 05/04/24   O'Sullivan, [name], NP  fexofenadine (ALLEGRA) 180 MG tablet Take 180 mg by mouth daily as needed. For seasonal allergies    [provider]  FLUoxetine  (PROZAC ) 10 MG capsule TAKE 1 CAPSULE BY MOUTH AT  BEDTIME 09/05/23   O'Sullivan, [name], NP  fluticasone  (FLONASE ) 50 MCG/ACT nasal spray USE 1 SPRAY IN EACH NOSTRIL EVERY DAY 04/18/17   O'Sullivan, [name], NP  gabapentin  (NEURONTIN ) 100 MG capsule Take 1 capsule (100 mg total) by  mouth 3 (three) times daily. 03/14/24   O'Sullivan, [name], NP  hydrALAZINE  (APRESOLINE ) 25 MG tablet Take 1 tablet (25 mg total) by mouth in the morning and at bedtime. 04/10/24   O'Sullivan, [name], NP  ketotifen  (ZADITOR ) 0.025 % ophthalmic solution Place 1 drop into both eyes 2 (two) times daily as needed.    [provider]  Omega-3 Fatty Acids (FISH OIL) 600 MG CAPS Take 2 capsules by mouth daily.    [provider]  potassium chloride  SA (KLOR-CON  M20) 20 MEQ tablet Take 1 tablet (20 mEq total) by mouth daily. 05/29/24   O'Sullivan, [name], NP  simvastatin  (ZOCOR ) 10 MG tablet TAKE 1 TABLET BY MOUTH DAILY 08/12/24   O'Sullivan, [name], NP  torsemide  (DEMADEX ) 20 MG tablet TAKE 2 TABLETS (40 MG TOTAL) BY MOUTH 2 (TWO) TIMES DAILY. TAKE 40 MG DAILY. MAY ADD AN ADDITIONAL 20 MG ON  THE DAYS OF SWELLING, WEIGHT GAIN OR SHORTNESS OF BREATH. 07/31/24   Hilty, Vinie BROCKS, MD     Allergies:   Allergies[1]  Social History:   Social History   Socioeconomic History   Marital status: Widowed    Spouse name: Not on file   Number of children: Not on file   Years of education: Not on file   Highest education level: Master's degree (e.g., MA, MS, MEng, MEd, MSW, MBA)  Occupational History   Not on file  Tobacco Use   Smoking status: Former    Current packs/day: 0.00    Average packs/day: 0.5 packs/day for 10.0 years (5.0 ttl pk-yrs)    Types: Cigarettes    Start date: 07/14/1964    Quit date: 07/14/1974    Years since quitting: 50.1   Smokeless tobacco: Never   Tobacco comments:    Former smoker 05/21/24  Vaping Use   Vaping status: Never Used  Substance and Sexual Activity   Alcohol use: Yes    Alcohol/week: 4.0 standard drinks of alcohol    Types: 4 Glasses of wine per week    Comment: Glass of wine once in awhile.   Drug use: No   Sexual activity: Not Currently    Birth control/protection: Abstinence, None    Comment: Too old  Other Topics Concern   Not on file   Social History Narrative   Retired   The patient is married and has one child and two grandchildren that live in the area.     She drinks wine with dinner.  She quit smoking in 1976, smoked for   approximately 10 years.       Moved to [place] from [place], [place]         Social Drivers of Health   Tobacco Use: Medium Risk (08/15/2024)   Patient History    Smoking Tobacco Use: Former    Smokeless Tobacco Use: Never    Passive Exposure: Not on file  Financial Resource Strain: Low Risk (05/21/2024)   Overall Financial Resource Strain (CARDIA)    Difficulty of Paying Living Expenses: Not hard at all  Food Insecurity: No Food Insecurity (05/21/2024)   Epic    Worried About Programme Researcher, Broadcasting/film/video in the Last Year: Never true    Ran Out of Food in the Last Year: Never true  Transportation Needs: No Transportation Needs (05/21/2024)   Epic    Lack of Transportation (Medical): No    Lack of Transportation (Non-Medical): No  Physical Activity: Inactive (05/21/2024)   Exercise Vital Sign    Days of Exercise per Week: 0 days    Minutes of Exercise per Session: Not on file  Stress: No Stress Concern Present (05/21/2024)   [employer] of Occupational Health - Occupational Stress Questionnaire    Feeling of Stress: Only a little  Social Connections: Socially Isolated (05/21/2024)   Social Connection and Isolation Panel    Frequency of Communication with Friends and Family: Twice a week    Frequency of Social Gatherings with Friends and Family: More than three times a week    Attends Religious Services: Never    Database Administrator or Organizations: No    Attends Banker Meetings: Not on file    Marital Status: Widowed  Intimate Partner Violence: Not At Risk (12/06/2023)   Humiliation, Afraid, Rape, and Kick questionnaire    Fear of Current or Ex-Partner: No    Emotionally Abused: No    Physically Abused:  No    Sexually Abused: No  Depression (PHQ2-9): Low Risk  (05/28/2024)   Depression (PHQ2-9)    PHQ-2 Score: 1  Alcohol Screen: Low Risk (05/21/2024)   Alcohol Screen    Last Alcohol Screening Score (AUDIT): 2  Housing: Low Risk (05/21/2024)   Epic    Unable to Pay for Housing in the Last Year: No    Number of Times Moved in the Last Year: 0    Homeless in the Last Year: No  Utilities: Not At Risk (12/06/2023)   [facility] Utilities    Threatened with loss of utilities: No  Health Literacy: Adequate Health Literacy (12/06/2023)   B1300 Health Literacy    Frequency of need for help with medical instructions: Never     Family History:   The patient's family history includes Alcohol abuse in her father and sister; Breast cancer in her mother; Cancer in her mother; Dementia in her maternal grandmother; Diabetes in her father; Early death in her mother; Heart disease in her mother and sister; Heart failure in her father; Hypertension in her father; Liver disease in her sister. There is no history of Colon cancer, Esophageal cancer, Rectal cancer, or Stomach cancer.    ROS:  Please see the history of present illness.  All other ROS reviewed and negative.     Physical Exam/Data: Vitals:   08/15/24 2230 08/15/24 2245 08/15/24 2300 08/15/24 2315  BP: (!) 93/58 (!) 104/54 108/67 (!) 114/57  Pulse: (!) 32 (!) 35 (!) 30 (!) 30  Resp: 14 20    Temp:      TempSrc:      SpO2: 98% 100% 100% 99%  Weight:      Height:       No intake or output data in the 24 hours ending 08/15/24 2324    08/15/2024    9:08 PM 05/28/2024   10:52 AM 05/21/2024    8:59 AM  Last 3 Weights  Weight (lbs) 220 lb 216 lb 6.4 oz 212 lb 12.8 oz  Weight (kg) 99.791 kg 98.158 kg 96.525 kg     Body mass index is 37.76 kg/m.    General: Well appearing, NAD HEENT: EOMI, No scleral icterus CV: Regular rate and rhythm. No M/G/R. 2+ radial pulses. Pulmonary: Clear to auscultation bilaterally. Abdomen: Soft, nontender. Extremities: No lower extremity edema. Neuro: Awake, alert.    EKG:  The ECG that was done  21:18 was personally reviewed and demonstrates atrial fibrillation with slow ventricular response, IVCD; remaining EKGs   Relevant CV Studies:  TTE 08/2020:   1. Left ventricular ejection fraction, by estimation, is 60 to 65%. The  left ventricle has normal function. The left ventricle has no regional  wall motion abnormalities. Left ventricular diastolic parameters are  consistent with Grade II diastolic  dysfunction (pseudonormalization). Elevated left atrial pressure.   2. Right ventricular systolic function is normal. The right ventricular  size is normal. There is moderately elevated pulmonary artery systolic  pressure. The estimated right ventricular systolic pressure is 59.9 mmHg.   3. Left atrial size was mildly dilated.   4. The mitral valve is normal in structure. Trivial mitral valve  regurgitation. No evidence of mitral stenosis.   5. Tricuspid valve regurgitation is moderate.   6. The aortic valve is tricuspid. Aortic valve regurgitation is not  visualized. No aortic stenosis is present.   7. The inferior vena cava is normal in size with greater than 50%  respiratory variability, suggesting right atrial pressure  of 3 mmHg.    Laboratory Data: High Sensitivity Troponin:  No results for input(s): TROPONINIHS in the last 720 hours.    Chemistry Recent Labs  Lab 08/15/24 2115  NA 138  K 5.5*  CL 106  CO2 18*  GLUCOSE 136*  BUN 46*  CREATININE 2.27*  CALCIUM 8.7*  MG 2.6*  GFRNONAA 21*  ANIONGAP 15    No results for input(s): PROT, ALBUMIN, AST, ALT, ALKPHOS, BILITOT in the last 168 hours. Lipids No results for input(s): CHOL, TRIG, HDL, LABVLDL, LDLCALC, CHOLHDL in the last 168 hours. Hematology Recent Labs  Lab 08/15/24 2115  WBC 7.2  RBC 3.24*  HGB 10.9*  HCT 32.9*  MCV 101.5*  MCH 33.6  MCHC 33.1  RDW 14.3  PLT 179   Thyroid  No results for input(s): TSH, FREET4 in the last 168  hours. BNPNo results for input(s): BNP, PROBNP in the last 168 hours.  DDimer No results for input(s): DDIMER in the last 168 hours.  Radiology/Studies:  No results found.   Assessment and Plan: Dizziness Dyspnea Bradycardia Persistent atrial fibrillation with slow ventricular response HFpEF, chronic Chronic lymphedema in legs History of SVT CKD HTN Chronic anemia Mild hyperkalemia  She presents with episode of dizziness/dyspnea and lower than usual blood pressure for her (as low as 90/12mmHg). This is in the context of recent oral amiodarone  load while she awaits potential catheter ablation.   -Admit observation, K>2, Mg>4, telemetry/PCU, keep pads near patient, if HR remains low will consider Isuprel -Will plan monitor on telemetry and hold bisoprolol , diltiazem . Continue oral amiodarone  for now -TSH -Continue home Torsemide , appears euvolemic  Risk Assessment/Risk Scores: {Complete the following score calculators/questions to meet required metrics.  Press F2:1}     New York  Heart Association (NYHA) Functional Class NYHA Class II  CHA2DS2-VASc Score = 5  {Confirm score is correct.  If not, click here to update score.  REFRESH note.  :1} This indicates a 7.2% annual risk of stroke. The patient's score is based upon: CHF History: 0 HTN History: 1 Diabetes History: 1 Stroke History: 0 Vascular Disease History: 0 Age Score: 2 Gender Score: 1   {This patient has a significant risk of stroke if diagnosed with atrial fibrillation.  Please consider VKA or DOAC agent for anticoagulation if the bleeding risk is acceptable.   You can also use the SmartPhrase .[email or web address] for documentation.   :[id number]} Code Status: Full Code  Severity of Illness: The appropriate patient status for this patient is OBSERVATION. Observation status is judged to be reasonable and necessary in order to provide the required intensity of service to ensure the patient's safety. The patient's  presenting symptoms, physical exam findings, and initial radiographic and laboratory data in the context of their medical condition is felt to place them at decreased risk for further clinical deterioration. Furthermore, it is anticipated that the patient will be medically stable for discharge from the hospital within 2 midnights of admission.   For questions or updates, please contact [facility] Please consult [email or web address] for contact info under     Signed, Marlin DELENA Rhody, MD  08/15/2024 11:24 PM      [1]  Allergies Allergen Reactions   Amlodipine  Swelling    Swelling    Sulfonamide Derivatives Swelling    REACTION: Swelling of the face; eyes swelled shut   Myrbetriq  [Mirabegron ]     Facial swelling, sob   Chocolate Other (See Comments)    Sinus problems   "

## 2024-08-26 ENCOUNTER — Ambulatory Visit (HOSPITAL_COMMUNITY): Admission: RE | Admit: 2024-08-26 | Source: Home / Self Care | Admitting: Cardiology

## 2024-08-26 ENCOUNTER — Encounter (HOSPITAL_COMMUNITY): Admission: RE | Payer: Self-pay | Source: Home / Self Care

## 2024-08-26 SURGERY — ATRIAL FIBRILLATION ABLATION
Anesthesia: General

## 2024-11-25 ENCOUNTER — Ambulatory Visit: Admitting: Family

## 2024-12-10 ENCOUNTER — Ambulatory Visit
